# Patient Record
Sex: Male | Born: 1950 | Race: White | Hispanic: No | Marital: Single | Smoking: Former smoker
Health system: Southern US, Community
[De-identification: ages and names within clinical notes are randomized; demographics above are authoritative.]

## PROBLEM LIST (undated history)

## (undated) DIAGNOSIS — E119 Type 2 diabetes mellitus without complications: Secondary | ICD-10-CM

## (undated) DIAGNOSIS — I5020 Unspecified systolic (congestive) heart failure: Secondary | ICD-10-CM

## (undated) DIAGNOSIS — I493 Ventricular premature depolarization: Secondary | ICD-10-CM

## (undated) DIAGNOSIS — I82409 Acute embolism and thrombosis of unspecified deep veins of unspecified lower extremity: Secondary | ICD-10-CM

## (undated) DIAGNOSIS — C109 Malignant neoplasm of oropharynx, unspecified: Secondary | ICD-10-CM

## (undated) DIAGNOSIS — I4729 Other ventricular tachycardia: Secondary | ICD-10-CM

## (undated) DIAGNOSIS — I10 Essential (primary) hypertension: Secondary | ICD-10-CM

## (undated) DIAGNOSIS — E785 Hyperlipidemia, unspecified: Secondary | ICD-10-CM

## (undated) DIAGNOSIS — I513 Intracardiac thrombosis, not elsewhere classified: Secondary | ICD-10-CM

## (undated) DIAGNOSIS — I2510 Atherosclerotic heart disease of native coronary artery without angina pectoris: Secondary | ICD-10-CM

## (undated) HISTORY — DX: Ventricular premature depolarization: I49.3

## (undated) HISTORY — DX: Other ventricular tachycardia: I47.29

## (undated) HISTORY — DX: Atherosclerotic heart disease of native coronary artery without angina pectoris: I25.10

## (undated) HISTORY — DX: Intracardiac thrombosis, not elsewhere classified: I51.3

## (undated) HISTORY — PX: CORONARY ANGIOPLASTY WITH STENT PLACEMENT: SHX49

## (undated) HISTORY — PX: SHOULDER SURGERY: SHX246

## (undated) HISTORY — DX: Acute embolism and thrombosis of unspecified deep veins of unspecified lower extremity: I82.409

## (undated) HISTORY — DX: Unspecified systolic (congestive) heart failure: I50.20

## (undated) HISTORY — PX: OTHER SURGICAL HISTORY: SHX169

---

## 2006-05-18 ENCOUNTER — Ambulatory Visit: Payer: Self-pay | Admitting: Specialist

## 2006-05-24 ENCOUNTER — Other Ambulatory Visit: Payer: Self-pay

## 2006-05-30 ENCOUNTER — Ambulatory Visit: Payer: Self-pay | Admitting: Specialist

## 2014-04-01 DIAGNOSIS — I10 Essential (primary) hypertension: Secondary | ICD-10-CM | POA: Insufficient documentation

## 2014-04-01 DIAGNOSIS — E785 Hyperlipidemia, unspecified: Secondary | ICD-10-CM | POA: Insufficient documentation

## 2016-08-15 ENCOUNTER — Encounter: Payer: Self-pay | Admitting: Intensive Care

## 2016-08-15 ENCOUNTER — Emergency Department
Admission: EM | Admit: 2016-08-15 | Discharge: 2016-08-15 | Disposition: A | Discharge status: Home / Self Care | Payer: Worker's Compensation | Attending: Emergency Medicine | Admitting: Emergency Medicine

## 2016-08-15 DIAGNOSIS — S61012A Laceration without foreign body of left thumb without damage to nail, initial encounter: Secondary | ICD-10-CM | POA: Diagnosis not present

## 2016-08-15 DIAGNOSIS — E119 Type 2 diabetes mellitus without complications: Secondary | ICD-10-CM | POA: Diagnosis not present

## 2016-08-15 DIAGNOSIS — W260XXA Contact with knife, initial encounter: Secondary | ICD-10-CM | POA: Insufficient documentation

## 2016-08-15 DIAGNOSIS — Y9389 Activity, other specified: Secondary | ICD-10-CM | POA: Diagnosis not present

## 2016-08-15 DIAGNOSIS — F17290 Nicotine dependence, other tobacco product, uncomplicated: Secondary | ICD-10-CM | POA: Diagnosis not present

## 2016-08-15 DIAGNOSIS — Y929 Unspecified place or not applicable: Secondary | ICD-10-CM | POA: Diagnosis not present

## 2016-08-15 DIAGNOSIS — Z23 Encounter for immunization: Secondary | ICD-10-CM | POA: Insufficient documentation

## 2016-08-15 DIAGNOSIS — I10 Essential (primary) hypertension: Secondary | ICD-10-CM | POA: Diagnosis not present

## 2016-08-15 DIAGNOSIS — Y990 Civilian activity done for income or pay: Secondary | ICD-10-CM | POA: Diagnosis not present

## 2016-08-15 HISTORY — DX: Essential (primary) hypertension: I10

## 2016-08-15 HISTORY — DX: Type 2 diabetes mellitus without complications: E11.9

## 2016-08-15 HISTORY — DX: Hyperlipidemia, unspecified: E78.5

## 2016-08-15 LAB — GLUCOSE, CAPILLARY: Glucose-Capillary: 162 mg/dL — ABNORMAL HIGH (ref 65–99)

## 2016-08-15 MED ORDER — NAPROXEN 500 MG PO TABS: 500.0000 mg | ORAL_TABLET | Freq: Once | ORAL | Status: DC

## 2016-08-15 MED ORDER — TETANUS-DIPHTH-ACELL PERTUSSIS 5-2.5-18.5 LF-MCG/0.5 IM SUSP
0.5000 mL | Freq: Once | INTRAMUSCULAR | Status: AC
  Administered 2016-08-15: 0.5 mL via INTRAMUSCULAR
  Filled 2016-08-15: qty 0.5

## 2016-08-15 NOTE — ED Triage Notes (Addendum)
Patient reports he was using a knife to cut a band off a pressure washer wand and slipped and cut his L hand. Patient works at [employer] and this will be workers comp. Laceration noted to L hand. Radial pulse bounding and cap refil <3 sec. Patient denies taking any OTC meds and denies pain. A&O x4. NAD noted. Patient reports being on blood thinners

## 2016-08-15 NOTE — ED Notes (Signed)
Spoke Molli Barrows, Production designer, theatre/television/film. Stated that pt would need urine drug screen

## 2016-08-15 NOTE — ED Provider Notes (Signed)
[facility] Emergency Department Provider Note  ____________________________________________  Time seen: Approximately 1:51 PM  I have reviewed the triage vital signs and the nursing notes.   HISTORY  Chief Complaint Extremity Laceration (L hand)    HPI Kenneth Gilmore is a 65 y.o. male resents to the emergency department with a laceration over left hand.The knife slipped at work and cut hand. Patient is on blood thinners and they could not get the bleeding to stop so patient came to the emergency room. Patient is able to move fingers and is not having any sensation changes. Patient has not taken anything for pain. Patient denies any other injuries. Patient does not remember last tetanus shot.   Past Medical History:  Diagnosis Date  . Diabetes mellitus without complication ([place])   . Hyperlipidemia   . Hypertension     There are no active problems to display for this patient.   Past Surgical History:  Procedure Laterality Date  . arm surgery Left     Prior to Admission medications   Not on File    Allergies Patient has no known allergies.  History reviewed. No pertinent family history.  Social History Social History  Substance Use Topics  . Smoking status: Never Smoker  . Smokeless tobacco: Current User    Types: Chew  . Alcohol use No     Review of Systems  Constitutional: No fever/chills Cardiovascular: No chest pain. Respiratory: No SOB. Gastrointestinal: No abdominal pain.  No nausea, no vomiting.  Skin: Negative for rash, ecchymosis. Neurological: Negative for numbness or tingling   ____________________________________________   PHYSICAL EXAM:  VITAL SIGNS: ED Triage Vitals  Enc Vitals Group     BP 08/15/16 1242 130/70     Pulse Rate 08/15/16 1242 74     Resp 08/15/16 1242 18     Temp 08/15/16 1242 98.5 F (36.9 C)     Temp Source 08/15/16 1242 Oral     SpO2 08/15/16 1242 96 %     Weight 08/15/16 1243 235 lb  (106.6 kg)     Height 08/15/16 1243 5\' 10"  (1.778 m)     Head Circumference --      Peak Flow --      Pain Score --      Pain Loc --      Pain Edu? --      Excl. in [place]? --      Constitutional: Alert and oriented. Well appearing and in no acute distress. Eyes: Conjunctivae are normal. PERRL. EOMI. Head: Atraumatic. ENT:      Ears:      Nose: No congestion/rhinnorhea.      Mouth/Throat: Mucous membranes are moist.  Neck: No stridor.   Cardiovascular: Normal rate, regular rhythm. Normal S1 and S2.  Good peripheral circulation. Respiratory: Normal respiratory effort without tachypnea or retractions. Lungs CTAB. Good air entry to the bases with no decreased or absent breath sounds. Musculoskeletal: Full range of motion to all extremities. No gross deformities appreciated. Neurologic:  Normal speech and language. No gross focal neurologic deficits are appreciated.  Skin:  Skin is warm, dry. No rash noted. 1/2 inch shallow laceration over the base of left thumb. No bleeding. Psychiatric: Mood and affect are normal. Speech and behavior are normal. Patient exhibits appropriate insight and judgement.   ____________________________________________   LABS (all labs ordered are listed, but only abnormal results are displayed)  Labs Reviewed  GLUCOSE, CAPILLARY - Abnormal; Notable for the following:  Result Value   Glucose-Capillary 162 (*)    All other components within normal limits   ____________________________________________  EKG   ____________________________________________  RADIOLOGY  No results found.  ____________________________________________    PROCEDURES  Procedure(s) performed:    Procedures  Dermabond and Steri-Strips were applied.  Medications  naproxen (NAPROSYN) tablet 500 mg (500 mg Oral Refused 08/15/16 1435)  Tdap (BOOSTRIX) injection 0.5 mL (0.5 mLs Intramuscular Given 08/15/16 1414)      ____________________________________________   INITIAL IMPRESSION / ASSESSMENT AND PLAN / ED COURSE  Pertinent labs & imaging results that were available during my care of the patient were reviewed by me and considered in my medical decision making (see chart for details).  Review of the [place] CSRS was performed in accordance of the [place] prior to dispensing any controlled drugs.  Clinical Course     Patient's diagnosis is consistent with laceration. Laceration was very shallow so no indication for stitches. Wound was irrigated with normal saline and iodine. Dermabond and Steri-Strips were applied. Wound was wrapped with ace bandage for swelling. Splint was applied to keep laceration from opening. Tetanus shot was updated. Patient is to follow up with PCP as directed. Patient is given ED precautions to return to the ED for any worsening or new symptoms.     ____________________________________________  FINAL CLINICAL IMPRESSION(S) / ED DIAGNOSES  Final diagnoses:  Laceration of left thumb without damage to nail, foreign body presence unspecified, initial encounter      NEW MEDICATIONS STARTED DURING THIS VISIT:  There are no discharge medications for this patient.       This chart was dictated using voice recognition software/Dragon. Despite best efforts to proofread, errors can occur which can change the meaning. Any change was purely unintentional.    Laban Emperor, PA-C 08/15/16 [place], MD 08/15/16 2019

## 2016-08-15 NOTE — ED Notes (Signed)
Pt has small  approx 1 inch laceration to his left hand below his thumb. Pt is on blood thinners. Bleeding is under control at this time.

## 2016-08-15 NOTE — ED Notes (Signed)
UDS for workers comp completed and taken to lab pre policy.

## 2018-02-06 ENCOUNTER — Other Ambulatory Visit: Payer: Self-pay | Admitting: Physician Assistant

## 2018-02-06 ENCOUNTER — Emergency Department
Admission: EM | Admit: 2018-02-06 | Discharge: 2018-02-06 | Disposition: A | Discharge status: Home / Self Care | Payer: Medicare Other | Attending: Emergency Medicine | Admitting: Emergency Medicine

## 2018-02-06 ENCOUNTER — Encounter: Payer: Self-pay | Admitting: Intensive Care

## 2018-02-06 ENCOUNTER — Ambulatory Visit
Admission: RE | Admit: 2018-02-06 | Discharge: 2018-02-06 | Disposition: A | Discharge status: Home / Self Care | Payer: Medicare Other | Source: Ambulatory Visit | Attending: Physician Assistant | Admitting: Physician Assistant

## 2018-02-06 DIAGNOSIS — I82412 Acute embolism and thrombosis of left femoral vein: Secondary | ICD-10-CM | POA: Diagnosis not present

## 2018-02-06 DIAGNOSIS — E119 Type 2 diabetes mellitus without complications: Secondary | ICD-10-CM | POA: Insufficient documentation

## 2018-02-06 DIAGNOSIS — I10 Essential (primary) hypertension: Secondary | ICD-10-CM | POA: Diagnosis not present

## 2018-02-06 DIAGNOSIS — M7989 Other specified soft tissue disorders: Principal | ICD-10-CM

## 2018-02-06 DIAGNOSIS — R2242 Localized swelling, mass and lump, left lower limb: Secondary | ICD-10-CM | POA: Diagnosis present

## 2018-02-06 DIAGNOSIS — M79662 Pain in left lower leg: Secondary | ICD-10-CM

## 2018-02-06 DIAGNOSIS — Z7982 Long term (current) use of aspirin: Secondary | ICD-10-CM | POA: Diagnosis not present

## 2018-02-06 DIAGNOSIS — I824Z2 Acute embolism and thrombosis of unspecified deep veins of left distal lower extremity: Secondary | ICD-10-CM | POA: Insufficient documentation

## 2018-02-06 LAB — CBC WITH DIFFERENTIAL/PLATELET
BASOS ABS: 0.2 10*3/uL — AB (ref 0–0.1)
BASOS PCT: 1 %
EOS ABS: 0.3 10*3/uL (ref 0–0.7)
Eosinophils Relative: 3 %
HCT: 44.8 % (ref 40.0–52.0)
Hemoglobin: 15.4 g/dL (ref 13.0–18.0)
LYMPHS PCT: 30 %
Lymphs Abs: 3.6 10*3/uL (ref 1.0–3.6)
MCH: 32.7 pg (ref 26.0–34.0)
MCHC: 34.4 g/dL (ref 32.0–36.0)
MCV: 95.2 fL (ref 80.0–100.0)
Monocytes Absolute: 1.2 10*3/uL — ABNORMAL HIGH (ref 0.2–1.0)
Monocytes Relative: 10 %
Neutro Abs: 6.8 10*3/uL — ABNORMAL HIGH (ref 1.4–6.5)
Neutrophils Relative %: 56 %
PLATELETS: 229 10*3/uL (ref 150–440)
RBC: 4.7 MIL/uL (ref 4.40–5.90)
RDW: 13.2 % (ref 11.5–14.5)
WBC: 12.1 10*3/uL — AB (ref 3.8–10.6)

## 2018-02-06 LAB — COMPREHENSIVE METABOLIC PANEL
ALBUMIN: 4.1 g/dL (ref 3.5–5.0)
ALT: 19 U/L (ref 17–63)
AST: 20 U/L (ref 15–41)
Alkaline Phosphatase: 60 U/L (ref 38–126)
Anion gap: 12 (ref 5–15)
BUN: 20 mg/dL (ref 6–20)
CHLORIDE: 103 mmol/L (ref 101–111)
CO2: 24 mmol/L (ref 22–32)
CREATININE: 1.04 mg/dL (ref 0.61–1.24)
Calcium: 9.1 mg/dL (ref 8.9–10.3)
GFR calc Af Amer: >60 mL/min (ref >60)
GLUCOSE: 132 mg/dL — AB (ref 65–99)
Potassium: 4.3 mmol/L (ref 3.5–5.1)
SODIUM: 139 mmol/L (ref 135–145)
Total Bilirubin: 0.7 mg/dL (ref 0.3–1.2)
Total Protein: 7.3 g/dL (ref 6.5–8.1)

## 2018-02-06 LAB — APTT: aPTT: 27 seconds (ref 24–36)

## 2018-02-06 LAB — PROTIME-INR
INR: 0.99
PROTHROMBIN TIME: 13 s (ref 11.4–15.2)

## 2018-02-06 MED ORDER — RIVAROXABAN (XARELTO) VTE STARTER PACK (15 & 20 MG): ORAL_TABLET | ORAL | 0 refills | Status: DC

## 2018-02-06 MED ORDER — RIVAROXABAN 15 MG PO TABS
15.0000 mg | ORAL_TABLET | Freq: Once | ORAL | Status: AC
  Administered 2018-02-06: 15 mg via ORAL
  Filled 2018-02-06 (×2): qty 1

## 2018-02-06 NOTE — ED Notes (Signed)
Pt discharged home after verbalizing understanding of discharge instructions; nad noted. 

## 2018-02-06 NOTE — Discharge Instructions (Signed)
Please begin taking your anticoagulation as prescribed and make an appointment to follow-up with the hematologist within 1 week for reevaluation.  Return to the emergency department immediately for any new or worsening symptoms such as chest pain, shortness of breath, or for any other issues whatsoever.  It was a pleasure to take care of you today, and thank you for coming to our emergency department.  If you have any questions or concerns before leaving please ask the nurse to grab me and I'm more than happy to go through your aftercare instructions again.  If you were prescribed any opioid pain medication today such as Norco, Vicodin, Percocet, morphine, hydrocodone, or oxycodone please make sure you do not drive when you are taking this medication as it can alter your ability to drive safely.  If you have any concerns once you are home that you are not improving or are in fact getting worse before you can make it to your follow-up appointment, please do not hesitate to call 911 and come back for further evaluation.  Darel Hong, MD  Results for orders placed or performed during the hospital encounter of 02/06/18  CBC with Differential  Result Value Ref Range   WBC 12.1 (H) 3.8 - 10.6 K/uL   RBC 4.70 4.40 - 5.90 MIL/uL   Hemoglobin 15.4 13.0 - 18.0 g/dL   HCT 44.8 40.0 - 52.0 %   MCV 95.2 80.0 - 100.0 fL   MCH 32.7 26.0 - 34.0 pg   MCHC 34.4 32.0 - 36.0 g/dL   RDW 13.2 11.5 - 14.5 %   Platelets 229 150 - 440 K/uL   Neutrophils Relative % 56 %   Neutro Abs 6.8 (H) 1.4 - 6.5 K/uL   Lymphocytes Relative 30 %   Lymphs Abs 3.6 1.0 - 3.6 K/uL   Monocytes Relative 10 %   Monocytes Absolute 1.2 (H) 0.2 - 1.0 K/uL   Eosinophils Relative 3 %   Eosinophils Absolute 0.3 0 - 0.7 K/uL   Basophils Relative 1 %   Basophils Absolute 0.2 (H) 0 - 0.1 K/uL  Comprehensive metabolic panel  Result Value Ref Range   Sodium 139 135 - 145 mmol/L   Potassium 4.3 3.5 - 5.1 mmol/L   Chloride 103 101 -  111 mmol/L   CO2 24 22 - 32 mmol/L   Glucose, Bld 132 (H) 65 - 99 mg/dL   BUN 20 6 - 20 mg/dL   Creatinine, Ser 1.04 0.61 - 1.24 mg/dL   Calcium 9.1 8.9 - 10.3 mg/dL   Total Protein 7.3 6.5 - 8.1 g/dL   Albumin 4.1 3.5 - 5.0 g/dL   AST 20 15 - 41 U/L   ALT 19 17 - 63 U/L   Alkaline Phosphatase 60 38 - 126 U/L   Total Bilirubin 0.7 0.3 - 1.2 mg/dL   GFR calc non Af Amer >60 >60 mL/min   GFR calc Af Amer >60 >60 mL/min   Anion gap 12 5 - 15  Protime-INR  Result Value Ref Range   Prothrombin Time 13.0 11.4 - 15.2 seconds   INR 0.99   APTT  Result Value Ref Range   aPTT 27 24 - 36 seconds   US Venous Img Lower Unilateral Left  Result Date: 02/06/2018 CLINICAL DATA:  Left leg pain and swelling. EXAM: LEFT LOWER EXTREMITY VENOUS DOPPLER ULTRASOUND TECHNIQUE: Gray-scale sonography with graded compression, as well as color Doppler and duplex ultrasound were performed to evaluate the lower extremity deep venous systems  from the level of the common femoral vein and including the common femoral, femoral, profunda femoral, popliteal and calf veins including the posterior tibial, peroneal and gastrocnemius veins when visible. The superficial great saphenous vein was also interrogated. Spectral Doppler was utilized to evaluate flow at rest and with distal augmentation maneuvers in the common femoral, femoral and popliteal veins. COMPARISON:  None. FINDINGS: Contralateral Common Femoral Vein: No evidence of thrombus. Normal compressibility and color Doppler flow. Common Femoral Vein: No evidence of thrombus. Normal compressibility, respiratory phasicity and response to augmentation. Saphenofemoral Junction: No evidence of thrombus. Normal compressibility and flow on color Doppler imaging. Profunda Femoral Vein: No evidence of thrombus. Normal compressibility and flow on color Doppler imaging. Femoral Vein: Positive for thrombus. The left femoral vein is non compressible and contains echogenic thrombus.  Thrombus appears to be occlusive. Popliteal Vein: Positive for thrombus. Left popliteal vein is non compressible with echogenic thrombus. No significant vascular flow within the popliteal vein. Calf Veins: Positive for thrombus. The peroneal veins and posterior tibial veins are non compressible and contain echogenic thrombus. Evidence for thrombus within the gastrocnemius veins. Other Findings:  None. IMPRESSION: Positive for deep venous thrombosis in the left lower extremity. Occlusive thrombus involving the left femoral vein, left popliteal vein and visualized left deep calf veins. Electronically Signed   By: Markus Daft M.D.   On: 02/06/2018 15:42

## 2018-02-06 NOTE — ED Triage Notes (Signed)
Patient was brought over from [place] for positive blood clot present in L leg. Patient was being seen out patient at the time.

## 2018-02-06 NOTE — ED Notes (Signed)
Pt from outpt ultrasound with blood clot in left leg. Pain 2/10.

## 2018-02-06 NOTE — ED Provider Notes (Signed)
[facility] Emergency Department Provider Note  ____________________________________________   First MD Initiated Contact with Patient 02/06/18 1712     (approximate)  I have reviewed the triage vital signs and the nursing notes.   HISTORY  Chief Complaint No chief complaint on file.   HPI Kenneth Gilmore is a 67 y.o. male who sent to the emergency department after having an outpatient ultrasound of his left lower extremity which was positive for deep vein thrombosis.  He has no previous history of DVT or pulmonary embolism.  No recent surgery travel or immobilization.  No recent trauma.  He takes a baby aspirin a day but no blood thinning medication and never has.  He has had swelling to his left leg for the past week or so.  Also associated with moderate severity throbbing aching discomfort.  Nothing seems to make the pain better or worse.  Past Medical History:  Diagnosis Date  . Diabetes mellitus without complication ([place])   . Hyperlipidemia   . Hypertension     There are no active problems to display for this patient.   Past Surgical History:  Procedure Laterality Date  . arm surgery Left     Prior to Admission medications   Medication Sig Start Date End Date Taking? Authorizing Provider  Rivaroxaban 15 & 20 MG TBPK Take as directed on package: Start with one 15mg  tablet by mouth twice a day with food. On Day 22, switch to one 20mg  tablet once a day with food. 02/06/18   Darel Hong, MD    Allergies Patient has no known allergies.  History reviewed. No pertinent family history.  Social History Social History   Tobacco Use  . Smoking status: Never Smoker  . Smokeless tobacco: Current User    Types: Chew  Substance Use Topics  . Alcohol use: No  . Drug use: Never    Review of Systems Constitutional: No fever/chills ENT: No sore throat. Cardiovascular: Denies chest pain. Respiratory: Denies shortness of  breath. Gastrointestinal: No abdominal pain.  No nausea, no vomiting.  No diarrhea.  No constipation. Musculoskeletal: Positive for leg pain Neurological: Negative for headaches   ____________________________________________   PHYSICAL EXAM:  VITAL SIGNS: ED Triage Vitals  Enc Vitals Group     BP 02/06/18 1606 123/68     Pulse Rate 02/06/18 1606 78     Resp 02/06/18 1606 16     Temp 02/06/18 1606 98.3 F (36.8 C)     Temp Source 02/06/18 1606 Oral     SpO2 02/06/18 1606 96 %     Weight 02/06/18 1607 225 lb (102.1 kg)     Height 02/06/18 1607 5\' 10"  (1.778 m)     Head Circumference --      Peak Flow --      Pain Score 02/06/18 1607 2     Pain Loc --      Pain Edu? --      Excl. in [place]? --     Constitutional: Alert and oriented x4 well-appearing nontoxic no diaphoresis speaks full clear sentences Head: Atraumatic. Nose: No congestion/rhinnorhea. Mouth/Throat: No trismus Neck: No stridor.   Cardiovascular: Regular rate and rhythm Respiratory: Normal respiratory effort.  No retractions. MSK: Left leg slightly swollen.  2+ dorsalis pedis pulse.  Compartments soft. Neurologic:  Normal speech and language. No gross focal neurologic deficits are appreciated.  Skin:  Skin is warm, dry and intact. No rash noted.    ____________________________________________  LABS (all labs  ordered are listed, but only abnormal results are displayed)  Labs Reviewed  CBC WITH DIFFERENTIAL/PLATELET - Abnormal; Notable for the following components:      Result Value   WBC 12.1 (*)    Neutro Abs 6.8 (*)    Monocytes Absolute 1.2 (*)    Basophils Absolute 0.2 (*)    All other components within normal limits  COMPREHENSIVE METABOLIC PANEL - Abnormal; Notable for the following components:   Glucose, Bld 132 (*)    All other components within normal limits  PROTIME-INR  APTT    Lab work reviewed by me with no renal  dysfunction __________________________________________  EKG   ____________________________________________  RADIOLOGY  Ultrasound of the left lower extremity reviewed by me with deep vein thrombosis ____________________________________________   DIFFERENTIAL includes but not limited to  DVT, phlegmasia cerulea dolens, pulmonary embolism   PROCEDURES  Procedure(s) performed: no  Procedures  Critical Care performed: no  Observation: no ____________________________________________   INITIAL IMPRESSION / ASSESSMENT AND PLAN / ED COURSE  Pertinent labs & imaging results that were available during my care of the patient were reviewed by me and considered in my medical decision making (see chart for details).  Patient arrives hemodynamically stable and well-appearing.  He does have a large DVT on the left.  He has no signs of phlegmasia.  No chest pain or shortness of breath and I doubt pulmonary embolism.  This appears to be potentially an unprovoked DVT so I did warn the patient that he could require prolonged anticoagulation or potentially for life.  I checked his insurance and it appears to cover rivaroxaban.  Given a first dose now and I will discharge him with a starter pack and heme-onc follow-up.  Strict return precautions have been given and the patient verbalizes understanding agreement with plan.      ____________________________________________   FINAL CLINICAL IMPRESSION(S) / ED DIAGNOSES  Final diagnoses:  Acute deep vein thrombosis (DVT) of femoral vein of left lower extremity ([place])      NEW MEDICATIONS STARTED DURING THIS VISIT:  Discharge Medication List as of 02/06/2018  5:26 PM    START taking these medications   Details  Rivaroxaban 15 & 20 MG TBPK Take as directed on package: Start with one 15mg  tablet by mouth twice a day with food. On Day 22, switch to one 20mg  tablet once a day with food., Print         Note:  This document was prepared  using Dragon voice recognition software and may include unintentional dictation errors.      Darel Hong, MD 02/07/18 1026

## 2018-02-10 ENCOUNTER — Other Ambulatory Visit: Payer: Self-pay

## 2018-02-10 ENCOUNTER — Encounter: Payer: Self-pay | Admitting: Oncology

## 2018-02-10 ENCOUNTER — Inpatient Hospital Stay: Payer: Medicare Other | Attending: Oncology | Admitting: Oncology

## 2018-02-10 VITALS — BP 108/70 | HR 65 | Temp 97.1°F | Resp 18 | Ht 71.0 in | Wt 224.3 lb

## 2018-02-10 DIAGNOSIS — Z955 Presence of coronary angioplasty implant and graft: Secondary | ICD-10-CM | POA: Insufficient documentation

## 2018-02-10 DIAGNOSIS — I82412 Acute embolism and thrombosis of left femoral vein: Secondary | ICD-10-CM

## 2018-02-10 DIAGNOSIS — Z7901 Long term (current) use of anticoagulants: Secondary | ICD-10-CM | POA: Diagnosis not present

## 2018-02-10 DIAGNOSIS — D72829 Elevated white blood cell count, unspecified: Secondary | ICD-10-CM | POA: Insufficient documentation

## 2018-02-10 NOTE — Progress Notes (Signed)
Patient here today for initial evaluation.  

## 2018-02-10 NOTE — Progress Notes (Signed)
Hematology/Oncology Consult note [facility] [phone] [phone]   Patient Care Team: Georgiana Shore., PA-C as PCP - General  REFERRING PROVIDER: Emergency Room Physician  CHIEF COMPLAINTS/REASON FOR VISIT:  Evaluation of DVT  HISTORY OF PRESENTING ILLNESS:  Kenneth Gilmore is a  67 y.o.  male with PMH listed below who was referred to me for evaluation of DVT. Patient presented outpatient ultrasound of his left lower extremity showing positive for DVT. Denies any recent,, surgery, travel or issue.  Denies any family history of DVT.  Reports that he developed left lower extremity swelling for a week, associated with moderate severity throbbing aching discomfort feels like pulling a muscle.  Denies any shortness of breath. Patient was started on Xarelto starting kit and advised to follow-up with heme-onc.  Today patient reports being compliant with taking Xarelto.  Currently taking 15 mg twice daily. Tolerates well.Denies hematochezia, hematuria, hematemesis, epistaxis, black tarry stool or easy bruising.    Review of Systems  Constitutional: Negative for chills, fever, malaise/fatigue and weight loss.  HENT: Negative for congestion, ear discharge, ear pain, nosebleeds, sinus pain and sore throat.   Eyes: Negative for double vision, photophobia, pain, discharge and redness.  Respiratory: Negative for cough, hemoptysis, sputum production, shortness of breath and wheezing.   Cardiovascular: Negative for chest pain, palpitations, orthopnea, claudication and leg swelling.  Gastrointestinal: Negative for abdominal pain, blood in stool, constipation, diarrhea, heartburn, melena, nausea and vomiting.  Genitourinary: Negative for dysuria, flank pain, frequency and hematuria.  Musculoskeletal: Negative for back pain, myalgias and neck pain.  Skin: Negative for itching and rash.  Neurological: Negative for dizziness, tingling, tremors, focal  weakness, weakness and headaches.  Endo/Heme/Allergies: Negative for environmental allergies. Does not bruise/bleed easily.  Psychiatric/Behavioral: Negative for depression and hallucinations. The patient is not nervous/anxious.     MEDICAL HISTORY:  Past Medical History:  Diagnosis Date  . Diabetes mellitus without complication ([place])   . Hyperlipidemia   . Hypertension     SURGICAL HISTORY: Past Surgical History:  Procedure Laterality Date  . arm surgery Left   . CORONARY ANGIOPLASTY WITH STENT PLACEMENT    . left side rotator cuff surgery    . SHOULDER SURGERY      SOCIAL HISTORY: Social History   Socioeconomic History  . Marital status: Single    Spouse name: Not on file  . Number of children: Not on file  . Years of education: Not on file  . Highest education level: Not on file  Occupational History  . Occupation: retired  Scientific laboratory technician  . Financial resource strain: Not on file  . Food insecurity:    Worry: Not on file    Inability: Not on file  . Transportation needs:    Medical: Not on file    Non-medical: Not on file  Tobacco Use  . Smoking status: Never Smoker  . Smokeless tobacco: Current User    Types: Chew  Substance and Sexual Activity  . Alcohol use: No  . Drug use: Never  . Sexual activity: Not on file  Lifestyle  . Physical activity:    Days per week: Not on file    Minutes per session: Not on file  . Stress: Not on file  Relationships  . Social connections:    Talks on phone: Not on file    Gets together: Not on file    Attends religious service: Not on file    Active member of club or organization:  Not on file    Attends meetings of clubs or organizations: Not on file    Relationship status: Not on file  . Intimate partner violence:    Fear of current or ex partner: Not on file    Emotionally abused: Not on file    Physically abused: Not on file    Forced sexual activity: Not on file  Other Topics Concern  . Not on file  Social  History Narrative  . Not on file    FAMILY HISTORY: Family History  Problem Relation Age of Onset  . Cancer Father        lung    ALLERGIES:  has No Known Allergies.  MEDICATIONS:  Current Outpatient Medications  Medication Sig Dispense Refill  . aspirin EC 325 MG tablet Take by mouth. Take 381m by mouth once daily    . lisinopril (PRINIVIL,ZESTRIL) 5 MG tablet Take by mouth. Take 1 tab by mouth once daily    . metFORMIN (GLUCOPHAGE) 500 MG tablet Take 500 mg by mouth 2 (two) times daily.    . metoprolol tartrate (LOPRESSOR) 50 MG tablet Take 50 mg by mouth 2 (two) times daily.    . Rivaroxaban 15 & 20 MG TBPK Take as directed on package: Start with one 126mtablet by mouth twice a day with food. On Day 22, switch to one 2017mablet once a day with food. 51 each 0  . simvastatin (ZOCOR) 40 MG tablet Take 40 mg by mouth daily.     No current facility-administered medications for this visit.      PHYSICAL EXAMINATION: ECOG PERFORMANCE STATUS: 0 - Asymptomatic Vitals:   02/10/18 1041  BP: 108/70  Pulse: 65  Resp: 18  Temp: (!) 97.1 F (36.2 C)   Filed Weights   02/10/18 1041  Weight: 224 lb 4.8 oz (101.7 kg)    Physical Exam  Constitutional: He is oriented to person, place, and time. He appears well-developed and well-nourished. No distress.  HENT:  Head: Normocephalic and atraumatic.  Right Ear: External ear normal.  Left Ear: External ear normal.  Mouth/Throat: Oropharynx is clear and moist.  Eyes: Pupils are equal, round, and reactive to light. Conjunctivae and EOM are normal. No scleral icterus.  Neck: Normal range of motion. Neck supple.  Cardiovascular: Normal rate, regular rhythm and normal heart sounds.  Pulmonary/Chest: Effort normal and breath sounds normal. No respiratory distress. He has no wheezes. He has no rales. He exhibits no tenderness.  Abdominal: Soft. Bowel sounds are normal. He exhibits no distension and no mass. There is no tenderness.    Musculoskeletal: Normal range of motion. He exhibits no deformity.  Left lower extremity +1 edema  Lymphadenopathy:    He has no cervical adenopathy.  Neurological: He is alert and oriented to person, place, and time. No cranial nerve deficit. Coordination normal.  Skin: Skin is warm and dry. No rash noted.  Psychiatric: He has a normal mood and affect. His behavior is normal. Thought content normal.     LABORATORY DATA:  I have reviewed the data as listed Lab Results  Component Value Date   WBC 12.1 (H) 02/06/2018   HGB 15.4 02/06/2018   HCT 44.8 02/06/2018   MCV 95.2 02/06/2018   PLT 229 02/06/2018   Recent Labs    02/06/18 1620  NA 139  K 4.3  CL 103  CO2 24  GLUCOSE 132*  BUN 20  CREATININE 1.04  CALCIUM 9.1  GFRNONAA >60  GFRAA >60  PROT 7.3  ALBUMIN 4.1  AST 20  ALT 19  ALKPHOS 60  BILITOT 0.7   Iron/TIBC/Ferritin/ %Sat No results found for: IRON, TIBC, FERRITIN, IRONPCTSAT      ASSESSMENT & PLAN:  1. Acute deep vein thrombosis (DVT) of femoral vein of left lower extremity (HCC)    Ultrasound of left lower extremity was and discussed with patient.  Ultrasound showed deep vein thrombosis in left lower extremity, occlusive thrombus involving the left femoral, left popliteal and visualized calf veins. He has a proximal lower extremity.  I agree with patient with Xarelto.  Discussed with patient that given that it is an unprovoked DVT, Recommend duration of active elevation at long time.  Patient voices understanding and agreed with the plan.  He reports also taking aspirin.  He has remote history of coronary angioplasty with stent placement in 2000. I suggest patient be can stop taking aspirin since he is now on therapeutic dose of Xarelto. Also advised patient to ask cardiologist and clarify.  Will not do any hypercoagulable work-up in acute setting and results of hypercoagulable work-up does not change his management plan. I will order factor V Leiden  mutation and prothrombin gene mutation at the next visit.  Reviewed patient's CBC on 02/06/2018.  He has mild leukocytosis with a WBC of 12.1, predominantly neutrophilia 0.8 ANC, basophilia 0.2 We will continue to monitor.  Repeat CBC in 3 months.   Orders Placed This Encounter  Procedures  . CBC with Differential/Platelet    Standing Status:   Future    Standing Expiration Date:   02/11/2019  . Comprehensive metabolic panel    Standing Status:   Future    Standing Expiration Date:   02/11/2019  . Factor 5 leiden    Standing Status:   Future    Standing Expiration Date:   02/11/2019  . Prothrombin gene mutation    Standing Status:   Future    Standing Expiration Date:   02/11/2019    All questions were answered. The patient knows to call the clinic with any problems questions or concerns.  Return of visit:  Thank you for this kind referral and the opportunity to participate in the care of this patient. A copy of today's note is routed to referring provider  Total face to face encounter time for this patient visit was 45 min. >50% of the time was  spent in counseling and coordination of care.    Earlie Server, MD, PhD Hematology Oncology [facility] at [facility] Pager- [phone] 02/10/2018

## 2018-05-11 ENCOUNTER — Other Ambulatory Visit: Payer: Self-pay

## 2018-05-11 ENCOUNTER — Inpatient Hospital Stay (HOSPITAL_BASED_OUTPATIENT_CLINIC_OR_DEPARTMENT_OTHER): Payer: Medicare Other | Admitting: Oncology

## 2018-05-11 ENCOUNTER — Encounter: Payer: Self-pay | Admitting: Oncology

## 2018-05-11 ENCOUNTER — Inpatient Hospital Stay: Payer: Medicare Other | Attending: Oncology

## 2018-05-11 VITALS — BP 114/76 | HR 69 | Temp 97.8°F | Resp 18 | Wt 223.0 lb

## 2018-05-11 DIAGNOSIS — Z7982 Long term (current) use of aspirin: Secondary | ICD-10-CM

## 2018-05-11 DIAGNOSIS — Z86718 Personal history of other venous thrombosis and embolism: Secondary | ICD-10-CM

## 2018-05-11 DIAGNOSIS — D72829 Elevated white blood cell count, unspecified: Secondary | ICD-10-CM

## 2018-05-11 DIAGNOSIS — I82412 Acute embolism and thrombosis of left femoral vein: Secondary | ICD-10-CM

## 2018-05-11 DIAGNOSIS — Z7901 Long term (current) use of anticoagulants: Secondary | ICD-10-CM

## 2018-05-11 DIAGNOSIS — I2510 Atherosclerotic heart disease of native coronary artery without angina pectoris: Secondary | ICD-10-CM | POA: Insufficient documentation

## 2018-05-11 LAB — CBC WITH DIFFERENTIAL/PLATELET
BASOS PCT: 1 %
Basophils Absolute: 0.1 10*3/uL (ref 0–0.1)
EOS ABS: 0.2 10*3/uL (ref 0–0.7)
EOS PCT: 2 %
HCT: 45.9 % (ref 40.0–52.0)
HEMOGLOBIN: 15.8 g/dL (ref 13.0–18.0)
Lymphocytes Relative: 39 %
Lymphs Abs: 3.9 10*3/uL — ABNORMAL HIGH (ref 1.0–3.6)
MCH: 32.6 pg (ref 26.0–34.0)
MCHC: 34.4 g/dL (ref 32.0–36.0)
MCV: 94.8 fL (ref 80.0–100.0)
Monocytes Absolute: 0.8 10*3/uL (ref 0.2–1.0)
Monocytes Relative: 8 %
NEUTROS PCT: 50 %
Neutro Abs: 5.1 10*3/uL (ref 1.4–6.5)
PLATELETS: 252 10*3/uL (ref 150–440)
RBC: 4.84 MIL/uL (ref 4.40–5.90)
RDW: 13.4 % (ref 11.5–14.5)
WBC: 10.1 10*3/uL (ref 3.8–10.6)

## 2018-05-11 LAB — COMPREHENSIVE METABOLIC PANEL
ALK PHOS: 45 U/L (ref 38–126)
ALT: 19 U/L (ref 0–44)
ANION GAP: 11 (ref 5–15)
AST: 23 U/L (ref 15–41)
Albumin: 4.3 g/dL (ref 3.5–5.0)
BILIRUBIN TOTAL: 0.5 mg/dL (ref 0.3–1.2)
BUN: 15 mg/dL (ref 8–23)
CO2: 26 mmol/L (ref 22–32)
CREATININE: 0.96 mg/dL (ref 0.61–1.24)
Calcium: 9 mg/dL (ref 8.9–10.3)
Chloride: 103 mmol/L (ref 98–111)
GFR calc Af Amer: >60 mL/min (ref >60)
Glucose, Bld: 113 mg/dL — ABNORMAL HIGH (ref 70–99)
POTASSIUM: 4 mmol/L (ref 3.5–5.1)
Sodium: 140 mmol/L (ref 135–145)
Total Protein: 7 g/dL (ref 6.5–8.1)

## 2018-05-11 NOTE — Progress Notes (Signed)
Hematology/Oncology follow up  note [facility] Telephone:([phone] [phone]) [phone]   Patient Care Team: Georgiana Shore., PA-C as PCP - General  REFERRING PROVIDER: Emergency Room Physician  REASON FOR VISIT Follow up for treatment of  DVT and chronic anticoagulation  HISTORY OF PRESENTING ILLNESS:  Kenneth Gilmore is a  67 y.o.  male with PMH listed below who was referred to me for evaluation of DVT. Patient presented outpatient ultrasound of his left lower extremity showing positive for DVT. Denies any recent,, surgery, travel or issue.  Denies any family history of DVT.  Reports that he developed left lower extremity swelling for a week, associated with moderate severity throbbing aching discomfort feels like pulling a muscle.  Denies any shortness of breath. Patient was started on Xarelto starting kit and advised to follow-up with heme-onc.  Today patient reports being compliant with taking Xarelto.  Currently taking 15 mg twice daily. Tolerates well.Denies hematochezia, hematuria, hematemesis, epistaxis, black tarry stool or easy bruising.   INTERVAL HISTORY Kenneth Gilmore is a 67 y.o. male who has above history reviewed by me today presents for follow up visit for management of DVT and chronic anticoagulation.  Given that he has an unprovoked proximal lower extremity DVT, it was discussed that he needs long-term anticoagulation. Reports tolerating anticoagulation well.Denies hematochezia, hematuria, hematemesis, epistaxis, black tarry stool or easy bruising.  Left lower extremity swelling has improved, still has mild edema, worst at the end of the day.  No aggravating factors.  Review of Systems  Constitutional: Negative for chills, fever, malaise/fatigue and weight loss.  HENT: Negative for congestion, ear discharge, ear pain, nosebleeds, sinus pain and sore throat.   Eyes: Negative for double vision, photophobia, pain, discharge and redness.    Respiratory: Negative for cough, hemoptysis, sputum production, shortness of breath and wheezing.   Cardiovascular: Negative for chest pain, palpitations, orthopnea, claudication and leg swelling.  Gastrointestinal: Negative for abdominal pain, blood in stool, constipation, diarrhea, heartburn, melena, nausea and vomiting.  Genitourinary: Negative for dysuria, flank pain, frequency and hematuria.  Musculoskeletal: Negative for back pain, myalgias and neck pain.  Skin: Negative for itching and rash.  Neurological: Negative for dizziness, tingling, tremors, focal weakness, weakness and headaches.  Endo/Heme/Allergies: Negative for environmental allergies. Does not bruise/bleed easily.  Psychiatric/Behavioral: Negative for depression and hallucinations. The patient is not nervous/anxious.     MEDICAL HISTORY:  Past Medical History:  Diagnosis Date  . Diabetes mellitus without complication ([place])   . Hyperlipidemia   . Hypertension     SURGICAL HISTORY: Past Surgical History:  Procedure Laterality Date  . arm surgery Left   . CORONARY ANGIOPLASTY WITH STENT PLACEMENT    . left side rotator cuff surgery    . SHOULDER SURGERY      SOCIAL HISTORY: Social History   Socioeconomic History  . Marital status: Single    Spouse name: Not on file  . Number of children: Not on file  . Years of education: Not on file  . Highest education level: Not on file  Occupational History  . Occupation: retired  Scientific laboratory technician  . Financial resource strain: Not on file  . Food insecurity:    Worry: Not on file    Inability: Not on file  . Transportation needs:    Medical: Not on file    Non-medical: Not on file  Tobacco Use  . Smoking status: Never Smoker  . Smokeless tobacco: Current User    Types: Chew  Substance and Sexual Activity  . Alcohol use: No  . Drug use: Never  . Sexual activity: Not on file  Lifestyle  . Physical activity:    Days per week: Not on file    Minutes per session:  Not on file  . Stress: Not on file  Relationships  . Social connections:    Talks on phone: Not on file    Gets together: Not on file    Attends religious service: Not on file    Active member of club or organization: Not on file    Attends meetings of clubs or organizations: Not on file    Relationship status: Not on file  . Intimate partner violence:    Fear of current or ex partner: Not on file    Emotionally abused: Not on file    Physically abused: Not on file    Forced sexual activity: Not on file  Other Topics Concern  . Not on file  Social History Narrative  . Not on file    FAMILY HISTORY: Family History  Problem Relation Age of Onset  . Cancer Father        lung    ALLERGIES:  has No Known Allergies.  MEDICATIONS:  Current Outpatient Medications  Medication Sig Dispense Refill  . aspirin EC 325 MG tablet Take by mouth. Take 372m by mouth once daily    . lisinopril (PRINIVIL,ZESTRIL) 5 MG tablet Take by mouth. Take 1 tab by mouth once daily    . metFORMIN (GLUCOPHAGE) 500 MG tablet Take 500 mg by mouth 2 (two) times daily.    . metoprolol tartrate (LOPRESSOR) 50 MG tablet Take 50 mg by mouth 2 (two) times daily.    . Rivaroxaban 15 & 20 MG TBPK Take as directed on package: Start with one 191mtablet by mouth twice a day with food. On Day 22, switch to one 2050mablet once a day with food. 51 each 0  . simvastatin (ZOCOR) 40 MG tablet Take 40 mg by mouth daily.     No current facility-administered medications for this visit.      PHYSICAL EXAMINATION: ECOG PERFORMANCE STATUS: 0 - Asymptomatic Vitals:   05/11/18 1339  BP: 114/76  Pulse: 69  Resp: 18  Temp: 97.8 F (36.6 C)   Filed Weights   05/11/18 1339  Weight: 223 lb (101.2 kg)    Physical Exam  Constitutional: He is oriented to person, place, and time. He appears well-nourished. No distress.  HENT:  Head: Normocephalic and atraumatic.  Mouth/Throat: Oropharynx is clear and moist.  Eyes:  Pupils are equal, round, and reactive to light. Conjunctivae and EOM are normal. No scleral icterus.  Neck: Normal range of motion. Neck supple.  Cardiovascular: Normal rate, regular rhythm and normal heart sounds.  Pulmonary/Chest: Effort normal and breath sounds normal. No respiratory distress. He has no wheezes. He has no rales. He exhibits no tenderness.  Abdominal: Soft. Bowel sounds are normal. He exhibits no distension and no mass. There is no tenderness.  Musculoskeletal: Normal range of motion. He exhibits no edema or deformity.  Left lower extremity 1+ edema  Lymphadenopathy:    He has no cervical adenopathy.  Neurological: He is alert and oriented to person, place, and time. No cranial nerve deficit. Coordination normal.  Skin: Skin is warm and dry. No rash noted. No erythema.  Psychiatric: He has a normal mood and affect. His behavior is normal. Thought content normal.     LABORATORY DATA:  I have reviewed the data as listed Lab Results  Component Value Date   WBC 12.1 (H) 02/06/2018   HGB 15.4 02/06/2018   HCT 44.8 02/06/2018   MCV 95.2 02/06/2018   PLT 229 02/06/2018   Recent Labs    02/06/18 1620  NA 139  K 4.3  CL 103  CO2 24  GLUCOSE 132*  BUN 20  CREATININE 1.04  CALCIUM 9.1  GFRNONAA >60  GFRAA >60  PROT 7.3  ALBUMIN 4.1  AST 20  ALT 19  ALKPHOS 60  BILITOT 0.7   Iron/TIBC/Ferritin/ %Sat No results found for: IRON, TIBC, FERRITIN, IRONPCTSAT      ASSESSMENT & PLAN:  1. History of femoral vein thrombosis   2. Leukocytosis, unspecified type   3. Chronic anticoagulation    #Recent history of femoral vein thrombosis unprovoked events. Tolerates anticoagulation well.  Continue Xarelto 20 mg daily. Offered patient to refill his prescription.  Patient tells me that he has been getting his Xarelto through [place] system.  #history of CAD status post percutaneous coronary angioplasty.  Patient had been on aspirin 325 mg.  Patient was seen by Dr. Ubaldo Glassing  in July 2019.  Dr. Ubaldo Glassing prefers patient to continue with aspirin despite being on Xarelto.  Patient appears little confused about the instructions of aspirin  I have talked with Dr.Fath over the phone and discussed his case. Dr.Fath prefers patient to stay on Aspirin, dose can be lowered to 28m daily. RN/CMA will call patient to clarify.    #He has mild leukocytosis in June 2019.  Repeat CBC was obtained today. Labs reviewed and discussed with patient.  Leukocytosis has resolved.  Total white count 10.1.  Absolute lymphocytes 3.9 mildly elevated.  Continue monitor.  Repeat CBC in 3 months.   Orders Placed This Encounter  Procedures  . CBC with Differential/Platelet    Standing Status:   Future    Standing Expiration Date:   05/12/2019  . Creatinine, serum    Standing Status:   Future    Standing Expiration Date:   05/12/2019    All questions were answered. The patient knows to call the clinic with any problems questions or concerns.  Return of visit: 3 months. Total face to face encounter time for this patient visit was 81 min. >50% of the time was  spent in counseling and coordination of care.   ZEarlie Server MD, PhD Hematology On[facility]ager- [phone]/19/2019

## 2018-05-11 NOTE — Progress Notes (Signed)
Patient here for follow up, no concerns voiced

## 2018-05-17 LAB — FACTOR 5 LEIDEN

## 2018-05-18 LAB — PROTHROMBIN GENE MUTATION

## 2018-08-10 ENCOUNTER — Other Ambulatory Visit: Payer: Self-pay

## 2018-08-10 ENCOUNTER — Encounter: Payer: Self-pay | Admitting: Oncology

## 2018-08-10 ENCOUNTER — Inpatient Hospital Stay (HOSPITAL_BASED_OUTPATIENT_CLINIC_OR_DEPARTMENT_OTHER): Payer: Medicare Other | Admitting: Oncology

## 2018-08-10 ENCOUNTER — Inpatient Hospital Stay: Payer: Medicare Other | Attending: Oncology

## 2018-08-10 VITALS — BP 112/73 | HR 70 | Temp 97.2°F | Resp 18 | Wt 218.5 lb

## 2018-08-10 DIAGNOSIS — Z79899 Other long term (current) drug therapy: Secondary | ICD-10-CM

## 2018-08-10 DIAGNOSIS — E119 Type 2 diabetes mellitus without complications: Secondary | ICD-10-CM

## 2018-08-10 DIAGNOSIS — Z7982 Long term (current) use of aspirin: Secondary | ICD-10-CM

## 2018-08-10 DIAGNOSIS — I10 Essential (primary) hypertension: Secondary | ICD-10-CM | POA: Diagnosis not present

## 2018-08-10 DIAGNOSIS — Z86718 Personal history of other venous thrombosis and embolism: Secondary | ICD-10-CM

## 2018-08-10 DIAGNOSIS — Z7984 Long term (current) use of oral hypoglycemic drugs: Secondary | ICD-10-CM | POA: Insufficient documentation

## 2018-08-10 DIAGNOSIS — Z7901 Long term (current) use of anticoagulants: Secondary | ICD-10-CM

## 2018-08-10 DIAGNOSIS — D72829 Elevated white blood cell count, unspecified: Secondary | ICD-10-CM

## 2018-08-10 LAB — CBC WITH DIFFERENTIAL/PLATELET
Abs Immature Granulocytes: 0.04 10*3/uL (ref 0.00–0.07)
BASOS PCT: 1 %
Basophils Absolute: 0.1 10*3/uL (ref 0.0–0.1)
EOS PCT: 2 %
Eosinophils Absolute: 0.2 10*3/uL (ref 0.0–0.5)
HCT: 47.4 % (ref 39.0–52.0)
HEMOGLOBIN: 16 g/dL (ref 13.0–17.0)
Immature Granulocytes: 0 %
LYMPHS PCT: 38 %
Lymphs Abs: 3.9 10*3/uL (ref 0.7–4.0)
MCH: 31 pg (ref 26.0–34.0)
MCHC: 33.8 g/dL (ref 30.0–36.0)
MCV: 91.9 fL (ref 80.0–100.0)
Monocytes Absolute: 0.9 10*3/uL (ref 0.1–1.0)
Monocytes Relative: 9 %
Neutro Abs: 5.2 10*3/uL (ref 1.7–7.7)
Neutrophils Relative %: 50 %
Platelets: 249 10*3/uL (ref 150–400)
RBC: 5.16 MIL/uL (ref 4.22–5.81)
RDW: 12.2 % (ref 11.5–15.5)
WBC: 10.3 10*3/uL (ref 4.0–10.5)
nRBC: 0 % (ref 0.0–0.2)

## 2018-08-10 LAB — CREATININE, SERUM
CREATININE: 1.01 mg/dL (ref 0.61–1.24)
GFR calc Af Amer: >60 mL/min (ref >60)
GFR calc non Af Amer: >60 mL/min (ref >60)

## 2018-08-10 NOTE — Progress Notes (Signed)
Patient here for follow up. No concerns voiced.  °

## 2018-08-12 NOTE — Progress Notes (Signed)
Hematology/Oncology follow up  note [facility] [phone] [phone]   Patient Care Team: Georgiana Shore., PA-C as PCP - General  REFERRING PROVIDER: Emergency Room Physician  REASON FOR VISIT Follow up for treatment of  DVT and chronic anticoagulation  HISTORY OF PRESENTING ILLNESS:  Kenneth Gilmore is a  67 y.o.  male with PMH listed below who was referred to me for evaluation of DVT. Patient presented outpatient ultrasound of his left lower extremity showing positive for DVT. Denies any recent,, surgery, travel or issue.  Denies any family history of DVT.  Reports that he developed left lower extremity swelling for a week, associated with moderate severity throbbing aching discomfort feels like pulling a muscle.  Denies any shortness of breath. Patient was started on Xarelto starting kit and advised to follow-up with heme-onc.  Today patient reports being compliant with taking Xarelto.  Currently taking 15 mg twice daily. Tolerates well.Denies hematochezia, hematuria, hematemesis, epistaxis, black tarry stool or easy bruising.   INTERVAL HISTORY Kenneth Gilmore is a 67 y.o. 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   He has finished 6 months of anticoagulation with Xarelto 20 mg daily.  Tolerates well. Denies hematochezia, hematuria, hematemesis, epistaxis, black tarry stool or easy bruising.  Lower extremity swelling has resolved.  Review of Systems  Constitutional: Negative for chills, fever, malaise/fatigue and weight loss.  HENT: Negative for sore throat.   Eyes: Negative for redness.  Respiratory: Negative for cough, shortness of breath and wheezing.   Cardiovascular: Negative for chest pain, palpitations and leg swelling.  Gastrointestinal: Negative for abdominal pain, blood in stool, nausea and  vomiting.  Genitourinary: Negative for dysuria.  Musculoskeletal: Negative for myalgias.  Skin: Negative for rash.  Neurological: Negative for dizziness, tingling and tremors.  Endo/Heme/Allergies: Does not bruise/bleed easily.  Psychiatric/Behavioral: Negative for hallucinations.    MEDICAL HISTORY:  Past Medical History:  Diagnosis Date  . Diabetes mellitus without complication ([place])   . Hyperlipidemia   . Hypertension     SURGICAL HISTORY: Past Surgical History:  Procedure Laterality Date  . arm surgery Left   . CORONARY ANGIOPLASTY WITH STENT PLACEMENT    . left side rotator cuff surgery    . SHOULDER SURGERY      SOCIAL HISTORY: Social History   Socioeconomic History  . Marital status: Single    Spouse name: Not on file  . Number of children: Not on file  . Years of education: Not on file  . Highest education level: Not on file  Occupational History  . Occupation: retired  Scientific laboratory technician  . Financial resource strain: Not on file  . Food insecurity:    Worry: Not on file    Inability: Not on file  . Transportation needs:    Medical: Not on file    Non-medical: Not on file  Tobacco Use  . Smoking status: Never Smoker  . Smokeless tobacco: Current User    Types: Chew  Substance and Sexual Activity  . Alcohol use: No  . Drug use: Never  . Sexual activity: Not on file  Lifestyle  . Physical activity:    Days per week: Not on file    Minutes per session: Not on file  . Stress: Not on file  Relationships  . Social connections:    Talks on phone: Not on file  Gets together: Not on file    Attends religious service: Not on file    Active member of club or organization: Not on file    Attends meetings of clubs or organizations: Not on file    Relationship status: Not on file  . Intimate partner violence:    Fear of current or ex partner: Not on file    Emotionally abused: Not on file    Physically abused: Not on file    Forced sexual activity: Not on  file  Other Topics Concern  . Not on file  Social History Narrative  . Not on file    FAMILY HISTORY: Family History  Problem Relation Age of Onset  . Cancer Father        lung    ALLERGIES:  has No Known Allergies.  MEDICATIONS:  Current Outpatient Medications  Medication Sig Dispense Refill  . aspirin 81 MG tablet Take 81 mg by mouth daily.    Marland Kitchen lisinopril (PRINIVIL,ZESTRIL) 5 MG tablet Take by mouth. Take 1 tab by mouth once daily    . metFORMIN (GLUCOPHAGE) 500 MG tablet Take 500 mg by mouth 2 (two) times daily.    . metoprolol tartrate (LOPRESSOR) 50 MG tablet Take 50 mg by mouth 2 (two) times daily.    . Rivaroxaban 15 & 20 MG TBPK Take as directed on package: Start with one 82m tablet by mouth twice a day with food. On Day 22, switch to one 281mtablet once a day with food. 51 each 0  . simvastatin (ZOCOR) 40 MG tablet Take 40 mg by mouth daily.     No current facility-administered medications for this visit.      PHYSICAL EXAMINATION: ECOG PERFORMANCE STATUS: 0 - Asymptomatic Vitals:   08/10/18 1339  BP: 112/73  Pulse: 70  Resp: 18  Temp: (!) 97.2 F (36.2 C)   Filed Weights   08/10/18 1339  Weight: 218 lb 8 oz (99.1 kg)    Physical Exam Constitutional:      General: He is not in acute distress. HENT:     Head: Normocephalic and atraumatic.  Eyes:     General: No scleral icterus.    Conjunctiva/sclera: Conjunctivae normal.     Pupils: Pupils are equal, round, and reactive to light.  Neck:     Musculoskeletal: Normal range of motion and neck supple.  Cardiovascular:     Rate and Rhythm: Normal rate and regular rhythm.     Heart sounds: Normal heart sounds.  Pulmonary:     Effort: Pulmonary effort is normal. No respiratory distress.     Breath sounds: Normal breath sounds. No wheezing or rales.  Chest:     Chest wall: No tenderness.  Abdominal:     General: Bowel sounds are normal. There is no distension.     Palpations: Abdomen is soft. There  is no mass.     Tenderness: There is no abdominal tenderness.  Musculoskeletal: Normal range of motion.        General: No swelling or deformity.  Lymphadenopathy:     Cervical: No cervical adenopathy.  Skin:    General: Skin is warm and dry.     Findings: No erythema or rash.  Neurological:     Mental Status: He is alert and oriented to person, place, and time.     Cranial Nerves: No cranial nerve deficit.     Coordination: Coordination normal.  Psychiatric:        Behavior: Behavior normal.  Thought Content: Thought content normal.      LABORATORY DATA:  I have reviewed the data as listed Lab Results  Component Value Date   WBC 10.3 08/10/2018   HGB 16.0 08/10/2018   HCT 47.4 08/10/2018   MCV 91.9 08/10/2018   PLT 249 08/10/2018   Recent Labs    02/06/18 1620 05/11/18 1319 08/10/18 1321  NA 139 140  --   K 4.3 4.0  --   CL 103 103  --   CO2 24 26  --   GLUCOSE 132* 113*  --   BUN 20 15  --   CREATININE 1.04 0.96 1.01  CALCIUM 9.1 9.0  --   GFRNONAA >60 >60 >60  GFRAA >60 >60 >60  PROT 7.3 7.0  --   ALBUMIN 4.1 4.3  --   AST 20 23  --   ALT 19 19  --   ALKPHOS 60 45  --   BILITOT 0.7 0.5  --    Iron/TIBC/Ferritin/ %Sat No results found for: IRON, TIBC, FERRITIN, IRONPCTSAT      ASSESSMENT & PLAN:  1. History of femoral vein thrombosis   2. Chronic anticoagulation    #History of femoral vein thrombosis unprovoked  Labs are reviewed.  Tolerating anticoagulation well.  Stable kidney function. Patient has finished 6 months of full strength anticoagulation with Xarelto 20 mg daily. Discussed with patient about switching to Xarelto 10 mg daily as maintenance.  Duration long-term. Of the fill his prescription.  Patient tells me that he is PCP at [facility] as already discussed with him about switching to low-dose Xarelto and he will get a new prescription from [place] system..   #history of CAD status post percutaneous coronary angioplasty.  Continue  aspirin 81 mg.  Previously discussed with patient's cardiologist.     Orders Placed This Encounter  Procedures  . CBC with Differential/Platelet    Standing Status:   Future    Standing Expiration Date:   08/10/2019  . Comprehensive metabolic panel    Standing Status:   Future    Standing Expiration Date:   08/10/2019    All questions were answered. The patient knows to call the clinic with any problems questions or concerns.  Return of visit: 4 months Total face to face encounter time for this patient visit was 15 min. >50% of the time was  spent in counseling and coordination of care.  Earlie Server, MD, PhD Hematology Oncology [facility] at [facility] Pager- [phone] 08/12/2018

## 2018-12-13 ENCOUNTER — Ambulatory Visit: Payer: Self-pay | Admitting: Oncology

## 2018-12-13 ENCOUNTER — Other Ambulatory Visit: Payer: Self-pay

## 2019-01-10 ENCOUNTER — Other Ambulatory Visit: Payer: Self-pay

## 2019-01-11 ENCOUNTER — Other Ambulatory Visit: Payer: Self-pay

## 2019-01-11 ENCOUNTER — Inpatient Hospital Stay: Payer: Medicare Other | Attending: Oncology | Admitting: *Deleted

## 2019-01-11 DIAGNOSIS — Z955 Presence of coronary angioplasty implant and graft: Secondary | ICD-10-CM | POA: Diagnosis not present

## 2019-01-11 DIAGNOSIS — Z801 Family history of malignant neoplasm of trachea, bronchus and lung: Secondary | ICD-10-CM | POA: Diagnosis not present

## 2019-01-11 DIAGNOSIS — Z7984 Long term (current) use of oral hypoglycemic drugs: Secondary | ICD-10-CM | POA: Insufficient documentation

## 2019-01-11 DIAGNOSIS — E119 Type 2 diabetes mellitus without complications: Secondary | ICD-10-CM | POA: Insufficient documentation

## 2019-01-11 DIAGNOSIS — E785 Hyperlipidemia, unspecified: Secondary | ICD-10-CM | POA: Diagnosis not present

## 2019-01-11 DIAGNOSIS — Z7982 Long term (current) use of aspirin: Secondary | ICD-10-CM | POA: Diagnosis not present

## 2019-01-11 DIAGNOSIS — Z79899 Other long term (current) drug therapy: Secondary | ICD-10-CM | POA: Diagnosis not present

## 2019-01-11 DIAGNOSIS — I2510 Atherosclerotic heart disease of native coronary artery without angina pectoris: Secondary | ICD-10-CM | POA: Diagnosis not present

## 2019-01-11 DIAGNOSIS — Z7901 Long term (current) use of anticoagulants: Secondary | ICD-10-CM | POA: Diagnosis not present

## 2019-01-11 DIAGNOSIS — Z86718 Personal history of other venous thrombosis and embolism: Secondary | ICD-10-CM | POA: Insufficient documentation

## 2019-01-11 DIAGNOSIS — I10 Essential (primary) hypertension: Secondary | ICD-10-CM | POA: Insufficient documentation

## 2019-01-11 LAB — CBC WITH DIFFERENTIAL/PLATELET
Abs Immature Granulocytes: 0.04 10*3/uL (ref 0.00–0.07)
Basophils Absolute: 0.1 10*3/uL (ref 0.0–0.1)
Basophils Relative: 1 %
Eosinophils Absolute: 0.2 10*3/uL (ref 0.0–0.5)
Eosinophils Relative: 2 %
HCT: 46.2 % (ref 39.0–52.0)
Hemoglobin: 16 g/dL (ref 13.0–17.0)
Immature Granulocytes: 0 %
Lymphocytes Relative: 35 %
Lymphs Abs: 3.4 10*3/uL (ref 0.7–4.0)
MCH: 31.8 pg (ref 26.0–34.0)
MCHC: 34.6 g/dL (ref 30.0–36.0)
MCV: 91.8 fL (ref 80.0–100.0)
Monocytes Absolute: 0.8 10*3/uL (ref 0.1–1.0)
Monocytes Relative: 8 %
Neutro Abs: 5.2 10*3/uL (ref 1.7–7.7)
Neutrophils Relative %: 54 %
Platelets: 239 10*3/uL (ref 150–400)
RBC: 5.03 MIL/uL (ref 4.22–5.81)
RDW: 12.1 % (ref 11.5–15.5)
WBC: 9.7 10*3/uL (ref 4.0–10.5)
nRBC: 0 % (ref 0.0–0.2)

## 2019-01-11 LAB — COMPREHENSIVE METABOLIC PANEL
ALT: 20 U/L (ref 0–44)
AST: 29 U/L (ref 15–41)
Albumin: 4.2 g/dL (ref 3.5–5.0)
Alkaline Phosphatase: 53 U/L (ref 38–126)
Anion gap: 11 (ref 5–15)
BUN: 16 mg/dL (ref 8–23)
CO2: 24 mmol/L (ref 22–32)
Calcium: 9 mg/dL (ref 8.9–10.3)
Chloride: 105 mmol/L (ref 98–111)
Creatinine, Ser: 1.03 mg/dL (ref 0.61–1.24)
GFR calc Af Amer: >60 mL/min (ref >60)
GFR calc non Af Amer: >60 mL/min (ref >60)
Glucose, Bld: 140 mg/dL — ABNORMAL HIGH (ref 70–99)
Potassium: 4 mmol/L (ref 3.5–5.1)
Sodium: 140 mmol/L (ref 135–145)
Total Bilirubin: 0.8 mg/dL (ref 0.3–1.2)
Total Protein: 7.1 g/dL (ref 6.5–8.1)

## 2019-01-12 ENCOUNTER — Other Ambulatory Visit: Payer: Self-pay

## 2019-01-12 ENCOUNTER — Encounter: Payer: Self-pay | Admitting: Oncology

## 2019-01-12 ENCOUNTER — Inpatient Hospital Stay (HOSPITAL_BASED_OUTPATIENT_CLINIC_OR_DEPARTMENT_OTHER): Payer: Medicare Other | Admitting: Oncology

## 2019-01-12 DIAGNOSIS — E119 Type 2 diabetes mellitus without complications: Secondary | ICD-10-CM

## 2019-01-12 DIAGNOSIS — Z86718 Personal history of other venous thrombosis and embolism: Secondary | ICD-10-CM

## 2019-01-12 DIAGNOSIS — Z7901 Long term (current) use of anticoagulants: Secondary | ICD-10-CM | POA: Diagnosis not present

## 2019-01-12 DIAGNOSIS — Z7982 Long term (current) use of aspirin: Secondary | ICD-10-CM

## 2019-01-12 DIAGNOSIS — Z79899 Other long term (current) drug therapy: Secondary | ICD-10-CM

## 2019-01-12 DIAGNOSIS — I10 Essential (primary) hypertension: Secondary | ICD-10-CM | POA: Diagnosis not present

## 2019-01-12 NOTE — Progress Notes (Signed)
HEMATOLOGY-ONCOLOGY TeleHEALTH VISIT PROGRESS NOTE  I connected with Kenneth Gilmore on 01/12/19 at 10:15 AM EDT by video enabled telemedicine visit and verified that I am speaking with the correct person using two identifiers. I discussed the limitations, risks, security and privacy concerns of performing an evaluation and management service by telemedicine and the availability of in-person appointments. I also discussed with the patient that there may be a patient responsible charge related to this service. The patient expressed understanding and agreed to proceed.   Other persons participating in the visit and their role in the encounter:  Geraldine Solar, CMA, check in patient     Patient's location: Home  Provider's location: work Chief Complaint: Follow-up for management of DVT and anticoagulation.   INTERVAL HISTORY Kenneth Gilmore is a 68 y.o. male who has above history reviewed by me today presents for follow up visit for management of DVT and anticoagulation. Problems and complaints are listed below:  Patient has history of femoral vein thrombosis, which is unprovoked.  This that she has made to continue chronic anticoagulation. He has finished 6 months of full strength anticoagulation with Xarelto 20 mg daily and during last visit, dose was reduced to Xarelto 10 mg daily as maintenance. Today patient reports feeling well.  Lower extremity swelling has completely resolved.  Occasionally he has some cramps. He also has a history of CAD status post percutaneous coronary angioplasty.  Per my discussion with his cardiologist, aspirin 81 mg is continued.   Review of Systems  Constitutional: Positive for fatigue. Negative for appetite change, chills, fever and unexpected weight change.  HENT:   Negative for hearing loss and voice change.   Eyes: Negative for eye problems and icterus.  Respiratory: Negative for chest tightness, cough and shortness of breath.   Cardiovascular: Negative  for chest pain and leg swelling.  Gastrointestinal: Negative for abdominal distention and abdominal pain.  Endocrine: Negative for hot flashes.  Genitourinary: Negative for difficulty urinating, dysuria and frequency.   Musculoskeletal: Negative for arthralgias.  Skin: Negative for itching and rash.  Neurological: Negative for light-headedness and numbness.  Hematological: Negative for adenopathy. Does not bruise/bleed easily.  Psychiatric/Behavioral: Negative for confusion.    Past Medical History:  Diagnosis Date  . Diabetes mellitus without complication ([place])   . Hyperlipidemia   . Hypertension    Past Surgical History:  Procedure Laterality Date  . arm surgery Left   . CORONARY ANGIOPLASTY WITH STENT PLACEMENT    . left side rotator cuff surgery    . SHOULDER SURGERY      Family History  Problem Relation Age of Onset  . Cancer Father        lung    Social History   Socioeconomic History  . Marital status: Single    Spouse name: Not on file  . Number of children: Not on file  . Years of education: Not on file  . Highest education level: Not on file  Occupational History  . Occupation: retired  Scientific laboratory technician  . Financial resource strain: Not on file  . Food insecurity:    Worry: Not on file    Inability: Not on file  . Transportation needs:    Medical: Not on file    Non-medical: Not on file  Tobacco Use  . Smoking status: Never Smoker  . Smokeless tobacco: Current User    Types: Chew  Substance and Sexual Activity  . Alcohol use: No  . Drug use: Never  . Sexual  activity: Not on file  Lifestyle  . Physical activity:    Days per week: Not on file    Minutes per session: Not on file  . Stress: Not on file  Relationships  . Social connections:    Talks on phone: Not on file    Gets together: Not on file    Attends religious service: Not on file    Active member of club or organization: Not on file    Attends meetings of clubs or organizations: Not on  file    Relationship status: Not on file  . Intimate partner violence:    Fear of current or ex partner: Not on file    Emotionally abused: Not on file    Physically abused: Not on file    Forced sexual activity: Not on file  Other Topics Concern  . Not on file  Social History Narrative  . Not on file    Current Outpatient Medications on File Prior to Visit  Medication Sig Dispense Refill  . aspirin 81 MG tablet Take 81 mg by mouth daily.    Marland Kitchen lisinopril (PRINIVIL,ZESTRIL) 5 MG tablet Take by mouth. Take 1 tab by mouth once daily    . metFORMIN (GLUCOPHAGE) 500 MG tablet Take 500 mg by mouth 2 (two) times daily.    . metoprolol tartrate (LOPRESSOR) 50 MG tablet Take 50 mg by mouth 2 (two) times daily.    . Rivaroxaban 15 & 20 MG TBPK Take as directed on package: Start with one 15mg  tablet by mouth twice a day with food. On Day 22, switch to one 20mg  tablet once a day with food. 51 each 0  . simvastatin (ZOCOR) 40 MG tablet Take 40 mg by mouth daily.     No current facility-administered medications on file prior to visit.     No Known Allergies     Observations/Objective: There were no vitals filed for this visit. There is no height or weight on file to calculate BMI.  Physical Exam  Constitutional: He is oriented to person, place, and time. No distress.  HENT:  Head: Normocephalic and atraumatic.  Pulmonary/Chest: Effort normal.  Neurological: He is alert and oriented to person, place, and time.  Psychiatric: Affect normal.    CBC    Component Value Date/Time   WBC 9.7 01/11/2019 1056   RBC 5.03 01/11/2019 1056   HGB 16.0 01/11/2019 1056   HCT 46.2 01/11/2019 1056   PLT 239 01/11/2019 1056   MCV 91.8 01/11/2019 1056   MCH 31.8 01/11/2019 1056   MCHC 34.6 01/11/2019 1056   RDW 12.1 01/11/2019 1056   LYMPHSABS 3.4 01/11/2019 1056   MONOABS 0.8 01/11/2019 1056   EOSABS 0.2 01/11/2019 1056   BASOSABS 0.1 01/11/2019 1056    CMP     Component Value Date/Time   NA  140 01/11/2019 1056   K 4.0 01/11/2019 1056   CL 105 01/11/2019 1056   CO2 24 01/11/2019 1056   GLUCOSE 140 (H) 01/11/2019 1056   BUN 16 01/11/2019 1056   CREATININE 1.03 01/11/2019 1056   CALCIUM 9.0 01/11/2019 1056   PROT 7.1 01/11/2019 1056   ALBUMIN 4.2 01/11/2019 1056   AST 29 01/11/2019 1056   ALT 20 01/11/2019 1056   ALKPHOS 53 01/11/2019 1056   BILITOT 0.8 01/11/2019 1056   GFRNONAA >60 01/11/2019 1056   GFRAA >60 01/11/2019 1056     Assessment and Plan: 1. History of femoral vein thrombosis   2. Chronic anticoagulation  Labs are reviewed and discussed with patient.  Stable kidney function. Tolerating Xarelto 10 mg daily.  No bleeding problems. Recommend continue chronic anticoagulation maintenance with Xarelto 10 mg daily.  Patient need to follow-up with cardiologist for history of CAD status post percutaneous coronary angioplasty to determine if he needs to continue 81 mg aspirin.  Follow Up Instructions: Lab MD assessment in 6 months. Orders Placed This Encounter  Procedures  . CBC with Differential/Platelet    Standing Status:   Future    Standing Expiration Date:   01/12/2020  . Comprehensive metabolic panel    Standing Status:   Future    Standing Expiration Date:   01/12/2020     I discussed the assessment and treatment plan with the patient. The patient was provided an opportunity to ask questions and all were answered. The patient agreed with the plan and demonstrated an understanding of the instructions.  The patient was advised to call back or seek an in-person evaluation if the symptoms worsen or if the condition fails to improve as anticipated.    Earlie Server, MD 01/12/2019 6:37 PM

## 2019-01-12 NOTE — Progress Notes (Signed)
Called patient today for Telehealth visit via [place].  Patient states no new concerns today.  Patient states "i'm feeling pretty good"

## 2019-04-17 ENCOUNTER — Other Ambulatory Visit: Payer: Medicare Other

## 2019-04-17 ENCOUNTER — Ambulatory Visit: Payer: Medicare Other | Admitting: Oncology

## 2019-07-16 ENCOUNTER — Ambulatory Visit: Payer: Medicare Other | Admitting: Oncology

## 2019-07-16 ENCOUNTER — Other Ambulatory Visit: Payer: Medicare Other

## 2019-07-24 ENCOUNTER — Encounter: Payer: Self-pay | Admitting: Oncology

## 2019-07-24 ENCOUNTER — Other Ambulatory Visit: Payer: Self-pay

## 2019-07-24 ENCOUNTER — Inpatient Hospital Stay: Payer: Medicare Other | Attending: Oncology

## 2019-07-24 DIAGNOSIS — I10 Essential (primary) hypertension: Secondary | ICD-10-CM | POA: Insufficient documentation

## 2019-07-24 DIAGNOSIS — I2510 Atherosclerotic heart disease of native coronary artery without angina pectoris: Secondary | ICD-10-CM | POA: Diagnosis not present

## 2019-07-24 DIAGNOSIS — Z955 Presence of coronary angioplasty implant and graft: Secondary | ICD-10-CM | POA: Diagnosis not present

## 2019-07-24 DIAGNOSIS — Z7984 Long term (current) use of oral hypoglycemic drugs: Secondary | ICD-10-CM | POA: Diagnosis not present

## 2019-07-24 DIAGNOSIS — E785 Hyperlipidemia, unspecified: Secondary | ICD-10-CM | POA: Diagnosis not present

## 2019-07-24 DIAGNOSIS — Z86718 Personal history of other venous thrombosis and embolism: Secondary | ICD-10-CM | POA: Diagnosis present

## 2019-07-24 DIAGNOSIS — E119 Type 2 diabetes mellitus without complications: Secondary | ICD-10-CM | POA: Insufficient documentation

## 2019-07-24 DIAGNOSIS — Z7901 Long term (current) use of anticoagulants: Secondary | ICD-10-CM | POA: Diagnosis not present

## 2019-07-24 DIAGNOSIS — Z7982 Long term (current) use of aspirin: Secondary | ICD-10-CM | POA: Insufficient documentation

## 2019-07-24 DIAGNOSIS — Z79899 Other long term (current) drug therapy: Secondary | ICD-10-CM | POA: Insufficient documentation

## 2019-07-24 LAB — CBC WITH DIFFERENTIAL/PLATELET
Abs Immature Granulocytes: 0.03 10*3/uL (ref 0.00–0.07)
Basophils Absolute: 0.1 10*3/uL (ref 0.0–0.1)
Basophils Relative: 1 %
Eosinophils Absolute: 0.3 10*3/uL (ref 0.0–0.5)
Eosinophils Relative: 3 %
HCT: 47.8 % (ref 39.0–52.0)
Hemoglobin: 15.7 g/dL (ref 13.0–17.0)
Immature Granulocytes: 0 %
Lymphocytes Relative: 41 %
Lymphs Abs: 3.6 10*3/uL (ref 0.7–4.0)
MCH: 31.7 pg (ref 26.0–34.0)
MCHC: 32.8 g/dL (ref 30.0–36.0)
MCV: 96.6 fL (ref 80.0–100.0)
Monocytes Absolute: 0.7 10*3/uL (ref 0.1–1.0)
Monocytes Relative: 8 %
Neutro Abs: 4.1 10*3/uL (ref 1.7–7.7)
Neutrophils Relative %: 47 %
Platelets: 231 10*3/uL (ref 150–400)
RBC: 4.95 MIL/uL (ref 4.22–5.81)
RDW: 12.7 % (ref 11.5–15.5)
WBC: 8.8 10*3/uL (ref 4.0–10.5)
nRBC: 0 % (ref 0.0–0.2)

## 2019-07-24 LAB — COMPREHENSIVE METABOLIC PANEL
ALT: 18 U/L (ref 0–44)
AST: 18 U/L (ref 15–41)
Albumin: 4.3 g/dL (ref 3.5–5.0)
Alkaline Phosphatase: 43 U/L (ref 38–126)
Anion gap: 9 (ref 5–15)
BUN: 16 mg/dL (ref 8–23)
CO2: 28 mmol/L (ref 22–32)
Calcium: 9.2 mg/dL (ref 8.9–10.3)
Chloride: 102 mmol/L (ref 98–111)
Creatinine, Ser: 0.85 mg/dL (ref 0.61–1.24)
GFR calc Af Amer: >60 mL/min (ref >60)
GFR calc non Af Amer: >60 mL/min (ref >60)
Glucose, Bld: 143 mg/dL — ABNORMAL HIGH (ref 70–99)
Potassium: 4 mmol/L (ref 3.5–5.1)
Sodium: 139 mmol/L (ref 135–145)
Total Bilirubin: 0.5 mg/dL (ref 0.3–1.2)
Total Protein: 7.3 g/dL (ref 6.5–8.1)

## 2019-07-24 NOTE — Progress Notes (Signed)
Patient pre-screened for telehealth visit. He states he is currently on 10 of xarelto. No concerns voiced.

## 2019-07-25 ENCOUNTER — Inpatient Hospital Stay (HOSPITAL_BASED_OUTPATIENT_CLINIC_OR_DEPARTMENT_OTHER): Payer: Medicare Other | Admitting: Oncology

## 2019-07-25 ENCOUNTER — Inpatient Hospital Stay: Payer: Medicare Other

## 2019-07-25 DIAGNOSIS — Z7901 Long term (current) use of anticoagulants: Secondary | ICD-10-CM | POA: Diagnosis not present

## 2019-07-25 DIAGNOSIS — Z86718 Personal history of other venous thrombosis and embolism: Secondary | ICD-10-CM

## 2019-07-26 NOTE — Progress Notes (Signed)
HEMATOLOGY-ONCOLOGY TeleHEALTH VISIT PROGRESS NOTE  I connected with Kenneth Gilmore on 07/26/19 at  1:00 PM EST by video enabled telemedicine visit and verified that I am speaking with the correct person using two identifiers. I discussed the limitations, risks, security and privacy concerns of performing an evaluation and management service by telemedicine and the availability of in-person appointments. I also discussed with the patient that there may be a patient responsible charge related to this service. The patient expressed understanding and agreed to proceed.   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   INTERVAL HISTORY Kenneth Gilmore is a 68 y.o. male who has above history reviewed by me today presents for follow up visit for management of DVT and anticoagulation. Problems and complaints are listed below:  Patient reports feeling well today. No new complaints. Patient is taking Xarelto 10 mg daily as maintenance. No new swelling or calf tenderness denies any chest pain, shortness of breath. Patient also takes aspirin 81 mg for CAD status post percutaneous coronary angioplasty Patient denies any bleeding events.   Review of Systems  Constitutional: Negative for appetite change, chills, fatigue, fever and unexpected weight change.  HENT:   Negative for hearing loss and voice change.   Eyes: Negative for eye problems and icterus.  Respiratory: Negative for chest tightness, cough and shortness of breath.   Cardiovascular: Negative for chest pain and leg swelling.  Gastrointestinal: Negative for abdominal distention and abdominal pain.  Endocrine: Negative for hot flashes.  Genitourinary: Negative for difficulty urinating, dysuria and frequency.   Musculoskeletal: Negative for arthralgias.  Skin: Negative for itching  and rash.  Neurological: Negative for light-headedness and numbness.  Hematological: Negative for adenopathy. Does not bruise/bleed easily.  Psychiatric/Behavioral: Negative for confusion.    Past Medical History:  Diagnosis Date  . Diabetes mellitus without complication ([place])   . Hyperlipidemia   . Hypertension    Past Surgical History:  Procedure Laterality Date  . arm surgery Left   . CORONARY ANGIOPLASTY WITH STENT PLACEMENT    . left side rotator cuff surgery    . SHOULDER SURGERY      Family History  Problem Relation Age of Onset  . Cancer Father        lung    Social History   Socioeconomic History  . Marital status: Single    Spouse name: Not on file  . Number of children: Not on file  . Years of education: Not on file  . Highest education level: Not on file  Occupational History  . Occupation: retired  Scientific laboratory technician  . Financial resource strain: Not on file  . Food insecurity    Worry: Not on file    Inability: Not on file  . Transportation needs    Medical: Not on file    Non-medical: Not on file  Tobacco Use  . Smoking status: Never Smoker  . Smokeless tobacco: Current User    Types: Chew  Substance and Sexual Activity  . Alcohol use: No  . Drug use: Never  . Sexual activity: Not on file  Lifestyle  . Physical activity    Days per week: Not on file    Minutes per session: Not on file  . Stress: Not on file  Relationships  . Social connections    Talks on phone: Not on file  Gets together: Not on file    Attends religious service: Not on file    Active member of club or organization: Not on file    Attends meetings of clubs or organizations: Not on file    Relationship status: Not on file  . Intimate partner violence    Fear of current or ex partner: Not on file    Emotionally abused: Not on file    Physically abused: Not on file    Forced sexual activity: Not on file  Other Topics Concern  . Not on file  Social History Narrative  . Not  on file    Current Outpatient Medications on File Prior to Visit  Medication Sig Dispense Refill  . aspirin 81 MG tablet Take 81 mg by mouth daily.    . Aspirin-Calcium Carbonate 81-777 MG TABS Take by mouth.    . empagliflozin (JARDIANCE) 10 MG TABS tablet Take by mouth.    Marland Kitchen lisinopril (PRINIVIL,ZESTRIL) 5 MG tablet Take by mouth. Take 1 tab by mouth once daily    . metFORMIN (GLUCOPHAGE) 500 MG tablet Take 500 mg by mouth 2 (two) times daily.    . metoprolol tartrate (LOPRESSOR) 50 MG tablet Take 50 mg by mouth 2 (two) times daily.    . Rivaroxaban 15 & 20 MG TBPK Take as directed on package: Start with one 15mg  tablet by mouth twice a day with food. On Day 22, switch to one 20mg  tablet once a day with food. (Patient taking differently: Take 10 mg by mouth daily. Take as directed on package: Start with one 15mg  tablet by mouth twice a day with food. On Day 22, switch to one 20mg  tablet once a day with food.) 51 each 0  . simvastatin (ZOCOR) 40 MG tablet Take 40 mg by mouth daily.     No current facility-administered medications on file prior to visit.     No Known Allergies     Observations/Objective: There were no vitals filed for this visit. There is no height or weight on file to calculate BMI.  Physical Exam  Constitutional: He is oriented to person, place, and time. No distress.  HENT:  Head: Normocephalic and atraumatic.  Pulmonary/Chest: Effort normal.  Neurological: He is alert and oriented to person, place, and time.  Psychiatric: Mood normal.    CBC    Component Value Date/Time   WBC 8.8 07/24/2019 1342   RBC 4.95 07/24/2019 1342   HGB 15.7 07/24/2019 1342   HCT 47.8 07/24/2019 1342   PLT 231 07/24/2019 1342   MCV 96.6 07/24/2019 1342   MCH 31.7 07/24/2019 1342   MCHC 32.8 07/24/2019 1342   RDW 12.7 07/24/2019 1342   LYMPHSABS 3.6 07/24/2019 1342   MONOABS 0.7 07/24/2019 1342   EOSABS 0.3 07/24/2019 1342   BASOSABS 0.1 07/24/2019 1342    CMP     Component  Value Date/Time   NA 139 07/24/2019 1342   K 4.0 07/24/2019 1342   CL 102 07/24/2019 1342   CO2 28 07/24/2019 1342   GLUCOSE 143 (H) 07/24/2019 1342   BUN 16 07/24/2019 1342   CREATININE 0.85 07/24/2019 1342   CALCIUM 9.2 07/24/2019 1342   PROT 7.3 07/24/2019 1342   ALBUMIN 4.3 07/24/2019 1342   AST 18 07/24/2019 1342   ALT 18 07/24/2019 1342   ALKPHOS 43 07/24/2019 1342   BILITOT 0.5 07/24/2019 1342   GFRNONAA >60 07/24/2019 1342   GFRAA >60 07/24/2019 1342     Assessment and  Plan: 1. History of femoral vein thrombosis   2. Chronic anticoagulation    Labs are reviewed and discussed with patient. Stable kidney function.  Tolerating Xarelto 10 mg daily.  Continue. Recommend to continue chronic anticoagulation maintenance with Xarelto 10 mg daily.  Patient is also on aspirin 81 mg daily for CAD.  Tolerating well.  Continue follow-up with cardiology.   Follow Up Instructions: Lab MD assessment in 6 months. Orders Placed This Encounter  Procedures  . CBC with Differential/Platelet    Standing Status:   Future    Standing Expiration Date:   07/24/2020  . Comprehensive metabolic panel    Standing Status:   Future    Standing Expiration Date:   07/24/2020     I discussed the assessment and treatment plan with the patient. The patient was provided an opportunity to ask questions and all were answered. The patient agreed with the plan and demonstrated an understanding of the instructions.  The patient was advised to call back or seek an in-person evaluation if the symptoms worsen or if the condition fails to improve as anticipated.    Earlie Server, MD 07/26/2019 11:27 PM

## 2020-01-22 ENCOUNTER — Ambulatory Visit: Payer: Medicare Other | Admitting: Oncology

## 2020-01-22 ENCOUNTER — Other Ambulatory Visit: Payer: Medicare Other

## 2020-01-24 ENCOUNTER — Other Ambulatory Visit: Payer: Medicare Other

## 2020-01-24 ENCOUNTER — Ambulatory Visit: Payer: Medicare Other | Admitting: Oncology

## 2020-01-28 ENCOUNTER — Inpatient Hospital Stay: Payer: Medicare Other | Admitting: Oncology

## 2020-01-28 ENCOUNTER — Inpatient Hospital Stay: Payer: Medicare Other

## 2020-02-01 ENCOUNTER — Inpatient Hospital Stay: Payer: Medicare Other

## 2020-02-01 ENCOUNTER — Inpatient Hospital Stay: Payer: Medicare Other | Attending: Oncology | Admitting: Oncology

## 2020-02-01 ENCOUNTER — Encounter: Payer: Self-pay | Admitting: Oncology

## 2020-02-01 ENCOUNTER — Other Ambulatory Visit: Payer: Self-pay

## 2020-02-01 VITALS — BP 111/65 | HR 62 | Temp 96.7°F | Wt 216.2 lb

## 2020-02-01 DIAGNOSIS — I10 Essential (primary) hypertension: Secondary | ICD-10-CM | POA: Diagnosis not present

## 2020-02-01 DIAGNOSIS — Z7984 Long term (current) use of oral hypoglycemic drugs: Secondary | ICD-10-CM | POA: Diagnosis not present

## 2020-02-01 DIAGNOSIS — Z79899 Other long term (current) drug therapy: Secondary | ICD-10-CM | POA: Insufficient documentation

## 2020-02-01 DIAGNOSIS — E785 Hyperlipidemia, unspecified: Secondary | ICD-10-CM | POA: Diagnosis not present

## 2020-02-01 DIAGNOSIS — E119 Type 2 diabetes mellitus without complications: Secondary | ICD-10-CM | POA: Diagnosis not present

## 2020-02-01 DIAGNOSIS — Z7982 Long term (current) use of aspirin: Secondary | ICD-10-CM | POA: Diagnosis not present

## 2020-02-01 DIAGNOSIS — Z86718 Personal history of other venous thrombosis and embolism: Secondary | ICD-10-CM

## 2020-02-01 DIAGNOSIS — Z801 Family history of malignant neoplasm of trachea, bronchus and lung: Secondary | ICD-10-CM | POA: Insufficient documentation

## 2020-02-01 DIAGNOSIS — D72829 Elevated white blood cell count, unspecified: Secondary | ICD-10-CM | POA: Insufficient documentation

## 2020-02-01 DIAGNOSIS — Z7901 Long term (current) use of anticoagulants: Secondary | ICD-10-CM | POA: Diagnosis not present

## 2020-02-01 LAB — COMPREHENSIVE METABOLIC PANEL
ALT: 19 U/L (ref 0–44)
AST: 21 U/L (ref 15–41)
Albumin: 4.2 g/dL (ref 3.5–5.0)
Alkaline Phosphatase: 49 U/L (ref 38–126)
Anion gap: 11 (ref 5–15)
BUN: 19 mg/dL (ref 8–23)
CO2: 27 mmol/L (ref 22–32)
Calcium: 9 mg/dL (ref 8.9–10.3)
Chloride: 102 mmol/L (ref 98–111)
Creatinine, Ser: 1.01 mg/dL (ref 0.61–1.24)
GFR calc Af Amer: >60 mL/min (ref >60)
GFR calc non Af Amer: >60 mL/min (ref >60)
Glucose, Bld: 153 mg/dL — ABNORMAL HIGH (ref 70–99)
Potassium: 4.2 mmol/L (ref 3.5–5.1)
Sodium: 140 mmol/L (ref 135–145)
Total Bilirubin: 0.7 mg/dL (ref 0.3–1.2)
Total Protein: 7.2 g/dL (ref 6.5–8.1)

## 2020-02-01 LAB — CBC WITH DIFFERENTIAL/PLATELET
Abs Immature Granulocytes: 0.04 10*3/uL (ref 0.00–0.07)
Basophils Absolute: 0.1 10*3/uL (ref 0.0–0.1)
Basophils Relative: 1 %
Eosinophils Absolute: 0.4 10*3/uL (ref 0.0–0.5)
Eosinophils Relative: 3 %
HCT: 46.8 % (ref 39.0–52.0)
Hemoglobin: 16.1 g/dL (ref 13.0–17.0)
Immature Granulocytes: 0 %
Lymphocytes Relative: 41 %
Lymphs Abs: 4.5 10*3/uL — ABNORMAL HIGH (ref 0.7–4.0)
MCH: 32.5 pg (ref 26.0–34.0)
MCHC: 34.4 g/dL (ref 30.0–36.0)
MCV: 94.4 fL (ref 80.0–100.0)
Monocytes Absolute: 0.9 10*3/uL (ref 0.1–1.0)
Monocytes Relative: 8 %
Neutro Abs: 5.2 10*3/uL (ref 1.7–7.7)
Neutrophils Relative %: 47 %
Platelets: 232 10*3/uL (ref 150–400)
RBC: 4.96 MIL/uL (ref 4.22–5.81)
RDW: 12.4 % (ref 11.5–15.5)
WBC: 11 10*3/uL — ABNORMAL HIGH (ref 4.0–10.5)
nRBC: 0 % (ref 0.0–0.2)

## 2020-02-01 NOTE — Progress Notes (Signed)
Patient denies new problems/concerns today.   °

## 2020-02-02 NOTE — Progress Notes (Signed)
Hematology/Oncology follow up  note [facility] Telephone:([phone] Fax:(336) [phone]   Patient Care Team: Georgiana Shore., PA-C as PCP - General  REASON FOR VISIT Follow up for treatment of  DVT and chronic anticoagulation  HISTORY OF PRESENTING ILLNESS:  Kenneth Gilmore is a  69 y.o.  male with PMH listed below who was referred to me for evaluation of DVT. Patient presented outpatient ultrasound of his left lower extremity showing positive for DVT. Denies any recent,, surgery, travel or issue.  Denies any family history of DVT.  Reports that he developed left lower extremity swelling for a week, associated with moderate severity throbbing aching discomfort feels like pulling a muscle.  Denies any shortness of breath. Patient was started on Xarelto starting kit and advised to follow-up with heme-onc.  Today patient reports being compliant with taking Xarelto.  Currently taking 15 mg twice daily. Tolerates well.Denies hematochezia, hematuria, hematemesis, epistaxis, black tarry stool or easy bruising.   INTERVAL HISTORY Kenneth Gilmore is a 69 y.o. 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   He is on Xarelto 66m daily for anticoagulation prophylaxis.  No lower extremity swelling.     Review of Systems  Constitutional: Negative for chills, fever, malaise/fatigue and weight loss.  HENT: Negative for sore throat.   Eyes: Negative for redness.  Respiratory: Negative for cough, shortness of breath and wheezing.   Cardiovascular: Negative for chest pain, palpitations and leg swelling.  Gastrointestinal: Negative for abdominal pain, blood in stool, nausea and vomiting.  Genitourinary: Negative for dysuria.  Musculoskeletal: Negative for myalgias.  Skin: Negative for rash.  Neurological: Negative for dizziness, tingling and  tremors.  Endo/Heme/Allergies: Does not bruise/bleed easily.  Psychiatric/Behavioral: Negative for hallucinations.    MEDICAL HISTORY:  Past Medical History:  Diagnosis Date  . Diabetes mellitus without complication ([place]   . Hyperlipidemia   . Hypertension     SURGICAL HISTORY: Past Surgical History:  Procedure Laterality Date  . arm surgery Left   . CORONARY ANGIOPLASTY WITH STENT PLACEMENT    . left side rotator cuff surgery    . SHOULDER SURGERY      SOCIAL HISTORY: Social History   Socioeconomic History  . Marital status: Single    Spouse name: Not on file  . Number of children: Not on file  . Years of education: Not on file  . Highest education level: Not on file  Occupational History  . Occupation: retired  Tobacco Use  . Smoking status: Never Smoker  . Smokeless tobacco: Current User    Types: Chew  Substance and Sexual Activity  . Alcohol use: No  . Drug use: Never  . Sexual activity: Not on file  Other Topics Concern  . Not on file  Social History Narrative  . Not on file   Social Determinants of Health   Financial Resource Strain:   . Difficulty of Paying Living Expenses:   Food Insecurity:   . Worried About RCharity fundraiserin the Last Year:   . RArboriculturistin the Last Year:   Transportation Needs:   . LFilm/video editor(Medical):   .Marland KitchenLack of Transportation (Non-Medical):   Physical Activity:   . Days of Exercise per Week:   . Minutes of Exercise per Session:   Stress:   . Feeling of Stress :  Social Connections:   . Frequency of Communication with Friends and Family:   . Frequency of Social Gatherings with Friends and Family:   . Attends Religious Services:   . Active Member of Clubs or Organizations:   . Attends Archivist Meetings:   Marland Kitchen Marital Status:   Intimate Partner Violence:   . Fear of Current or Ex-Partner:   . Emotionally Abused:   Marland Kitchen Physically Abused:   . Sexually Abused:     FAMILY  HISTORY: Family History  Problem Relation Age of Onset  . Cancer Father        lung    ALLERGIES:  has No Known Allergies.  MEDICATIONS:  Current Outpatient Medications  Medication Sig Dispense Refill  . aspirin 81 MG tablet Take 81 mg by mouth daily.    . Aspirin-Calcium Carbonate 81-777 MG TABS Take by mouth.    . empagliflozin (JARDIANCE) 10 MG TABS tablet Take by mouth.    Marland Kitchen lisinopril (PRINIVIL,ZESTRIL) 5 MG tablet Take by mouth. Take 1 tab by mouth once daily    . metFORMIN (GLUCOPHAGE) 500 MG tablet Take 500 mg by mouth 2 (two) times daily.    . metoprolol tartrate (LOPRESSOR) 50 MG tablet Take 50 mg by mouth 2 (two) times daily.    . Rivaroxaban 15 & 20 MG TBPK Take as directed on package: Start with one 60m tablet by mouth twice a day with food. On Day 22, switch to one 245mtablet once a day with food. (Patient taking differently: Take 10 mg by mouth daily. [facility]) 51 each 0  . simvastatin (ZOCOR) 40 MG tablet Take 40 mg by mouth daily.     No current facility-administered medications for this visit.     PHYSICAL EXAMINATION: ECOG PERFORMANCE STATUS: 0 - Asymptomatic Vitals:   02/01/20 1441  BP: 111/65  Pulse: 62  Temp: (!) 96.7 F (35.9 C)  SpO2: 96%   Filed Weights   02/01/20 1441  Weight: 216 lb 3.2 oz (98.1 kg)    Physical Exam Constitutional:      General: He is not in acute distress. HENT:     Head: Normocephalic and atraumatic.  Eyes:     General: No scleral icterus.    Conjunctiva/sclera: Conjunctivae normal.     Pupils: Pupils are equal, round, and reactive to light.  Cardiovascular:     Rate and Rhythm: Normal rate and regular rhythm.     Heart sounds: Normal heart sounds.  Pulmonary:     Effort: Pulmonary effort is normal. No respiratory distress.     Breath sounds: Normal breath sounds. No wheezing or rales.  Chest:     Chest wall: No tenderness.  Abdominal:     General: Bowel sounds are normal. There is  no distension.     Palpations: Abdomen is soft. There is no mass.     Tenderness: There is no abdominal tenderness.  Musculoskeletal:        General: No swelling or deformity. Normal range of motion.     Cervical back: Normal range of motion and neck supple.  Lymphadenopathy:     Cervical: No cervical adenopathy.  Skin:    General: Skin is warm and dry.     Findings: No erythema or rash.  Neurological:     Mental Status: He is alert and oriented to person, place, and time. Mental status is at baseline.     Cranial Nerves: No cranial nerve deficit.  Coordination: Coordination normal.  Psychiatric:        Mood and Affect: Mood normal.      LABORATORY DATA:  I have reviewed the data as listed Lab Results  Component Value Date   WBC 11.0 (H) 02/01/2020   HGB 16.1 02/01/2020   HCT 46.8 02/01/2020   MCV 94.4 02/01/2020   PLT 232 02/01/2020   Recent Labs    07/24/19 1342 02/01/20 1341  NA 139 140  K 4.0 4.2  CL 102 102  CO2 28 27  GLUCOSE 143* 153*  BUN 16 19  CREATININE 0.85 1.01  CALCIUM 9.2 9.0  GFRNONAA >60 >60  GFRAA >60 >60  PROT 7.3 7.2  ALBUMIN 4.3 4.2  AST 18 21  ALT 18 19  ALKPHOS 43 49  BILITOT 0.5 0.7   Iron/TIBC/Ferritin/ %Sat No results found for: IRON, TIBC, FERRITIN, IRONPCTSAT      ASSESSMENT & PLAN:  1. Chronic anticoagulation   2. Leukocytosis, unspecified type    #History of femoral vein thrombosis unprovoked  He tolerates low dose Xarelto 64m daily.  Continue current regimen.   #history of CAD status post percutaneous coronary angioplasty.  On Aspirin 81 mg.  Previously discussed with  # Leukocytosis, acute onset, unknown etiology. I ask patient to have repeat cbc done when he sees his pcp for regular follow up   Orders Placed This Encounter  Procedures  . CBC with Differential/Platelet    Standing Status:   Future    Standing Expiration Date:   01/31/2021  . Comprehensive metabolic panel    Standing Status:   Future     Standing Expiration Date:   01/31/2021    All questions were answered. The patient knows to call the clinic with any problems questions or concerns.  Return of visit: 4 months  ZEarlie Server MD, PhD Hematology Oncology CThe [facility] Centerat [facility] HospitalPager- [phone]/07/2020

## 2020-02-27 DIAGNOSIS — I10 Essential (primary) hypertension: Secondary | ICD-10-CM | POA: Diagnosis not present

## 2020-02-27 DIAGNOSIS — Z1389 Encounter for screening for other disorder: Secondary | ICD-10-CM | POA: Diagnosis not present

## 2020-02-27 DIAGNOSIS — E785 Hyperlipidemia, unspecified: Secondary | ICD-10-CM | POA: Diagnosis not present

## 2020-02-27 DIAGNOSIS — E1169 Type 2 diabetes mellitus with other specified complication: Secondary | ICD-10-CM | POA: Diagnosis not present

## 2020-02-29 DIAGNOSIS — Z1159 Encounter for screening for other viral diseases: Secondary | ICD-10-CM | POA: Diagnosis not present

## 2020-02-29 DIAGNOSIS — I10 Essential (primary) hypertension: Secondary | ICD-10-CM | POA: Diagnosis not present

## 2020-02-29 DIAGNOSIS — E1169 Type 2 diabetes mellitus with other specified complication: Secondary | ICD-10-CM | POA: Diagnosis not present

## 2020-03-04 DIAGNOSIS — G250 Essential tremor: Secondary | ICD-10-CM | POA: Diagnosis not present

## 2020-03-04 DIAGNOSIS — E538 Deficiency of other specified B group vitamins: Secondary | ICD-10-CM | POA: Diagnosis not present

## 2020-03-04 DIAGNOSIS — Z86718 Personal history of other venous thrombosis and embolism: Secondary | ICD-10-CM | POA: Diagnosis not present

## 2020-03-04 DIAGNOSIS — E559 Vitamin D deficiency, unspecified: Secondary | ICD-10-CM | POA: Diagnosis not present

## 2020-03-11 DIAGNOSIS — E119 Type 2 diabetes mellitus without complications: Secondary | ICD-10-CM | POA: Diagnosis not present

## 2020-03-11 DIAGNOSIS — I2510 Atherosclerotic heart disease of native coronary artery without angina pectoris: Secondary | ICD-10-CM | POA: Diagnosis not present

## 2020-03-11 DIAGNOSIS — E782 Mixed hyperlipidemia: Secondary | ICD-10-CM | POA: Diagnosis not present

## 2020-03-11 DIAGNOSIS — I10 Essential (primary) hypertension: Secondary | ICD-10-CM | POA: Diagnosis not present

## 2020-03-12 ENCOUNTER — Other Ambulatory Visit: Payer: Self-pay | Admitting: Neurology

## 2020-03-12 ENCOUNTER — Other Ambulatory Visit (HOSPITAL_COMMUNITY): Payer: Self-pay | Admitting: Neurology

## 2020-03-12 DIAGNOSIS — G250 Essential tremor: Secondary | ICD-10-CM

## 2020-03-13 DIAGNOSIS — E538 Deficiency of other specified B group vitamins: Secondary | ICD-10-CM | POA: Diagnosis not present

## 2020-03-21 DIAGNOSIS — E538 Deficiency of other specified B group vitamins: Secondary | ICD-10-CM | POA: Diagnosis not present

## 2020-03-28 DIAGNOSIS — E538 Deficiency of other specified B group vitamins: Secondary | ICD-10-CM | POA: Diagnosis not present

## 2020-03-29 ENCOUNTER — Other Ambulatory Visit: Payer: Self-pay

## 2020-03-29 ENCOUNTER — Ambulatory Visit
Admission: RE | Admit: 2020-03-29 | Discharge: 2020-03-29 | Disposition: A | Discharge status: Home / Self Care | Payer: Medicare Other | Source: Ambulatory Visit | Attending: Neurology | Admitting: Neurology

## 2020-03-29 DIAGNOSIS — M2548 Effusion, other site: Secondary | ICD-10-CM | POA: Diagnosis not present

## 2020-03-29 DIAGNOSIS — I739 Peripheral vascular disease, unspecified: Secondary | ICD-10-CM | POA: Diagnosis not present

## 2020-03-29 DIAGNOSIS — R9082 White matter disease, unspecified: Secondary | ICD-10-CM | POA: Diagnosis not present

## 2020-03-29 DIAGNOSIS — G250 Essential tremor: Secondary | ICD-10-CM | POA: Diagnosis not present

## 2020-03-29 DIAGNOSIS — I6782 Cerebral ischemia: Secondary | ICD-10-CM | POA: Diagnosis not present

## 2020-04-04 DIAGNOSIS — E538 Deficiency of other specified B group vitamins: Secondary | ICD-10-CM | POA: Diagnosis not present

## 2020-04-22 DIAGNOSIS — H7011 Chronic mastoiditis, right ear: Secondary | ICD-10-CM | POA: Diagnosis not present

## 2020-04-22 DIAGNOSIS — H903 Sensorineural hearing loss, bilateral: Secondary | ICD-10-CM | POA: Diagnosis not present

## 2020-04-22 DIAGNOSIS — H6121 Impacted cerumen, right ear: Secondary | ICD-10-CM | POA: Diagnosis not present

## 2020-05-07 DIAGNOSIS — E538 Deficiency of other specified B group vitamins: Secondary | ICD-10-CM | POA: Diagnosis not present

## 2020-06-04 DIAGNOSIS — E538 Deficiency of other specified B group vitamins: Secondary | ICD-10-CM | POA: Diagnosis not present

## 2020-07-08 DIAGNOSIS — G250 Essential tremor: Secondary | ICD-10-CM | POA: Diagnosis not present

## 2020-07-08 DIAGNOSIS — Z86718 Personal history of other venous thrombosis and embolism: Secondary | ICD-10-CM | POA: Diagnosis not present

## 2020-07-08 DIAGNOSIS — H9191 Unspecified hearing loss, right ear: Secondary | ICD-10-CM | POA: Diagnosis not present

## 2020-07-09 DIAGNOSIS — E538 Deficiency of other specified B group vitamins: Secondary | ICD-10-CM | POA: Diagnosis not present

## 2020-07-21 DIAGNOSIS — H7010 Chronic mastoiditis, unspecified ear: Secondary | ICD-10-CM | POA: Diagnosis not present

## 2020-07-21 DIAGNOSIS — H6121 Impacted cerumen, right ear: Secondary | ICD-10-CM | POA: Diagnosis not present

## 2020-07-21 DIAGNOSIS — H6981 Other specified disorders of Eustachian tube, right ear: Secondary | ICD-10-CM | POA: Diagnosis not present

## 2020-07-21 DIAGNOSIS — H903 Sensorineural hearing loss, bilateral: Secondary | ICD-10-CM | POA: Diagnosis not present

## 2020-08-01 ENCOUNTER — Inpatient Hospital Stay (HOSPITAL_BASED_OUTPATIENT_CLINIC_OR_DEPARTMENT_OTHER): Payer: Medicare Other | Admitting: Oncology

## 2020-08-01 ENCOUNTER — Inpatient Hospital Stay: Payer: Medicare Other | Attending: Oncology

## 2020-08-01 ENCOUNTER — Encounter: Payer: Self-pay | Admitting: Oncology

## 2020-08-01 ENCOUNTER — Telehealth: Payer: Self-pay | Admitting: *Deleted

## 2020-08-01 VITALS — BP 98/63 | HR 70 | Temp 98.3°F | Resp 18 | Wt 217.6 lb

## 2020-08-01 DIAGNOSIS — Z7901 Long term (current) use of anticoagulants: Secondary | ICD-10-CM | POA: Diagnosis not present

## 2020-08-01 DIAGNOSIS — E119 Type 2 diabetes mellitus without complications: Secondary | ICD-10-CM | POA: Diagnosis not present

## 2020-08-01 DIAGNOSIS — E785 Hyperlipidemia, unspecified: Secondary | ICD-10-CM | POA: Diagnosis not present

## 2020-08-01 DIAGNOSIS — I10 Essential (primary) hypertension: Secondary | ICD-10-CM | POA: Insufficient documentation

## 2020-08-01 DIAGNOSIS — I2510 Atherosclerotic heart disease of native coronary artery without angina pectoris: Secondary | ICD-10-CM | POA: Diagnosis not present

## 2020-08-01 DIAGNOSIS — Z79899 Other long term (current) drug therapy: Secondary | ICD-10-CM | POA: Insufficient documentation

## 2020-08-01 DIAGNOSIS — D72829 Elevated white blood cell count, unspecified: Secondary | ICD-10-CM

## 2020-08-01 DIAGNOSIS — Z86718 Personal history of other venous thrombosis and embolism: Secondary | ICD-10-CM | POA: Diagnosis not present

## 2020-08-01 LAB — COMPREHENSIVE METABOLIC PANEL
ALT: 15 U/L (ref 0–44)
AST: 23 U/L (ref 15–41)
Albumin: 4.2 g/dL (ref 3.5–5.0)
Alkaline Phosphatase: 42 U/L (ref 38–126)
Anion gap: 15 (ref 5–15)
BUN: 18 mg/dL (ref 8–23)
CO2: 24 mmol/L (ref 22–32)
Calcium: 9.1 mg/dL (ref 8.9–10.3)
Chloride: 98 mmol/L (ref 98–111)
Creatinine, Ser: 0.91 mg/dL (ref 0.61–1.24)
GFR, Estimated: >60 mL/min (ref >60)
Glucose, Bld: 198 mg/dL — ABNORMAL HIGH (ref 70–99)
Potassium: 4 mmol/L (ref 3.5–5.1)
Sodium: 137 mmol/L (ref 135–145)
Total Bilirubin: 0.8 mg/dL (ref 0.3–1.2)
Total Protein: 7 g/dL (ref 6.5–8.1)

## 2020-08-01 LAB — CBC WITH DIFFERENTIAL/PLATELET
Abs Immature Granulocytes: 0.03 10*3/uL (ref 0.00–0.07)
Basophils Absolute: 0.1 10*3/uL (ref 0.0–0.1)
Basophils Relative: 1 %
Eosinophils Absolute: 0.2 10*3/uL (ref 0.0–0.5)
Eosinophils Relative: 3 %
HCT: 45.9 % (ref 39.0–52.0)
Hemoglobin: 15.6 g/dL (ref 13.0–17.0)
Immature Granulocytes: 0 %
Lymphocytes Relative: 32 %
Lymphs Abs: 2.8 10*3/uL (ref 0.7–4.0)
MCH: 32.3 pg (ref 26.0–34.0)
MCHC: 34 g/dL (ref 30.0–36.0)
MCV: 95 fL (ref 80.0–100.0)
Monocytes Absolute: 0.8 10*3/uL (ref 0.1–1.0)
Monocytes Relative: 9 %
Neutro Abs: 5 10*3/uL (ref 1.7–7.7)
Neutrophils Relative %: 55 %
Platelets: 248 10*3/uL (ref 150–400)
RBC: 4.83 MIL/uL (ref 4.22–5.81)
RDW: 12.6 % (ref 11.5–15.5)
WBC: 8.9 10*3/uL (ref 4.0–10.5)
nRBC: 0 % (ref 0.0–0.2)

## 2020-08-01 MED ORDER — RIVAROXABAN 10 MG PO TABS: 10.0000 mg | ORAL_TABLET | Freq: Every day | ORAL | 1 refills | Status: DC

## 2020-08-01 NOTE — Telephone Encounter (Signed)
Patient called stating that the medicine ordered by Dr Tasia Catchings is not needed as he is already on it and he wants it cancelled

## 2020-08-01 NOTE — Progress Notes (Signed)
Hematology/Oncology follow up  note [name] Telephone:(336) [phone] Fax:(336) [phone]   Patient Care Team: Georgiana Shore., PA-C as PCP - General  REASON FOR VISIT Follow up for treatment of  DVT and chronic anticoagulation  HISTORY OF PRESENTING ILLNESS:  Kenneth Gilmore is a  69 y.o.  male with PMH listed below who was referred to me for evaluation of DVT. Patient presented outpatient ultrasound of his left lower extremity showing positive for DVT. Denies any recent,, surgery, travel or issue.  Denies any family history of DVT.  Reports that he developed left lower extremity swelling for a week, associated with moderate severity throbbing aching discomfort feels like pulling a muscle.  Denies any shortness of breath. Patient was started on Xarelto starting kit and advised to follow-up with heme-onc.  Today patient reports being compliant with taking Xarelto.  Currently taking 15 mg twice daily. Tolerates well.Denies hematochezia, hematuria, hematemesis, epistaxis, black tarry stool or easy bruising.   INTERVAL HISTORY Kenneth Gilmore is a 69 y.o. 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   Patient reports no new complaints.  He reports taking aspirin 81 mg daily as well as Xarelto 10 mg daily.  No bleeding issue.      Review of Systems  Constitutional: Negative for chills, fever, malaise/fatigue and weight loss.  HENT: Negative for sore throat.   Eyes: Negative for redness.  Respiratory: Negative for cough, shortness of breath and wheezing.   Cardiovascular: Negative for chest pain, palpitations and leg swelling.  Gastrointestinal: Negative for abdominal pain, blood in stool, nausea and vomiting.  Genitourinary: Negative for dysuria.  Musculoskeletal: Negative for myalgias.  Skin: Negative for rash.  Neurological:  Negative for dizziness, tingling and tremors.  Endo/Heme/Allergies: Does not bruise/bleed easily.  Psychiatric/Behavioral: Negative for hallucinations.    MEDICAL HISTORY:  Past Medical History:  Diagnosis Date  . Diabetes mellitus without complication ([place])   . Hyperlipidemia   . Hypertension     SURGICAL HISTORY: Past Surgical History:  Procedure Laterality Date  . arm surgery Left   . CORONARY ANGIOPLASTY WITH STENT PLACEMENT    . left side rotator cuff surgery    . SHOULDER SURGERY      SOCIAL HISTORY: Social History   Socioeconomic History  . Marital status: Single    Spouse name: Not on file  . Number of children: Not on file  . Years of education: Not on file  . Highest education level: Not on file  Occupational History  . Occupation: retired  Tobacco Use  . Smoking status: Never Smoker  . Smokeless tobacco: Current User    Types: Chew  Substance and Sexual Activity  . Alcohol use: No  . Drug use: Never  . Sexual activity: Not on file  Other Topics Concern  . Not on file  Social History Narrative  . Not on file   Social Determinants of Health   Financial Resource Strain: Not on file  Food Insecurity: Not on file  Transportation Needs: Not on file  Physical Activity: Not on file  Stress: Not on file  Social Connections: Not on file  Intimate Partner Violence: Not on file    FAMILY HISTORY: Family History  Problem Relation Age of Onset  . Cancer Father        lung    ALLERGIES:  has No Known Allergies.  MEDICATIONS:  Current Outpatient Medications  Medication Sig Dispense Refill  . aspirin 81 MG tablet Take 81 mg by mouth daily.    . empagliflozin (JARDIANCE) 10 MG TABS tablet Take by mouth.    Marland Kitchen lisinopril (PRINIVIL,ZESTRIL) 5 MG tablet Take by mouth. Take 1 tab by mouth once daily    . metFORMIN (GLUCOPHAGE) 500 MG tablet Take 500 mg by mouth 2 (two) times daily.    . metoprolol tartrate (LOPRESSOR) 50 MG tablet Take 50 mg by mouth 2  (two) times daily.    . primidone (MYSOLINE) 50 MG tablet Take 2 tablets by mouth 2 (two) times daily. FOR HAND TREMORS    . simvastatin (ZOCOR) 40 MG tablet Take 40 mg by mouth daily.    . rivaroxaban (XARELTO) 10 MG TABS tablet Take 1 tablet (10 mg total) by mouth daily. 90 tablet 1   No current facility-administered medications for this visit.     PHYSICAL EXAMINATION: ECOG PERFORMANCE STATUS: 1 - Symptomatic but completely ambulatory Vitals:   08/01/20 0954  BP: 98/63  Pulse: 70  Resp: 18  Temp: 98.3 F (36.8 C)   Filed Weights   08/01/20 0954  Weight: 217 lb 9.6 oz (98.7 kg)    Physical Exam Constitutional:      General: He is not in acute distress.    Appearance: He is obese.  HENT:     Head: Normocephalic and atraumatic.  Eyes:     General: No scleral icterus.    Conjunctiva/sclera: Conjunctivae normal.     Pupils: Pupils are equal, round, and reactive to light.  Cardiovascular:     Rate and Rhythm: Normal rate and regular rhythm.     Heart sounds: Normal heart sounds.  Pulmonary:     Effort: Pulmonary effort is normal. No respiratory distress.     Breath sounds: Normal breath sounds. No wheezing or rales.  Chest:     Chest wall: No tenderness.  Abdominal:     General: Bowel sounds are normal. There is no distension.     Palpations: Abdomen is soft. There is no mass.     Tenderness: There is no abdominal tenderness.  Musculoskeletal:        General: No swelling or deformity. Normal range of motion.     Cervical back: Normal range of motion and neck supple.  Lymphadenopathy:     Cervical: No cervical adenopathy.  Skin:    General: Skin is warm and dry.     Findings: No erythema or rash.  Neurological:     Mental Status: He is alert and oriented to person, place, and time. Mental status is at baseline.     Cranial Nerves: No cranial nerve deficit.     Coordination: Coordination normal.  Psychiatric:        Mood and Affect: Mood normal.       LABORATORY DATA:  I have reviewed the data as listed Lab Results  Component Value Date   WBC 8.9 08/01/2020   HGB 15.6 08/01/2020   HCT 45.9 08/01/2020   MCV 95.0 08/01/2020   PLT 248 08/01/2020   Recent Labs    02/01/20 1341 08/01/20 0931  NA 140 137  K 4.2 4.0  CL 102 98  CO2 27 24  GLUCOSE 153* 198*  BUN 19 18  CREATININE 1.01 0.91  CALCIUM 9.0 9.1  GFRNONAA >60 >60  GFRAA >60  --   PROT 7.2 7.0  ALBUMIN 4.2 4.2  AST 21 23  ALT 19 15  ALKPHOS 49 42  BILITOT 0.7 0.8   Iron/TIBC/Ferritin/ %Sat No results found for: IRON, TIBC, FERRITIN, IRONPCTSAT      ASSESSMENT & PLAN:  1. History of femoral vein thrombosis   2. Chronic anticoagulation    #History of femoral vein thrombosis unprovoked  Recommend patient to continue Xarelto 10 mg daily for anticoagulation prophylaxis.  #history of CAD status post percutaneous coronary angioplasty.  Continue aspirin 81 m.g daily. # Leukocytosis, completely resolved.   Orders Placed This Encounter  Procedures  . CBC with Differential/Platelet    Standing Status:   Future    Standing Expiration Date:   08/01/2021  . Comprehensive metabolic panel    Standing Status:   Future    Standing Expiration Date:   08/01/2021    All questions were answered. The patient knows to call the clinic with any problems questions or concerns.  Return of visit: 6 months  Earlie Server, MD, PhD Hematology Oncology [facility] Pager- [phone] 08/01/2020

## 2020-08-01 NOTE — Progress Notes (Signed)
Pt here for follow up. No new concerns voiced.   

## 2020-08-01 NOTE — Telephone Encounter (Signed)
The rx sent today was refills for the Xaelto 10mg  not an additional medication.  Called patient to clarify with him that this was refills not new med and to NOT take them at the same time.  When he is out of the supply he has at home he may pick up the refill at pharmacy.  Patient voiced understanding.

## 2020-08-04 DIAGNOSIS — E538 Deficiency of other specified B group vitamins: Secondary | ICD-10-CM | POA: Diagnosis not present

## 2021-01-21 ENCOUNTER — Inpatient Hospital Stay (HOSPITAL_BASED_OUTPATIENT_CLINIC_OR_DEPARTMENT_OTHER): Payer: Medicare Other | Admitting: Oncology

## 2021-01-21 ENCOUNTER — Inpatient Hospital Stay: Payer: Medicare Other | Attending: Oncology

## 2021-01-21 ENCOUNTER — Encounter: Payer: Self-pay | Admitting: Oncology

## 2021-01-21 VITALS — BP 95/62 | HR 65 | Temp 98.3°F | Resp 18 | Wt 207.7 lb

## 2021-01-21 DIAGNOSIS — Z7901 Long term (current) use of anticoagulants: Secondary | ICD-10-CM | POA: Diagnosis not present

## 2021-01-21 DIAGNOSIS — Z86718 Personal history of other venous thrombosis and embolism: Secondary | ICD-10-CM

## 2021-01-21 DIAGNOSIS — Z9861 Coronary angioplasty status: Secondary | ICD-10-CM | POA: Diagnosis not present

## 2021-01-21 LAB — COMPREHENSIVE METABOLIC PANEL
ALT: 20 U/L (ref 0–44)
AST: 21 U/L (ref 15–41)
Albumin: 4.4 g/dL (ref 3.5–5.0)
Alkaline Phosphatase: 45 U/L (ref 38–126)
Anion gap: 12 (ref 5–15)
BUN: 20 mg/dL (ref 8–23)
CO2: 26 mmol/L (ref 22–32)
Calcium: 9.3 mg/dL (ref 8.9–10.3)
Chloride: 101 mmol/L (ref 98–111)
Creatinine, Ser: 0.89 mg/dL (ref 0.61–1.24)
GFR, Estimated: >60 mL/min (ref >60)
Glucose, Bld: 106 mg/dL — ABNORMAL HIGH (ref 70–99)
Potassium: 4.2 mmol/L (ref 3.5–5.1)
Sodium: 139 mmol/L (ref 135–145)
Total Bilirubin: 0.8 mg/dL (ref 0.3–1.2)
Total Protein: 7.3 g/dL (ref 6.5–8.1)

## 2021-01-21 LAB — CBC WITH DIFFERENTIAL/PLATELET
Abs Immature Granulocytes: 0.04 10*3/uL (ref 0.00–0.07)
Basophils Absolute: 0.1 10*3/uL (ref 0.0–0.1)
Basophils Relative: 1 %
Eosinophils Absolute: 0.2 10*3/uL (ref 0.0–0.5)
Eosinophils Relative: 2 %
HCT: 45.5 % (ref 39.0–52.0)
Hemoglobin: 15.9 g/dL (ref 13.0–17.0)
Immature Granulocytes: 0 %
Lymphocytes Relative: 38 %
Lymphs Abs: 3.8 10*3/uL (ref 0.7–4.0)
MCH: 33 pg (ref 26.0–34.0)
MCHC: 34.9 g/dL (ref 30.0–36.0)
MCV: 94.4 fL (ref 80.0–100.0)
Monocytes Absolute: 0.7 10*3/uL (ref 0.1–1.0)
Monocytes Relative: 7 %
Neutro Abs: 5.3 10*3/uL (ref 1.7–7.7)
Neutrophils Relative %: 52 %
Platelets: 233 10*3/uL (ref 150–400)
RBC: 4.82 MIL/uL (ref 4.22–5.81)
RDW: 12.2 % (ref 11.5–15.5)
WBC: 10.1 10*3/uL (ref 4.0–10.5)
nRBC: 0 % (ref 0.0–0.2)

## 2021-01-21 MED ORDER — RIVAROXABAN 10 MG PO TABS: 10.0000 mg | ORAL_TABLET | Freq: Every day | ORAL | 1 refills | Status: DC

## 2021-01-21 NOTE — Progress Notes (Signed)
Hematology/Oncology follow up  note [facility] Telephone:(336) [phone] Fax:(336) [phone]   Patient Care Team: Georgiana Shore., PA-C as PCP - General  REASON FOR VISIT Follow up for treatment of  DVT and chronic anticoagulation  HISTORY OF PRESENTING ILLNESS:  Kenneth Gilmore is a  70 y.o.  male with PMH listed below who was referred to me for evaluation of DVT. Patient presented outpatient ultrasound of his left lower extremity showing positive for DVT. Denies any recent,, surgery, travel or issue.  Denies any family history of DVT.  Reports that he developed left lower extremity swelling for a week, associated with moderate severity throbbing aching discomfort feels like pulling a muscle.  Denies any shortness of breath. Patient was started on Xarelto starting kit and advised to follow-up with heme-onc.  Today patient reports being compliant with taking Xarelto.  Currently taking 15 mg twice daily. Tolerates well.Denies hematochezia, hematuria, hematemesis, epistaxis, black tarry stool or easy bruising.   INTERVAL HISTORY Kenneth Gilmore is a 70 y.o. 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   Patient reports no new complaints.  Taking aspirin 81 mg daily as well as Xarelto 10 mg daily. Denies any easy bruising or active bleeding events. No lower extremity tenderness.  He has some chronic lower extremity swelling which gets better after leg elevation .      Review of Systems  Constitutional: Negative for chills, fever, malaise/fatigue and weight loss.  HENT: Negative for sore throat.   Eyes: Negative for redness.  Respiratory: Negative for cough, shortness of breath and wheezing.   Cardiovascular: Negative for chest pain and palpitations.       Trace edema bilaterally  Gastrointestinal: Negative for abdominal pain, blood in  stool, nausea and vomiting.  Genitourinary: Negative for dysuria.  Musculoskeletal: Negative for myalgias.  Skin: Negative for rash.  Neurological: Negative for dizziness, tingling and tremors.  Endo/Heme/Allergies: Does not bruise/bleed easily.  Psychiatric/Behavioral: Negative for hallucinations.    MEDICAL HISTORY:  Past Medical History:  Diagnosis Date  . Diabetes mellitus without complication ([place])   . Hyperlipidemia   . Hypertension     SURGICAL HISTORY: Past Surgical History:  Procedure Laterality Date  . arm surgery Left   . CORONARY ANGIOPLASTY WITH STENT PLACEMENT    . left side rotator cuff surgery    . SHOULDER SURGERY      SOCIAL HISTORY: Social History   Socioeconomic History  . Marital status: Single    Spouse name: Not on file  . Number of children: Not on file  . Years of education: Not on file  . Highest education level: Not on file  Occupational History  . Occupation: retired  Tobacco Use  . Smoking status: Never Smoker  . Smokeless tobacco: Current User    Types: Chew  Substance and Sexual Activity  . Alcohol use: No  . Drug use: Never  . Sexual activity: Not on file  Other Topics Concern  . Not on file  Social History Narrative  . Not on file   Social Determinants of Health   Financial Resource Strain: Not on file  Food Insecurity: Not on file  Transportation Needs: Not on file  Physical Activity: Not on file  Stress: Not on file  Social Connections: Not on file  Intimate Partner Violence: Not on file    FAMILY HISTORY: Family History  Problem Relation Age of Onset  . Cancer Father        lung    ALLERGIES:  has No Known Allergies.  MEDICATIONS:  Current Outpatient Medications  Medication Sig Dispense Refill  . aspirin 81 MG tablet Take 81 mg by mouth daily.    . Cholecalciferol 25 MCG (1000 UT) tablet Take 1 tablet by mouth daily.    . empagliflozin (JARDIANCE) 10 MG TABS tablet Take by mouth.    Marland Kitchen lisinopril  (PRINIVIL,ZESTRIL) 5 MG tablet Take by mouth. Take 1 tab by mouth once daily    . metFORMIN (GLUCOPHAGE) 500 MG tablet Take 500 mg by mouth 2 (two) times daily.    . metoprolol tartrate (LOPRESSOR) 50 MG tablet Take 50 mg by mouth 2 (two) times daily.    . primidone (MYSOLINE) 50 MG tablet Take 0.5 tablets by mouth 2 (two) times daily. FOR HAND TREMORS    . simvastatin (ZOCOR) 40 MG tablet Take 40 mg by mouth daily.    . vitamin B-12 (CYANOCOBALAMIN) 1000 MCG tablet Take 1,000 mcg by mouth daily.    . rivaroxaban (XARELTO) 10 MG TABS tablet Take 1 tablet (10 mg total) by mouth daily. 90 tablet 1   No current facility-administered medications for this visit.     PHYSICAL EXAMINATION: ECOG PERFORMANCE STATUS: 1 - Symptomatic but completely ambulatory Vitals:   01/21/21 1431  BP: 95/62  Pulse: 65  Resp: 18  Temp: 98.3 F (36.8 C)   Filed Weights   01/21/21 1431  Weight: 207 lb 11.2 oz (94.2 kg)    Physical Exam Constitutional:      General: He is not in acute distress.    Appearance: He is obese.  HENT:     Head: Normocephalic and atraumatic.  Eyes:     General: No scleral icterus.    Conjunctiva/sclera: Conjunctivae normal.     Pupils: Pupils are equal, round, and reactive to light.  Cardiovascular:     Rate and Rhythm: Normal rate and regular rhythm.     Heart sounds: Normal heart sounds.  Pulmonary:     Effort: Pulmonary effort is normal. No respiratory distress.     Breath sounds: Normal breath sounds. No wheezing or rales.  Chest:     Chest wall: No tenderness.  Abdominal:     General: Bowel sounds are normal. There is no distension.     Palpations: Abdomen is soft. There is no mass.     Tenderness: There is no abdominal tenderness.  Musculoskeletal:        General: No swelling or deformity. Normal range of motion.     Cervical back: Normal range of motion and neck supple.  Lymphadenopathy:     Cervical: No cervical adenopathy.  Skin:    General: Skin is warm  and dry.     Findings: No erythema or rash.  Neurological:     Mental Status: He is alert and oriented to person, place, and time. Mental status is at baseline.     Cranial Nerves: No cranial nerve deficit.     Coordination: Coordination normal.  Psychiatric:        Mood and Affect: Mood normal.      LABORATORY DATA:  I have reviewed the data as listed Lab Results  Component Value Date   WBC 10.1 01/21/2021   HGB 15.9 01/21/2021   HCT 45.5 01/21/2021   MCV 94.4 01/21/2021   PLT 233 01/21/2021   Recent Labs    02/01/20  1341 08/01/20 0931 01/21/21 1348  NA 140 137 139  K 4.2 4.0 4.2  CL 102 98 101  CO2 27 24 26   GLUCOSE 153* 198* 106*  BUN 19 18 20   CREATININE 1.01 0.91 0.89  CALCIUM 9.0 9.1 9.3  GFRNONAA >60 >60 >60  GFRAA >60  --   --   PROT 7.2 7.0 7.3  ALBUMIN 4.2 4.2 4.4  AST 21 23 21   ALT 19 15 20   ALKPHOS 49 42 45  BILITOT 0.7 0.8 0.8   Iron/TIBC/Ferritin/ %Sat No results found for: IRON, TIBC, FERRITIN, IRONPCTSAT      ASSESSMENT & PLAN:  1. History of femoral vein thrombosis   2. Chronic anticoagulation   3. H/O coronary angioplasty    #History of femoral vein thrombosis unprovoked  Labs reviewed and discussed with patient Hemoglobin stable. Recommend patient to continue long-term Xarelto 10 mg daily for prophylaxis.  Refills were sent. .    #history of CAD status post percutaneous coronary angioplasty.  Continue 81 mg of aspirin daily    All questions were answered. The patient knows to call the clinic with any problems questions or concerns.  Return of visit: 6 months lab MD -CBC CMP  Earlie Server, MD, PhD Hematology Oncology [facility] Pager- [phone] 01/21/2021

## 2021-01-21 NOTE — Progress Notes (Signed)
Pt here for follow up. No new concerns voiced.   

## 2021-01-22 ENCOUNTER — Ambulatory Visit: Payer: Medicare Other | Admitting: Oncology

## 2021-01-22 ENCOUNTER — Other Ambulatory Visit: Payer: Medicare Other

## 2021-01-27 ENCOUNTER — Other Ambulatory Visit: Payer: Medicare Other

## 2021-01-27 ENCOUNTER — Ambulatory Visit: Payer: Medicare Other | Admitting: Oncology

## 2021-02-18 DIAGNOSIS — E559 Vitamin D deficiency, unspecified: Secondary | ICD-10-CM | POA: Diagnosis not present

## 2021-02-18 DIAGNOSIS — E538 Deficiency of other specified B group vitamins: Secondary | ICD-10-CM | POA: Diagnosis not present

## 2021-03-20 DIAGNOSIS — E559 Vitamin D deficiency, unspecified: Secondary | ICD-10-CM | POA: Diagnosis not present

## 2021-03-20 DIAGNOSIS — E538 Deficiency of other specified B group vitamins: Secondary | ICD-10-CM | POA: Diagnosis not present

## 2021-03-23 DIAGNOSIS — I2510 Atherosclerotic heart disease of native coronary artery without angina pectoris: Secondary | ICD-10-CM | POA: Diagnosis not present

## 2021-03-23 DIAGNOSIS — E782 Mixed hyperlipidemia: Secondary | ICD-10-CM | POA: Diagnosis not present

## 2021-03-23 DIAGNOSIS — I10 Essential (primary) hypertension: Secondary | ICD-10-CM | POA: Diagnosis not present

## 2021-03-23 DIAGNOSIS — E119 Type 2 diabetes mellitus without complications: Secondary | ICD-10-CM | POA: Diagnosis not present

## 2021-07-22 ENCOUNTER — Other Ambulatory Visit: Payer: Self-pay | Admitting: *Deleted

## 2021-07-22 DIAGNOSIS — Z86718 Personal history of other venous thrombosis and embolism: Secondary | ICD-10-CM

## 2021-07-24 ENCOUNTER — Ambulatory Visit: Payer: Medicare Other | Admitting: Oncology

## 2021-07-24 ENCOUNTER — Other Ambulatory Visit: Payer: Medicare Other

## 2021-07-27 ENCOUNTER — Inpatient Hospital Stay: Payer: Medicare Other | Admitting: Nurse Practitioner

## 2021-07-27 ENCOUNTER — Inpatient Hospital Stay: Payer: Medicare Other

## 2021-10-15 DIAGNOSIS — G250 Essential tremor: Secondary | ICD-10-CM | POA: Diagnosis not present

## 2021-10-15 DIAGNOSIS — E538 Deficiency of other specified B group vitamins: Secondary | ICD-10-CM | POA: Diagnosis not present

## 2021-10-15 DIAGNOSIS — E559 Vitamin D deficiency, unspecified: Secondary | ICD-10-CM | POA: Diagnosis not present

## 2021-10-22 DIAGNOSIS — Z86718 Personal history of other venous thrombosis and embolism: Secondary | ICD-10-CM | POA: Diagnosis not present

## 2021-10-22 DIAGNOSIS — G250 Essential tremor: Secondary | ICD-10-CM | POA: Diagnosis not present

## 2021-12-01 IMAGING — MR MR HEAD W/O CM
12 series · 43 of 48 positions shown · non-contrast
Comparison: None.

CLINICAL DATA: Hand tremors, left worse than right.

EXAM:
MRI HEAD WITHOUT CONTRAST
TECHNIQUE: Multiplanar, multiecho pulse sequences of the brain and surrounding
structures were obtained without intravenous contrast.

[Series 5: ax dwi_tracew · axial · 3.0mm · 0.60mm/px · z∈[-73,+82]mm · 5 of 96 slices shown]
[im 1/96]
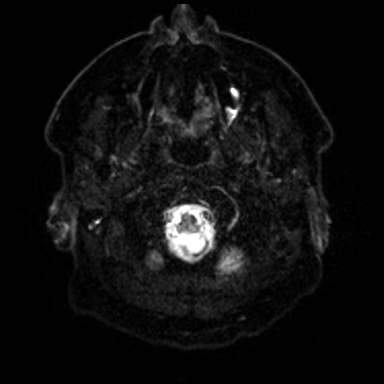
[im 24/96]
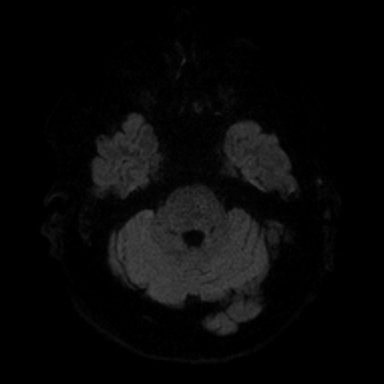
[im 48/96]
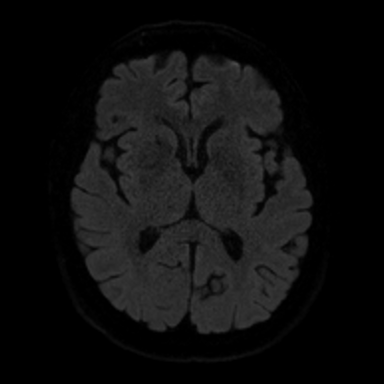
[im 72/96]
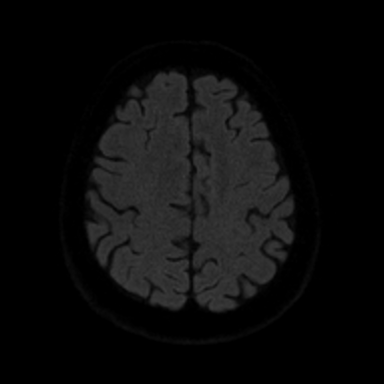
[im 96/96]
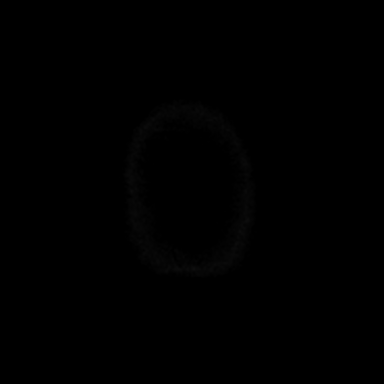

[Series 6: ax dwi_adc · axial · 3.0mm · 0.60mm/px · z∈[-73,+82]mm · 2 of 48 slices shown]
[im 1/48]
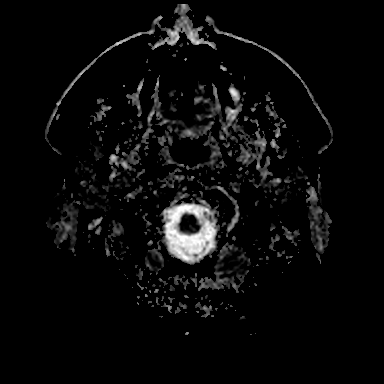
[im 48/48]
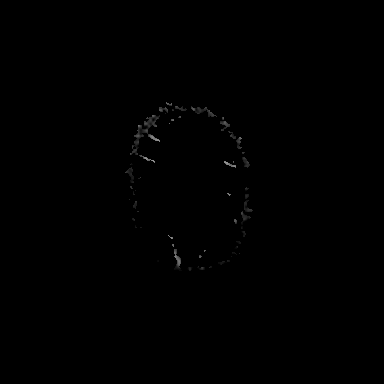

[Series 7: cor dwi_tracew · coronal · 5.0mm · 0.60mm/px · 5 of 80 slices shown]
[im 1/80]
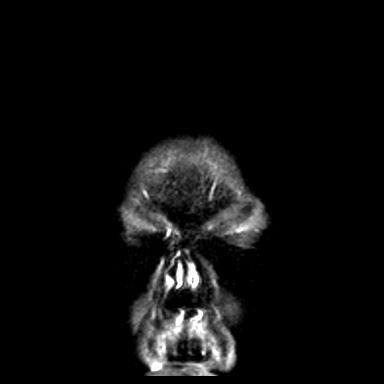
[im 20/80]
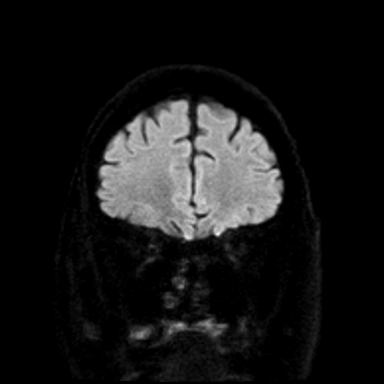
[im 40/80]
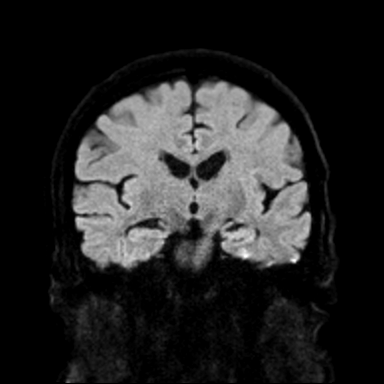
[im 60/80]
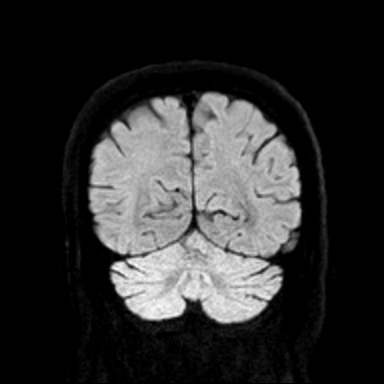
[im 80/80]
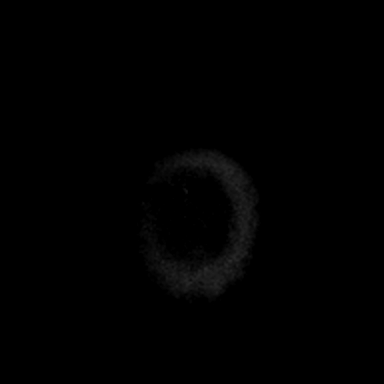

[Series 8: cor dwi_adc · coronal · 5.0mm · 0.60mm/px · 3 of 40 slices shown]
[im 1/40]
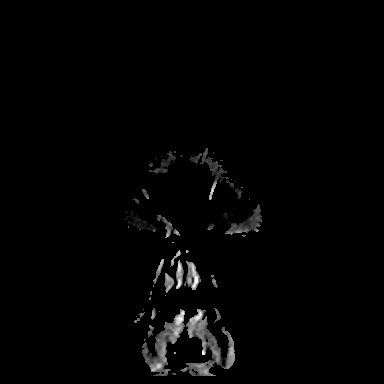
[im 20/40]
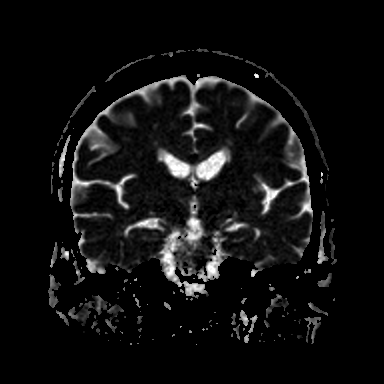
[im 40/40]
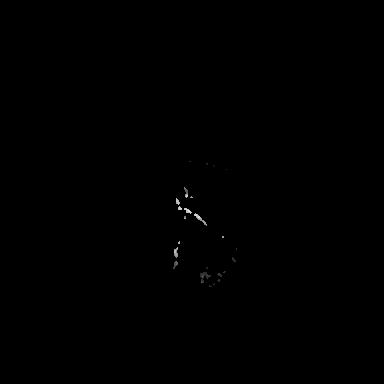

[Series 9: T1 · sagittal · 5.0mm · 0.62mm/px · 2 of 25 slices shown (1 of 2)]
[im 1/25]
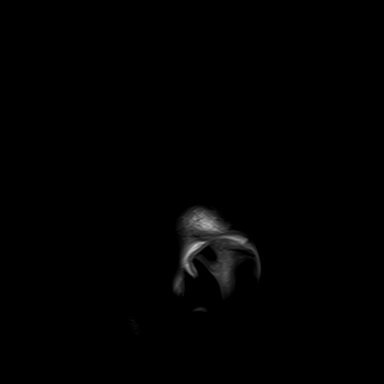
[im 25/25]
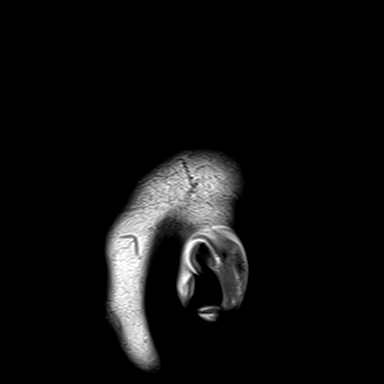

[Series 10: T2 · axial · 5.0mm · 0.53mm/px · z∈[-67,+76]mm · 2 of 25 slices shown (1 of 2)]
[im 1/25]
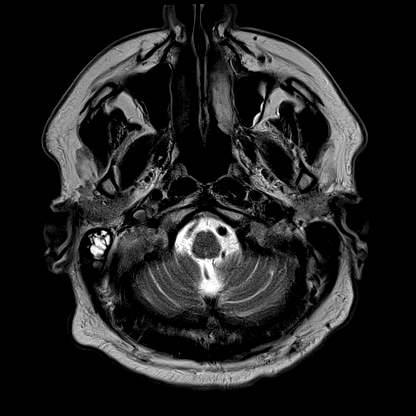
[im 25/25]
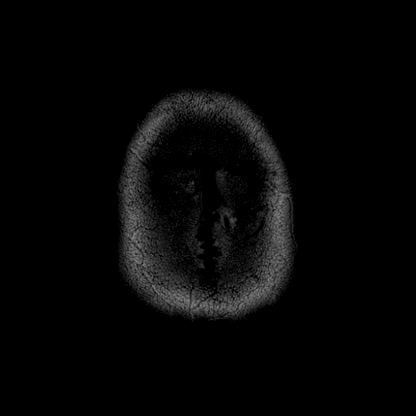

[Series 11: mag_images · axial · 3.0mm · 0.90mm/px · z∈[-84,+93]mm · 4 of 60 slices shown]
[im 1/60]
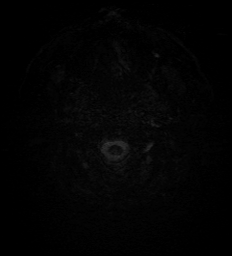
[im 20/60]
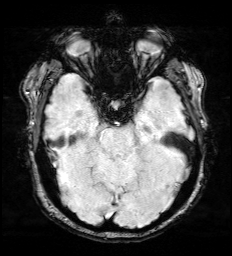
[im 40/60]
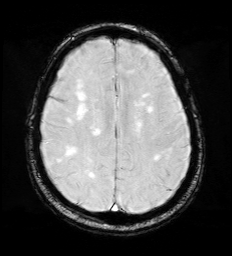
[im 60/60]
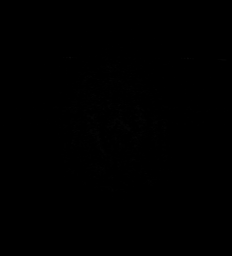

[Series 12: pha_images · axial · 3.0mm · 0.90mm/px · z∈[-84,+90]mm · 4 of 59 slices shown]
[im 1/59]
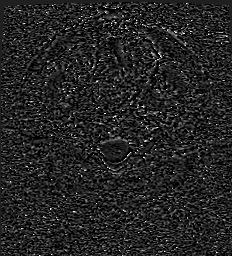
[im 20/59]
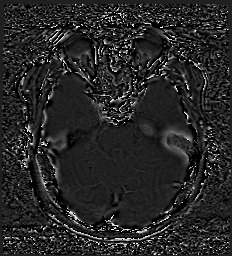
[im 39/59]
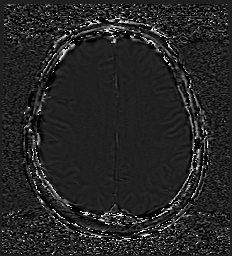
[im 59/59]
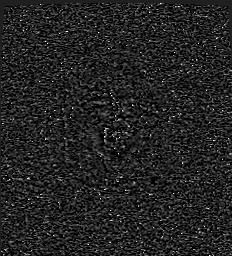

[Series 13: swi_images · axial · 3.0mm · 0.90mm/px · z∈[-84,-27]mm · 2 of 60 slices shown]
[im 1/60]
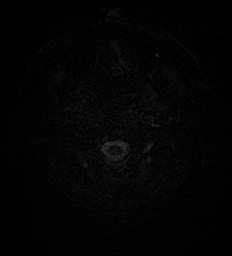
[im 20/60]
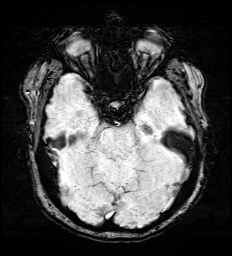

[Series 15: FLAIR · axial · 3.0mm · 0.53mm/px · z∈[-76,+85]mm · 4 of 55 slices shown]
[im 1/55]
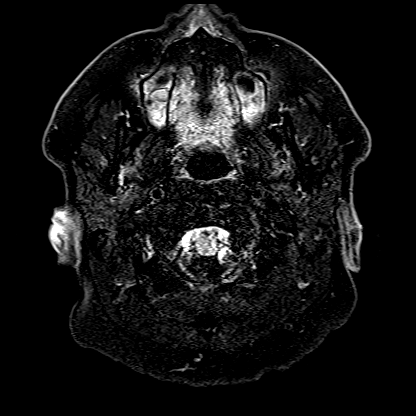
[im 19/55]
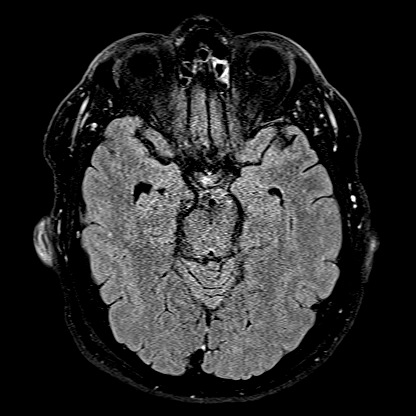
[im 37/55]
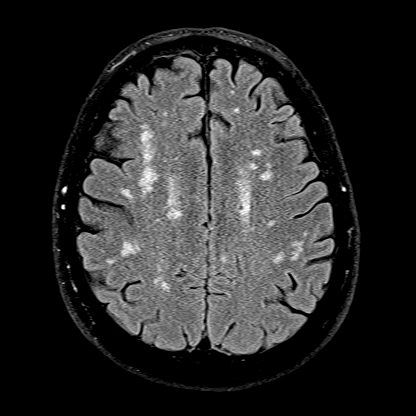
[im 55/55]
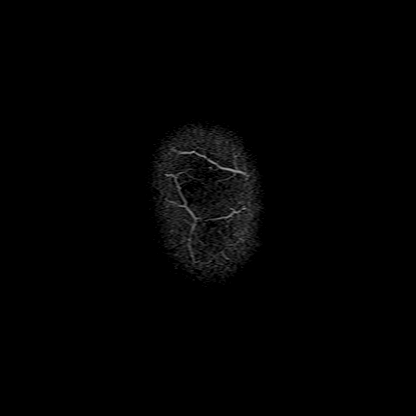

[Series 16: T1 · axial · 1.0mm · 0.98mm/px · z∈[-83,+92]mm · 8 of 176 slices shown (2 of 2)]
[im 1/176]
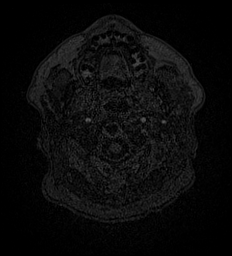
[im 36/176]
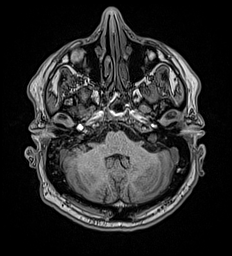
[im 53/176]
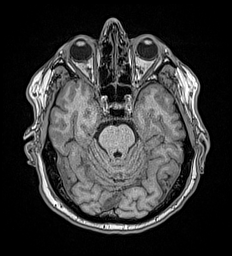
[im 71/176]
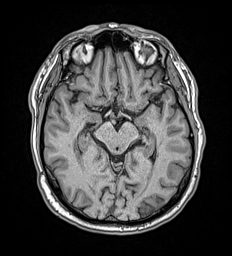
[im 106/176]
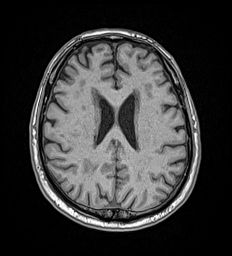
[im 123/176]
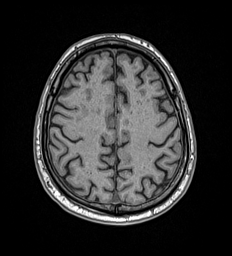
[im 141/176]
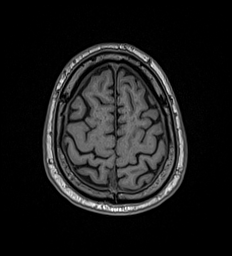
[im 176/176]
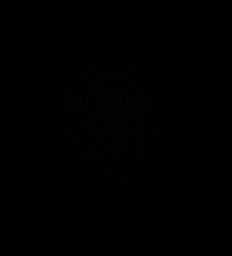

[Series 17: T2 · coronal · 5.0mm · 0.57mm/px · 2 of 29 slices shown (2 of 2)]
[im 1/29]
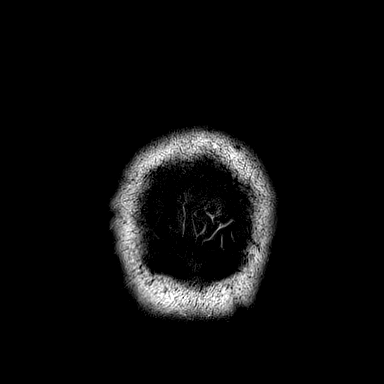
[im 29/29]
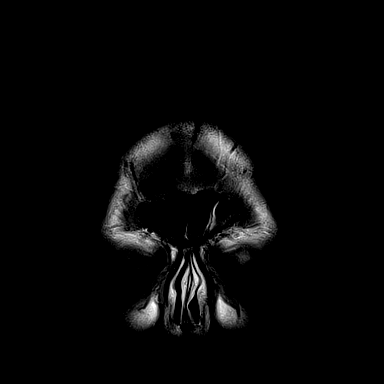

[43 of 48 positions shown; findings below may reference images not displayed]

FINDINGS: BRAIN: No acute infarct, acute hemorrhage or extra-axial collection.
Multifocal white matter hyperintensity, most commonly due to chronic
ischemic microangiopathy. Normal volume of CSF spaces. No chronic
microhemorrhage. Normal midline structures.

VASCULAR: Major flow voids are preserved.

SKULL AND UPPER CERVICAL SPINE: Normal calvarium and skull base.
Visualized upper cervical spine and soft tissues are normal.

SINUSES/ORBITS: Right mastoid effusion.  Normal orbits.
IMPRESSION: 1. No acute intracranial abnormality.
2. Findings of chronic small vessel disease.
3. Right mastoid effusion.

## 2022-09-03 DIAGNOSIS — H6123 Impacted cerumen, bilateral: Secondary | ICD-10-CM | POA: Diagnosis not present

## 2022-09-03 DIAGNOSIS — J039 Acute tonsillitis, unspecified: Secondary | ICD-10-CM | POA: Diagnosis not present

## 2022-09-03 DIAGNOSIS — H6501 Acute serous otitis media, right ear: Secondary | ICD-10-CM | POA: Diagnosis not present

## 2022-09-03 DIAGNOSIS — R070 Pain in throat: Secondary | ICD-10-CM | POA: Diagnosis not present

## 2022-09-20 DIAGNOSIS — R599 Enlarged lymph nodes, unspecified: Secondary | ICD-10-CM | POA: Diagnosis not present

## 2022-09-20 DIAGNOSIS — D3705 Neoplasm of uncertain behavior of pharynx: Secondary | ICD-10-CM | POA: Diagnosis not present

## 2022-09-21 ENCOUNTER — Other Ambulatory Visit: Payer: Self-pay | Admitting: Otolaryngology

## 2022-09-21 DIAGNOSIS — R221 Localized swelling, mass and lump, neck: Secondary | ICD-10-CM

## 2022-09-21 DIAGNOSIS — D3705 Neoplasm of uncertain behavior of pharynx: Secondary | ICD-10-CM

## 2022-09-28 ENCOUNTER — Ambulatory Visit
Admission: RE | Admit: 2022-09-28 | Discharge: 2022-09-28 | Disposition: A | Discharge status: Home / Self Care | Payer: Medicare Other | Source: Ambulatory Visit | Attending: Otolaryngology | Admitting: Otolaryngology

## 2022-09-28 DIAGNOSIS — R221 Localized swelling, mass and lump, neck: Secondary | ICD-10-CM

## 2022-09-28 DIAGNOSIS — D3705 Neoplasm of uncertain behavior of pharynx: Secondary | ICD-10-CM

## 2022-09-28 DIAGNOSIS — J392 Other diseases of pharynx: Secondary | ICD-10-CM | POA: Diagnosis not present

## 2022-09-28 DIAGNOSIS — C029 Malignant neoplasm of tongue, unspecified: Secondary | ICD-10-CM | POA: Diagnosis not present

## 2022-09-28 DIAGNOSIS — R590 Localized enlarged lymph nodes: Secondary | ICD-10-CM | POA: Diagnosis not present

## 2022-09-28 MED ORDER — IOPAMIDOL (ISOVUE-300) INJECTION 61%
75.0000 mL | Freq: Once | INTRAVENOUS | Status: AC | PRN
  Administered 2022-09-28: 75 mL via INTRAVENOUS

## 2022-10-08 ENCOUNTER — Other Ambulatory Visit: Payer: Self-pay | Admitting: Otolaryngology

## 2022-10-08 DIAGNOSIS — R599 Enlarged lymph nodes, unspecified: Secondary | ICD-10-CM

## 2022-10-14 ENCOUNTER — Other Ambulatory Visit: Payer: Self-pay | Admitting: Radiology

## 2022-10-15 ENCOUNTER — Ambulatory Visit
Admission: RE | Admit: 2022-10-15 | Discharge: 2022-10-15 | Disposition: A | Discharge status: Home / Self Care | Payer: Medicare Other | Source: Ambulatory Visit | Attending: Otolaryngology | Admitting: Otolaryngology

## 2022-10-15 DIAGNOSIS — R599 Enlarged lymph nodes, unspecified: Secondary | ICD-10-CM

## 2022-10-15 DIAGNOSIS — R590 Localized enlarged lymph nodes: Secondary | ICD-10-CM | POA: Diagnosis not present

## 2022-10-15 DIAGNOSIS — D3702 Neoplasm of uncertain behavior of tongue: Secondary | ICD-10-CM | POA: Insufficient documentation

## 2022-10-15 DIAGNOSIS — Z85810 Personal history of malignant neoplasm of tongue: Secondary | ICD-10-CM | POA: Diagnosis not present

## 2022-10-15 DIAGNOSIS — C44329 Squamous cell carcinoma of skin of other parts of face: Secondary | ICD-10-CM | POA: Diagnosis not present

## 2022-10-15 MED ORDER — LIDOCAINE HCL (PF) 1 % IJ SOLN
10.0000 mL | Freq: Once | INTRAMUSCULAR | Status: AC
  Administered 2022-10-15: 10 mL via INTRADERMAL

## 2022-10-15 NOTE — Procedures (Signed)
  Procedure:  [place] core biopsy R cervical LAN 18g Preprocedure diagnosis: The encounter diagnosis was Enlarged lymph nodes. Postprocedure diagnosis: same EBL:    minimal Complications:   none immediate  See full dictation in [employer].  Dillard Cannon MD Main # [phone] Pager  [phone] Mobile [phone]

## 2022-10-19 LAB — SURGICAL PATHOLOGY

## 2022-10-21 ENCOUNTER — Other Ambulatory Visit: Payer: Self-pay | Admitting: Otolaryngology

## 2022-10-21 DIAGNOSIS — D490 Neoplasm of unspecified behavior of digestive system: Secondary | ICD-10-CM

## 2022-10-21 NOTE — H&P (Incomplete)
Chief Complaint: Right cervical adenopathy. Request of for right cervical lymph node biopsy   Referring Physician(s): Bennett,Paul  Supervising Physician: {Supervising Physician:[id number]}  Patient Status: [facility] - Out-pt  History of Present Illness: Kenneth Gilmore is a 72 y.o. male ([place] Patient)  History of HTN, HLD, DM, metastatic tongue squamous cell carcinoma. Found to have right sided cervical adenopathy.  IR performed a right cervical lymph node biopsy on 2.23.24. Cytology that resulted as non diagnostic. Patient presents for right cervical neck lymph node biopsy.   Past Medical History:  Diagnosis Date   Diabetes mellitus without complication ([place])    Hyperlipidemia    Hypertension     Past Surgical History:  Procedure Laterality Date   arm surgery Left    CORONARY ANGIOPLASTY WITH STENT PLACEMENT     left side rotator cuff surgery     SHOULDER SURGERY      Allergies: Patient has no known allergies.  Medications: Prior to Admission medications   Medication Sig Start Date End Date Taking? Authorizing Provider  aspirin 81 MG tablet Take 81 mg by mouth daily.    [provider]  Cholecalciferol 25 MCG (1000 UT) tablet Take 1 tablet by mouth daily. 09/06/20   [provider]  empagliflozin (JARDIANCE) 10 MG TABS tablet Take by mouth.    [provider]  lisinopril (PRINIVIL,ZESTRIL) 5 MG tablet Take by mouth. Take 1 tab by mouth once daily 03/15/17   [provider]  metFORMIN (GLUCOPHAGE) 500 MG tablet Take 500 mg by mouth 2 (two) times daily.    [provider]  metoprolol tartrate (LOPRESSOR) 50 MG tablet Take 50 mg by mouth 2 (two) times daily. 03/15/17   [provider]  primidone (MYSOLINE) 50 MG tablet Take 0.5 tablets by mouth 2 (two) times daily. FOR HAND TREMORS 03/04/20 01/21/21  [provider]  rivaroxaban (XARELTO) 10 MG TABS tablet Take 1 tablet (10 mg total) by mouth daily. 01/21/21   Earlie Server, MD   simvastatin (ZOCOR) 40 MG tablet Take 40 mg by mouth daily. 03/15/17   [provider]  vitamin B-12 (CYANOCOBALAMIN) 1000 MCG tablet Take 1,000 mcg by mouth daily.    [provider]     Family History  Problem Relation Age of Onset   Cancer Father        lung    Social History   Socioeconomic History   Marital status: Single    Spouse name: Not on file   Number of children: Not on file   Years of education: Not on file   Highest education level: Not on file  Occupational History   Occupation: retired  Tobacco Use   Smoking status: Never   Smokeless tobacco: Current    Types: Chew  Substance and Sexual Activity   Alcohol use: No   Drug use: Never   Sexual activity: Not on file  Other Topics Concern   Not on file  Social History Narrative   Not on file   Social Determinants of Health   Financial Resource Strain: Not on file  Food Insecurity: Not on file  Transportation Needs: Not on file  Physical Activity: Not on file  Stress: Not on file  Social Connections: Not on file    ECOG Status: {CHL ONC ECOG [id number]  Review of Systems: A 12 point ROS discussed and pertinent positives are indicated in the HPI above.  All other systems are negative.  Review of Systems  Vital Signs: There were  no vitals taken for this visit.  Advance Care Plan: {Advance Care [id number]    Physical Exam  Imaging: [place] CORE BIOPSY (LYMPH NODES)  Result Date: 10/15/2022 INDICATION: Tongue mass.  Right cervical adenopathy. EXAM: ULTRASOUND GUIDED CORE BIOPSY OF RIGHT CERVICAL ADENOPATHY MEDICATIONS: No periprocedural antibiotics were indicated ANESTHESIA/SEDATION: Lidocaine 1% subcutaneous PROCEDURE: The procedure, risks, benefits, and alternatives were explained to the patient. Questions regarding the procedure were encouraged and answered. The patient understands and consents to the procedure. the dominant enlarged right jugular digastric node seen previously  on CT with calcifications was localized and an appropriate skin entry site was determined and marked. The operative field was prepped with chlorhexidine in a sterile fashion, and a sterile drape was applied covering the operative field. A sterile gown and sterile gloves were used for the procedure. Local anesthesia was provided with 1% Lidocaine. Under real-time ultrasound guidance, multiple coaxial 18-gauge core semi automated biopsy samples were obtained, submitted in formalin to surgical pathology. The guide needle was removed. Postprocedure scanning shows no hemorrhage or other apparent complication. The patient tolerated the procedure well. COMPLICATIONS: None immediate. FINDINGS: Hypoechoic right cervical adenopathy localized. Representative core biopsy samples obtained as above. IMPRESSION: Technically successful ultrasound-guided core biopsy, right cervical adenopathy. Electronically Signed   By: Lucrezia Europe M.D.   On: 10/15/2022 17:50   CT SOFT TISSUE NECK W CONTRAST  Result Date: 09/30/2022 CLINICAL DATA:  Right cheek pain with tongue pain and ear pain for 2 months EXAM: CT NECK WITH CONTRAST TECHNIQUE: Multidetector CT imaging of the neck was performed using the standard protocol following the bolus administration of intravenous contrast. RADIATION DOSE REDUCTION: This exam was performed according to the departmental dose-optimization program which includes automated exposure control, adjustment of the mA and/or kV according to patient size and/or use of iterative reconstruction technique. CONTRAST:  65m ISOVUE-300 IOPAMIDOL (ISOVUE-300) INJECTION 61% COMPARISON:  None Available. FINDINGS: Pharynx and larynx: Infiltrating mass in the right tongue measuring up to 4 cm. Prominent submucosal involvement including in the right parapharyngeal fat. Assessment of relationship to the pterygoids and extrinsic tongue musculature is somewhat hindered by streak artifact from dental amalgam. Salivary glands: No  mass or inflammation is seen. The tumor is in close proximity to the upper and medial aspect of the right submandibular gland Thyroid: Normal Lymph nodes: Enlarged heterogeneous lymph nodes in the upper right jugular chain with partially calcified jugulodigastric node measuring 2 cm. No adenopathy seen below the level of the hyoid. There is lateral retropharyngeal adenopathy on the right. No contralateral nodal disease is seen. Vascular: Atheromatous calcifications. Limited intracranial: Negative Visualized orbits: Minimal coverage is negative Mastoids and visualized paranasal sinuses: Essentially clear Skeleton: No erosion seen along the medial right mandible. No hematogenous bony metastasis seen. Upper chest: Clear apical lungs Other: These results will be called to the ordering clinician or representative by the Radiologist Assistant, and communication documented in the PACS or [employer] IMPRESSION: Findings compatible with squamous cell carcinoma in the right posterior tongue with multiple ipsilateral nodal metastases. The primary mass measures at least 4 cm and infiltrates parapharyngeal fat, visualization of tumor extent is limited by artifact from dental amalgam. Electronically Signed   By: JJorje GuildM.D.   On: 09/30/2022 07:57    Labs:  CBC: No results for input(s): "WBC", "HGB", "HCT", "PLT" in the last 8760 hours.  COAGS: No results for input(s): "INR", "APTT" in the last 8760 hours.  BMP: No results for input(s): "NA", "K", "CL", "CO2", "GLUCOSE", "BUN", "  CALCIUM", "CREATININE", "GFRNONAA", "GFRAA" in the last 8760 hours.  Invalid input(s): "CMP"  LIVER FUNCTION TESTS: No results for input(s): "BILITOT", "AST", "ALT", "ALKPHOS", "PROT", "ALBUMIN" in the last 8760 hours.   Assessment and Plan:  72 y.o. male ([place] Patient)  History of HTN, HLD, DM, metastatic tongue squamous cell carcinoma. Found to have right sided cervical adenopathy.  IR performed a right cervical lymph  node biopsy on 2.23.24. Cytology that resulted as non diagnostic. Patient presents for right cervical neck lymph node biopsy.   No recent labs. Patient is on 81 mg of ASA. Patient has been NPO since midnight.   Risks and benefits of right cervical lymph node biopsy  was discussed with the patient and/or patient's family including, but not limited to bleeding, infection, damage to adjacent structures or low yield requiring additional tests.  All of the questions were answered and there is agreement to proceed.  Consent signed and in chart.   Thank you for this interesting consult.  I greatly enjoyed meeting ROOSVELT AFRIDI and look forward to participating in their care.  A copy of this report was sent to the requesting provider on this date.  Electronically Signed: Jacqualine Mau, NP 10/21/2022, 6:32 PM   I spent a total of {New ZM:[id number] {New Out-Pt:[id number]}  {Established Out-Pt:[id number]} in face to face in clinical consultation, greater than 50% of which was counseling/coordinating care for ***

## 2022-10-22 ENCOUNTER — Ambulatory Visit
Admission: RE | Admit: 2022-10-22 | Discharge: 2022-10-22 | Disposition: A | Discharge status: Home / Self Care | Payer: Medicare Other | Source: Ambulatory Visit | Attending: Otolaryngology | Admitting: Otolaryngology

## 2022-10-22 DIAGNOSIS — D490 Neoplasm of unspecified behavior of digestive system: Secondary | ICD-10-CM | POA: Diagnosis present

## 2022-10-22 DIAGNOSIS — C770 Secondary and unspecified malignant neoplasm of lymph nodes of head, face and neck: Secondary | ICD-10-CM | POA: Diagnosis not present

## 2022-10-22 DIAGNOSIS — R590 Localized enlarged lymph nodes: Secondary | ICD-10-CM | POA: Diagnosis not present

## 2022-10-22 DIAGNOSIS — C01 Malignant neoplasm of base of tongue: Secondary | ICD-10-CM | POA: Diagnosis not present

## 2022-10-22 MED ORDER — LIDOCAINE HCL (PF) 1 % IJ SOLN
10.0000 mL | Freq: Once | INTRAMUSCULAR | Status: AC
  Administered 2022-10-22: 10 mL via INTRADERMAL

## 2022-10-26 ENCOUNTER — Other Ambulatory Visit: Payer: Self-pay | Admitting: Anatomic Pathology & Clinical Pathology

## 2022-10-26 LAB — SURGICAL PATHOLOGY

## 2022-10-28 ENCOUNTER — Other Ambulatory Visit: Payer: Medicare Other

## 2022-10-28 ENCOUNTER — Encounter: Payer: Self-pay | Admitting: Oncology

## 2022-10-28 DIAGNOSIS — C029 Malignant neoplasm of tongue, unspecified: Secondary | ICD-10-CM | POA: Insufficient documentation

## 2022-10-28 DIAGNOSIS — C109 Malignant neoplasm of oropharynx, unspecified: Secondary | ICD-10-CM | POA: Insufficient documentation

## 2022-11-02 ENCOUNTER — Inpatient Hospital Stay: Payer: Medicare Other | Attending: Oncology | Admitting: Oncology

## 2022-11-02 ENCOUNTER — Inpatient Hospital Stay: Payer: Medicare Other

## 2022-11-02 ENCOUNTER — Encounter: Payer: Self-pay | Admitting: Oncology

## 2022-11-02 VITALS — BP 117/75 | HR 75 | Temp 96.0°F | Resp 18 | Wt 203.7 lb

## 2022-11-02 DIAGNOSIS — C029 Malignant neoplasm of tongue, unspecified: Secondary | ICD-10-CM

## 2022-11-02 DIAGNOSIS — Z7901 Long term (current) use of anticoagulants: Secondary | ICD-10-CM | POA: Diagnosis not present

## 2022-11-02 DIAGNOSIS — Z86718 Personal history of other venous thrombosis and embolism: Secondary | ICD-10-CM | POA: Insufficient documentation

## 2022-11-02 DIAGNOSIS — Z7189 Other specified counseling: Secondary | ICD-10-CM

## 2022-11-02 DIAGNOSIS — C109 Malignant neoplasm of oropharynx, unspecified: Secondary | ICD-10-CM | POA: Insufficient documentation

## 2022-11-02 DIAGNOSIS — Z7982 Long term (current) use of aspirin: Secondary | ICD-10-CM | POA: Diagnosis not present

## 2022-11-02 DIAGNOSIS — Z79899 Other long term (current) drug therapy: Secondary | ICD-10-CM | POA: Insufficient documentation

## 2022-11-02 LAB — CMP (CANCER CENTER ONLY)
ALT: 16 U/L (ref 0–44)
AST: 23 U/L (ref 15–41)
Albumin: 4.2 g/dL (ref 3.5–5.0)
Alkaline Phosphatase: 53 U/L (ref 38–126)
Anion gap: 12 (ref 5–15)
BUN: 16 mg/dL (ref 8–23)
CO2: 27 mmol/L (ref 22–32)
Calcium: 9.1 mg/dL (ref 8.9–10.3)
Chloride: 100 mmol/L (ref 98–111)
Creatinine: 0.95 mg/dL (ref 0.61–1.24)
GFR, Estimated: >60 mL/min (ref >60)
Glucose, Bld: 156 mg/dL — ABNORMAL HIGH (ref 70–99)
Potassium: 4.2 mmol/L (ref 3.5–5.1)
Sodium: 139 mmol/L (ref 135–145)
Total Bilirubin: 0.5 mg/dL (ref 0.3–1.2)
Total Protein: 7.3 g/dL (ref 6.5–8.1)

## 2022-11-02 LAB — CBC WITH DIFFERENTIAL (CANCER CENTER ONLY)
Abs Immature Granulocytes: 0.05 10*3/uL (ref 0.00–0.07)
Basophils Absolute: 0.1 10*3/uL (ref 0.0–0.1)
Basophils Relative: 1 %
Eosinophils Absolute: 0.6 10*3/uL — ABNORMAL HIGH (ref 0.0–0.5)
Eosinophils Relative: 5 %
HCT: 46.1 % (ref 39.0–52.0)
Hemoglobin: 15.5 g/dL (ref 13.0–17.0)
Immature Granulocytes: 1 %
Lymphocytes Relative: 33 %
Lymphs Abs: 3.7 10*3/uL (ref 0.7–4.0)
MCH: 32.1 pg (ref 26.0–34.0)
MCHC: 33.6 g/dL (ref 30.0–36.0)
MCV: 95.4 fL (ref 80.0–100.0)
Monocytes Absolute: 0.8 10*3/uL (ref 0.1–1.0)
Monocytes Relative: 7 %
Neutro Abs: 5.9 10*3/uL (ref 1.7–7.7)
Neutrophils Relative %: 53 %
Platelet Count: 283 10*3/uL (ref 150–400)
RBC: 4.83 MIL/uL (ref 4.22–5.81)
RDW: 12.7 % (ref 11.5–15.5)
WBC Count: 11.1 10*3/uL — ABNORMAL HIGH (ref 4.0–10.5)
nRBC: 0 % (ref 0.0–0.2)

## 2022-11-02 LAB — LACTATE DEHYDROGENASE: LDH: 116 U/L (ref 98–192)

## 2022-11-02 MED ORDER — TRAMADOL HCL 50 MG PO TABS: 50.0000 mg | ORAL_TABLET | Freq: Three times a day (TID) | ORAL | 0 refills | Status: DC | PRN

## 2022-11-02 NOTE — Progress Notes (Signed)
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 If he does not have distant metastasis, recommend concurrent chemotherapy with radiation If PET scan shows distant metastasis, plan additional biopsy to confirm and patient will need systemic chemotherapy. Check CBC, CMP.  I explained to the patient the risks and benefits of chemotherapy including all but not limited to infusion reaction, hair loss, hearing loss, mouth sore, nausea, vomiting, low blood counts, bleeding, heart failure, kidney failure and risk of life threatening infection and even death, secondary malignancy etc.   Patient voices understanding and willing to proceed chemotherapy.   # Will arrange patient to have chemotherapy education; Antiemetics-Zofran and Compazine; will be sent  to pharmacy   Goals of care, counseling/discussion Discussed with patient.  History of DVT (deep vein thrombosis) Continue Xarelto 10 mg daily for prophylaxis. Patient is also on aspirin 81 mg daily for history of CAD status post percutaneous coronary angioplasty.  Orders Placed This Encounter  Procedures   NM PET Image Initial (PI) Skull Base To Thigh    Standing Status:   Future    Standing Expiration Date:   11/02/2023    Order Specific Question:   If indicated for the ordered procedure, I authorize the administration of a radiopharmaceutical per Radiology protocol    Answer:   Yes    Order Specific Question:   Preferred imaging location?    Answer:   [facility]   CBC with Differential ([place] Only)    Standing Status:   Future    Number of Occurrences:   1    Standing Expiration Date:   11/02/2023   CMP ([place] only)    Standing Status:   Future    Number of Occurrences:   1    Standing Expiration Date:   11/02/2023   Lactate dehydrogenase    Standing Status:   Future    Number of Occurrences:   1    Standing Expiration Date:   11/02/2023   Follow-up to be determined. All questions were answered. The patient knows to call the clinic with any problems, questions or concerns.  Earlie Server, MD, PhD [facility] Hematology Oncology 11/02/2022   HISTORY OF PRESENTING ILLNESS:  Patient presented outpatient ultrasound of his left lower extremity showing positive for DVT. Denies any recent,, surgery, travel or issue.  Denies any family history of DVT.  Reports that he developed left lower extremity swelling for a week, associated with moderate severity throbbing aching discomfort feels like pulling a muscle.  Denies any shortness of breath. Patient was started on Xarelto starting kit and advised to follow-up with heme-onc.  Today patient reports being compliant with taking Xarelto.  Currently he take Xarelto '10mg'$  daily. Also on Aspirin '81mg'$  daily. Tolerates well.Denies hematochezia, hematuria, hematemesis, epistaxis, black tarry stool or easy bruising.   INTERVAL HISTORY Kenneth Gilmore is a 72 y.o. male presents to re-establish care for oropharyngeal squamous cell carcinoma  Oncology History  Oropharyngeal cancer ([place])  09/28/2022 Imaging   CT soft tissue neck with contrast showed infiltrating mass in the right tongue with multiple ipsilateral  nodal metastases. The primary mass measures at least 4 cm and infiltrates parapharyngeal fat, visualization of tumor extent is limited by artifact from dental amalgam.   10/28/2022 Initial Diagnosis   Squamous cell carcinoma of tongue ([facility]) Patient has throat pain and was evaluated by ENT Dr.Bennett.  Physical examination showed  cervical lymphadenopathy.  10/15/2022 ultrasound-guided biopsy of right cervical lymph node showed fibrous tissue, detach minute salivary gland tissue.  Granular debris's and a few dysplastic squamous epithelial cells, nondiagnostic.  10/22/2022 repeat ultrasound-guided biopsy of right cervical lymph node was positive for malignancy, metastatic squamous cell carcinoma, keratinizing, in a background of dense fibrosis and tumor necrosis.   10/28/2022 Cancer Staging   Staging form: Oral Cavity, AJCC 8th Edition - Clinical: cT3, cN2a - Signed by Earlie Server, MD on 10/28/2022    Patient presented to reestablish care.  He uses dipping tobacco and tries to quit. Denies any swallowing difficulties, shortness of breath, cough, hemoptysis, unintentional weight loss bone pain..   Patient has sore throat as well as right ear pain, right ear sharp pain is intermittent, sometimes wakes him up during sleeping.  He took Tylenol without any improvement.   Review of Systems  Constitutional:  Negative for chills, fever, malaise/fatigue and weight loss.  HENT:  Negative for sore throat.        Sore throat  Eyes:  Negative for redness.  Respiratory:  Negative for cough, shortness of breath and wheezing.   Cardiovascular:  Negative for chest pain and palpitations.       Trace edema bilaterally  Gastrointestinal:  Negative for abdominal pain, blood in stool, nausea and vomiting.  Genitourinary:  Negative for dysuria.  Musculoskeletal:  Negative for myalgias.  Skin:  Negative for rash.  Neurological:  Negative for dizziness, tingling and tremors.  Endo/Heme/Allergies:  Does not bruise/bleed easily.  Psychiatric/Behavioral:  Negative for hallucinations.     MEDICAL HISTORY:  Past Medical History:  Diagnosis Date   Diabetes mellitus without complication ([place])    Hyperlipidemia    Hypertension     SURGICAL HISTORY: Past Surgical History:  Procedure Laterality Date   arm surgery Left    CORONARY ANGIOPLASTY  WITH STENT PLACEMENT     left side rotator cuff surgery     SHOULDER SURGERY      SOCIAL HISTORY: Social History   Socioeconomic History   Marital status: Single    Spouse name: Not on file   Number of children: Not on file   Years of education: Not on file   Highest education level: Not on file  Occupational History   Occupation: retired  Tobacco Use   Smoking status: Never   Smokeless tobacco: Current    Types: Chew  Substance and Sexual Activity   Alcohol use: No   Drug use: Never   Sexual activity: Not on file  Other Topics Concern   Not on file  Social History Narrative   Not on file   Social Determinants of Health   Financial Resource Strain: Not on file  Food Insecurity: Not on file  Transportation Needs: Not on file  Physical Activity: Not on file  Stress: Not on file  Social Connections: Not on file  Intimate Partner Violence: Not on file    FAMILY HISTORY: Family History  Problem Relation Age of Onset   Cancer Father        lung    ALLERGIES:  has No Known Allergies.  MEDICATIONS:  Current Outpatient Medications  Medication Sig Dispense Refill  aspirin 81 MG tablet Take 81 mg by mouth daily.     Cholecalciferol 25 MCG (1000 UT) tablet Take 1 tablet by mouth daily.     empagliflozin (JARDIANCE) 10 MG TABS tablet Take by mouth.     gabapentin (NEURONTIN) 300 MG capsule TAKE 1 CAPSULE BY MOUTH TWO TIMES A DAY     lisinopril (PRINIVIL,ZESTRIL) 5 MG tablet Take by mouth. Take 1 tab by mouth once daily     metFORMIN (GLUCOPHAGE) 500 MG tablet Take 500 mg by mouth 2 (two) times daily.     metoprolol tartrate (LOPRESSOR) 50 MG tablet Take 50 mg by mouth 2 (two) times daily.     rivaroxaban (XARELTO) 10 MG TABS tablet Take 1 tablet (10 mg total) by mouth daily. 90 tablet 1   simvastatin (ZOCOR) 40 MG tablet Take 40 mg by mouth daily.     traMADol (ULTRAM) 50 MG tablet Take 1 tablet (50 mg total) by mouth every 8 (eight) hours as needed. 10 tablet 0    vitamin B-12 (CYANOCOBALAMIN) 1000 MCG tablet Take 1,000 mcg by mouth daily.     primidone (MYSOLINE) 50 MG tablet Take 0.5 tablets by mouth 2 (two) times daily. FOR HAND TREMORS (Patient not taking: Reported on 11/02/2022)     No current facility-administered medications for this visit.     PHYSICAL EXAMINATION: ECOG PERFORMANCE STATUS: 1 - Symptomatic but completely ambulatory Vitals:   11/02/22 1322  BP: 117/75  Pulse: 75  Resp: 18  Temp: (!) 96 F (35.6 C)  SpO2: 97%   Filed Weights   11/02/22 1322  Weight: 203 lb 11.2 oz (92.4 kg)    Physical Exam Constitutional:      General: He is not in acute distress.    Appearance: He is obese.  HENT:     Head: Normocephalic and atraumatic.     Mouth/Throat:     Comments: Restricted mouth opening/trismus Eyes:     General: No scleral icterus. Cardiovascular:     Rate and Rhythm: Normal rate.  Pulmonary:     Effort: Pulmonary effort is normal. No respiratory distress.     Breath sounds: Normal breath sounds. No wheezing.  Abdominal:     General: Bowel sounds are normal. There is no distension.     Palpations: Abdomen is soft.  Musculoskeletal:        General: No swelling. Normal range of motion.     Cervical back: Normal range of motion and neck supple.  Lymphadenopathy:     Cervical: Cervical adenopathy present.  Skin:    General: Skin is warm and dry.     Findings: No erythema or rash.  Neurological:     Mental Status: He is alert and oriented to person, place, and time. Mental status is at baseline.     Cranial Nerves: No cranial nerve deficit.     Coordination: Coordination normal.  Psychiatric:        Mood and Affect: Mood normal.      LABORATORY DATA:  I have reviewed the data as listed     Latest Ref Rng & Units 11/02/2022    2:50 PM 01/21/2021    1:48 PM 08/01/2020    9:31 AM  CBC  WBC 4.0 - 10.5 K/uL 11.1  10.1  8.9   Hemoglobin 13.0 - 17.0 g/dL 15.5  15.9  15.6   Hematocrit 39.0 - 52.0 % 46.1  45.5   45.9   Platelets 150 - 400 K/uL 283  233  248       Latest Ref Rng & Units 11/02/2022    2:50 PM 01/21/2021    1:48 PM 08/01/2020    9:31 AM  CMP  Glucose 70 - 99 mg/dL 156  106  198   BUN 8 - 23 mg/dL '16  20  18   '$ Creatinine 0.61 - 1.24 mg/dL 0.95  0.89  0.91   Sodium 135 - 145 mmol/L 139  139  137   Potassium 3.5 - 5.1 mmol/L 4.2  4.2  4.0   Chloride 98 - 111 mmol/L 100  101  98   CO2 22 - 32 mmol/L '27  26  24   '$ Calcium 8.9 - 10.3 mg/dL 9.1  9.3  9.1   Total Protein 6.5 - 8.1 g/dL 7.3  7.3  7.0   Total Bilirubin 0.3 - 1.2 mg/dL 0.5  0.8  0.8   Alkaline Phos 38 - 126 U/L 53  45  42   AST 15 - 41 U/L '23  21  23   '$ ALT 0 - 44 U/L 16  20  15

## 2022-11-02 NOTE — Assessment & Plan Note (Signed)
Continue Xarelto 10 mg daily for prophylaxis. Patient is also on aspirin 81 mg daily for history of CAD status post percutaneous coronary angioplasty.

## 2022-11-02 NOTE — Assessment & Plan Note (Signed)
Discussed with patient

## 2022-11-02 NOTE — Assessment & Plan Note (Addendum)
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 If he does not have distant metastasis, recommend concurrent chemotherapy with radiation If PET scan shows distant metastasis, plan additional biopsy to confirm and patient will need systemic chemotherapy. Check CBC, CMP.  I explained to the patient the risks and benefits of chemotherapy including all but not limited to infusion reaction, hair loss, hearing loss, mouth sore, nausea, vomiting, low blood counts, bleeding, heart failure, kidney failure and risk of life threatening infection and even death, secondary malignancy etc.   Patient voices understanding and willing to proceed chemotherapy.   # Will arrange patient to have chemotherapy education; Antiemetics-Zofran and Compazine; will be sent  to pharmacy

## 2022-11-15 ENCOUNTER — Ambulatory Visit
Admission: RE | Admit: 2022-11-15 | Discharge: 2022-11-15 | Disposition: A | Discharge status: Home / Self Care | Payer: Medicare Other | Source: Ambulatory Visit | Attending: Oncology | Admitting: Oncology

## 2022-11-15 DIAGNOSIS — H9201 Otalgia, right ear: Secondary | ICD-10-CM | POA: Diagnosis not present

## 2022-11-15 DIAGNOSIS — C109 Malignant neoplasm of oropharynx, unspecified: Secondary | ICD-10-CM | POA: Insufficient documentation

## 2022-11-15 DIAGNOSIS — C029 Malignant neoplasm of tongue, unspecified: Secondary | ICD-10-CM | POA: Diagnosis not present

## 2022-11-15 DIAGNOSIS — R590 Localized enlarged lymph nodes: Secondary | ICD-10-CM | POA: Insufficient documentation

## 2022-11-15 DIAGNOSIS — E119 Type 2 diabetes mellitus without complications: Secondary | ICD-10-CM | POA: Diagnosis not present

## 2022-11-15 LAB — GLUCOSE, CAPILLARY: Glucose-Capillary: 120 mg/dL — ABNORMAL HIGH (ref 70–99)

## 2022-11-15 MED ORDER — FLUDEOXYGLUCOSE F - 18 (FDG) INJECTION
10.5000 | Freq: Once | INTRAVENOUS | Status: AC
  Administered 2022-11-15: 11.13 via INTRAVENOUS

## 2022-11-16 ENCOUNTER — Encounter: Payer: Self-pay | Admitting: Oncology

## 2022-11-16 ENCOUNTER — Other Ambulatory Visit: Payer: Self-pay | Admitting: Oncology

## 2022-11-16 DIAGNOSIS — C109 Malignant neoplasm of oropharynx, unspecified: Secondary | ICD-10-CM

## 2022-11-16 MED ORDER — PROCHLORPERAZINE MALEATE 10 MG PO TABS: 10.0000 mg | ORAL_TABLET | Freq: Four times a day (QID) | ORAL | 1 refills | Status: DC | PRN

## 2022-11-16 MED ORDER — DEXAMETHASONE 4 MG PO TABS: ORAL_TABLET | ORAL | 1 refills | Status: DC

## 2022-11-16 MED ORDER — ONDANSETRON HCL 8 MG PO TABS: 8.0000 mg | ORAL_TABLET | Freq: Three times a day (TID) | ORAL | 1 refills | Status: DC | PRN

## 2022-11-16 NOTE — Progress Notes (Signed)
START ON PATHWAY REGIMEN - Head and Neck     A cycle is every 7 days:     Cisplatin   **Always confirm dose/schedule in your pharmacy ordering system**  Patient Characteristics: Oropharynx, HPV Positive, Preoperative or Nonsurgical Candidate (Clinical Staging), cT0-4, cN1-3 or cT3-4, cN0 Disease Classification: Oropharynx HPV Status: Positive (+) Therapeutic Status: Preoperative or Nonsurgical Candidate (Clinical Staging) AJCC T Category: cT2 AJCC 8 Stage Grouping: II AJCC N Category: cN2 AJCC M Category: cM0 Intent of Therapy: Curative Intent, Discussed with Patient 

## 2022-11-17 ENCOUNTER — Telehealth: Payer: Self-pay

## 2022-11-17 DIAGNOSIS — C109 Malignant neoplasm of oropharynx, unspecified: Secondary | ICD-10-CM

## 2022-11-17 NOTE — Telephone Encounter (Signed)
Called and spoke to pt. Informed him of MD recommendation and follow up plan.   Referral has been faxed to ENT for hearing test.   Please schedule patient and call him with appts:   Chemo class -Cisplatin  Dr. Baruch Gouty - ASAP - (oropharyngeal cancer ) Tx date pending (per IS)

## 2022-11-17 NOTE — Telephone Encounter (Signed)
-----   Message from Earlie Server, MD sent at 11/16/2022 10:28 PM EDT ----- Please let patient know that PET scan showed locally advance disease, no distant spread. I recommend concurrent chemotherapy with cisplatin with RT Refer to Dr. Baruch Gouty - appt ASAP Please arrange chemotherapy class - cisplatin, hearing test. Let patient know that I have sent premed, advise him to bring med to chemo class if he has questions.  Plan is to start lab MD cisplatin when he start radiation. Chemo IS is updated, pending start date.  Thank you

## 2022-11-18 NOTE — Telephone Encounter (Signed)
Received call from ENT stating that they do not have any openings for hearing test next week, but they will look at schedule and see if they can fit him in somewhere.

## 2022-11-22 DIAGNOSIS — H903 Sensorineural hearing loss, bilateral: Secondary | ICD-10-CM | POA: Diagnosis not present

## 2022-11-23 ENCOUNTER — Encounter: Payer: Self-pay | Admitting: Oncology

## 2022-11-23 ENCOUNTER — Inpatient Hospital Stay: Payer: Medicare Other

## 2022-11-23 DIAGNOSIS — R1310 Dysphagia, unspecified: Secondary | ICD-10-CM | POA: Insufficient documentation

## 2022-11-23 DIAGNOSIS — G893 Neoplasm related pain (acute) (chronic): Secondary | ICD-10-CM | POA: Insufficient documentation

## 2022-11-23 DIAGNOSIS — Z86718 Personal history of other venous thrombosis and embolism: Secondary | ICD-10-CM | POA: Insufficient documentation

## 2022-11-23 DIAGNOSIS — C109 Malignant neoplasm of oropharynx, unspecified: Secondary | ICD-10-CM | POA: Insufficient documentation

## 2022-11-23 DIAGNOSIS — Z79899 Other long term (current) drug therapy: Secondary | ICD-10-CM | POA: Insufficient documentation

## 2022-11-23 DIAGNOSIS — Z5111 Encounter for antineoplastic chemotherapy: Secondary | ICD-10-CM | POA: Insufficient documentation

## 2022-11-23 DIAGNOSIS — Z7982 Long term (current) use of aspirin: Secondary | ICD-10-CM | POA: Insufficient documentation

## 2022-11-23 DIAGNOSIS — Z510 Encounter for antineoplastic radiation therapy: Secondary | ICD-10-CM | POA: Insufficient documentation

## 2022-11-23 DIAGNOSIS — C029 Malignant neoplasm of tongue, unspecified: Secondary | ICD-10-CM | POA: Insufficient documentation

## 2022-11-23 DIAGNOSIS — C770 Secondary and unspecified malignant neoplasm of lymph nodes of head, face and neck: Secondary | ICD-10-CM | POA: Insufficient documentation

## 2022-11-23 NOTE — Telephone Encounter (Signed)
Hearing test report scanned in media

## 2022-11-24 ENCOUNTER — Ambulatory Visit
Admission: RE | Admit: 2022-11-24 | Discharge: 2022-11-24 | Disposition: A | Discharge status: Home / Self Care | Payer: Medicare Other | Source: Ambulatory Visit | Attending: Radiation Oncology | Admitting: Radiation Oncology

## 2022-11-24 ENCOUNTER — Encounter: Payer: Self-pay | Admitting: Radiation Oncology

## 2022-11-24 VITALS — BP 117/68 | HR 64 | Temp 97.2°F | Resp 18 | Ht 70.0 in | Wt 199.7 lb

## 2022-11-24 DIAGNOSIS — G893 Neoplasm related pain (acute) (chronic): Secondary | ICD-10-CM | POA: Diagnosis not present

## 2022-11-24 DIAGNOSIS — C109 Malignant neoplasm of oropharynx, unspecified: Secondary | ICD-10-CM | POA: Diagnosis not present

## 2022-11-24 DIAGNOSIS — Z5111 Encounter for antineoplastic chemotherapy: Secondary | ICD-10-CM | POA: Diagnosis not present

## 2022-11-24 DIAGNOSIS — F17220 Nicotine dependence, chewing tobacco, uncomplicated: Secondary | ICD-10-CM | POA: Diagnosis not present

## 2022-11-24 DIAGNOSIS — Z79899 Other long term (current) drug therapy: Secondary | ICD-10-CM | POA: Diagnosis not present

## 2022-11-24 DIAGNOSIS — R1310 Dysphagia, unspecified: Secondary | ICD-10-CM | POA: Diagnosis not present

## 2022-11-24 DIAGNOSIS — C770 Secondary and unspecified malignant neoplasm of lymph nodes of head, face and neck: Secondary | ICD-10-CM | POA: Diagnosis not present

## 2022-11-24 DIAGNOSIS — Z510 Encounter for antineoplastic radiation therapy: Secondary | ICD-10-CM | POA: Diagnosis not present

## 2022-11-24 DIAGNOSIS — Z86718 Personal history of other venous thrombosis and embolism: Secondary | ICD-10-CM | POA: Diagnosis not present

## 2022-11-24 DIAGNOSIS — C029 Malignant neoplasm of tongue, unspecified: Secondary | ICD-10-CM | POA: Diagnosis not present

## 2022-11-24 DIAGNOSIS — Z7982 Long term (current) use of aspirin: Secondary | ICD-10-CM | POA: Diagnosis not present

## 2022-11-24 NOTE — Consult Note (Signed)
NEW PATIENT EVALUATION  Name: Kenneth Gilmore  MRN: [id number]  Date:   11/24/2022     DOB: Apr 13, 1951   This 72 y.o. 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  REFERRING PHYSICIAN: Earlie Server, MD  CHIEF COMPLAINT:  Chief Complaint  Patient presents with   Consult    DIAGNOSIS: The encounter diagnosis was Oropharyngeal cancer.   PREVIOUS INVESTIGATIONS:  PET CT and CT scans reviewed Pathology report reviewed Clinical notes reviewed  HPI: Patient is a 72 year old male with a history of increasing trismus and some oropharyngeal pain.  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  The primary lesion measured at least 4 cm and infiltrates the parapharyngeal fat.  PET scan confirmed hypermetabolic large infiltrating right oropharyngeal mass consistent with known malignancy.  He also had bilateral hypermetabolic metastatic cervical lymphadenopathy.  No evidence of distant metastatic disease in chest abdomen or pelvis.  Lymph node biopsy of her right cervical node was positive for squamous cell carcinoma keratinizing.He has been seen by medical oncology with recommendation for concurrent chemoradiation therapy he is seen today for ration collagen opinion.  He is having some slight dysphagia and no significant head and neck pain.  He does have some ear pain in his right ear.  PLANNED TREATMENT REGIMEN: Concurrent chemoradiation  PAST MEDICAL HISTORY:  has a past medical history of Diabetes mellitus without complication, Hyperlipidemia, and Hypertension.    PAST SURGICAL HISTORY:  Past Surgical History:  Procedure Laterality Date   arm surgery Left    CORONARY ANGIOPLASTY WITH STENT PLACEMENT     left side rotator cuff surgery     SHOULDER SURGERY      FAMILY HISTORY: family history  includes Cancer in his father.  SOCIAL HISTORY:  reports that he has never smoked. His smokeless tobacco use includes chew. He reports that he does not drink alcohol and does not use drugs.  ALLERGIES: Patient has no known allergies.  MEDICATIONS:  Current Outpatient Medications  Medication Sig Dispense Refill   atorvastatin (LIPITOR) 80 MG tablet TAKE ONE-HALF TABLET BY MOUTH AT BEDTIME FOR HIGH CHOLESTEROL REPLACES SIMVASTATIN     aspirin 81 MG tablet Take 81 mg by mouth daily.     Cholecalciferol 25 MCG (1000 UT) tablet Take 1 tablet by mouth daily.     dexamethasone (DECADRON) 4 MG tablet Take 2 tablets (8 mg) by mouth daily x 2 days starting the day after cisplatin chemotherapy. Take with food. 30 tablet 1   empagliflozin (JARDIANCE) 10 MG TABS tablet Take by mouth.     gabapentin (NEURONTIN) 300 MG capsule TAKE 1 CAPSULE BY MOUTH TWO TIMES A DAY     lisinopril (PRINIVIL,ZESTRIL) 5 MG tablet Take by mouth. Take 1 tab by mouth once daily     metFORMIN (GLUCOPHAGE) 500 MG tablet Take 500 mg by mouth 2 (two) times daily.     metoprolol tartrate (LOPRESSOR) 50 MG tablet Take 50 mg by mouth 2 (two) times daily.     ondansetron (ZOFRAN) 8 MG tablet Take 1 tablet (8 mg total) by mouth every 8 (eight) hours as needed for nausea or vomiting. Start on the third day after cisplatin. 30 tablet 1   primidone (MYSOLINE) 50 MG tablet Take 0.5 tablets by mouth 2 (two) times daily. FOR HAND TREMORS (Patient not taking: Reported  on 11/02/2022)     prochlorperazine (COMPAZINE) 10 MG tablet Take 1 tablet (10 mg total) by mouth every 6 (six) hours as needed (Nausea or vomiting). 30 tablet 1   rivaroxaban (XARELTO) 10 MG TABS tablet Take 1 tablet (10 mg total) by mouth daily. 90 tablet 1   simvastatin (ZOCOR) 40 MG tablet Take 40 mg by mouth daily.     traMADol (ULTRAM) 50 MG tablet Take 1 tablet (50 mg total) by mouth every 8 (eight) hours as needed. 10 tablet 0   vitamin B-12 (CYANOCOBALAMIN) 1000 MCG tablet  Take 1,000 mcg by mouth daily.     No current facility-administered medications for this encounter.    ECOG PERFORMANCE STATUS:  1 - Symptomatic but completely ambulatory  REVIEW OF SYSTEMS: Patient does have a history of DVT. Patient denies any weight loss, fatigue, weakness, fever, chills or night sweats. Patient denies any loss of vision, blurred vision. Patient denies any ringing  of the ears or hearing loss. No irregular heartbeat. Patient denies heart murmur or history of fainting. Patient denies any chest pain or pain radiating to her upper extremities. Patient denies any shortness of breath, difficulty breathing at night, cough or hemoptysis. Patient denies any swelling in the lower legs. Patient denies any nausea vomiting, vomiting of blood, or coffee ground material in the vomitus. Patient denies any stomach pain. Patient states has had normal bowel movements no significant constipation or diarrhea. Patient denies any dysuria, hematuria or significant nocturia. Patient denies any problems walking, swelling in the joints or loss of balance. Patient denies any skin changes, loss of hair or loss of weight. Patient denies any excessive worrying or anxiety or significant depression. Patient denies any problems with insomnia. Patient denies excessive thirst, polyuria, polydipsia. Patient denies any swollen glands, patient denies easy bruising or easy bleeding. Patient denies any recent infections, allergies or URI. Patient "s visual fields have not changed significantly in recent time.   PHYSICAL EXAM: BP 117/68   Pulse 64   Temp (!) 97.2 F (36.2 C)   Resp 18   Ht 5\' 10"  (1.778 m)   Wt 199 lb 11.2 oz (90.6 kg)   BMI 28.65 kg/m  Patient has trismus making oral examination difficult.  He does have significant adenopathy in the right neck in the high cervical chain.  No palpable adenopathy in the left neck.  No supraclavicular adenopathy.  Well-developed well-nourished patient in NAD. HEENT  reveals PERLA, EOMI, discs not visualized.  Oral cavity is clear. No oral mucosal lesions are identified. Neck is clear without evidence of cervical or supraclavicular adenopathy. Lungs are clear to A&P. Cardiac examination is essentially unremarkable with regular rate and rhythm without murmur rub or thrill. Abdomen is benign with no organomegaly or masses noted. Motor sensory and DTR levels are equal and symmetric in the upper and lower extremities. Cranial nerves II through XII are grossly intact. Proprioception is intact. No peripheral adenopathy or edema is identified. No motor or sensory levels are noted. Crude visual fields are within normal range.  LABORATORY DATA: Pathology report reviewed    RADIOLOGY RESULTS: CT scans and PET CT scans reviewed compatible with above-stated findings   IMPRESSION: Stage IVa squamous cell carcinoma the oropharynx in 72 year old male  PLAN: At this time I recommend concurrent chemoradiation therapy.  Would plan on delivering 70 Gray over 7 weeks to the primary lesion plus hypermetabolic bilateral neck nodes.  Will treat remainder of his bilateral nodes to 54 Gray using IMRT dose  painting technique.  Risks and benefits of treatment including possible xerostomia oral mucositis skin reaction loss of taste fatigue alteration of blood counts all were described in detail to the patient.  I have personally set up and ordered CT simulation for early next week and we will coordinate his chemotherapy.  There will be extra effort by both professional staff as well as technical staff to coordinate and manage concurrent chemoradiation and ensuing side effects during his treatments. Patient comprehends my recommendations well.  I would like to take this opportunity to thank you for allowing me to participate in the care of your patient.Noreene Filbert, MD

## 2022-11-25 ENCOUNTER — Other Ambulatory Visit: Payer: Self-pay | Admitting: Oncology

## 2022-11-25 ENCOUNTER — Other Ambulatory Visit: Payer: Self-pay

## 2022-11-25 DIAGNOSIS — C109 Malignant neoplasm of oropharynx, unspecified: Secondary | ICD-10-CM

## 2022-11-26 ENCOUNTER — Other Ambulatory Visit: Payer: Self-pay

## 2022-11-30 ENCOUNTER — Ambulatory Visit
Admission: RE | Admit: 2022-11-30 | Discharge: 2022-11-30 | Disposition: A | Discharge status: Home / Self Care | Payer: Medicare Other | Source: Ambulatory Visit | Attending: Radiation Oncology | Admitting: Radiation Oncology

## 2022-11-30 DIAGNOSIS — R1310 Dysphagia, unspecified: Secondary | ICD-10-CM | POA: Diagnosis not present

## 2022-11-30 DIAGNOSIS — Z5111 Encounter for antineoplastic chemotherapy: Secondary | ICD-10-CM | POA: Diagnosis not present

## 2022-11-30 DIAGNOSIS — F17220 Nicotine dependence, chewing tobacco, uncomplicated: Secondary | ICD-10-CM | POA: Diagnosis not present

## 2022-11-30 DIAGNOSIS — Z79899 Other long term (current) drug therapy: Secondary | ICD-10-CM | POA: Diagnosis not present

## 2022-11-30 DIAGNOSIS — C109 Malignant neoplasm of oropharynx, unspecified: Secondary | ICD-10-CM | POA: Diagnosis not present

## 2022-11-30 DIAGNOSIS — C029 Malignant neoplasm of tongue, unspecified: Secondary | ICD-10-CM | POA: Diagnosis not present

## 2022-11-30 DIAGNOSIS — Z7982 Long term (current) use of aspirin: Secondary | ICD-10-CM | POA: Diagnosis not present

## 2022-11-30 DIAGNOSIS — Z86718 Personal history of other venous thrombosis and embolism: Secondary | ICD-10-CM | POA: Diagnosis not present

## 2022-11-30 DIAGNOSIS — Z510 Encounter for antineoplastic radiation therapy: Secondary | ICD-10-CM | POA: Diagnosis not present

## 2022-11-30 DIAGNOSIS — C770 Secondary and unspecified malignant neoplasm of lymph nodes of head, face and neck: Secondary | ICD-10-CM | POA: Diagnosis not present

## 2022-11-30 DIAGNOSIS — G893 Neoplasm related pain (acute) (chronic): Secondary | ICD-10-CM | POA: Diagnosis not present

## 2022-12-01 ENCOUNTER — Other Ambulatory Visit: Payer: Self-pay

## 2022-12-07 DIAGNOSIS — F17220 Nicotine dependence, chewing tobacco, uncomplicated: Secondary | ICD-10-CM | POA: Diagnosis not present

## 2022-12-07 DIAGNOSIS — C770 Secondary and unspecified malignant neoplasm of lymph nodes of head, face and neck: Secondary | ICD-10-CM | POA: Diagnosis not present

## 2022-12-07 DIAGNOSIS — Z7982 Long term (current) use of aspirin: Secondary | ICD-10-CM | POA: Diagnosis not present

## 2022-12-07 DIAGNOSIS — R1310 Dysphagia, unspecified: Secondary | ICD-10-CM | POA: Diagnosis not present

## 2022-12-07 DIAGNOSIS — Z79899 Other long term (current) drug therapy: Secondary | ICD-10-CM | POA: Diagnosis not present

## 2022-12-07 DIAGNOSIS — C029 Malignant neoplasm of tongue, unspecified: Secondary | ICD-10-CM | POA: Diagnosis not present

## 2022-12-07 DIAGNOSIS — G893 Neoplasm related pain (acute) (chronic): Secondary | ICD-10-CM | POA: Diagnosis not present

## 2022-12-07 DIAGNOSIS — C109 Malignant neoplasm of oropharynx, unspecified: Secondary | ICD-10-CM | POA: Diagnosis not present

## 2022-12-07 DIAGNOSIS — Z510 Encounter for antineoplastic radiation therapy: Secondary | ICD-10-CM | POA: Diagnosis not present

## 2022-12-07 DIAGNOSIS — Z86718 Personal history of other venous thrombosis and embolism: Secondary | ICD-10-CM | POA: Diagnosis not present

## 2022-12-07 DIAGNOSIS — Z5111 Encounter for antineoplastic chemotherapy: Secondary | ICD-10-CM | POA: Diagnosis not present

## 2022-12-08 ENCOUNTER — Ambulatory Visit: Admission: RE | Admit: 2022-12-08 | Payer: Medicare Other | Source: Ambulatory Visit

## 2022-12-08 MED FILL — Fosaprepitant Dimeglumine For IV Infusion 150 MG (Base Eq): INTRAVENOUS | Qty: 5 | Status: AC

## 2022-12-08 MED FILL — Dexamethasone Sodium Phosphate Inj 100 MG/10ML: INTRAMUSCULAR | Qty: 1 | Status: AC

## 2022-12-09 ENCOUNTER — Inpatient Hospital Stay (HOSPITAL_BASED_OUTPATIENT_CLINIC_OR_DEPARTMENT_OTHER): Payer: Medicare Other | Admitting: Oncology

## 2022-12-09 ENCOUNTER — Other Ambulatory Visit: Payer: Self-pay

## 2022-12-09 ENCOUNTER — Inpatient Hospital Stay: Payer: Medicare Other

## 2022-12-09 ENCOUNTER — Encounter: Payer: Self-pay | Admitting: Oncology

## 2022-12-09 ENCOUNTER — Ambulatory Visit
Admission: RE | Admit: 2022-12-09 | Discharge: 2022-12-09 | Disposition: A | Discharge status: Home / Self Care | Payer: Medicare Other | Source: Ambulatory Visit | Attending: Radiation Oncology | Admitting: Radiation Oncology

## 2022-12-09 VITALS — BP 136/77 | HR 66 | Temp 96.0°F | Wt 194.5 lb

## 2022-12-09 DIAGNOSIS — Z79899 Other long term (current) drug therapy: Secondary | ICD-10-CM | POA: Diagnosis not present

## 2022-12-09 DIAGNOSIS — G893 Neoplasm related pain (acute) (chronic): Secondary | ICD-10-CM | POA: Diagnosis not present

## 2022-12-09 DIAGNOSIS — R1310 Dysphagia, unspecified: Secondary | ICD-10-CM | POA: Diagnosis not present

## 2022-12-09 DIAGNOSIS — C109 Malignant neoplasm of oropharynx, unspecified: Secondary | ICD-10-CM

## 2022-12-09 DIAGNOSIS — C770 Secondary and unspecified malignant neoplasm of lymph nodes of head, face and neck: Secondary | ICD-10-CM | POA: Diagnosis not present

## 2022-12-09 DIAGNOSIS — Z510 Encounter for antineoplastic radiation therapy: Secondary | ICD-10-CM | POA: Diagnosis not present

## 2022-12-09 DIAGNOSIS — Z86718 Personal history of other venous thrombosis and embolism: Secondary | ICD-10-CM

## 2022-12-09 DIAGNOSIS — Z5111 Encounter for antineoplastic chemotherapy: Secondary | ICD-10-CM | POA: Diagnosis not present

## 2022-12-09 DIAGNOSIS — C029 Malignant neoplasm of tongue, unspecified: Secondary | ICD-10-CM | POA: Diagnosis not present

## 2022-12-09 DIAGNOSIS — Z7982 Long term (current) use of aspirin: Secondary | ICD-10-CM | POA: Diagnosis not present

## 2022-12-09 DIAGNOSIS — F17220 Nicotine dependence, chewing tobacco, uncomplicated: Secondary | ICD-10-CM | POA: Diagnosis not present

## 2022-12-09 LAB — CBC WITH DIFFERENTIAL (CANCER CENTER ONLY)
Abs Immature Granulocytes: 0.04 10*3/uL (ref 0.00–0.07)
Basophils Absolute: 0.1 10*3/uL (ref 0.0–0.1)
Basophils Relative: 1 %
Eosinophils Absolute: 0.4 10*3/uL (ref 0.0–0.5)
Eosinophils Relative: 4 %
HCT: 45.2 % (ref 39.0–52.0)
Hemoglobin: 15.5 g/dL (ref 13.0–17.0)
Immature Granulocytes: 0 %
Lymphocytes Relative: 35 %
Lymphs Abs: 3.4 10*3/uL (ref 0.7–4.0)
MCH: 32 pg (ref 26.0–34.0)
MCHC: 34.3 g/dL (ref 30.0–36.0)
MCV: 93.2 fL (ref 80.0–100.0)
Monocytes Absolute: 0.9 10*3/uL (ref 0.1–1.0)
Monocytes Relative: 9 %
Neutro Abs: 4.9 10*3/uL (ref 1.7–7.7)
Neutrophils Relative %: 51 %
Platelet Count: 253 10*3/uL (ref 150–400)
RBC: 4.85 MIL/uL (ref 4.22–5.81)
RDW: 12.1 % (ref 11.5–15.5)
WBC Count: 9.7 10*3/uL (ref 4.0–10.5)
nRBC: 0 % (ref 0.0–0.2)

## 2022-12-09 LAB — CMP (CANCER CENTER ONLY)
ALT: 17 U/L (ref 0–44)
AST: 20 U/L (ref 15–41)
Albumin: 4.3 g/dL (ref 3.5–5.0)
Alkaline Phosphatase: 59 U/L (ref 38–126)
Anion gap: 9 (ref 5–15)
BUN: 15 mg/dL (ref 8–23)
CO2: 29 mmol/L (ref 22–32)
Calcium: 9.4 mg/dL (ref 8.9–10.3)
Chloride: 101 mmol/L (ref 98–111)
Creatinine: 0.88 mg/dL (ref 0.61–1.24)
GFR, Estimated: >60 mL/min (ref >60)
Glucose, Bld: 138 mg/dL — ABNORMAL HIGH (ref 70–99)
Potassium: 4 mmol/L (ref 3.5–5.1)
Sodium: 139 mmol/L (ref 135–145)
Total Bilirubin: 0.7 mg/dL (ref 0.3–1.2)
Total Protein: 7.2 g/dL (ref 6.5–8.1)

## 2022-12-09 LAB — RAD ONC ARIA SESSION SUMMARY
Course Elapsed Days: 0
Plan Fractions Treated to Date: 1
Plan Prescribed Dose Per Fraction: 2 Gy
Plan Total Fractions Prescribed: 35
Plan Total Prescribed Dose: 70 Gy
Reference Point Dosage Given to Date: 2 Gy
Reference Point Session Dosage Given: 2 Gy
Session Number: 1

## 2022-12-09 LAB — MAGNESIUM: Magnesium: 2 mg/dL (ref 1.7–2.4)

## 2022-12-09 MED ORDER — PALONOSETRON HCL INJECTION 0.25 MG/5ML
0.2500 mg | Freq: Once | INTRAVENOUS | Status: AC
  Administered 2022-12-09: 0.25 mg via INTRAVENOUS
  Filled 2022-12-09: qty 5

## 2022-12-09 MED ORDER — SODIUM CHLORIDE 0.9 % IV SOLN
Freq: Once | INTRAVENOUS | Status: AC
  Filled 2022-12-09: qty 250

## 2022-12-09 MED ORDER — SODIUM CHLORIDE 0.9 % IV SOLN
150.0000 mg | Freq: Once | INTRAVENOUS | Status: AC
  Administered 2022-12-09: 150 mg via INTRAVENOUS
  Filled 2022-12-09: qty 150

## 2022-12-09 MED ORDER — POTASSIUM CHLORIDE IN NACL 20-0.9 MEQ/L-% IV SOLN
Freq: Once | INTRAVENOUS | Status: AC
  Filled 2022-12-09: qty 1000

## 2022-12-09 MED ORDER — SODIUM CHLORIDE 0.9 % IV SOLN
10.0000 mg | Freq: Once | INTRAVENOUS | Status: AC
  Administered 2022-12-09: 10 mg via INTRAVENOUS
  Filled 2022-12-09: qty 10

## 2022-12-09 MED ORDER — SODIUM CHLORIDE 0.9 % IV SOLN
40.0000 mg/m2 | Freq: Once | INTRAVENOUS | Status: AC
  Administered 2022-12-09: 84 mg via INTRAVENOUS
  Filled 2022-12-09: qty 84

## 2022-12-09 MED ORDER — OXYCODONE HCL 5 MG PO TABS: 5.0000 mg | ORAL_TABLET | Freq: Four times a day (QID) | ORAL | 0 refills | Status: DC | PRN

## 2022-12-09 MED ORDER — MAGNESIUM SULFATE 2 GM/50ML IV SOLN
2.0000 g | Freq: Once | INTRAVENOUS | Status: AC
  Administered 2022-12-09: 2 g via INTRAVENOUS
  Filled 2022-12-09: qty 50

## 2022-12-09 NOTE — Patient Instructions (Addendum)
[facility]  Discharge Instructions: Thank you for choosing [facility] to provide your oncology and hematology care.  If you have a lab appointment with the Cancer Center, please go directly to the Cancer Center and check in at the registration area.  Wear comfortable clothing and clothing appropriate for easy access to any Portacath or PICC line.   We strive to give you quality time with your provider. You may need to reschedule your appointment if you arrive late (15 or more minutes).  Arriving late affects you and other patients whose appointments are after yours.  Also, if you miss three or more appointments without notifying the office, you may be dismissed from the clinic at the provider's discretion.      For prescription refill requests, have your pharmacy contact our office and allow 72 hours for refills to be completed.    Today you received the following chemotherapy and/or immunotherapy agents- Cisplatin      To help prevent nausea and vomiting after your treatment, we encourage you to take your nausea medication as directed.  BELOW ARE SYMPTOMS THAT SHOULD BE REPORTED IMMEDIATELY: *FEVER GREATER THAN 100.4 F (38 C) OR HIGHER *CHILLS OR SWEATING *NAUSEA AND VOMITING THAT IS NOT CONTROLLED WITH YOUR NAUSEA MEDICATION *UNUSUAL SHORTNESS OF BREATH *UNUSUAL BRUISING OR BLEEDING *URINARY PROBLEMS (pain or burning when urinating, or frequent urination) *BOWEL PROBLEMS (unusual diarrhea, constipation, pain near the anus) TENDERNESS IN MOUTH AND THROAT WITH OR WITHOUT PRESENCE OF ULCERS (sore throat, sores in mouth, or a toothache) UNUSUAL RASH, SWELLING OR PAIN  UNUSUAL VAGINAL DISCHARGE OR ITCHING   Items with * indicate a potential emergency and should be followed up as soon as possible or go to the Emergency Department if any problems should occur.  Please show the CHEMOTHERAPY ALERT CARD or IMMUNOTHERAPY ALERT CARD at check-in to  the Emergency Department and triage nurse.  Should you have questions after your visit or need to cancel or reschedule your appointment, please contact [facility]  [phone] and follow the prompts.  Office hours are 8:00 a.m. to 4:30 p.m. Monday - Friday. Please note that voicemails left after 4:00 p.m. may not be returned until the following business day.  We are closed weekends and major holidays. You have access to a nurse at all times for urgent questions. Please call the main number to the clinic [phone] and follow the prompts.  For any non-urgent questions, you may also contact your provider using MyChart. We now offer e-Visits for anyone 18 and older to request care online for non-urgent symptoms. For details visit [email or web address].   Also download the MyChart app! Go to the app store, search "MyChart", open the app, select [facility], and log in with your MyChart username and password.  Cisplatin Injection What is this medication? CISPLATIN (SIS pla tin) treats some types of cancer. It works by slowing down the growth of cancer cells. This medicine may be used for other purposes; ask your health care provider or pharmacist if you have questions. COMMON BRAND NAME(S): Platinol, Platinol -AQ What should I tell my care team before I take this medication? They need to know if you have any of these conditions: Eye disease, vision problems Hearing problems Kidney disease Low blood counts, such as low white cells, platelets, or red blood cells Tingling of the fingers or toes, or other nerve disorder An unusual or allergic reaction to cisplatin, carboplatin, oxaliplatin, other   medications, foods, dyes, or preservatives If you or your partner are pregnant or trying to get pregnant Breast-feeding How should I use this medication? This medication is injected into a vein. It is given by your care team in a hospital or clinic setting. Talk to  your care team about the use of this medication in children. Special care may be needed. Overdosage: If you think you have taken too much of this medicine contact a poison control center or emergency room at once. NOTE: This medicine is only for you. Do not share this medicine with others. What if I miss a dose? Keep appointments for follow-up doses. It is important not to miss your dose. Call your care team if you are unable to keep an appointment. What may interact with this medication? Do not take this medication with any of the following: Live virus vaccines This medication may also interact with the following: Certain antibiotics, such as amikacin, gentamicin, neomycin, polymyxin B, streptomycin, tobramycin, vancomycin Foscarnet This list may not describe all possible interactions. Give your health care provider a list of all the medicines, herbs, non-prescription drugs, or dietary supplements you use. Also tell them if you smoke, drink alcohol, or use illegal drugs. Some items may interact with your medicine. What should I watch for while using this medication? Your condition will be monitored carefully while you are receiving this medication. You may need blood work done while taking this medication. This medication may make you feel generally unwell. This is not uncommon, as chemotherapy can affect healthy cells as well as cancer cells. Report any side effects. Continue your course of treatment even though you feel ill unless your care team tells you to stop. This medication may increase your risk of getting an infection. Call your care team for advice if you get a fever, chills, sore throat, or other symptoms of a cold or flu. Do not treat yourself. Try to avoid being around people who are sick. Avoid taking medications that contain aspirin, acetaminophen, ibuprofen, naproxen, or ketoprofen unless instructed by your care team. These medications may hide a fever. This medication may increase  your risk to bruise or bleed. Call your care team if you notice any unusual bleeding. Be careful brushing or flossing your teeth or using a toothpick because you may get an infection or bleed more easily. If you have any dental work done, tell your dentist you are receiving this medication. Drink fluids as directed while you are taking this medication. This will help protect your kidneys. Call your care team if you get diarrhea. Do not treat yourself. Talk to your care team if you or your partner wish to become pregnant or think you might be pregnant. This medication can cause serious birth defects if taken during pregnancy and for 14 months after the last dose. A negative pregnancy test is required before starting this medication. A reliable form of contraception is recommended while taking this medication and for 14 months after the last dose. Talk to your care team about effective forms of contraception. Do not father a child while taking this medication and for 11 months after the last dose. Use a condom during sex during this time period. Do not breast-feed while taking this medication. This medication may cause infertility. Talk to your care team if you are concerned about your fertility. What side effects may I notice from receiving this medication? Side effects that you should report to your care team as soon as possible: Allergic reactions--skin rash,   itching, hives, swelling of the face, lips, tongue, or throat Eye pain, change in vision, vision loss Hearing loss, ringing in ears Infection--fever, chills, cough, sore throat, wounds that don't heal, pain or trouble when passing urine, general feeling of discomfort or being unwell Kidney injury--decrease in the amount of urine, swelling of the ankles, hands, or feet Low red blood cell level--unusual weakness or fatigue, dizziness, headache, trouble breathing Painful swelling, warmth, or redness of the skin, blisters or sores at the infusion  site Pain, tingling, or numbness in the hands or feet Unusual bruising or bleeding Side effects that usually do not require medical attention (report to your care team if they continue or are bothersome): Hair loss Nausea Vomiting This list may not describe all possible side effects. Call your doctor for medical advice about side effects. You may report side effects to FDA at 1-800-FDA-1088. Where should I keep my medication? This medication is given in a hospital or clinic. It will not be stored at home. NOTE: This sheet is a summary. It may not cover all possible information. If you have questions about this medicine, talk to your doctor, pharmacist, or health care provider.  2023 Elsevier/Gold Standard (2021-12-04 00:00:00)    

## 2022-12-09 NOTE — Assessment & Plan Note (Signed)
Continue Xarelto 10 mg daily for prophylaxis. Patient is also on aspirin 81 mg daily for history of CAD status post percutaneous coronary angioplasty. 

## 2022-12-09 NOTE — Progress Notes (Signed)
Hematology/Oncology Progress note [phone] [phone]      REASON FOR VISIT Follow up for oropharyngeal squamous cell carcinoma  ASSESSMENT & PLAN:   Cancer Staging  Oropharyngeal cancer Staging form: Oral Cavity, AJCC 8th Edition - Clinical: Stage IVA (cT3, cN2a, cM0) - Signed by Kenneth Patience, MD on 11/02/2022   Oropharyngeal cancer (HCC) stage IVA oropharyngeal squamous cell carcinoma, Recommend concurrent chemotherapy with Radiation.  Rationale and side effects were reviewed with patient and he agrees with the plan. Baseline audio testing done.  Labs are reviewed and discussed with patient. Proceed with cisplatin /m2 today  Encounter for antineoplastic chemotherapy Chemotherapy plan as listed above  History of DVT (deep vein thrombosis) Continue Xarelto 10 mg daily for prophylaxis. Patient is also on aspirin 81 mg daily for history of CAD status post percutaneous coronary angioplasty.  Neoplasm related pain Not well controlled with Tramadol.  I recommend patient to switch to oxycodone  Q6h PRN pain.  Rationale and side effects were reviewed with patient.   No orders of the defined types were placed in this encounter.  Follow-up 1 week lab MD cisplatin.  All questions were answered. The patient knows to call the clinic with any problems, questions or concerns.  Kenneth Patience, MD, PhD [facility] Hematology Oncology 12/09/2022   HISTORY OF PRESENTING ILLNESS:  Patient presented outpatient ultrasound of his left lower extremity showing positive for DVT. Denies any recent,, surgery, travel or issue.  Denies any family history of DVT.  Reports that he developed left lower extremity swelling for a week, associated with moderate severity throbbing aching discomfort feels like pulling a muscle.  Denies any shortness of breath. Patient was started on Xarelto starting kit and advised to follow-up with heme-onc.  Today patient reports being compliant  with taking Xarelto.  Currently he take Xarelto  daily. Also on Aspirin  daily. Tolerates well.Denies hematochezia, hematuria, hematemesis, epistaxis, black tarry stool or easy bruising.   INTERVAL HISTORY Kenneth Gilmore is a 72 y.o. male presents to re-establish care for oropharyngeal squamous cell carcinoma  Oncology History  Oropharyngeal cancer  09/28/2022 Imaging   CT soft tissue neck with contrast showed infiltrating mass in the right tongue with multiple ipsilateral nodal metastases. The primary mass measures at least 4 cm and infiltrates parapharyngeal fat, visualization of tumor extent is limited by artifact from dental amalgam.   10/28/2022 Initial Diagnosis   Squamous cell carcinoma of tongue (HCC) Patient has throat pain and was evaluated by ENT Dr.Bennett.  Physical examination showed cervical lymphadenopathy.  10/15/2022 ultrasound-guided biopsy of right cervical lymph node showed fibrous tissue, detach minute salivary gland tissue.  Granular debris's and a few dysplastic squamous epithelial cells, nondiagnostic.  10/22/2022 repeat ultrasound-guided biopsy of right cervical lymph node was positive for malignancy, metastatic squamous cell carcinoma, keratinizing, in a background of dense fibrosis and tumor necrosis.   10/28/2022 Cancer Staging   Staging form: Oral Cavity, AJCC 8th Edition - Clinical: Stage IVA (cT3, cN2a, cM0) - Signed by Kenneth Patience, MD on 11/02/2022   11/16/2022 Imaging   PET scan showed 1. Large infiltrating right oropharyngeal mass is markedly hypermetabolic and consistent with known squamous cell carcinoma. 2. Bilateral hypermetabolic metastatic cervical lymphadenopathy. 3. No evidence of metastatic disease involving the chest, abdomen/pelvis or bony structures.     12/09/2022 -  Chemotherapy   Patient is on Treatment Plan : HEAD/NECK Cisplatin (40) q7d     He uses dipping tobacco and tries to quit. Denies any swallowing difficulties,  shortness of  breath, cough, hemoptysis, unintentional weight loss bone pain..   Patient has sore throat as well as right ear pain, right ear sharp pain is intermittent, sometimes wakes him up during sleeping.  He took Tramadol which only helped a little bit.    Review of Systems  Constitutional:  Negative for chills, fever, malaise/fatigue and weight loss.  HENT:  Negative for sore throat.        Sore throat, right ear pain  Eyes:  Negative for redness.  Respiratory:  Negative for cough, shortness of breath and wheezing.   Cardiovascular:  Negative for chest pain and palpitations.       Trace edema bilaterally  Gastrointestinal:  Negative for abdominal pain, blood in stool, nausea and vomiting.  Genitourinary:  Negative for dysuria.  Musculoskeletal:  Negative for myalgias.  Skin:  Negative for rash.  Neurological:  Negative for dizziness, tingling and tremors.  Endo/Heme/Allergies:  Does not bruise/bleed easily.  Psychiatric/Behavioral:  Negative for hallucinations.     MEDICAL HISTORY:  Past Medical History:  Diagnosis Date   Diabetes mellitus without complication    Hyperlipidemia    Hypertension     SURGICAL HISTORY: Past Surgical History:  Procedure Laterality Date   arm surgery Left    CORONARY ANGIOPLASTY WITH STENT PLACEMENT     left side rotator cuff surgery     SHOULDER SURGERY      SOCIAL HISTORY: Social History   Socioeconomic History   Marital status: Single    Spouse name: Not on file   Number of children: Not on file   Years of education: Not on file   Highest education level: Not on file  Occupational History   Occupation: retired  Tobacco Use   Smoking status: Never   Smokeless tobacco: Current    Types: Chew  Substance and Sexual Activity   Alcohol use: No   Drug use: Never   Sexual activity: Not on file  Other Topics Concern   Not on file  Social History Narrative   Not on file   Social Determinants of Health   Financial Resource Strain: Not on  file  Food Insecurity: Not on file  Transportation Needs: Not on file  Physical Activity: Not on file  Stress: Not on file  Social Connections: Not on file  Intimate Partner Violence: Not on file    FAMILY HISTORY: Family History  Problem Relation Age of Onset   Cancer Father        lung    ALLERGIES:  has No Known Allergies.  MEDICATIONS:  Current Outpatient Medications  Medication Sig Dispense Refill   aspirin 81 MG tablet Take 81 mg by mouth daily.     atorvastatin (LIPITOR) 80 MG tablet TAKE ONE-HALF TABLET BY MOUTH AT BEDTIME FOR HIGH CHOLESTEROL REPLACES SIMVASTATIN     Cholecalciferol 25 MCG (1000 UT) tablet Take 1 tablet by mouth daily.     dexamethasone (DECADRON) 4 MG tablet Take 2 tablets (8 mg) by mouth daily x 2 days starting the day after cisplatin chemotherapy. Take with food. 30 tablet 1   empagliflozin (JARDIANCE) 10 MG TABS tablet Take by mouth.     gabapentin (NEURONTIN) 300 MG capsule TAKE 1 CAPSULE BY MOUTH TWO TIMES A DAY     lisinopril (PRINIVIL,ZESTRIL) 5 MG tablet Take by mouth. Take 1 tab by mouth once daily     metFORMIN (GLUCOPHAGE) 500 MG tablet Take 500 mg by mouth 2 (two) times daily.  metoprolol tartrate (LOPRESSOR) 50 MG tablet Take 50 mg by mouth 2 (two) times daily.     ondansetron (ZOFRAN) 8 MG tablet Take 1 tablet (8 mg total) by mouth every 8 (eight) hours as needed for nausea or vomiting. Start on the third day after cisplatin. 30 tablet 1   oxyCODONE (OXY IR/ROXICODONE) 5 MG immediate release tablet Take 1 tablet (5 mg total) by mouth every 6 (six) hours as needed for severe pain. 30 tablet 0   prochlorperazine (COMPAZINE) 10 MG tablet Take 1 tablet (10 mg total) by mouth every 6 (six) hours as needed (Nausea or vomiting). 30 tablet 1   rivaroxaban (XARELTO) 10 MG TABS tablet Take 1 tablet (10 mg total) by mouth daily. 90 tablet 1   simvastatin (ZOCOR) 40 MG tablet Take 40 mg by mouth daily.     vitamin B-12 (CYANOCOBALAMIN) 1000 MCG  tablet Take 1,000 mcg by mouth daily.     primidone (MYSOLINE) 50 MG tablet Take 0.5 tablets by mouth 2 (two) times daily. FOR HAND TREMORS (Patient not taking: Reported on 11/02/2022)     No current facility-administered medications for this visit.   Facility-Administered Medications Ordered in Other Visits  Medication Dose Route Frequency Provider Last Rate Last Admin   0.9 %  sodium chloride infusion   Intravenous Once Kenneth Patience, MD       0.9 % NaCl with KCl 20 mEq/ L  infusion   Intravenous Once Kenneth Patience, MD       CISplatin (PLATINOL) 84 mg in sodium chloride 0.9 % 250 mL chemo infusion  40 mg/m2 (Order-Specific) Intravenous Once Kenneth Patience, MD       dexamethasone (DECADRON) 10 mg in sodium chloride 0.9 % 50 mL IVPB  10 mg Intravenous Once Kenneth Patience, MD       fosaprepitant (EMEND) 150 mg in sodium chloride 0.9 % 145 mL IVPB  150 mg Intravenous Once Kenneth Patience, MD       magnesium sulfate IVPB 2 g 50 mL  2 g Intravenous Once Kenneth Patience, MD       palonosetron (ALOXI) injection 0.25 mg  0.25 mg Intravenous Once Kenneth Patience, MD         PHYSICAL EXAMINATION: ECOG PERFORMANCE STATUS: 1 - Symptomatic but completely ambulatory Vitals:   12/09/22 0832  BP: 136/77  Pulse: 66  Temp: (!) 96 F (35.6 C)  SpO2: 98%   Filed Weights   12/09/22 0832  Weight: 194 lb 8 oz (88.2 kg)    Physical Exam Constitutional:      General: He is not in acute distress.    Appearance: He is obese.  HENT:     Head: Normocephalic and atraumatic.     Mouth/Throat:     Comments: Restricted mouth opening/trismus Eyes:     General: No scleral icterus. Cardiovascular:     Rate and Rhythm: Normal rate.  Pulmonary:     Effort: Pulmonary effort is normal. No respiratory distress.     Breath sounds: Normal breath sounds. No wheezing.  Abdominal:     General: Bowel sounds are normal. There is no distension.     Palpations: Abdomen is soft.  Musculoskeletal:        General: No swelling. Normal range of motion.      Cervical back: Normal range of motion and neck supple.  Lymphadenopathy:     Cervical: Cervical adenopathy present.  Skin:    General: Skin is warm and dry.     Findings:  No erythema or rash.  Neurological:     Mental Status: He is alert and oriented to person, place, and time. Mental status is at baseline.     Cranial Nerves: No cranial nerve deficit.     Coordination: Coordination normal.  Psychiatric:        Mood and Affect: Mood normal.      LABORATORY DATA:  I have reviewed the data as listed     Latest Ref Rng & Units 12/09/2022    8:10 AM 11/02/2022    2:50 PM 01/21/2021    1:48 PM  CBC  WBC 4.0 - 10.5 K/uL 9.7  11.1  10.1   Hemoglobin 13.0 - 17.0 g/dL 16.1  09.6  04.5   Hematocrit 39.0 - 52.0 % 45.2  46.1  45.5   Platelets 150 - 400 K/uL 253  283  233       Latest Ref Rng & Units 12/09/2022    8:10 AM 11/02/2022    2:50 PM 01/21/2021    1:48 PM  CMP  Glucose 70 - 99 mg/dL 409  811  914   BUN 8 - 23 mg/dL 15  16  20    Creatinine 0.61 - 1.24 mg/dL 7.82  9.56  2.13   Sodium 135 - 145 mmol/L 139  139  139   Potassium 3.5 - 5.1 mmol/L 4.0  4.2  4.2   Chloride 98 - 111 mmol/L 101  100  101   CO2 22 - 32 mmol/L 29  27  26    Calcium 8.9 - 10.3 mg/dL 9.4  9.1  9.3   Total Protein 6.5 - 8.1 g/dL 7.2  7.3  7.3   Total Bilirubin 0.3 - 1.2 mg/dL 0.7  0.5  0.8   Alkaline Phos 38 - 126 U/L 59  53  45   AST 15 - 41 U/L 20  23  21    ALT 0 - 44 U/L 17  16  20

## 2022-12-09 NOTE — Assessment & Plan Note (Signed)
Chemotherapy plan as listed above 

## 2022-12-09 NOTE — Assessment & Plan Note (Addendum)
stage IVA oropharyngeal squamous cell carcinoma, Recommend concurrent chemotherapy with Radiation.  Rationale and side effects were reviewed with patient and he agrees with the plan. Baseline audio testing done.  Labs are reviewed and discussed with patient. Proceed with cisplatin /m2 today

## 2022-12-09 NOTE — Progress Notes (Signed)
Pt offered snacks and lunch during infusion appointment, but declined stating that it was hard for him to swallow. Asked patient if he had spoken to dietician. He stated that he had not. Weight loss noted, and chart reviewed for dietary referral, but unable to locate an order. Reached out to dietician and care team. Dietician stated that she did not have a referral for this patient, but she would add him to her list and schedule an appointment. Care team to place order for referral. Pt informed that dietician would follow-up with him.

## 2022-12-09 NOTE — Assessment & Plan Note (Signed)
Not well controlled with Tramadol.  I recommend patient to switch to oxycodone  Q6h PRN pain.  Rationale and side effects were reviewed with patient.

## 2022-12-10 ENCOUNTER — Other Ambulatory Visit: Payer: Self-pay

## 2022-12-10 ENCOUNTER — Telehealth: Payer: Self-pay

## 2022-12-10 ENCOUNTER — Ambulatory Visit
Admission: RE | Admit: 2022-12-10 | Discharge: 2022-12-10 | Disposition: A | Discharge status: Home / Self Care | Payer: Medicare Other | Source: Ambulatory Visit | Attending: Radiation Oncology | Admitting: Radiation Oncology

## 2022-12-10 DIAGNOSIS — F17220 Nicotine dependence, chewing tobacco, uncomplicated: Secondary | ICD-10-CM | POA: Diagnosis not present

## 2022-12-10 DIAGNOSIS — Z79899 Other long term (current) drug therapy: Secondary | ICD-10-CM | POA: Diagnosis not present

## 2022-12-10 DIAGNOSIS — Z510 Encounter for antineoplastic radiation therapy: Secondary | ICD-10-CM | POA: Diagnosis not present

## 2022-12-10 DIAGNOSIS — Z5111 Encounter for antineoplastic chemotherapy: Secondary | ICD-10-CM | POA: Diagnosis not present

## 2022-12-10 DIAGNOSIS — R1310 Dysphagia, unspecified: Secondary | ICD-10-CM | POA: Diagnosis not present

## 2022-12-10 DIAGNOSIS — Z86718 Personal history of other venous thrombosis and embolism: Secondary | ICD-10-CM | POA: Diagnosis not present

## 2022-12-10 DIAGNOSIS — G893 Neoplasm related pain (acute) (chronic): Secondary | ICD-10-CM | POA: Diagnosis not present

## 2022-12-10 DIAGNOSIS — C770 Secondary and unspecified malignant neoplasm of lymph nodes of head, face and neck: Secondary | ICD-10-CM | POA: Diagnosis not present

## 2022-12-10 DIAGNOSIS — C029 Malignant neoplasm of tongue, unspecified: Secondary | ICD-10-CM | POA: Diagnosis not present

## 2022-12-10 DIAGNOSIS — C109 Malignant neoplasm of oropharynx, unspecified: Secondary | ICD-10-CM | POA: Diagnosis not present

## 2022-12-10 DIAGNOSIS — Z7982 Long term (current) use of aspirin: Secondary | ICD-10-CM | POA: Diagnosis not present

## 2022-12-10 LAB — RAD ONC ARIA SESSION SUMMARY
Course Elapsed Days: 1
Plan Fractions Treated to Date: 2
Plan Prescribed Dose Per Fraction: 2 Gy
Plan Total Fractions Prescribed: 35
Plan Total Prescribed Dose: 70 Gy
Reference Point Dosage Given to Date: 4 Gy
Reference Point Session Dosage Given: 2 Gy
Session Number: 2

## 2022-12-10 NOTE — Telephone Encounter (Signed)
Telephone call to patient for follow up after receiving first infusion.   Patient states infusion went great.  States not eating good due to not able to open mouth wide but is drinking plenty of fluids.   Denies any nausea or vomiting.  Encouraged patient to call for any concerns or questions.

## 2022-12-13 ENCOUNTER — Other Ambulatory Visit: Payer: Self-pay

## 2022-12-13 ENCOUNTER — Ambulatory Visit
Admission: RE | Admit: 2022-12-13 | Discharge: 2022-12-13 | Disposition: A | Discharge status: Home / Self Care | Payer: Medicare Other | Source: Ambulatory Visit | Attending: Radiation Oncology | Admitting: Radiation Oncology

## 2022-12-13 DIAGNOSIS — Z5111 Encounter for antineoplastic chemotherapy: Secondary | ICD-10-CM | POA: Diagnosis not present

## 2022-12-13 DIAGNOSIS — Z7982 Long term (current) use of aspirin: Secondary | ICD-10-CM | POA: Diagnosis not present

## 2022-12-13 DIAGNOSIS — Z510 Encounter for antineoplastic radiation therapy: Secondary | ICD-10-CM | POA: Diagnosis not present

## 2022-12-13 DIAGNOSIS — C109 Malignant neoplasm of oropharynx, unspecified: Secondary | ICD-10-CM | POA: Diagnosis not present

## 2022-12-13 DIAGNOSIS — R1310 Dysphagia, unspecified: Secondary | ICD-10-CM | POA: Diagnosis not present

## 2022-12-13 DIAGNOSIS — C770 Secondary and unspecified malignant neoplasm of lymph nodes of head, face and neck: Secondary | ICD-10-CM | POA: Diagnosis not present

## 2022-12-13 DIAGNOSIS — G893 Neoplasm related pain (acute) (chronic): Secondary | ICD-10-CM | POA: Diagnosis not present

## 2022-12-13 DIAGNOSIS — Z79899 Other long term (current) drug therapy: Secondary | ICD-10-CM | POA: Diagnosis not present

## 2022-12-13 DIAGNOSIS — Z86718 Personal history of other venous thrombosis and embolism: Secondary | ICD-10-CM | POA: Diagnosis not present

## 2022-12-13 DIAGNOSIS — F17220 Nicotine dependence, chewing tobacco, uncomplicated: Secondary | ICD-10-CM | POA: Diagnosis not present

## 2022-12-13 DIAGNOSIS — C029 Malignant neoplasm of tongue, unspecified: Secondary | ICD-10-CM | POA: Diagnosis not present

## 2022-12-13 LAB — RAD ONC ARIA SESSION SUMMARY
Course Elapsed Days: 4
Plan Fractions Treated to Date: 3
Plan Prescribed Dose Per Fraction: 2 Gy
Plan Total Fractions Prescribed: 35
Plan Total Prescribed Dose: 70 Gy
Reference Point Dosage Given to Date: 6 Gy
Reference Point Session Dosage Given: 2 Gy
Session Number: 3

## 2022-12-14 ENCOUNTER — Other Ambulatory Visit: Payer: Self-pay

## 2022-12-14 ENCOUNTER — Other Ambulatory Visit: Payer: Self-pay | Admitting: *Deleted

## 2022-12-14 ENCOUNTER — Ambulatory Visit
Admission: RE | Admit: 2022-12-14 | Discharge: 2022-12-14 | Disposition: A | Discharge status: Home / Self Care | Payer: Medicare Other | Source: Ambulatory Visit | Attending: Radiation Oncology | Admitting: Radiation Oncology

## 2022-12-14 DIAGNOSIS — Z5111 Encounter for antineoplastic chemotherapy: Secondary | ICD-10-CM | POA: Diagnosis not present

## 2022-12-14 DIAGNOSIS — G893 Neoplasm related pain (acute) (chronic): Secondary | ICD-10-CM | POA: Diagnosis not present

## 2022-12-14 DIAGNOSIS — C029 Malignant neoplasm of tongue, unspecified: Secondary | ICD-10-CM | POA: Diagnosis not present

## 2022-12-14 DIAGNOSIS — C770 Secondary and unspecified malignant neoplasm of lymph nodes of head, face and neck: Secondary | ICD-10-CM | POA: Diagnosis not present

## 2022-12-14 DIAGNOSIS — R1310 Dysphagia, unspecified: Secondary | ICD-10-CM | POA: Diagnosis not present

## 2022-12-14 DIAGNOSIS — F17220 Nicotine dependence, chewing tobacco, uncomplicated: Secondary | ICD-10-CM | POA: Diagnosis not present

## 2022-12-14 DIAGNOSIS — C109 Malignant neoplasm of oropharynx, unspecified: Secondary | ICD-10-CM | POA: Diagnosis not present

## 2022-12-14 DIAGNOSIS — Z86718 Personal history of other venous thrombosis and embolism: Secondary | ICD-10-CM | POA: Diagnosis not present

## 2022-12-14 DIAGNOSIS — Z7982 Long term (current) use of aspirin: Secondary | ICD-10-CM | POA: Diagnosis not present

## 2022-12-14 DIAGNOSIS — Z510 Encounter for antineoplastic radiation therapy: Secondary | ICD-10-CM | POA: Diagnosis not present

## 2022-12-14 DIAGNOSIS — Z79899 Other long term (current) drug therapy: Secondary | ICD-10-CM | POA: Diagnosis not present

## 2022-12-14 LAB — RAD ONC ARIA SESSION SUMMARY
Course Elapsed Days: 5
Plan Fractions Treated to Date: 4
Plan Prescribed Dose Per Fraction: 2 Gy
Plan Total Fractions Prescribed: 35
Plan Total Prescribed Dose: 70 Gy
Reference Point Dosage Given to Date: 8 Gy
Reference Point Session Dosage Given: 2 Gy
Session Number: 4

## 2022-12-15 ENCOUNTER — Other Ambulatory Visit: Payer: Self-pay

## 2022-12-15 ENCOUNTER — Ambulatory Visit: Payer: Medicare Other | Attending: Radiation Oncology | Admitting: Speech Pathology

## 2022-12-15 ENCOUNTER — Other Ambulatory Visit: Payer: Self-pay | Admitting: *Deleted

## 2022-12-15 ENCOUNTER — Ambulatory Visit
Admission: RE | Admit: 2022-12-15 | Discharge: 2022-12-15 | Disposition: A | Discharge status: Home / Self Care | Payer: Medicare Other | Source: Ambulatory Visit | Attending: Radiation Oncology | Admitting: Radiation Oncology

## 2022-12-15 DIAGNOSIS — Z86718 Personal history of other venous thrombosis and embolism: Secondary | ICD-10-CM | POA: Diagnosis not present

## 2022-12-15 DIAGNOSIS — C109 Malignant neoplasm of oropharynx, unspecified: Secondary | ICD-10-CM

## 2022-12-15 DIAGNOSIS — G893 Neoplasm related pain (acute) (chronic): Secondary | ICD-10-CM | POA: Diagnosis not present

## 2022-12-15 DIAGNOSIS — R1310 Dysphagia, unspecified: Secondary | ICD-10-CM | POA: Diagnosis not present

## 2022-12-15 DIAGNOSIS — Z510 Encounter for antineoplastic radiation therapy: Secondary | ICD-10-CM | POA: Diagnosis not present

## 2022-12-15 DIAGNOSIS — Z7982 Long term (current) use of aspirin: Secondary | ICD-10-CM | POA: Diagnosis not present

## 2022-12-15 DIAGNOSIS — F17220 Nicotine dependence, chewing tobacco, uncomplicated: Secondary | ICD-10-CM | POA: Diagnosis not present

## 2022-12-15 DIAGNOSIS — R1312 Dysphagia, oropharyngeal phase: Secondary | ICD-10-CM | POA: Diagnosis not present

## 2022-12-15 DIAGNOSIS — C770 Secondary and unspecified malignant neoplasm of lymph nodes of head, face and neck: Secondary | ICD-10-CM | POA: Diagnosis not present

## 2022-12-15 DIAGNOSIS — Z79899 Other long term (current) drug therapy: Secondary | ICD-10-CM | POA: Diagnosis not present

## 2022-12-15 DIAGNOSIS — R471 Dysarthria and anarthria: Secondary | ICD-10-CM

## 2022-12-15 DIAGNOSIS — C029 Malignant neoplasm of tongue, unspecified: Secondary | ICD-10-CM | POA: Diagnosis not present

## 2022-12-15 DIAGNOSIS — R252 Cramp and spasm: Secondary | ICD-10-CM

## 2022-12-15 DIAGNOSIS — Z5111 Encounter for antineoplastic chemotherapy: Secondary | ICD-10-CM | POA: Diagnosis not present

## 2022-12-15 LAB — RAD ONC ARIA SESSION SUMMARY
Course Elapsed Days: 6
Plan Fractions Treated to Date: 5
Plan Prescribed Dose Per Fraction: 2 Gy
Plan Total Fractions Prescribed: 35
Plan Total Prescribed Dose: 70 Gy
Reference Point Dosage Given to Date: 10 Gy
Reference Point Session Dosage Given: 2 Gy
Session Number: 5

## 2022-12-15 MED FILL — Dexamethasone Sodium Phosphate Inj 100 MG/10ML: INTRAMUSCULAR | Qty: 1 | Status: AC

## 2022-12-15 MED FILL — Fosaprepitant Dimeglumine For IV Infusion 150 MG (Base Eq): INTRAVENOUS | Qty: 5 | Status: AC

## 2022-12-15 NOTE — Therapy (Signed)
OUTPATIENT SPEECH LANGUAGE PATHOLOGY ONCOLOGY EVALUATION   Patient Name: Kenneth Gilmore MRN: [id number] DOB:November 20, 1950, 72 y.o., male Today's Date: 12/15/2022  PCP: No PCP REFERRING PROVIDER: Carmina Miller, MD  END OF SESSION:  End of Session - 12/15/22 1556     Visit Number 1    Number of Visits 17    Date for SLP Re-Evaluation 02/09/23    Authorization Type [employer]    Progress Note Due on Visit 10    SLP Start Time [phone]    SLP Stop Time  0945    SLP Time Calculation (min) 55 min    Activity Tolerance Patient tolerated treatment well             Past Medical History:  Diagnosis Date   Diabetes mellitus without complication    Hyperlipidemia    Hypertension    Past Surgical History:  Procedure Laterality Date   arm surgery Left    CORONARY ANGIOPLASTY WITH STENT PLACEMENT     left side rotator cuff surgery     SHOULDER SURGERY     Patient Active Problem List   Diagnosis Date Noted   Encounter for antineoplastic chemotherapy 12/09/2022   Neoplasm related pain 12/09/2022   Goals of care, counseling/discussion 11/02/2022   History of DVT (deep vein thrombosis) 11/02/2022   Oropharyngeal cancer 10/28/2022    ONSET DATE: 10/28/2022 date of diagnosis;  12/14/2022 date of referral  REFERRING DIAG: C10.9 (ICD-10-CM) - Oropharyngeal cancer (HCC)   THERAPY DIAG:  Trismus  Oropharyngeal cancer  Dysarthria and anarthria  Dysphagia, oropharyngeal phase  Rationale for Evaluation and Treatment: Rehabilitation  SUBJECTIVE:   SUBJECTIVE STATEMENT: Pt with flat affect, decreased speech intelligibility, moderate historian Pt accompanied by: self  PERTINENT HISTORY:  Pt is a 72 year old male with past medical history of DVT who is currently undergoing concurrent chemoradiation treatment for at 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   DIAGNOSTIC FINDINGS:   CT Soft tissue neck - 09/30/2022 Infiltrating mass in the right  tongue measuring  up to 4 cm. Prominent submucosal involvement including in the right  parapharyngeal fat. Assessment of relationship to the pterygoids and  extrinsic tongue musculature is somewhat hindered by streak artifact  from dental amalgam.   US biopsy - 10/22/2022 Lymph node, right cervical: Positive for malignancy; Metastatic Squamous Cell Carcinoma, Keratinizing, in a background of dense fibrosis and tumor necrosis An immunohistochemical study directed against p16 is strongly and  diffusely positive, indicating an HPV driven malignancy.   PAIN:  Are you having pain? Yes: NPRS scale: 8/10 Pain location: right tongue and jaw Pain description: pt with difficulty describing Aggravating factors: none Relieving factors: heat  FALLS: Has patient fallen in last 6 months?  No  LIVING ENVIRONMENT: Lives with: lives alone Lives in: House/apartment  PLOF:  Level of assistance: Independent with ADLs, Independent with IADLs Employment: Retired  PATIENT GOALS: to improve mandibular opening  OBJECTIVE:    RECOMMENDATIONS FROM OBJECTIVE SWALLOW STUDY (MBSS/FEES):    To be scheduled  COGNITION: Overall cognitive status: Within functional limits for tasks assessed  LANGUAGE: Receptive and Expressive language appeared WNL.  ORAL MOTOR EXAMINATION: Overall status: Impaired:   Lingual: Right (ROM, Symmetry, Strength, and Sensation) Mandible: ROM  Comments: Pt presents with severe trismus as evidenced by max MIO of 15.9 mm (average male opening is considered   50-41mm is the average male adult MIO  MOTOR SPEECH: Overall motor speech: impaired Level of impairment: Phrase Respiration: diaphragmatic/abdominal breathing Phonation:  wet and low vocal intensity Resonance: hyponasality Articulation: Impaired: phrase Intelligibility: Intelligibility reduced Motor planning: Impaired: N/A Interfering components:  severe trismus, tongue pain Effective technique: increased vocal  intensity  CLINICAL SWALLOW ASSESSMENT:   Current diet: Dysphagia 3 (mechanical soft) Objective swallow impairments: reduced hyolaryngeal excursion with palpation to neck Dentition: {dentition:27197} Patient directly observed with POs: {POobserved:27199} Feeding: {slp feeding:27200} Liquids provided by: cup Oral phase signs and symptoms: prolonged bolus formation Pharyngeal phase signs and symptoms: suspected delayed swallow initiation, multiple swallows, wet vocal quality, and immediate throat clear   PATIENT REPORTED OUTCOME MEASURES (PROM): To be completed within the next 3 sessions  TODAY'S TREATMENT:                                                                                                                                         Skilled verbal and written education provided on trismus. SLP further facilitated session by providing pt with individualized   Research states the risk for dysphagia increases due to radiation and/or chemotherapy treatment due to a variety of factors, so SLP educated the pt about the possibility of reduced/limited ability for PO intake during rad tx. SLP also educated pt regarding possible changes to swallowing musculature after rad tx, and why adherence to dysphagia HEP provided today and PO consumption was necessary to inhibit muscle fibrosis following rad tx and to mitigate muscle disuse atrophy. SLP informed pt why this would be detrimental to their swallowing status and to their pulmonary health. Pt demonstrated understanding of these things to SLP. SLP encouraged pt to safely eat and drink as deep into their radiation/chemotherapy as possible to provide the best possible long-term swallowing outcome for pt.    SLP then developed an individualized HEP for pt involving *** strengthening and ROM and pt was instructed how to perform these exercises, including SLP demonstration. After SLP demonstration, pt return demonstrated each exercise. SLP ensured pt  performance was correct prior to educating pt on next exercise. Pt required *** cues faded to modified independent to perform HEP. Pt was instructed to complete this program 6-7 days/week, at least 2 times a day until 6 months after his or her last day of rad tx, and then x2 a week after that, indefinitely. Among other modifications for days when pt cannot functionally swallow, SLP also suggested pt to perform only non-swallowing tasks on the handout/HEP, and if necessary to cycle through the swallowing portion so the full program of exercises can be completed instead of fatiguing on one of the swallowing exercises and being unable to perform the other swallowing exercises. SLP instructed that swallowing exercises should then be added back into the regimen as pt is able to do so. Secondly, pt was told that former patients have told SLP that during their course of radiation therapy, taking prescribed pain medication just prior to performing HEP (and eating/drinking) has proven helpful in completing HEP (and  eating and drinking) more regularly when going through their course of radiation treatment. ***   PATIENT EDUCATION: Education details: basic mandibular range of motion exercises, late effects head/neck radiation on swallow function, HEP procedure, modification to HEP when difficulty experienced with swallowing during and after radiation course, and need for instrumental swallow study Person educated: Patient Education method: Explanation Education comprehension: verbalized understanding and needs further education   GOALS: Goals reviewed with patient? Yes  SHORT TERM GOALS: Target date: 10 sessions  *** Baseline: Goal status: {GOALSTATUS:25110}  2.  *** Baseline:  Goal status: {GOALSTATUS:25110}  3.  *** Baseline:  Goal status: {GOALSTATUS:25110}  4.  *** Baseline:  Goal status: {GOALSTATUS:25110}  5.  *** Baseline:  Goal status: {GOALSTATUS:25110}  6.  *** Baseline:  Goal  status: {GOALSTATUS:25110}  LONG TERM GOALS: Target date: 02/09/2023  *** Baseline:  Goal status: {GOALSTATUS:25110}  2.  *** Baseline:  Goal status: {GOALSTATUS:25110}  3.  *** Baseline:  Goal status: {GOALSTATUS:25110}  4.  *** Baseline:  Goal status: {GOALSTATUS:25110}  5.  *** Baseline:  Goal status: {GOALSTATUS:25110}  6.  *** Baseline:  Goal status: {GOALSTATUS:25110} ASSESSMENT:  CLINICAL IMPRESSION: Patient is a 72 y.o. male who was seen today for assessment of swallowing and trismus as they undergo radiation/chemoradiation therapy. Today pt ate items from {oncodiet:27393} and drank {oncoliquids:27394} liquids. POs: Pt ate {oncofood:27395} and drank {oncoliquids:27394} liquids without overt s/s oral or pharyngeal difficulty. At this time pt swallowing is deemed WNL/WFL with these POs. No oral or overt s/sx pharyngeal deficits, including aspiration were observed. There are no overt s/s aspiration PNA observed by SLP nor any reported by pt at this time. Data indicate that pt's swallow ability will likely decrease over the course of radiation/chemoradiation therapy and could very well decline over time following the conclusion of that therapy due to muscle disuse atrophy and/or muscle fibrosis. Pt will cont to need to be seen by SLP in order to assess safety of PO intake, assess the need for recommending any objective swallow assessment, and ensuring pt is correctly completing the individualized HEP.  OBJECTIVE IMPAIRMENTS: include dysarthria, dysphagia, and trismus. These impairments are limiting patient from effectively communicating at home and in community and safety when swallowing. Factors affecting potential to achieve goals and functional outcome are medical prognosis and poor health literacy. Patient will benefit from skilled SLP services to address above impairments and improve overall function.  REHAB POTENTIAL: Good  PLAN:  SLP FREQUENCY: 1-2x/week  SLP  DURATION: 8 weeks  PLANNED INTERVENTIONS: Aspiration precaution training, Pharyngeal strengthening exercises, Diet toleration management , Trials of upgraded texture/liquids, SLP instruction and feedback, Compensatory strategies, and Patient/family education   Jennipher Weatherholtz B. Dreama Saa, M.S., CCC-SLP, Tree surgeon Certified Brain Injury Specialist [facility]  [facility] Office [phone] Ascom [phone] Fax [phone]

## 2022-12-16 ENCOUNTER — Inpatient Hospital Stay: Payer: Medicare Other

## 2022-12-16 ENCOUNTER — Encounter: Payer: Self-pay | Admitting: Oncology

## 2022-12-16 ENCOUNTER — Inpatient Hospital Stay (HOSPITAL_BASED_OUTPATIENT_CLINIC_OR_DEPARTMENT_OTHER): Payer: Medicare Other | Admitting: Oncology

## 2022-12-16 ENCOUNTER — Ambulatory Visit
Admission: RE | Admit: 2022-12-16 | Discharge: 2022-12-16 | Disposition: A | Discharge status: Home / Self Care | Payer: Medicare Other | Source: Ambulatory Visit | Attending: Radiation Oncology | Admitting: Radiation Oncology

## 2022-12-16 ENCOUNTER — Other Ambulatory Visit: Payer: Self-pay

## 2022-12-16 VITALS — BP 110/67 | HR 63 | Temp 96.1°F | Resp 18 | Wt 190.3 lb

## 2022-12-16 DIAGNOSIS — G893 Neoplasm related pain (acute) (chronic): Secondary | ICD-10-CM

## 2022-12-16 DIAGNOSIS — R1310 Dysphagia, unspecified: Secondary | ICD-10-CM | POA: Diagnosis not present

## 2022-12-16 DIAGNOSIS — C109 Malignant neoplasm of oropharynx, unspecified: Secondary | ICD-10-CM

## 2022-12-16 DIAGNOSIS — B370 Candidal stomatitis: Secondary | ICD-10-CM

## 2022-12-16 DIAGNOSIS — C770 Secondary and unspecified malignant neoplasm of lymph nodes of head, face and neck: Secondary | ICD-10-CM | POA: Diagnosis not present

## 2022-12-16 DIAGNOSIS — Z5111 Encounter for antineoplastic chemotherapy: Secondary | ICD-10-CM | POA: Diagnosis not present

## 2022-12-16 DIAGNOSIS — Z86718 Personal history of other venous thrombosis and embolism: Secondary | ICD-10-CM

## 2022-12-16 DIAGNOSIS — F17220 Nicotine dependence, chewing tobacco, uncomplicated: Secondary | ICD-10-CM | POA: Diagnosis not present

## 2022-12-16 DIAGNOSIS — Z79899 Other long term (current) drug therapy: Secondary | ICD-10-CM | POA: Diagnosis not present

## 2022-12-16 DIAGNOSIS — Z510 Encounter for antineoplastic radiation therapy: Secondary | ICD-10-CM | POA: Diagnosis not present

## 2022-12-16 DIAGNOSIS — R1311 Dysphagia, oral phase: Secondary | ICD-10-CM

## 2022-12-16 DIAGNOSIS — C029 Malignant neoplasm of tongue, unspecified: Secondary | ICD-10-CM | POA: Diagnosis not present

## 2022-12-16 DIAGNOSIS — Z7982 Long term (current) use of aspirin: Secondary | ICD-10-CM | POA: Diagnosis not present

## 2022-12-16 LAB — CBC WITH DIFFERENTIAL (CANCER CENTER ONLY)
Abs Immature Granulocytes: 0.05 10*3/uL (ref 0.00–0.07)
Basophils Absolute: 0.1 10*3/uL (ref 0.0–0.1)
Basophils Relative: 1 %
Eosinophils Absolute: 0.2 10*3/uL (ref 0.0–0.5)
Eosinophils Relative: 2 %
HCT: 45.6 % (ref 39.0–52.0)
Hemoglobin: 15.6 g/dL (ref 13.0–17.0)
Immature Granulocytes: 0 %
Lymphocytes Relative: 29 %
Lymphs Abs: 3.3 10*3/uL (ref 0.7–4.0)
MCH: 31.8 pg (ref 26.0–34.0)
MCHC: 34.2 g/dL (ref 30.0–36.0)
MCV: 92.9 fL (ref 80.0–100.0)
Monocytes Absolute: 1 10*3/uL (ref 0.1–1.0)
Monocytes Relative: 9 %
Neutro Abs: 6.8 10*3/uL (ref 1.7–7.7)
Neutrophils Relative %: 59 %
Platelet Count: 273 10*3/uL (ref 150–400)
RBC: 4.91 MIL/uL (ref 4.22–5.81)
RDW: 12 % (ref 11.5–15.5)
WBC Count: 11.4 10*3/uL — ABNORMAL HIGH (ref 4.0–10.5)
nRBC: 0 % (ref 0.0–0.2)

## 2022-12-16 LAB — RAD ONC ARIA SESSION SUMMARY
Course Elapsed Days: 7
Plan Fractions Treated to Date: 6
Plan Prescribed Dose Per Fraction: 2 Gy
Plan Total Fractions Prescribed: 35
Plan Total Prescribed Dose: 70 Gy
Reference Point Dosage Given to Date: 12 Gy
Reference Point Session Dosage Given: 2 Gy
Session Number: 6

## 2022-12-16 LAB — CMP (CANCER CENTER ONLY)
ALT: 20 U/L (ref 0–44)
AST: 23 U/L (ref 15–41)
Albumin: 4.1 g/dL (ref 3.5–5.0)
Alkaline Phosphatase: 58 U/L (ref 38–126)
Anion gap: 11 (ref 5–15)
BUN: 16 mg/dL (ref 8–23)
CO2: 26 mmol/L (ref 22–32)
Calcium: 9.3 mg/dL (ref 8.9–10.3)
Chloride: 97 mmol/L — ABNORMAL LOW (ref 98–111)
Creatinine: 0.86 mg/dL (ref 0.61–1.24)
GFR, Estimated: >60 mL/min (ref >60)
Glucose, Bld: 132 mg/dL — ABNORMAL HIGH (ref 70–99)
Potassium: 4.1 mmol/L (ref 3.5–5.1)
Sodium: 134 mmol/L — ABNORMAL LOW (ref 135–145)
Total Bilirubin: 0.7 mg/dL (ref 0.3–1.2)
Total Protein: 7.5 g/dL (ref 6.5–8.1)

## 2022-12-16 LAB — MAGNESIUM: Magnesium: 1.7 mg/dL (ref 1.7–2.4)

## 2022-12-16 MED ORDER — SODIUM CHLORIDE 0.9 % IV SOLN
150.0000 mg | Freq: Once | INTRAVENOUS | Status: AC
  Administered 2022-12-16: 150 mg via INTRAVENOUS
  Filled 2022-12-16: qty 150

## 2022-12-16 MED ORDER — NYSTATIN 100000 UNIT/ML MT SUSP: 5.0000 mL | Freq: Four times a day (QID) | OROMUCOSAL | 1 refills | Status: DC

## 2022-12-16 MED ORDER — SODIUM CHLORIDE 0.9 % IV SOLN
Freq: Once | INTRAVENOUS | Status: AC
  Filled 2022-12-16: qty 250

## 2022-12-16 MED ORDER — SODIUM CHLORIDE 0.9 % IV SOLN
40.0000 mg/m2 | Freq: Once | INTRAVENOUS | Status: AC
  Administered 2022-12-16: 84 mg via INTRAVENOUS
  Filled 2022-12-16: qty 84

## 2022-12-16 MED ORDER — RIVAROXABAN 10 MG PO TABS: 10.0000 mg | ORAL_TABLET | Freq: Every day | ORAL | 1 refills | Status: DC

## 2022-12-16 MED ORDER — MAGNESIUM SULFATE 2 GM/50ML IV SOLN
2.0000 g | Freq: Once | INTRAVENOUS | Status: AC
  Administered 2022-12-16: 2 g via INTRAVENOUS
  Filled 2022-12-16: qty 50

## 2022-12-16 MED ORDER — PALONOSETRON HCL INJECTION 0.25 MG/5ML
0.2500 mg | Freq: Once | INTRAVENOUS | Status: AC
  Administered 2022-12-16: 0.25 mg via INTRAVENOUS
  Filled 2022-12-16: qty 5

## 2022-12-16 MED ORDER — POTASSIUM CHLORIDE IN NACL 20-0.9 MEQ/L-% IV SOLN
Freq: Once | INTRAVENOUS | Status: AC
  Filled 2022-12-16: qty 1000

## 2022-12-16 MED ORDER — SODIUM CHLORIDE 0.9 % IV SOLN
10.0000 mg | Freq: Once | INTRAVENOUS | Status: AC
  Administered 2022-12-16: 10 mg via INTRAVENOUS
  Filled 2022-12-16: qty 10

## 2022-12-16 NOTE — Assessment & Plan Note (Addendum)
stage IVA oropharyngeal squamous cell carcinoma, Recommend concurrent chemotherapy with Radiation.  Rationale and side effects were reviewed with patient and he agrees with the plan. Baseline audio testing done.  Labs are reviewed and discussed with patient. Proceed with cisplatin /m2 today  Pre-existing Trismus.

## 2022-12-16 NOTE — Assessment & Plan Note (Signed)
oxycodone  Q6h PRN pain.

## 2022-12-16 NOTE — Assessment & Plan Note (Signed)
Continue Xarelto 10 mg daily for prophylaxis. Patient is also on aspirin 81 mg daily for history of CAD status post percutaneous coronary angioplasty. 

## 2022-12-16 NOTE — Progress Notes (Signed)
MD okay with post IVF running with treatment.

## 2022-12-16 NOTE — Patient Instructions (Signed)
[facility]  Discharge Instructions: Thank you for choosing [facility] to provide your oncology and hematology care.  If you have a lab appointment with the Cancer Center, please go directly to the Cancer Center and check in at the registration area.  Wear comfortable clothing and clothing appropriate for easy access to any Portacath or PICC line.   We strive to give you quality time with your provider. You may need to reschedule your appointment if you arrive late (15 or more minutes).  Arriving late affects you and other patients whose appointments are after yours.  Also, if you miss three or more appointments without notifying the office, you may be dismissed from the clinic at the provider's discretion.      For prescription refill requests, have your pharmacy contact our office and allow 72 hours for refills to be completed.    Today you received the following chemotherapy and/or immunotherapy agents Cisplatin      To help prevent nausea and vomiting after your treatment, we encourage you to take your nausea medication as directed.  BELOW ARE SYMPTOMS THAT SHOULD BE REPORTED IMMEDIATELY: *FEVER GREATER THAN 100.4 F (38 C) OR HIGHER *CHILLS OR SWEATING *NAUSEA AND VOMITING THAT IS NOT CONTROLLED WITH YOUR NAUSEA MEDICATION *UNUSUAL SHORTNESS OF BREATH *UNUSUAL BRUISING OR BLEEDING *URINARY PROBLEMS (pain or burning when urinating, or frequent urination) *BOWEL PROBLEMS (unusual diarrhea, constipation, pain near the anus) TENDERNESS IN MOUTH AND THROAT WITH OR WITHOUT PRESENCE OF ULCERS (sore throat, sores in mouth, or a toothache) UNUSUAL RASH, SWELLING OR PAIN  UNUSUAL VAGINAL DISCHARGE OR ITCHING   Items with * indicate a potential emergency and should be followed up as soon as possible or go to the Emergency Department if any problems should occur.  Please show the CHEMOTHERAPY ALERT CARD or IMMUNOTHERAPY ALERT CARD at check-in to  the Emergency Department and triage nurse.  Should you have questions after your visit or need to cancel or reschedule your appointment, please contact [facility]  [phone] and follow the prompts.  Office hours are 8:00 a.m. to 4:30 p.m. Monday - Friday. Please note that voicemails left after 4:00 p.m. may not be returned until the following business day.  We are closed weekends and major holidays. You have access to a nurse at all times for urgent questions. Please call the main number to the clinic [phone] and follow the prompts.  For any non-urgent questions, you may also contact your provider using MyChart. We now offer e-Visits for anyone 18 and older to request care online for non-urgent symptoms. For details visit [email or web address].   Also download the MyChart app! Go to the app store, search "MyChart", open the app, select [facility], and log in with your MyChart username and password.    

## 2022-12-16 NOTE — Progress Notes (Signed)
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  Past medical history of DM, DVT, HLD, HTN.  Patient receiving concurrent chemotherapy and radiation.   Met with patient during infusion.  He is having trouble opening his mouth wide enough to eat (trismus)  Was evaluated by SLP yesterday.  Usually in the mornings he gets a sausage biscuit but it takes him 30 minutes to eat it.  He has to eat small amounts.  He has been able to eat thin pizza from buffet, also mashed potatoes and gravy, soups.  Has trouble with tremors so makes it difficult to eat with a spoon.  Often drinks soup from bowl.  Has not tried oral nutrition supplements.  Has been drinking milkshakes but getting tired of them.  "I think I might get sick if a drink another one."  If I could open my mouth I could eat."  I am doing the best I can."      Medications: compazine, zofran, vit D, Vit B 12, metformin   Labs: Na 134, glucose 132   Anthropometrics:   Height: 70 inches Weight: 190 lb 4.8 oz today UBW: 205-210 lb about 3-4 months ago BMI: 27  7% weight loss in the last 3-4 months, significant   Estimated Energy Needs  Kcals: 2150-2580 Protein: 108-129 g Fluid: 2150-2580 ml   NUTRITION DIAGNOSIS: Inadequate oral intake related to cancer/trismus as evidenced by 7% weight loss in the last 3-4 months and decreased oral intake    INTERVENTION:  Discussed soft, moist foods high in calories and protein. Encouraged chopping,grinding, pureeing foods if needed Samples of ensure complete given for patient to try.  Recommend 350 calorie shake 3-4 times a day Encouraged patient to continue to work with SLP Discussed briefly option of feeding tube with limited ability to open mouth and anticipated side effects from treatment.  Patient states that he does not want to do that but use it as a "last resort."   Contact information  provided   MONITORING, EVALUATION, GOAL: weight trends, intake   Next Visit: Thursday, May 2 during infusion  Scottie Stanish B. Freida Busman, RD, LDN Registered Dietitian [phone]

## 2022-12-16 NOTE — Assessment & Plan Note (Signed)
Chemotherapy plan as listed above 

## 2022-12-16 NOTE — Assessment & Plan Note (Signed)
Follow up with Nutritionist

## 2022-12-16 NOTE — Assessment & Plan Note (Addendum)
Recommend nystatin mouth rinse swish and spit. Rx sent.

## 2022-12-16 NOTE — Progress Notes (Signed)
Hematology/Oncology Progress note [phone] [phone]      REASON FOR VISIT Follow up for oropharyngeal squamous cell carcinoma  ASSESSMENT & PLAN:   Cancer Staging  Oropharyngeal cancer Staging form: Oral Cavity, AJCC 8th Edition - Clinical: Stage IVA (cT3, cN2a, cM0) - Signed by Kenneth Patience, MD on 11/02/2022   Oropharyngeal cancer (HCC) stage IVA oropharyngeal squamous cell carcinoma, Recommend concurrent chemotherapy with Radiation.  Rationale and side effects were reviewed with patient and he agrees with the plan. Baseline audio testing done.  Labs are reviewed and discussed with patient. Proceed with cisplatin /m2 today  Pre-existing Trismus.   History of DVT (deep vein thrombosis) Continue Xarelto 10 mg daily for prophylaxis. Patient is also on aspirin 81 mg daily for history of CAD status post percutaneous coronary angioplasty.  Encounter for antineoplastic chemotherapy Chemotherapy plan as listed above  Neoplasm related pain  oxycodone  Q6h PRN pain.    Dysphagia Follow up with Nutritionist   Thrush Recommend nystatin mouth rinse swish and spit. Rx sent.    Orders Placed This Encounter  Procedures   CMP (Cancer Center only)    Standing Status:   Future    Standing Expiration Date:   12/30/2023   CBC with Differential (Cancer Center Only)    Standing Status:   Future    Standing Expiration Date:   12/30/2023   Magnesium    Standing Status:   Future    Standing Expiration Date:   12/30/2023   Follow-up 1 week lab MD cisplatin.  All questions were answered. The patient knows to call the clinic with any problems, questions or concerns.  Kenneth Patience, MD, PhD [facility] Hematology Oncology 12/16/2022   HISTORY OF PRESENTING ILLNESS:  Patient presented outpatient ultrasound of his left lower extremity showing positive for DVT. Denies any recent,, surgery, travel or issue.  Denies any family history of DVT.  Reports that he developed  left lower extremity swelling for a week, associated with moderate severity throbbing aching discomfort feels like pulling a muscle.  Denies any shortness of breath. Patient was started on Xarelto starting kit and advised to follow-up with heme-onc.  he take Xarelto  daily. Also on Aspirin  daily. Tolerates well.Denies hematochezia, hematuria, hematemesis, epistaxis, black tarry stool or easy bruising.   INTERVAL HISTORY Kenneth Gilmore is a 72 y.o. male presents to re-establish care for oropharyngeal squamous cell carcinoma  Oncology History  Oropharyngeal cancer  09/28/2022 Imaging   CT soft tissue neck with contrast showed infiltrating mass in the right tongue with multiple ipsilateral nodal metastases. The primary mass measures at least 4 cm and infiltrates parapharyngeal fat, visualization of tumor extent is limited by artifact from dental amalgam.   10/28/2022 Initial Diagnosis   Squamous cell carcinoma of tongue (HCC) Patient has throat pain and was evaluated by ENT Dr.Bennett.  Physical examination showed cervical lymphadenopathy.  10/15/2022 ultrasound-guided biopsy of right cervical lymph node showed fibrous tissue, detach minute salivary gland tissue.  Granular debris's and a few dysplastic squamous epithelial cells, nondiagnostic.  10/22/2022 repeat ultrasound-guided biopsy of right cervical lymph node was positive for malignancy, metastatic squamous cell carcinoma, keratinizing, in a background of dense fibrosis and tumor necrosis.   10/28/2022 Cancer Staging   Staging form: Oral Cavity, AJCC 8th Edition - Clinical: Stage IVA (cT3, cN2a, cM0) - Signed by Kenneth Patience, MD on 11/02/2022   11/16/2022 Imaging   PET scan showed 1. Large infiltrating right oropharyngeal mass is markedly hypermetabolic and consistent with  known squamous cell carcinoma. 2. Bilateral hypermetabolic metastatic cervical lymphadenopathy. 3. No evidence of metastatic disease involving the  chest, abdomen/pelvis or bony structures.     12/09/2022 -  Chemotherapy   Patient is on Treatment Plan : HEAD/NECK Cisplatin (40) q7d     He uses dipping tobacco and tries to quit. He reports mild swallowing difficulty.  Right ear pain, right ear sharp pain is intermittent, pian is better after taking oxycodone.  Tongue pain as well.  He reports taking all his medication, not able to recall what medications he is taking. I ask him to bring his medication to clinic next visit  Not able to open his mouth wide for 2-3 months, pre-existing condition prior to chemo RT   Review of Systems  Constitutional:  Negative for chills, fever, malaise/fatigue and weight loss.  HENT:  Negative for sore throat.        Sore throat, right ear pain  Eyes:  Negative for redness.  Respiratory:  Negative for cough, shortness of breath and wheezing.   Cardiovascular:  Negative for chest pain and palpitations.       Trace edema bilaterally  Gastrointestinal:  Negative for abdominal pain, blood in stool, nausea and vomiting.  Genitourinary:  Negative for dysuria.  Musculoskeletal:  Negative for myalgias.  Skin:  Negative for rash.  Neurological:  Negative for dizziness, tingling and tremors.  Endo/Heme/Allergies:  Does not bruise/bleed easily.  Psychiatric/Behavioral:  Negative for hallucinations.     MEDICAL HISTORY:  Past Medical History:  Diagnosis Date   Diabetes mellitus without complication    Hyperlipidemia    Hypertension     SURGICAL HISTORY: Past Surgical History:  Procedure Laterality Date   arm surgery Left    CORONARY ANGIOPLASTY WITH STENT PLACEMENT     left side rotator cuff surgery     SHOULDER SURGERY      SOCIAL HISTORY: Social History   Socioeconomic History   Marital status: Single    Spouse name: Not on file   Number of children: Not on file   Years of education: Not on file   Highest education level: Not on file  Occupational History   Occupation: retired   Tobacco Use   Smoking status: Never   Smokeless tobacco: Current    Types: Chew  Substance and Sexual Activity   Alcohol use: No   Drug use: Never   Sexual activity: Not on file  Other Topics Concern   Not on file  Social History Narrative   Not on file   Social Determinants of Health   Financial Resource Strain: Not on file  Food Insecurity: Not on file  Transportation Needs: Not on file  Physical Activity: Not on file  Stress: Not on file  Social Connections: Not on file  Intimate Partner Violence: Not on file    FAMILY HISTORY: Family History  Problem Relation Age of Onset   Cancer Father        lung    ALLERGIES:  has No Known Allergies.  MEDICATIONS:  Current Outpatient Medications  Medication Sig Dispense Refill   aspirin 81 MG tablet Take 81 mg by mouth daily.     atorvastatin (LIPITOR) 80 MG tablet TAKE ONE-HALF TABLET BY MOUTH AT BEDTIME FOR HIGH CHOLESTEROL REPLACES SIMVASTATIN     Cholecalciferol 25 MCG (1000 UT) tablet Take 1 tablet by mouth daily.     dexamethasone (DECADRON) 4 MG tablet Take 2 tablets (8 mg) by mouth daily x 2 days starting the  day after cisplatin chemotherapy. Take with food. 30 tablet 1   empagliflozin (JARDIANCE) 10 MG TABS tablet Take by mouth.     gabapentin (NEURONTIN) 300 MG capsule TAKE 1 CAPSULE BY MOUTH TWO TIMES A DAY     lisinopril (PRINIVIL,ZESTRIL) 5 MG tablet Take by mouth. Take 1 tab by mouth once daily     metFORMIN (GLUCOPHAGE) 500 MG tablet Take 500 mg by mouth 2 (two) times daily.     metoprolol tartrate (LOPRESSOR) 50 MG tablet Take 50 mg by mouth 2 (two) times daily.     ondansetron (ZOFRAN) 8 MG tablet Take 1 tablet (8 mg total) by mouth every 8 (eight) hours as needed for nausea or vomiting. Start on the third day after cisplatin. 30 tablet 1   oxyCODONE (OXY IR/ROXICODONE) 5 MG immediate release tablet Take 1 tablet (5 mg total) by mouth every 6 (six) hours as needed for severe pain. 30 tablet 0   primidone  (MYSOLINE) 50 MG tablet Take 0.5 tablets by mouth 2 (two) times daily. FOR HAND TREMORS (Patient not taking: Reported on 11/02/2022)     prochlorperazine (COMPAZINE) 10 MG tablet Take 1 tablet (10 mg total) by mouth every 6 (six) hours as needed (Nausea or vomiting). 30 tablet 1   rivaroxaban (XARELTO) 10 MG TABS tablet Take 1 tablet (10 mg total) by mouth daily. 90 tablet 1   simvastatin (ZOCOR) 40 MG tablet Take 40 mg by mouth daily.     vitamin B-12 (CYANOCOBALAMIN) 1000 MCG tablet Take 1,000 mcg by mouth daily.     No current facility-administered medications for this visit.     PHYSICAL EXAMINATION: ECOG PERFORMANCE STATUS: 1 - Symptomatic but completely ambulatory Vitals:   12/16/22 0838  BP: 110/67  Pulse: 63  Resp: 18  Temp: (!) 96.1 F (35.6 C)  SpO2: 98%   Filed Weights   12/16/22 0838  Weight: 190 lb 4.8 oz (86.3 kg)    Physical Exam Constitutional:      General: He is not in acute distress. HENT:     Head: Normocephalic and atraumatic.     Mouth/Throat:     Comments: Restricted mouth opening/trismus Eyes:     General: No scleral icterus. Cardiovascular:     Rate and Rhythm: Normal rate.  Pulmonary:     Effort: Pulmonary effort is normal. No respiratory distress.     Breath sounds: Normal breath sounds. No wheezing.  Abdominal:     General: Bowel sounds are normal. There is no distension.     Palpations: Abdomen is soft.  Musculoskeletal:        General: No swelling. Normal range of motion.     Cervical back: Normal range of motion and neck supple.  Lymphadenopathy:     Cervical: Cervical adenopathy present.  Skin:    General: Skin is warm and dry.     Findings: No erythema or rash.  Neurological:     Mental Status: He is alert and oriented to person, place, and time. Mental status is at baseline.     Cranial Nerves: No cranial nerve deficit.     Coordination: Coordination normal.  Psychiatric:        Mood and Affect: Mood normal.      LABORATORY  DATA:  I have reviewed the data as listed     Latest Ref Rng & Units 12/09/2022    8:10 AM 11/02/2022    2:50 PM 01/21/2021    1:48 PM  CBC  WBC 4.0 -  10.5 K/uL 9.7  11.1  10.1   Hemoglobin 13.0 - 17.0 g/dL 09.8  11.9  14.7   Hematocrit 39.0 - 52.0 % 45.2  46.1  45.5   Platelets 150 - 400 K/uL 253  283  233       Latest Ref Rng & Units 12/09/2022    8:10 AM 11/02/2022    2:50 PM 01/21/2021    1:48 PM  CMP  Glucose 70 - 99 mg/dL 829  562  130   BUN 8 - 23 mg/dL 15  16  20    Creatinine 0.61 - 1.24 mg/dL 8.65  7.84  6.96   Sodium 135 - 145 mmol/L 139  139  139   Potassium 3.5 - 5.1 mmol/L 4.0  4.2  4.2   Chloride 98 - 111 mmol/L 101  100  101   CO2 22 - 32 mmol/L 29  27  26    Calcium 8.9 - 10.3 mg/dL 9.4  9.1  9.3   Total Protein 6.5 - 8.1 g/dL 7.2  7.3  7.3   Total Bilirubin 0.3 - 1.2 mg/dL 0.7  0.5  0.8   Alkaline Phos 38 - 126 U/L 59  53  45   AST 15 - 41 U/L 20  23  21    ALT 0 - 44 U/L 17  16  20

## 2022-12-17 ENCOUNTER — Other Ambulatory Visit: Payer: Self-pay

## 2022-12-17 ENCOUNTER — Ambulatory Visit
Admission: RE | Admit: 2022-12-17 | Discharge: 2022-12-17 | Disposition: A | Discharge status: Home / Self Care | Payer: Medicare Other | Source: Ambulatory Visit | Attending: Radiation Oncology | Admitting: Radiation Oncology

## 2022-12-17 DIAGNOSIS — C109 Malignant neoplasm of oropharynx, unspecified: Secondary | ICD-10-CM | POA: Diagnosis not present

## 2022-12-17 DIAGNOSIS — G893 Neoplasm related pain (acute) (chronic): Secondary | ICD-10-CM | POA: Diagnosis not present

## 2022-12-17 DIAGNOSIS — Z510 Encounter for antineoplastic radiation therapy: Secondary | ICD-10-CM | POA: Diagnosis not present

## 2022-12-17 DIAGNOSIS — F17220 Nicotine dependence, chewing tobacco, uncomplicated: Secondary | ICD-10-CM | POA: Diagnosis not present

## 2022-12-17 DIAGNOSIS — R1310 Dysphagia, unspecified: Secondary | ICD-10-CM | POA: Diagnosis not present

## 2022-12-17 DIAGNOSIS — Z79899 Other long term (current) drug therapy: Secondary | ICD-10-CM | POA: Diagnosis not present

## 2022-12-17 DIAGNOSIS — Z5111 Encounter for antineoplastic chemotherapy: Secondary | ICD-10-CM | POA: Diagnosis not present

## 2022-12-17 DIAGNOSIS — C029 Malignant neoplasm of tongue, unspecified: Secondary | ICD-10-CM | POA: Diagnosis not present

## 2022-12-17 DIAGNOSIS — Z86718 Personal history of other venous thrombosis and embolism: Secondary | ICD-10-CM | POA: Diagnosis not present

## 2022-12-17 DIAGNOSIS — C770 Secondary and unspecified malignant neoplasm of lymph nodes of head, face and neck: Secondary | ICD-10-CM | POA: Diagnosis not present

## 2022-12-17 DIAGNOSIS — Z7982 Long term (current) use of aspirin: Secondary | ICD-10-CM | POA: Diagnosis not present

## 2022-12-17 LAB — RAD ONC ARIA SESSION SUMMARY
Course Elapsed Days: 8
Plan Fractions Treated to Date: 7
Plan Prescribed Dose Per Fraction: 2 Gy
Plan Total Fractions Prescribed: 35
Plan Total Prescribed Dose: 70 Gy
Reference Point Dosage Given to Date: 14 Gy
Reference Point Session Dosage Given: 2 Gy
Session Number: 7

## 2022-12-20 ENCOUNTER — Ambulatory Visit
Admission: RE | Admit: 2022-12-20 | Discharge: 2022-12-20 | Disposition: A | Discharge status: Home / Self Care | Payer: Medicare Other | Source: Ambulatory Visit | Attending: Radiation Oncology | Admitting: Radiation Oncology

## 2022-12-20 ENCOUNTER — Telehealth: Payer: Self-pay | Admitting: *Deleted

## 2022-12-20 ENCOUNTER — Ambulatory Visit: Payer: Medicare Other | Admitting: Speech Pathology

## 2022-12-20 ENCOUNTER — Other Ambulatory Visit: Payer: Self-pay

## 2022-12-20 DIAGNOSIS — C770 Secondary and unspecified malignant neoplasm of lymph nodes of head, face and neck: Secondary | ICD-10-CM | POA: Diagnosis not present

## 2022-12-20 DIAGNOSIS — R1312 Dysphagia, oropharyngeal phase: Secondary | ICD-10-CM | POA: Diagnosis not present

## 2022-12-20 DIAGNOSIS — C029 Malignant neoplasm of tongue, unspecified: Secondary | ICD-10-CM | POA: Diagnosis not present

## 2022-12-20 DIAGNOSIS — C109 Malignant neoplasm of oropharynx, unspecified: Secondary | ICD-10-CM

## 2022-12-20 DIAGNOSIS — R252 Cramp and spasm: Secondary | ICD-10-CM | POA: Diagnosis not present

## 2022-12-20 DIAGNOSIS — Z5111 Encounter for antineoplastic chemotherapy: Secondary | ICD-10-CM | POA: Diagnosis not present

## 2022-12-20 DIAGNOSIS — Z86718 Personal history of other venous thrombosis and embolism: Secondary | ICD-10-CM | POA: Diagnosis not present

## 2022-12-20 DIAGNOSIS — Z7982 Long term (current) use of aspirin: Secondary | ICD-10-CM | POA: Diagnosis not present

## 2022-12-20 DIAGNOSIS — R471 Dysarthria and anarthria: Secondary | ICD-10-CM | POA: Diagnosis not present

## 2022-12-20 DIAGNOSIS — Z79899 Other long term (current) drug therapy: Secondary | ICD-10-CM | POA: Diagnosis not present

## 2022-12-20 DIAGNOSIS — R1310 Dysphagia, unspecified: Secondary | ICD-10-CM | POA: Diagnosis not present

## 2022-12-20 DIAGNOSIS — Z510 Encounter for antineoplastic radiation therapy: Secondary | ICD-10-CM | POA: Diagnosis not present

## 2022-12-20 DIAGNOSIS — G893 Neoplasm related pain (acute) (chronic): Secondary | ICD-10-CM | POA: Diagnosis not present

## 2022-12-20 DIAGNOSIS — F17220 Nicotine dependence, chewing tobacco, uncomplicated: Secondary | ICD-10-CM | POA: Diagnosis not present

## 2022-12-20 LAB — RAD ONC ARIA SESSION SUMMARY
Course Elapsed Days: 11
Plan Fractions Treated to Date: 8
Plan Prescribed Dose Per Fraction: 2 Gy
Plan Total Fractions Prescribed: 35
Plan Total Prescribed Dose: 70 Gy
Reference Point Dosage Given to Date: 16 Gy
Reference Point Session Dosage Given: 2 Gy
Session Number: 8

## 2022-12-20 NOTE — Telephone Encounter (Signed)
Incoming msg from [employer], NP. Pt saw Happ[facility] today for speech path. Pt is experiencing jaw pain/tongue pain and discussion for potential peg tube placement. Pt thought he was supposed to receive iv fluids tomorrow as well after his radiation. pt would like to be evaluated in [facility].  Per Sharia Reeve, NP pt will need to be scheduled for labs/ possible iv fluids as well.  I reached out to the patient. Pt stated that he is not able to eat/drink. He is interested in a peg tube. Pt would like IV fluids. Pt agreeable to 815 am lab add on apt with smc and possible iv fluids to follow.

## 2022-12-21 ENCOUNTER — Ambulatory Visit: Payer: Medicare Other

## 2022-12-21 ENCOUNTER — Inpatient Hospital Stay (HOSPITAL_BASED_OUTPATIENT_CLINIC_OR_DEPARTMENT_OTHER): Payer: Medicare Other | Admitting: Hospice and Palliative Medicine

## 2022-12-21 ENCOUNTER — Other Ambulatory Visit: Payer: Self-pay

## 2022-12-21 ENCOUNTER — Inpatient Hospital Stay: Payer: Medicare Other

## 2022-12-21 ENCOUNTER — Encounter: Payer: Self-pay | Admitting: Hospice and Palliative Medicine

## 2022-12-21 ENCOUNTER — Ambulatory Visit
Admission: RE | Admit: 2022-12-21 | Discharge: 2022-12-21 | Disposition: A | Discharge status: Home / Self Care | Payer: Medicare Other | Source: Ambulatory Visit | Attending: Radiation Oncology | Admitting: Radiation Oncology

## 2022-12-21 VITALS — BP 104/69 | HR 60 | Temp 97.4°F | Resp 18 | Ht 70.0 in | Wt 188.8 lb

## 2022-12-21 DIAGNOSIS — C029 Malignant neoplasm of tongue, unspecified: Secondary | ICD-10-CM | POA: Diagnosis not present

## 2022-12-21 DIAGNOSIS — C109 Malignant neoplasm of oropharynx, unspecified: Secondary | ICD-10-CM

## 2022-12-21 DIAGNOSIS — R1312 Dysphagia, oropharyngeal phase: Secondary | ICD-10-CM | POA: Insufficient documentation

## 2022-12-21 DIAGNOSIS — C770 Secondary and unspecified malignant neoplasm of lymph nodes of head, face and neck: Secondary | ICD-10-CM | POA: Diagnosis not present

## 2022-12-21 DIAGNOSIS — R6339 Other feeding difficulties: Secondary | ICD-10-CM | POA: Diagnosis not present

## 2022-12-21 DIAGNOSIS — Z510 Encounter for antineoplastic radiation therapy: Secondary | ICD-10-CM | POA: Diagnosis not present

## 2022-12-21 DIAGNOSIS — Z7982 Long term (current) use of aspirin: Secondary | ICD-10-CM | POA: Diagnosis not present

## 2022-12-21 DIAGNOSIS — Z86718 Personal history of other venous thrombosis and embolism: Secondary | ICD-10-CM | POA: Insufficient documentation

## 2022-12-21 DIAGNOSIS — E860 Dehydration: Secondary | ICD-10-CM

## 2022-12-21 DIAGNOSIS — Z79899 Other long term (current) drug therapy: Secondary | ICD-10-CM | POA: Diagnosis not present

## 2022-12-21 DIAGNOSIS — G893 Neoplasm related pain (acute) (chronic): Secondary | ICD-10-CM | POA: Diagnosis not present

## 2022-12-21 DIAGNOSIS — Z5111 Encounter for antineoplastic chemotherapy: Secondary | ICD-10-CM | POA: Diagnosis not present

## 2022-12-21 DIAGNOSIS — R1310 Dysphagia, unspecified: Secondary | ICD-10-CM | POA: Diagnosis not present

## 2022-12-21 DIAGNOSIS — F17220 Nicotine dependence, chewing tobacco, uncomplicated: Secondary | ICD-10-CM | POA: Diagnosis not present

## 2022-12-21 LAB — CBC WITH DIFFERENTIAL (CANCER CENTER ONLY)
Abs Immature Granulocytes: 0.05 10*3/uL (ref 0.00–0.07)
Basophils Absolute: 0.1 10*3/uL (ref 0.0–0.1)
Basophils Relative: 1 %
Eosinophils Absolute: 0.1 10*3/uL (ref 0.0–0.5)
Eosinophils Relative: 1 %
HCT: 43.8 % (ref 39.0–52.0)
Hemoglobin: 15.1 g/dL (ref 13.0–17.0)
Immature Granulocytes: 1 %
Lymphocytes Relative: 18 %
Lymphs Abs: 2 10*3/uL (ref 0.7–4.0)
MCH: 32.3 pg (ref 26.0–34.0)
MCHC: 34.5 g/dL (ref 30.0–36.0)
MCV: 93.8 fL (ref 80.0–100.0)
Monocytes Absolute: 1.2 10*3/uL — ABNORMAL HIGH (ref 0.1–1.0)
Monocytes Relative: 11 %
Neutro Abs: 7.7 10*3/uL (ref 1.7–7.7)
Neutrophils Relative %: 68 %
Platelet Count: 243 10*3/uL (ref 150–400)
RBC: 4.67 MIL/uL (ref 4.22–5.81)
RDW: 11.8 % (ref 11.5–15.5)
WBC Count: 11 10*3/uL — ABNORMAL HIGH (ref 4.0–10.5)
nRBC: 0 % (ref 0.0–0.2)

## 2022-12-21 LAB — CMP (CANCER CENTER ONLY)
ALT: 18 U/L (ref 0–44)
AST: 27 U/L (ref 15–41)
Albumin: 4 g/dL (ref 3.5–5.0)
Alkaline Phosphatase: 58 U/L (ref 38–126)
Anion gap: 9 (ref 5–15)
BUN: 20 mg/dL (ref 8–23)
CO2: 29 mmol/L (ref 22–32)
Calcium: 9.3 mg/dL (ref 8.9–10.3)
Chloride: 97 mmol/L — ABNORMAL LOW (ref 98–111)
Creatinine: 1.01 mg/dL (ref 0.61–1.24)
GFR, Estimated: >60 mL/min (ref >60)
Glucose, Bld: 127 mg/dL — ABNORMAL HIGH (ref 70–99)
Potassium: 5 mmol/L (ref 3.5–5.1)
Sodium: 135 mmol/L (ref 135–145)
Total Bilirubin: 0.7 mg/dL (ref 0.3–1.2)
Total Protein: 7.1 g/dL (ref 6.5–8.1)

## 2022-12-21 LAB — RAD ONC ARIA SESSION SUMMARY
Course Elapsed Days: 12
Plan Fractions Treated to Date: 9
Plan Prescribed Dose Per Fraction: 2 Gy
Plan Total Fractions Prescribed: 35
Plan Total Prescribed Dose: 70 Gy
Reference Point Dosage Given to Date: 18 Gy
Reference Point Session Dosage Given: 2 Gy
Session Number: 9

## 2022-12-21 LAB — MAGNESIUM: Magnesium: 2.2 mg/dL (ref 1.7–2.4)

## 2022-12-21 LAB — PHOSPHORUS: Phosphorus: 3 mg/dL (ref 2.5–4.6)

## 2022-12-21 MED ORDER — SODIUM CHLORIDE 0.9 % IV SOLN
INTRAVENOUS | Status: DC
  Filled 2022-12-21 (×2): qty 250

## 2022-12-21 MED ORDER — SUCRALFATE 1 GM/10ML PO SUSP: 1.0000 g | Freq: Three times a day (TID) | ORAL | 0 refills | Status: DC

## 2022-12-21 MED ORDER — LIDOCAINE VISCOUS HCL 2 % MT SOLN: 15.0000 mL | OROMUCOSAL | 1 refills | Status: DC | PRN

## 2022-12-21 NOTE — Progress Notes (Signed)
Symptom Management Clinic [facility] Telephone:(336) [phone] Fax:(336) [phone]  Patient Care Team: Pcp, No as PCP - General   NAME OF PATIENT: Kenneth Gilmore  [id number]  [birth year]   DATE OF VISIT: 12/21/22  REASON FOR CONSULT: BEAR OSTEN is a 72 y.o. male with multiple medical problems including stage IVa oropharyngeal squamous cell carcinoma, history of DVT on Xarelto.  Patient is on concurrent chemoradiation.  INTERVAL HISTORY: Patient presents [facility] today for evaluation of dysphagia and neoplasm related pain.  He has been evaluated by SLP and nutrition with very minimal oral intake due to pain.  Patient reports that he has pain as he attempts to open his mouth widely in order to eat food.  He has therefore been mostly consuming a liquid diet.  He has tried oral nutritional supplements but is found that those caused nausea and therefore he is mostly drinking clear fluids.  He has oxycodone ordered but says that he is not taking it often.  Denies any neurologic complaints. Denies recent fevers or illnesses. Denies any easy bleeding or bruising. Denies chest pain. Denies any nausea, vomiting, constipation, or diarrhea. Denies urinary complaints. Patient offers no further specific complaints today.   PAST MEDICAL HISTORY: Past Medical History:  Diagnosis Date   Diabetes mellitus without complication (HCC)    Hyperlipidemia    Hypertension     PAST SURGICAL HISTORY:  Past Surgical History:  Procedure Laterality Date   arm surgery Left    CORONARY ANGIOPLASTY WITH STENT PLACEMENT     left side rotator cuff surgery     SHOULDER SURGERY      HEMATOLOGY/ONCOLOGY HISTORY:  Oncology History  Oropharyngeal cancer (HCC)  09/28/2022 Imaging   CT soft tissue neck with contrast showed infiltrating mass in the right tongue with multiple ipsilateral nodal metastases. The primary mass measures at least 4 cm and infiltrates parapharyngeal fat,  visualization of tumor extent is limited by artifact from dental amalgam.   10/28/2022 Initial Diagnosis   Squamous cell carcinoma of tongue (HCC) Patient has throat pain and was evaluated by ENT Dr.Bennett.  Physical examination showed cervical lymphadenopathy.  10/15/2022 ultrasound-guided biopsy of right cervical lymph node showed fibrous tissue, detach minute salivary gland tissue.  Granular debris's and a few dysplastic squamous epithelial cells, nondiagnostic.  10/22/2022 repeat ultrasound-guided biopsy of right cervical lymph node was positive for malignancy, metastatic squamous cell carcinoma, keratinizing, in a background of dense fibrosis and tumor necrosis.   10/28/2022 Cancer Staging   Staging form: Oral Cavity, AJCC 8th Edition - Clinical: Stage IVA (cT3, cN2a, cM0) - Signed by [name], MD on 11/02/2022   11/16/2022 Imaging   PET scan showed 1. Large infiltrating right oropharyngeal mass is markedly hypermetabolic and consistent with known squamous cell carcinoma. 2. Bilateral hypermetabolic metastatic cervical lymphadenopathy. 3. No evidence of metastatic disease involving the chest, abdomen/pelvis or bony structures.     12/09/2022 -  Chemotherapy   Patient is on Treatment Plan : HEAD/NECK Cisplatin (40) q7d       ALLERGIES:  has No Known Allergies.  MEDICATIONS:  Current Outpatient Medications  Medication Sig Dispense Refill   aspirin 81 MG tablet Take 81 mg by mouth daily.     atorvastatin (LIPITOR) 80 MG tablet TAKE ONE-HALF TABLET BY MOUTH AT BEDTIME FOR HIGH CHOLESTEROL REPLACES SIMVASTATIN     Cholecalciferol 25 MCG (1000 UT) tablet Take 1 tablet by mouth daily.     empagliflozin (JARDIANCE) 10 MG TABS tablet  Take by mouth.     gabapentin (NEURONTIN) 300 MG capsule TAKE 1 CAPSULE BY MOUTH TWO TIMES A DAY     lisinopril (PRINIVIL,ZESTRIL) 5 MG tablet Take by mouth. Take 1 tab by mouth once daily     metFORMIN (GLUCOPHAGE) 500 MG tablet Take 500 mg by mouth 2 (two)  times daily.     metoprolol tartrate (LOPRESSOR) 50 MG tablet Take 50 mg by mouth 2 (two) times daily.     nystatin (MYCOSTATIN) 100000 UNIT/ML suspension Take 5 mLs (500,000 Units total) by mouth 4 (four) times daily. Swish and Spit 473 mL 1   ondansetron (ZOFRAN) 8 MG tablet Take 1 tablet (8 mg total) by mouth every 8 (eight) hours as needed for nausea or vomiting. Start on the third day after cisplatin. 30 tablet 1   prochlorperazine (COMPAZINE) 10 MG tablet Take 1 tablet (10 mg total) by mouth every 6 (six) hours as needed (Nausea or vomiting). 30 tablet 1   rivaroxaban (XARELTO) 10 MG TABS tablet Take 1 tablet (10 mg total) by mouth daily. 90 tablet 1   simvastatin (ZOCOR) 40 MG tablet Take 40 mg by mouth daily.     vitamin B-12 (CYANOCOBALAMIN) 1000 MCG tablet Take 1,000 mcg by mouth daily.     dexamethasone (DECADRON) 4 MG tablet Take 2 tablets (8 mg) by mouth daily x 2 days starting the day after cisplatin chemotherapy. Take with food. (Patient not taking: Reported on 12/21/2022) 30 tablet 1   oxyCODONE (OXY IR/ROXICODONE) 5 MG immediate release tablet Take 1 tablet (5 mg total) by mouth every 6 (six) hours as needed for severe pain. (Patient not taking: Reported on 12/21/2022) 30 tablet 0   No current facility-administered medications for this visit.    VITAL SIGNS: BP 104/69   Pulse 60   Temp (!) 97.4 F (36.3 C) (Tympanic)   Resp 18   Ht 5\' 10"  (1.778 m)   Wt 188 lb 12.8 oz (85.6 kg)   SpO2 99%   BMI 27.09 kg/m  Filed Weights   12/21/22 0830  Weight: 188 lb 12.8 oz (85.6 kg)    Estimated body mass index is 27.09 kg/m as calculated from the following:   Height as of this encounter: 5\' 10"  (1.778 m).   Weight as of this encounter: 188 lb 12.8 oz (85.6 kg).  LABS: CBC:    Component Value Date/Time   WBC 11.0 (H) 12/21/2022 0758   WBC 10.1 01/21/2021 1348   HGB 15.1 12/21/2022 0758   HCT 43.8 12/21/2022 0758   PLT 243 12/21/2022 0758   MCV 93.8 12/21/2022 0758    NEUTROABS 7.7 12/21/2022 0758   LYMPHSABS 2.0 12/21/2022 0758   MONOABS 1.2 (H) 12/21/2022 0758   EOSABS 0.1 12/21/2022 0758   BASOSABS 0.1 12/21/2022 0758   Comprehensive Metabolic Panel:    Component Value Date/Time   NA 135 12/21/2022 0758   K 5.0 12/21/2022 0758   CL 97 (L) 12/21/2022 0758   CO2 29 12/21/2022 0758   BUN 20 12/21/2022 0758   CREATININE 1.01 12/21/2022 0758   GLUCOSE 127 (H) 12/21/2022 0758   CALCIUM 9.3 12/21/2022 0758   AST 27 12/21/2022 0758   ALT 18 12/21/2022 0758   ALKPHOS 58 12/21/2022 0758   BILITOT 0.7 12/21/2022 0758   PROT 7.1 12/21/2022 0758   ALBUMIN 4.0 12/21/2022 0758    RADIOGRAPHIC STUDIES: No results found.  PERFORMANCE STATUS (ECOG) : 1 - Symptomatic but completely ambulatory  Review of Systems  Unless otherwise noted, a complete review of systems is negative.  Physical Exam General: NAD Cardiovascular: regular rate and rhythm Pulmonary: clear ant fields Abdomen: soft, nontender, + bowel sounds GU: no suprapubic tenderness Extremities: no edema, no joint deformities Skin: no rashes Neurological: Nonfocal  IMPRESSION/PLAN: Stage IVa oropharyngeal cancer -on concurrent chemoradiation  Neoplasm related pain -encourage patient to utilize his oxycodone as needed.  Could consider starting a long-acting opioid but at this point patient is fairly opioid nave.  Will start on viscous lidocaine and sucralfate.  Weight loss -patient endorses minimal caloric intake.  How long conversation with him today about possible PEG placement.  Patient undecided.  He will speak with nutritionist today.  Proceed with IV fluids today.  Patient will RTC on 5/2 to see Dr. Cathie Hoops.  He will likely benefit from weekly supportive care.   Patient expressed understanding and was in agreement with this plan. He also understands that He can call clinic at any time with any questions, concerns, or complaints.   Thank you for allowing me to participate in the care  of this very pleasant patient.   Time Total: 20 minutes  Visit consisted of counseling and education dealing with the complex and emotionally intense issues of symptom management in the setting of serious illness.Greater than 50%  of this time was spent counseling and coordinating care related to the above assessment and plan.  Signed by: Laurette Schimke, PhD, NP-C

## 2022-12-21 NOTE — Therapy (Signed)
OUTPATIENT SPEECH LANGUAGE PATHOLOGY  TREATMENT NOTE   Patient Name: Kenneth Gilmore MRN: [id number] DOB:12-24-1950, 72 y.o., male Today's Date: 12/21/2022  PCP: No PCP REFERRING PROVIDER: Carmina Miller, MD  END OF SESSION:  End of Session - 12/21/22 0926     Visit Number 2    Number of Visits 17    Date for SLP Re-Evaluation 02/09/23    Authorization Type [employer]    Progress Note Due on Visit 10    SLP Start Time 1200    SLP Stop Time  1230    SLP Time Calculation (min) 30 min    Activity Tolerance Patient tolerated treatment well             Past Medical History:  Diagnosis Date   Diabetes mellitus without complication (HCC)    Hyperlipidemia    Hypertension    Past Surgical History:  Procedure Laterality Date   arm surgery Left    CORONARY ANGIOPLASTY WITH STENT PLACEMENT     left side rotator cuff surgery     SHOULDER SURGERY     Patient Active Problem List   Diagnosis Date Noted   Dysphagia 12/16/2022   Thrush 12/16/2022   Encounter for antineoplastic chemotherapy 12/09/2022   Neoplasm related pain 12/09/2022   Goals of care, counseling/discussion 11/02/2022   History of DVT (deep vein thrombosis) 11/02/2022   Oropharyngeal cancer (HCC) 10/28/2022    ONSET DATE: 10/28/2022 date of diagnosis;  12/14/2022 date of referral  REFERRING DIAG: C10.9 (ICD-10-CM) - Oropharyngeal cancer (HCC)   THERAPY DIAG:  Dysphagia, oropharyngeal phase  Oropharyngeal cancer (HCC)  Rationale for Evaluation and Treatment: Rehabilitation  SUBJECTIVE:   SUBJECTIVE STATEMENT: Pt with flat affect, decreased speech intelligibility, moderate historian Pt accompanied by: self  PERTINENT HISTORY:  Pt is a 72 year old male with past medical history of DVT who is currently undergoing concurrent chemoradiation treatment for at 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   DIAGNOSTIC FINDINGS:  CT Soft tissue neck - 09/30/2022 Infiltrating  mass in the right tongue measuring  up to 4 cm. Prominent submucosal involvement including in the right  parapharyngeal fat. Assessment of relationship to the pterygoids and  extrinsic tongue musculature is somewhat hindered by streak artifact  from dental amalgam.   US biopsy - 10/22/2022 Lymph node, right cervical: Positive for malignancy; Metastatic Squamous Cell Carcinoma, Keratinizing, in a background of dense fibrosis and tumor necrosis An immunohistochemical study directed against p16 is strongly and  diffusely positive, indicating an HPV driven malignancy.   PAIN:  Are you having pain? Yes: NPRS scale: 8/10 Pain location: right tongue and jaw Pain description: pt with difficulty describing Aggravating factors: none Relieving factors: heat  FALLS: Has patient fallen in last 6 months?  No  LIVING ENVIRONMENT: Lives with: lives alone Lives in: House/apartment  PLOF:  Level of assistance: Independent with ADLs, Independent with IADLs Employment: Retired  PATIENT GOALS: to improve mandibular opening  OBJECTIVE:   TODAY'S TREATMENT Skilled treatment session focused on pt's dysphagia, speech intelligibility and trismus goals. SLP facilitated session by providing the following interventions:  Pt stated that he had to stop doing mandibular ROM exercises d/t pain and reduce opening. Pt also states that he is "not able to swallow." Skilled observation provided of pt attempting to swallow with decreased hyolaryngeal excursion palpated with increased pain per patient report.      PATIENT EDUCATION: Education details: effects of radiation on swallow function, malnutrition, alternative means of nutrition, instrumental  swallow study Person educated: Patient Education method: Explanation Education comprehension: verbalized understanding and needs further education  Research states the risk for dysphagia increases due to radiation and/or chemotherapy treatment due to a variety of  factors, so SLP educated the pt about the possibility of reduced/limited ability for PO intake during rad tx. SLP also educated pt regarding possible changes to swallowing musculature after rad tx, and why adherence to dysphagia HEP provided today and PO consumption was necessary to inhibit muscle fibrosis following rad tx and to mitigate muscle disuse atrophy. SLP informed pt why this would be detrimental to their swallowing status and to their pulmonary health. Pt demonstrated understanding of these things to SLP. SLP encouraged pt to safely eat and drink as deep into their radiation/chemotherapy as possible to provide the best possible long-term swallowing outcome for pt.    GOALS: Goals reviewed with patient? Yes  SHORT TERM GOALS: Target date: 10 sessions  Patient will participate in objective swallowing evaluation (MBSS) to identify safest diet recommendation as well as therapeutic targets. Baseline: Goal status: INITIAL  2.  The patient will achieve 90% intelligibility with use of compensatory speech intelligibility strategies while reading aloud "The [place]father Passage" and "The Rainbow Passage" to an unfamiliar listener.  Baseline:  Goal status: INITIAL   LONG TERM GOALS: Target date: 02/09/2023  Pt will be able to verbalize understanding of a home exercise program for pharyngeal, trismus and cervical range of motion.  Baseline:  Goal status: INITIAL  2.  Patient will improve perception of swallowing as indicated by an improvement in EAT-10 score to 5 (Baseline = 10) by 12 weeks from initial swallowing therapy session. Baseline:  Goal status: INITIAL  3.  The patient will increase his maximal jaw opening by 10 mm to achieve enhanced speech intelligibility as evidenced by an improved score on a standardized dysarthria assessment.  Baseline:  Goal status: INITIAL  ASSESSMENT:  CLINICAL IMPRESSION: Pt presents with declining PO intake d/t pain, lingual rawness and decreased  inability to open his mouth. Pt voiced interest in alternative means of nutrition. Secure chat sent to [facility] for symptom management of jaw/lingual pain and to [employer] (RD) regarding pt's interest in alternative means of nutrition. In addition, pt presents with decreased speech intelligibility of ~ 50% at the simple phrase level. Will schedule a Modified Barium Swallow Study.   Data indicate that pt's swallow ability will likely decrease over the course of radiation/chemoradiation therapy and could very well decline over time following the conclusion of that therapy due to muscle disuse atrophy and/or muscle fibrosis. Pt will continue to need to be seen by SLP in order to assess safety of PO intake, assess the need for recommending any objective swallow assessment, and ensuring pt is correctly completing the individualized HEP.   OBJECTIVE IMPAIRMENTS: include dysarthria, dysphagia, and trismus. These impairments are limiting patient from effectively communicating at home and in community and safety when swallowing. Factors affecting potential to achieve goals and functional outcome are medical prognosis and poor health literacy. Patient will benefit from skilled SLP services to address above impairments and improve overall function.  REHAB POTENTIAL: Good  PLAN:  SLP FREQUENCY: 1-2x/week  SLP DURATION: 8 weeks  PLANNED INTERVENTIONS: Aspiration precaution training, Pharyngeal strengthening exercises, Diet toleration management , Trials of upgraded texture/liquids, SLP instruction and feedback, Compensatory strategies, and Patient/family education   Daylah Sayavong B. Dreama Saa, M.S., CCC-SLP, CBIS Speech-Language Pathologist Certified Brain Injury Specialist [place]  [facility] Rehabilitation Services Office  [phone] Ascom [phone] Fax [phone]

## 2022-12-21 NOTE — Progress Notes (Signed)
Nutrition Follow-up:  Patient with oropharyngeal SCC stage IV.  Patient receiving concurrent chemotherapy and radiation.    Met with patient following [facility] visit with NP.  Secure chat received from SLP.  Patient continues to have trouble opening mouth wide enough to eat very much.  Said that he tried ensure but felt nauseated after drinking them.  Did not take nausea medication.  Drinking chocolate milk, juice, water and coffee.  Is able to eat a thin pizza from [employer] pizza a few bites at a time.  Can't really remember what else he is able to eat.    SLP note reviewed   Medications: reviewed  Labs: Phosphorus 3.0, Magnesium 2.2, glucose 127, K 5.0, Na 135  Anthropometrics:   Weight 188 lb 12.8 oz today  190 lb 4.8 oz on 4/25 UBW: 205-210 lb 3-4 months ago  8% weight loss in the last 3-4 months, significant  Estimated Energy Needs  Kcals: 2150-2580 Protein: 108-129 g Fluid: 2150-2580 ml  NUTRITION DIAGNOSIS: Inadequate oral intake ongoing   INTERVENTION:  Patient agreeable to PEG education.  Sample feeding tube shown and patient able to administer water through sample tube.  Written instructions given to patient to review at home.   Reviewed sample feeding regimen with patient.  Explained how patient would receive formula and supplies if decides to have tube placed.   If feeding tube is placed would recommend 7 cartons of isosource 1.5 (divided over 4 feedings).  Flush with 60ml of water before and after each feeding.  Will need additional of water between feedings TID if can't drink this amount orally.  Will provide 2625 calories, 119 g protein, free water Still undecided about feeding tube.  All questions answered.  Will call RD when makes decision.  Has RD contact information Encouraged patient to take nausea medication when feels nauseated.     MONITORING, EVALUATION, GOAL: weight trends, intake   NEXT VISIT: Thursday, May 2 during infusion  Kortland Nichols B. Freida Busman,  RD, LDN Registered Dietitian [phone]

## 2022-12-22 ENCOUNTER — Ambulatory Visit: Payer: Medicare Other | Attending: Radiation Oncology | Admitting: Speech Pathology

## 2022-12-22 ENCOUNTER — Other Ambulatory Visit: Payer: Self-pay

## 2022-12-22 ENCOUNTER — Ambulatory Visit
Admission: RE | Admit: 2022-12-22 | Discharge: 2022-12-22 | Disposition: A | Discharge status: Home / Self Care | Payer: Medicare Other | Source: Ambulatory Visit | Attending: Radiation Oncology | Admitting: Radiation Oncology

## 2022-12-22 DIAGNOSIS — R1310 Dysphagia, unspecified: Secondary | ICD-10-CM | POA: Insufficient documentation

## 2022-12-22 DIAGNOSIS — R252 Cramp and spasm: Secondary | ICD-10-CM | POA: Diagnosis not present

## 2022-12-22 DIAGNOSIS — E43 Unspecified severe protein-calorie malnutrition: Secondary | ICD-10-CM | POA: Insufficient documentation

## 2022-12-22 DIAGNOSIS — R471 Dysarthria and anarthria: Secondary | ICD-10-CM | POA: Insufficient documentation

## 2022-12-22 DIAGNOSIS — F17220 Nicotine dependence, chewing tobacco, uncomplicated: Secondary | ICD-10-CM | POA: Diagnosis not present

## 2022-12-22 DIAGNOSIS — Z7982 Long term (current) use of aspirin: Secondary | ICD-10-CM | POA: Insufficient documentation

## 2022-12-22 DIAGNOSIS — Z5111 Encounter for antineoplastic chemotherapy: Secondary | ICD-10-CM | POA: Insufficient documentation

## 2022-12-22 DIAGNOSIS — C029 Malignant neoplasm of tongue, unspecified: Secondary | ICD-10-CM | POA: Insufficient documentation

## 2022-12-22 DIAGNOSIS — Z79899 Other long term (current) drug therapy: Secondary | ICD-10-CM | POA: Insufficient documentation

## 2022-12-22 DIAGNOSIS — R1312 Dysphagia, oropharyngeal phase: Secondary | ICD-10-CM | POA: Diagnosis not present

## 2022-12-22 DIAGNOSIS — C109 Malignant neoplasm of oropharynx, unspecified: Secondary | ICD-10-CM | POA: Insufficient documentation

## 2022-12-22 DIAGNOSIS — C770 Secondary and unspecified malignant neoplasm of lymph nodes of head, face and neck: Secondary | ICD-10-CM | POA: Insufficient documentation

## 2022-12-22 DIAGNOSIS — G893 Neoplasm related pain (acute) (chronic): Secondary | ICD-10-CM | POA: Insufficient documentation

## 2022-12-22 DIAGNOSIS — Z510 Encounter for antineoplastic radiation therapy: Secondary | ICD-10-CM | POA: Insufficient documentation

## 2022-12-22 DIAGNOSIS — Z86718 Personal history of other venous thrombosis and embolism: Secondary | ICD-10-CM | POA: Insufficient documentation

## 2022-12-22 LAB — RAD ONC ARIA SESSION SUMMARY
Course Elapsed Days: 13
Plan Fractions Treated to Date: 10
Plan Prescribed Dose Per Fraction: 2 Gy
Plan Total Fractions Prescribed: 35
Plan Total Prescribed Dose: 70 Gy
Reference Point Dosage Given to Date: 20 Gy
Reference Point Session Dosage Given: 2 Gy
Session Number: 10

## 2022-12-22 MED FILL — Dexamethasone Sodium Phosphate Inj 100 MG/10ML: INTRAMUSCULAR | Qty: 1 | Status: AC

## 2022-12-22 MED FILL — Fosaprepitant Dimeglumine For IV Infusion 150 MG (Base Eq): INTRAVENOUS | Qty: 5 | Status: AC

## 2022-12-22 NOTE — Therapy (Addendum)
OUTPATIENT SPEECH LANGUAGE PATHOLOGY  TREATMENT NOTE   Patient Name: Kenneth Gilmore MRN: [id number] DOB:[birth year], 72 y.o., male Today's Date: 12/22/2022  PCP: No PCP REFERRING PROVIDER: Carmina Miller, MD  END OF SESSION:  End of Session - 12/22/22 1452     Visit Number 3    Number of Visits 17    Date for SLP Re-Evaluation 02/09/23    Authorization Type [employer]    Progress Note Due on Visit 10    SLP Start Time 1220    SLP Stop Time  1300    SLP Time Calculation (min) 40 min    Activity Tolerance Patient tolerated treatment well             Past Medical History:  Diagnosis Date   Diabetes mellitus without complication (HCC)    Hyperlipidemia    Hypertension    Past Surgical History:  Procedure Laterality Date   arm surgery Left    CORONARY ANGIOPLASTY WITH STENT PLACEMENT     left side rotator cuff surgery     SHOULDER SURGERY     Patient Active Problem List   Diagnosis Date Noted   Dysphagia 12/16/2022   Thrush 12/16/2022   Encounter for antineoplastic chemotherapy 12/09/2022   Neoplasm related pain 12/09/2022   Goals of care, counseling/discussion 11/02/2022   History of DVT (deep vein thrombosis) 11/02/2022   Oropharyngeal cancer (HCC) 10/28/2022    ONSET DATE: 10/28/2022 date of diagnosis;  12/14/2022 date of referral  REFERRING DIAG: C10.9 (ICD-10-CM) - Oropharyngeal cancer (HCC)   THERAPY DIAG:  Dysphagia, oropharyngeal phase  Oropharyngeal cancer (HCC)  Trismus  Dysarthria and anarthria  Rationale for Evaluation and Treatment: Rehabilitation  SUBJECTIVE:   SUBJECTIVE STATEMENT: Pt with flat affect, decreased speech intelligibility, moderate historian Pt accompanied by: self  PERTINENT HISTORY:  Pt is a 72 year old male with past medical history of DVT who is currently undergoing concurrent chemoradiation treatment for at 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   DIAGNOSTIC FINDINGS:  CT Soft  tissue neck - 09/30/2022 Infiltrating mass in the right tongue measuring  up to 4 cm. Prominent submucosal involvement including in the right  parapharyngeal fat. Assessment of relationship to the pterygoids and  extrinsic tongue musculature is somewhat hindered by streak artifact  from dental amalgam.   US biopsy - 10/22/2022 Lymph node, right cervical: Positive for malignancy; Metastatic Squamous Cell Carcinoma, Keratinizing, in a background of dense fibrosis and tumor necrosis An immunohistochemical study directed against p16 is strongly and  diffusely positive, indicating an HPV driven malignancy.   PAIN:  Are you having pain? Yes: NPRS scale: 8/10 Pain location: right tongue and jaw Pain description: pt with difficulty describing Aggravating factors: none Relieving factors: heat  FALLS: Has patient fallen in last 6 months?  No  LIVING ENVIRONMENT: Lives with: lives alone Lives in: House/apartment  PLOF:  Level of assistance: Independent with ADLs, Independent with IADLs Employment: Retired  PATIENT GOALS: to improve mandibular opening  OBJECTIVE:   TODAY'S TREATMENT Skilled treatment session focused on pt's dysphagia, speech intelligibility and trismus goals. SLP facilitated session by providing the following interventions:  Pt arrived to session stating, "I have decided I will force myself to drink a lot of Boost and not get a feeding tube."  SLP reviewing results of recent Modified Barium Swallow Study thru video re-play. Moments of aspiration were identified as well as extensive education provided on high risk of developing aspiration pneumonia. See report from MODIFIED BARIUM  SWALLOW STUDY for further details on risk factors.   At this time, recommend NPO with water protocol following oral care for perseveration of swallow function, intensive pharyngeal and trismus exercises and consideration of alternative means of nutrition.   PATIENT EDUCATION: Education  details: effects of radiation on swallow function, malnutrition, alternative means of nutrition, instrumental swallow study Person educated: Patient Education method: Explanation Education comprehension: verbalized understanding and needs further education  Research states the risk for dysphagia increases due to radiation and/or chemotherapy treatment due to a variety of factors, so SLP educated the pt about the possibility of reduced/limited ability for PO intake during rad tx. SLP also educated pt regarding possible changes to swallowing musculature after rad tx, and why adherence to dysphagia HEP provided today and PO consumption was necessary to inhibit muscle fibrosis following rad tx and to mitigate muscle disuse atrophy. SLP informed pt why this would be detrimental to their swallowing status and to their pulmonary health. Pt demonstrated understanding of these things to SLP. SLP encouraged pt to safely eat and drink as deep into their radiation/chemotherapy as possible to provide the best possible long-term swallowing outcome for pt.    GOALS: Goals reviewed with patient? Yes  SHORT TERM GOALS: Target date: 10 sessions  Patient will participate in objective swallowing evaluation (MBSS) to identify safest diet recommendation as well as therapeutic targets. Baseline: Goal status: INITIAL  2.  The patient will achieve 90% intelligibility with use of compensatory speech intelligibility strategies while reading aloud "The Grandfather Passage" and "The Rainbow Passage" to an unfamiliar listener.  Baseline:  Goal status: INITIAL   LONG TERM GOALS: Target date: 02/09/2023  Pt will be able to verbalize understanding of a home exercise program for pharyngeal, trismus and cervical range of motion.  Baseline:  Goal status: INITIAL  2.  Patient will improve perception of swallowing as indicated by an improvement in EAT-10 score to 5 (Baseline = 10) by 12 weeks from initial swallowing therapy  session. Baseline:  Goal status: INITIAL  3.  The patient will increase his maximal jaw opening by 10 mm to achieve enhanced speech intelligibility as evidenced by an improved score on a standardized dysarthria assessment.  Baseline:  Goal status: INITIAL  ASSESSMENT:  CLINICAL IMPRESSION: Pt with severe oropharyngeal dysphagia resulting in silent aspiration of thin liquids and nectar thick liquids in additional to decreased consumption related to effects of radiation and oropharyngeal cancer. Pt with increased trismus today - 15mm (decreased from 19mm)  Data indicate that pt's swallow ability will likely decrease over the course of radiation/chemoradiation therapy and could very well decline over time following the conclusion of that therapy due to muscle disuse atrophy and/or muscle fibrosis. Pt will continue to need to be seen by SLP in order to assess safety of PO intake, assess the need for recommending any objective swallow assessment, and ensuring pt is correctly completing the individualized HEP.   OBJECTIVE IMPAIRMENTS: include dysarthria, dysphagia, and trismus. These impairments are limiting patient from effectively communicating at home and in community and safety when swallowing. Factors affecting potential to achieve goals and functional outcome are medical prognosis and poor health literacy. Patient will benefit from skilled SLP services to address above impairments and improve overall function.  REHAB POTENTIAL: Good  PLAN:  SLP FREQUENCY: 1-2x/week  SLP DURATION: 8 weeks  PLANNED INTERVENTIONS: Aspiration precaution training, Pharyngeal strengthening exercises, Diet toleration management , Trials of upgraded texture/liquids, SLP instruction and feedback, Compensatory strategies, and Patient/family education   Arby Dahir  Oleh Genin, M.S., CCC-SLP, CBIS Speech-Language Pathologist Certified Brain Injury Specialist [facility] [phone] Ascom [phone] Fax [phone]

## 2022-12-22 NOTE — Procedures (Addendum)
Modified Barium Swallow Study  Patient Details  Name: MUSTAFA POTTS MRN: [id number] Date of Birth: 03-Sep-1950  Today's Date: 12/22/2022  Modified Barium Swallow completed.  Full report located under Chart Review in the Imaging Section.  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 S  oft tissue neck - 09/30/2022  Infiltrating mass in the right tongue measuring   up to 4 cm. Prominent submucosal involvement including in the right   parapharyngeal fat. Assessment of relationship to the pterygoids and  extrinsic tongue musculature is somewhat hindered by streak artifact   from dental amalgam.        US biopsy - 10/22/2022  Lymph node, right cervical:  Positive for malignancy; Metastatic Squamous Cell Carcinoma, Keratinizing, in a background of dense fibrosis and tumor necrosis  An immunohistochemical study directed against p16 is strongly and   diffusely positive, indicating an HPV driven malignancy.   Clinical Impression Pt presents severe oropharyngeal dysphagia when consuming thin liquids via cup, nectar thick liquids via cup and puree.   His dysphagia is multifactorial in nature: severe trismus - trials limited in study d/t inability to open mouth to receive spoon, graham cracker or barium tablet increased pharyngeal weakness (BOT/hyolaryngeal weakness resulting in decreased epiglottic ) from chemoradiation for oropharyngeal cancer tumor burden (see CT Soft Tissue Neck) baseline structural dysphagia d/t the appearance of large osteophytes at C4-C5  As a result, pt presents with silent aspiration of thin liquids and nectar thick liquids and decreased intake of puree with ineffiecent pharyngeal clearing of puree resulting in multiple swallows. At this time, recommend pt be NPO with water protocol following oral care, intensive  trismus and pharyngeal treatment with serious consideration for alternative means of nutrition to prevent further dehydration and malnutrition while undergoing cancer treatments.     Factors that may increase risk of adverse event in presence of aspiration [employer] 2021): Frail or deconditioned;Inadequate oral hygiene;Reduced saliva;Weak cough (orophrayngeal mass, radiation treatment for oropharyngeal mass, weakness/deconditioning d/t weight loss associated with severe trismus; severe trismus)  Swallow Evaluation Recommendations Recommendations: NPO;Free water protocol after oral care;Alternative means of nutrition - G Tube Medication Administration: Via alternative means Supervision: Patient able to self-feed Oral care recommendations: Oral care QID (4x/day) Recommended consults: Consider GI consultation (for G-Tube placement)     Indiah Heyden B. Dreama Saa, M.S., CCC-SLP, Tree surgeon Certified Brain Injury Specialist [facility]  [facility] Services Office [phone] Ascom [phone] Fax [phone]

## 2022-12-23 ENCOUNTER — Inpatient Hospital Stay: Payer: Medicare Other

## 2022-12-23 ENCOUNTER — Other Ambulatory Visit: Payer: Self-pay

## 2022-12-23 ENCOUNTER — Inpatient Hospital Stay: Payer: Medicare Other | Attending: Oncology

## 2022-12-23 ENCOUNTER — Encounter: Payer: Self-pay | Admitting: Oncology

## 2022-12-23 ENCOUNTER — Inpatient Hospital Stay (HOSPITAL_BASED_OUTPATIENT_CLINIC_OR_DEPARTMENT_OTHER): Payer: Medicare Other | Admitting: Oncology

## 2022-12-23 ENCOUNTER — Ambulatory Visit
Admission: RE | Admit: 2022-12-23 | Discharge: 2022-12-23 | Disposition: A | Discharge status: Home / Self Care | Payer: Medicare Other | Source: Ambulatory Visit | Attending: Radiation Oncology | Admitting: Radiation Oncology

## 2022-12-23 ENCOUNTER — Telehealth: Payer: Self-pay | Admitting: Hospice and Palliative Medicine

## 2022-12-23 VITALS — BP 110/69 | HR 70 | Temp 97.0°F | Resp 18 | Wt 186.3 lb

## 2022-12-23 DIAGNOSIS — C109 Malignant neoplasm of oropharynx, unspecified: Secondary | ICD-10-CM | POA: Diagnosis not present

## 2022-12-23 DIAGNOSIS — R1310 Dysphagia, unspecified: Secondary | ICD-10-CM | POA: Diagnosis not present

## 2022-12-23 DIAGNOSIS — G893 Neoplasm related pain (acute) (chronic): Secondary | ICD-10-CM

## 2022-12-23 DIAGNOSIS — Z5111 Encounter for antineoplastic chemotherapy: Secondary | ICD-10-CM | POA: Diagnosis not present

## 2022-12-23 DIAGNOSIS — Z79899 Other long term (current) drug therapy: Secondary | ICD-10-CM | POA: Insufficient documentation

## 2022-12-23 DIAGNOSIS — R252 Cramp and spasm: Secondary | ICD-10-CM | POA: Diagnosis not present

## 2022-12-23 DIAGNOSIS — Z510 Encounter for antineoplastic radiation therapy: Secondary | ICD-10-CM | POA: Diagnosis not present

## 2022-12-23 DIAGNOSIS — Z86718 Personal history of other venous thrombosis and embolism: Secondary | ICD-10-CM

## 2022-12-23 DIAGNOSIS — C770 Secondary and unspecified malignant neoplasm of lymph nodes of head, face and neck: Secondary | ICD-10-CM | POA: Diagnosis not present

## 2022-12-23 DIAGNOSIS — E43 Unspecified severe protein-calorie malnutrition: Secondary | ICD-10-CM | POA: Diagnosis not present

## 2022-12-23 DIAGNOSIS — Z7982 Long term (current) use of aspirin: Secondary | ICD-10-CM | POA: Diagnosis not present

## 2022-12-23 DIAGNOSIS — F17220 Nicotine dependence, chewing tobacco, uncomplicated: Secondary | ICD-10-CM | POA: Diagnosis not present

## 2022-12-23 DIAGNOSIS — B370 Candidal stomatitis: Secondary | ICD-10-CM | POA: Diagnosis not present

## 2022-12-23 LAB — CBC WITH DIFFERENTIAL (CANCER CENTER ONLY)
Abs Immature Granulocytes: 0.16 10*3/uL — ABNORMAL HIGH (ref 0.00–0.07)
Basophils Absolute: 0 10*3/uL (ref 0.0–0.1)
Basophils Relative: 0 %
Eosinophils Absolute: 0.1 10*3/uL (ref 0.0–0.5)
Eosinophils Relative: 1 %
HCT: 43 % (ref 39.0–52.0)
Hemoglobin: 14.9 g/dL (ref 13.0–17.0)
Immature Granulocytes: 1 %
Lymphocytes Relative: 15 %
Lymphs Abs: 1.7 10*3/uL (ref 0.7–4.0)
MCH: 32.3 pg (ref 26.0–34.0)
MCHC: 34.7 g/dL (ref 30.0–36.0)
MCV: 93.1 fL (ref 80.0–100.0)
Monocytes Absolute: 1.1 10*3/uL — ABNORMAL HIGH (ref 0.1–1.0)
Monocytes Relative: 10 %
Neutro Abs: 8.1 10*3/uL — ABNORMAL HIGH (ref 1.7–7.7)
Neutrophils Relative %: 73 %
Platelet Count: 212 10*3/uL (ref 150–400)
RBC: 4.62 MIL/uL (ref 4.22–5.81)
RDW: 11.7 % (ref 11.5–15.5)
WBC Count: 11.1 10*3/uL — ABNORMAL HIGH (ref 4.0–10.5)
nRBC: 0 % (ref 0.0–0.2)

## 2022-12-23 LAB — CMP (CANCER CENTER ONLY)
ALT: 20 U/L (ref 0–44)
AST: 23 U/L (ref 15–41)
Albumin: 4.2 g/dL (ref 3.5–5.0)
Alkaline Phosphatase: 68 U/L (ref 38–126)
Anion gap: 12 (ref 5–15)
BUN: 16 mg/dL (ref 8–23)
CO2: 27 mmol/L (ref 22–32)
Calcium: 9.4 mg/dL (ref 8.9–10.3)
Chloride: 95 mmol/L — ABNORMAL LOW (ref 98–111)
Creatinine: 0.88 mg/dL (ref 0.61–1.24)
GFR, Estimated: >60 mL/min (ref >60)
Glucose, Bld: 148 mg/dL — ABNORMAL HIGH (ref 70–99)
Potassium: 4.5 mmol/L (ref 3.5–5.1)
Sodium: 134 mmol/L — ABNORMAL LOW (ref 135–145)
Total Bilirubin: 0.7 mg/dL (ref 0.3–1.2)
Total Protein: 7.7 g/dL (ref 6.5–8.1)

## 2022-12-23 LAB — RAD ONC ARIA SESSION SUMMARY
Course Elapsed Days: 14
Plan Fractions Treated to Date: 11
Plan Prescribed Dose Per Fraction: 2 Gy
Plan Total Fractions Prescribed: 35
Plan Total Prescribed Dose: 70 Gy
Reference Point Dosage Given to Date: 22 Gy
Reference Point Session Dosage Given: 2 Gy
Session Number: 11

## 2022-12-23 LAB — MAGNESIUM: Magnesium: 2 mg/dL (ref 1.7–2.4)

## 2022-12-23 MED ORDER — POTASSIUM CHLORIDE IN NACL 20-0.9 MEQ/L-% IV SOLN
Freq: Once | INTRAVENOUS | Status: AC
  Filled 2022-12-23: qty 1000

## 2022-12-23 MED ORDER — MAGNESIUM SULFATE 2 GM/50ML IV SOLN
2.0000 g | Freq: Once | INTRAVENOUS | Status: AC
  Administered 2022-12-23: 2 g via INTRAVENOUS
  Filled 2022-12-23: qty 50

## 2022-12-23 MED ORDER — PALONOSETRON HCL INJECTION 0.25 MG/5ML
0.2500 mg | Freq: Once | INTRAVENOUS | Status: AC
  Administered 2022-12-23: 0.25 mg via INTRAVENOUS
  Filled 2022-12-23: qty 5

## 2022-12-23 MED ORDER — SODIUM CHLORIDE 0.9 % IV SOLN
Freq: Once | INTRAVENOUS | Status: AC
  Filled 2022-12-23: qty 250

## 2022-12-23 MED ORDER — SODIUM CHLORIDE 0.9 % IV SOLN
10.0000 mg | Freq: Once | INTRAVENOUS | Status: AC
  Administered 2022-12-23: 10 mg via INTRAVENOUS
  Filled 2022-12-23: qty 10

## 2022-12-23 MED ORDER — SODIUM CHLORIDE 0.9 % IV SOLN
40.0000 mg/m2 | Freq: Once | INTRAVENOUS | Status: AC
  Administered 2022-12-23: 84 mg via INTRAVENOUS
  Filled 2022-12-23: qty 84

## 2022-12-23 MED ORDER — SODIUM CHLORIDE 0.9 % IV SOLN
150.0000 mg | Freq: Once | INTRAVENOUS | Status: AC
  Administered 2022-12-23: 150 mg via INTRAVENOUS
  Filled 2022-12-23: qty 150

## 2022-12-23 NOTE — Progress Notes (Signed)
Nutrition Follow-up:  Patient with oropharyngeal SCC stage IV.  Patient receiving concurrent chemotherapy and radiation.    Met with patient during infusion.  Notes reviewed from SLP regarding results of MBSS with recommendation for NPO and nutrition via alternative means (feeding tube) due to aspiration risk.  Patient in agreement with PEG tube placement per NP and referral sent to GI for evaluation (appt for 5/10).  Patient reports that yesterday only able to take in sips of liquids (juice and water).  Says that when he tries to drink ensure/boost gets nauseated.  Is not taking nausea medication.      Medications: reviewed  Labs: Na 134, glucose 148  Anthropometrics:   Weight 186 lb 4.8 oz today  190 lb 4.8 oz on 4/25 UBW: 205-210 lb 3-4 months ago   NUTRITION DIAGNOSIS: Inadequate oral intake ongoing  INTERVENTION:  Offered to review instructions on G-tube feeding and allow patient to practice with demo tube today but patient declined.   Patient has been set up for fluid support due to poor/limited ability to take po Asked patient again today to take nausea medication.      MONITORING, EVALUATION, GOAL: weight trends, intake   NEXT VISIT: Thursday, May 9th during infusion  Semaya Vida B. Freida Busman, RD, LDN Registered Dietitian [phone]

## 2022-12-23 NOTE — Assessment & Plan Note (Signed)
Recommend nystatin mouth rinse swish and spit.

## 2022-12-23 NOTE — Assessment & Plan Note (Signed)
Chemotherapy plan as listed above 

## 2022-12-23 NOTE — Assessment & Plan Note (Addendum)
[facility] stopped his Xarelto 10 mg. Continue aspirin 81 mg daily for history of CAD status post percutaneous coronary angioplasty.

## 2022-12-23 NOTE — Assessment & Plan Note (Signed)
oxycodone 5mg Q6h PRN pain.   

## 2022-12-23 NOTE — Assessment & Plan Note (Signed)
stage IVA oropharyngeal squamous cell carcinoma, Recommend concurrent chemotherapy with Radiation.  Rationale and side effects were reviewed with patient and he agrees with the plan. Baseline audio testing done.  Labs are reviewed and discussed with patient. Proceed with cisplatin 40mg/m2 today 

## 2022-12-23 NOTE — Telephone Encounter (Signed)
I spoke with patient.  Unfortunately, he is unable to meet his nutritional needs at this point given severe trismus and n.p.o. recommendation after barium swallow study due to ongoing aspiration.  Patient verbalized agreement with PEG.  Will send referral to GI - Dr. Servando Snare.

## 2022-12-23 NOTE — Progress Notes (Signed)
Pt here for follow up. Reports that he has made decision to proceed with PEG tube placement.

## 2022-12-23 NOTE — Assessment & Plan Note (Signed)
Barium swallow indicates aspiration.  Speech swallow therapist recommend NPO and PET placement.  Refer to GI.  Also discussed case with surgery if GI is not able to place PEG

## 2022-12-23 NOTE — Progress Notes (Signed)
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  Rationale and side effects were reviewed with patient and he agrees with the plan. Baseline audio testing done.  Labs are reviewed and discussed with patient. Proceed with cisplatin 40mg /m2 today    Trismus Barium swallow indicates aspiration.  Speech swallow therapist recommend NPO and PET placement.  Refer to GI.  Also discussed case with surgery if GI is not able to place PEG  Neoplasm related pain  oxycodone 5mg  Q6h PRN pain.    Encounter for antineoplastic chemotherapy Chemotherapy plan as listed above  History of DVT (deep vein thrombosis) [facility] stopped his Xarelto 10 mg. Continue aspirin 81 mg daily for history of CAD status post percutaneous coronary angioplasty.  Thrush Recommend nystatin mouth rinse swish and spit.    Orders Placed This Encounter  Procedures   CMP (Cancer Center only)    Standing Status:   Future    Standing Expiration Date:   01/06/2024   CBC with Differential (Cancer Center Only)    Standing Status:   Future    Standing Expiration Date:   01/06/2024   Magnesium    Standing Status:   Future    Standing Expiration Date:   01/06/2024   CMP (Cancer Center only)    Standing Status:   Future    Standing Expiration Date:   01/13/2024   CBC with Differential (Cancer Center Only)    Standing Status:   Future    Standing Expiration Date:   01/13/2024   Magnesium    Standing Status:   Future    Standing Expiration Date:   01/13/2024   CMP (Cancer Center only)    Standing  Status:   Future    Standing Expiration Date:   01/20/2024   CBC with Differential (Cancer Center Only)    Standing Status:   Future    Standing Expiration Date:   01/20/2024   Magnesium    Standing Status:   Future    Standing Expiration Date:   01/20/2024   Follow-up 1 week lab MD cisplatin.  All questions were answered. The patient knows to call the clinic with any problems, questions or concerns.  [name], MD, PhD [facility] Hematology Oncology 12/23/2022   HISTORY OF PRESENTING ILLNESS:  Patient presented outpatient ultrasound of his left lower extremity showing positive for DVT. Denies any recent,, surgery, travel or issue.  Denies any family history of DVT.  Reports that he developed left lower extremity swelling for a week, associated with moderate severity throbbing aching discomfort feels like pulling a muscle.  Denies any shortness of breath. Patient was started on Xarelto starting kit and advised to follow-up with heme-onc.  he take Xarelto 10mg  daily. Also on Aspirin 81mg  daily. Tolerates well.Denies hematochezia, hematuria, hematemesis, epistaxis, black tarry stool or easy bruising.   INTERVAL HISTORY Kenneth Gilmore is a 72 y.o. male presents to re-establish care for oropharyngeal squamous cell carcinoma  Oncology History  Oropharyngeal cancer (HCC)  09/28/2022 Imaging   CT soft tissue neck with contrast showed infiltrating mass in the right tongue with multiple ipsilateral nodal metastases.  The primary mass measures at least 4 cm and infiltrates parapharyngeal fat, visualization of tumor extent is limited by artifact from dental amalgam.   10/28/2022 Initial Diagnosis   Squamous cell carcinoma of tongue (HCC) Patient has throat pain and was evaluated by ENT Dr.Bennett.  Physical examination showed cervical lymphadenopathy.  10/15/2022 ultrasound-guided biopsy of right cervical lymph node showed fibrous tissue, detach minute salivary gland tissue.  Granular debris's and a  few dysplastic squamous epithelial cells, nondiagnostic.  10/22/2022 repeat ultrasound-guided biopsy of right cervical lymph node was positive for malignancy, metastatic squamous cell carcinoma, keratinizing, in a background of dense fibrosis and tumor necrosis.   10/28/2022 Cancer Staging   Staging form: Oral Cavity, AJCC 8th Edition - Clinical: Stage IVA (cT3, cN2a, cM0) - Signed by [name], MD on 11/02/2022   11/16/2022 Imaging   PET scan showed 1. Large infiltrating right oropharyngeal mass is markedly hypermetabolic and consistent with known squamous cell carcinoma. 2. Bilateral hypermetabolic metastatic cervical lymphadenopathy. 3. No evidence of metastatic disease involving the chest, abdomen/pelvis or bony structures.     12/09/2022 -  Chemotherapy   Patient is on Treatment Plan : HEAD/NECK Cisplatin (40) q7d     He uses dipping tobacco and tries to quit. He reports mild swallowing difficulty.  Right ear pain, right ear sharp pain is intermittent, pian is better after taking oxycodone.  Tongue pain as well.  He is off Xarelto 10mg  per recommendation of his [place] provider.     Review of Systems  Constitutional:  Negative for chills, fever, malaise/fatigue and weight loss.  HENT:  Negative for sore throat.        Sore throat, right ear pain  Eyes:  Negative for redness.  Respiratory:  Negative for cough, shortness of breath and wheezing.   Cardiovascular:  Negative for chest pain and palpitations.       Trace edema bilaterally  Gastrointestinal:  Negative for abdominal pain, blood in stool, nausea and vomiting.  Genitourinary:  Negative for dysuria.  Musculoskeletal:  Negative for myalgias.  Skin:  Negative for rash.  Neurological:  Negative for dizziness, tingling and tremors.  Endo/Heme/Allergies:  Does not bruise/bleed easily.  Psychiatric/Behavioral:  Negative for hallucinations.     MEDICAL HISTORY:  Past Medical History:  Diagnosis Date   Diabetes mellitus without  complication (HCC)    Hyperlipidemia    Hypertension     SURGICAL HISTORY: Past Surgical History:  Procedure Laterality Date   arm surgery Left    CORONARY ANGIOPLASTY WITH STENT PLACEMENT     left side rotator cuff surgery     SHOULDER SURGERY      SOCIAL HISTORY: Social History   Socioeconomic History   Marital status: Single    Spouse name: Not on file   Number of children: Not on file   Years of education: Not on file   Highest education level: Not on file  Occupational History   Occupation: retired  Tobacco Use   Smoking status: Never   Smokeless tobacco: Current    Types: Chew  Substance and Sexual Activity   Alcohol use: No   Drug use: Never   Sexual activity: Not on file  Other Topics Concern   Not on file  Social History Narrative   Not on file   Social Determinants of Health   Financial Resource Strain: Not on file  Food Insecurity: Not on file  Transportation Needs: Not on file  Physical Activity: Not on file  Stress: Not on file  Social Connections: Not on file  Intimate Partner Violence: Not on file    FAMILY HISTORY: Family History  Problem Relation Age of Onset   Cancer Father        lung    ALLERGIES:  has No Known Allergies.  MEDICATIONS:  Current Outpatient Medications  Medication Sig Dispense Refill   aspirin 81 MG tablet Take 81 mg by mouth daily.     atorvastatin (LIPITOR) 80 MG tablet TAKE ONE-HALF TABLET BY MOUTH AT BEDTIME FOR HIGH CHOLESTEROL REPLACES SIMVASTATIN     Cholecalciferol 25 MCG (1000 UT) tablet Take 1 tablet by mouth daily.     dexamethasone (DECADRON) 4 MG tablet Take 2 tablets (8 mg) by mouth daily x 2 days starting the day after cisplatin chemotherapy. Take with food. 30 tablet 1   empagliflozin (JARDIANCE) 10 MG TABS tablet Take by mouth.     gabapentin (NEURONTIN) 300 MG capsule TAKE 1 CAPSULE BY MOUTH TWO TIMES A DAY     metFORMIN (GLUCOPHAGE) 500 MG tablet Take 500 mg by mouth 2 (two) times daily.      metoprolol tartrate (LOPRESSOR) 50 MG tablet Take 50 mg by mouth 2 (two) times daily.     ondansetron (ZOFRAN) 8 MG tablet Take 1 tablet (8 mg total) by mouth every 8 (eight) hours as needed for nausea or vomiting. Start on the third day after cisplatin. 30 tablet 1   oxyCODONE (OXY IR/ROXICODONE) 5 MG immediate release tablet Take 1 tablet (5 mg total) by mouth every 6 (six) hours as needed for severe pain. 30 tablet 0   prochlorperazine (COMPAZINE) 10 MG tablet Take 1 tablet (10 mg total) by mouth every 6 (six) hours as needed (Nausea or vomiting). 30 tablet 1   vitamin B-12 (CYANOCOBALAMIN) 1000 MCG tablet Take 1,000 mcg by mouth daily.     lidocaine (XYLOCAINE) 2 % solution Use as directed 15 mLs in the mouth or throat as needed for mouth pain. (Patient not taking: Reported on 12/23/2022) 250 mL 1   lisinopril (PRINIVIL,ZESTRIL) 5 MG tablet Take by mouth. Take 1 tab by mouth once daily (Patient not taking: Reported on 12/23/2022)     nystatin (MYCOSTATIN) 100000 UNIT/ML suspension Take 5 mLs (500,000 Units total) by mouth 4 (four) times daily. Swish and Spit (Patient not taking: Reported on 12/23/2022) 473 mL 1   rivaroxaban (XARELTO) 10 MG TABS tablet Take 1 tablet (10 mg total) by mouth daily. (Patient not taking: Reported on 12/23/2022) 90 tablet 1   simvastatin (ZOCOR) 40 MG tablet Take 40 mg by mouth daily. (Patient not taking: Reported on 12/23/2022)     sucralfate (CARAFATE) 1 GM/10ML suspension Take 10 mLs (1 g total) by mouth 4 (four) times daily -  with meals and at bedtime. (Patient not taking: Reported on 12/23/2022) 420 mL 0   No current facility-administered medications for this visit.     PHYSICAL EXAMINATION: ECOG PERFORMANCE STATUS: 1 - Symptomatic but completely ambulatory Vitals:   12/23/22 0854  BP: 110/69  Pulse: 70  Resp: 18  Temp: (!) 97 F (36.1 C)   Filed Weights   12/23/22 0854  Weight: 186 lb 4.8 oz (84.5 kg)    Physical Exam Constitutional:      General: He is not  in acute distress. HENT:     Head: Normocephalic and atraumatic.     Mouth/Throat:     Comments: Restricted mouth opening/trismus Thrush Eyes:     General: No scleral icterus. Cardiovascular:  Rate and Rhythm: Normal rate.  Pulmonary:     Effort: Pulmonary effort is normal. No respiratory distress.     Breath sounds: Normal breath sounds. No wheezing.  Abdominal:     General: Bowel sounds are normal. There is no distension.     Palpations: Abdomen is soft.  Musculoskeletal:        General: No swelling. Normal range of motion.     Cervical back: Normal range of motion and neck supple.  Lymphadenopathy:     Cervical: Cervical adenopathy present.  Skin:    General: Skin is warm and dry.     Findings: No erythema or rash.  Neurological:     Mental Status: He is alert and oriented to person, place, and time. Mental status is at baseline.     Cranial Nerves: No cranial nerve deficit.     Coordination: Coordination normal.  Psychiatric:        Mood and Affect: Mood normal.      LABORATORY DATA:  I have reviewed the data as listed     Latest Ref Rng & Units 12/23/2022    8:01 AM 12/21/2022    7:58 AM 12/16/2022    8:08 AM  CBC  WBC 4.0 - 10.5 K/uL 11.1  11.0  11.4   Hemoglobin 13.0 - 17.0 g/dL 56.2  13.0  86.5   Hematocrit 39.0 - 52.0 % 43.0  43.8  45.6   Platelets 150 - 400 K/uL 212  243  273       Latest Ref Rng & Units 12/23/2022    8:01 AM 12/21/2022    7:58 AM 12/16/2022    8:08 AM  CMP  Glucose 70 - 99 mg/dL 784  696  295   BUN 8 - 23 mg/dL 16  20  16    Creatinine 0.61 - 1.24 mg/dL 2.84  1.32  4.40   Sodium 135 - 145 mmol/L 134  135  134   Potassium 3.5 - 5.1 mmol/L 4.5  5.0  4.1   Chloride 98 - 111 mmol/L 95  97  97   CO2 22 - 32 mmol/L 27  29  26    Calcium 8.9 - 10.3 mg/dL 9.4  9.3  9.3   Total Protein 6.5 - 8.1 g/dL 7.7  7.1  7.5   Total Bilirubin 0.3 - 1.2 mg/dL 0.7  0.7  0.7   Alkaline Phos 38 - 126 U/L 68  58  58   AST 15 - 41 U/L 23  27  23    ALT 0 -  44 U/L 20  18  20

## 2022-12-24 ENCOUNTER — Inpatient Hospital Stay: Payer: Medicare Other

## 2022-12-24 ENCOUNTER — Ambulatory Visit
Admission: RE | Admit: 2022-12-24 | Discharge: 2022-12-24 | Disposition: A | Discharge status: Home / Self Care | Payer: Medicare Other | Source: Ambulatory Visit | Attending: Radiation Oncology | Admitting: Radiation Oncology

## 2022-12-24 ENCOUNTER — Other Ambulatory Visit: Payer: Self-pay

## 2022-12-24 VITALS — BP 123/73 | HR 63 | Temp 97.0°F

## 2022-12-24 DIAGNOSIS — Z5111 Encounter for antineoplastic chemotherapy: Secondary | ICD-10-CM | POA: Diagnosis not present

## 2022-12-24 DIAGNOSIS — Z7689 Persons encountering health services in other specified circumstances: Secondary | ICD-10-CM

## 2022-12-24 DIAGNOSIS — Z7982 Long term (current) use of aspirin: Secondary | ICD-10-CM | POA: Diagnosis not present

## 2022-12-24 DIAGNOSIS — F17220 Nicotine dependence, chewing tobacco, uncomplicated: Secondary | ICD-10-CM | POA: Diagnosis not present

## 2022-12-24 DIAGNOSIS — G893 Neoplasm related pain (acute) (chronic): Secondary | ICD-10-CM | POA: Diagnosis not present

## 2022-12-24 DIAGNOSIS — C770 Secondary and unspecified malignant neoplasm of lymph nodes of head, face and neck: Secondary | ICD-10-CM | POA: Diagnosis not present

## 2022-12-24 DIAGNOSIS — R1310 Dysphagia, unspecified: Secondary | ICD-10-CM | POA: Diagnosis not present

## 2022-12-24 DIAGNOSIS — Z79899 Other long term (current) drug therapy: Secondary | ICD-10-CM | POA: Diagnosis not present

## 2022-12-24 DIAGNOSIS — C109 Malignant neoplasm of oropharynx, unspecified: Secondary | ICD-10-CM | POA: Diagnosis not present

## 2022-12-24 DIAGNOSIS — E860 Dehydration: Secondary | ICD-10-CM

## 2022-12-24 DIAGNOSIS — Z510 Encounter for antineoplastic radiation therapy: Secondary | ICD-10-CM | POA: Diagnosis not present

## 2022-12-24 DIAGNOSIS — Z86718 Personal history of other venous thrombosis and embolism: Secondary | ICD-10-CM | POA: Diagnosis not present

## 2022-12-24 DIAGNOSIS — E43 Unspecified severe protein-calorie malnutrition: Secondary | ICD-10-CM | POA: Diagnosis not present

## 2022-12-24 DIAGNOSIS — R110 Nausea: Secondary | ICD-10-CM

## 2022-12-24 LAB — RAD ONC ARIA SESSION SUMMARY
Course Elapsed Days: 15
Plan Fractions Treated to Date: 12
Plan Prescribed Dose Per Fraction: 2 Gy
Plan Total Fractions Prescribed: 35
Plan Total Prescribed Dose: 70 Gy
Reference Point Dosage Given to Date: 24 Gy
Reference Point Session Dosage Given: 2 Gy
Session Number: 12

## 2022-12-24 MED ORDER — SODIUM CHLORIDE 0.9 % IV SOLN
INTRAVENOUS | Status: DC
  Filled 2022-12-24 (×2): qty 250

## 2022-12-24 MED ORDER — ONDANSETRON HCL 4 MG/2ML IJ SOLN
8.0000 mg | Freq: Once | INTRAMUSCULAR | Status: AC
  Administered 2022-12-24: 8 mg via INTRAVENOUS
  Filled 2022-12-24: qty 4

## 2022-12-24 NOTE — Progress Notes (Signed)
RN spoke with GI. Apt moved from 5/10 to Monday 5/6 at 1115 with Dr. Servando Snare [facility]). Pt will discuss tube feeding placement with GI.  Radiation apts moved to 230 pm on Monday. I spoke with patient and personally provided him with these apts. AVS printed. Pt aware of apt date/time changes and agreeable to plan of care. Pt seems very overwhelmed at any change of apts. He is "taking one day at a time." He stated that he is still "feeling reluctant to get a feeding tube and was hoping this would be the last resort." He is still willing to talk to GI and discuss the placement process and will make a decision about the feeding tube placement s/p GI visit.

## 2022-12-24 NOTE — Progress Notes (Signed)
Social worker referral entered - Poor coping. On active treatment. Highly anxious./overwhelmed with the ideal of having a feeding tube and multiple apts.  Lives by himself; He has sisters that will occasionally check in. He may benefit from support group.community resource.

## 2022-12-27 ENCOUNTER — Encounter: Payer: Self-pay | Admitting: Gastroenterology

## 2022-12-27 ENCOUNTER — Ambulatory Visit: Payer: Medicare Other | Admitting: Gastroenterology

## 2022-12-27 ENCOUNTER — Other Ambulatory Visit: Payer: Self-pay

## 2022-12-27 ENCOUNTER — Telehealth: Payer: Self-pay

## 2022-12-27 ENCOUNTER — Inpatient Hospital Stay: Payer: Medicare Other

## 2022-12-27 ENCOUNTER — Ambulatory Visit
Admission: RE | Admit: 2022-12-27 | Discharge: 2022-12-27 | Disposition: A | Discharge status: Home / Self Care | Payer: Medicare Other | Source: Ambulatory Visit | Attending: Radiation Oncology | Admitting: Radiation Oncology

## 2022-12-27 VITALS — BP 131/81 | HR 71 | Temp 97.6°F | Ht 69.0 in | Wt 183.0 lb

## 2022-12-27 DIAGNOSIS — R1312 Dysphagia, oropharyngeal phase: Secondary | ICD-10-CM

## 2022-12-27 DIAGNOSIS — Z510 Encounter for antineoplastic radiation therapy: Secondary | ICD-10-CM | POA: Diagnosis not present

## 2022-12-27 DIAGNOSIS — R1310 Dysphagia, unspecified: Secondary | ICD-10-CM

## 2022-12-27 DIAGNOSIS — Z86718 Personal history of other venous thrombosis and embolism: Secondary | ICD-10-CM | POA: Diagnosis not present

## 2022-12-27 DIAGNOSIS — C109 Malignant neoplasm of oropharynx, unspecified: Secondary | ICD-10-CM | POA: Diagnosis not present

## 2022-12-27 DIAGNOSIS — F17220 Nicotine dependence, chewing tobacco, uncomplicated: Secondary | ICD-10-CM | POA: Diagnosis not present

## 2022-12-27 DIAGNOSIS — Z79899 Other long term (current) drug therapy: Secondary | ICD-10-CM | POA: Diagnosis not present

## 2022-12-27 DIAGNOSIS — Z7982 Long term (current) use of aspirin: Secondary | ICD-10-CM | POA: Diagnosis not present

## 2022-12-27 DIAGNOSIS — Z5111 Encounter for antineoplastic chemotherapy: Secondary | ICD-10-CM | POA: Diagnosis not present

## 2022-12-27 DIAGNOSIS — C770 Secondary and unspecified malignant neoplasm of lymph nodes of head, face and neck: Secondary | ICD-10-CM | POA: Diagnosis not present

## 2022-12-27 DIAGNOSIS — E43 Unspecified severe protein-calorie malnutrition: Secondary | ICD-10-CM | POA: Diagnosis not present

## 2022-12-27 DIAGNOSIS — G893 Neoplasm related pain (acute) (chronic): Secondary | ICD-10-CM | POA: Diagnosis not present

## 2022-12-27 LAB — RAD ONC ARIA SESSION SUMMARY
Course Elapsed Days: 18
Plan Fractions Treated to Date: 13
Plan Prescribed Dose Per Fraction: 2 Gy
Plan Total Fractions Prescribed: 35
Plan Total Prescribed Dose: 70 Gy
Reference Point Dosage Given to Date: 26 Gy
Reference Point Session Dosage Given: 2 Gy
Session Number: 13

## 2022-12-27 NOTE — Addendum Note (Signed)
Addended by: Roena Malady on: 12/27/2022 03:07 PM   Modules accepted: Orders

## 2022-12-27 NOTE — Telephone Encounter (Signed)
CSW received referral from RN to assess psychosocial needs.  Left vm.

## 2022-12-27 NOTE — Progress Notes (Signed)
Gastroenterology Consultation  Referring Provider:     [facility] Primary Care Physician:  [facility] Primary Gastroenterologist:  Kenneth Gilmore     Reason for Consultation:     Dysphagia        HPI:   Kenneth Gilmore is a 72 y.o. y/o male referred for consultation & management of dysphagia by Dr. Eli Gilmore, [facility].  This patient was set up for an urgent appointment due to weight loss and dysphagia after having been diagnosed with stage IV oropharyngeal squamous cell carcinoma being treated with chemotherapy with radiation.  The patient has not been able to open his mouth wide enough to take in good nutrition.  The patient was recommended to undergo a PEG tube placement.  The patient now is being set up with me for evaluation for PEG tube placement since he is unable to open up his mouth widely. The patient reports that if there was another way he would rather proceed with out having a feeding tube but acknowledges that all of his providers have recommended this because of his inability to keep up his nutrition.  Past Medical History:  Diagnosis Date   Diabetes mellitus without complication (HCC)    Hyperlipidemia    Hypertension     Past Surgical History:  Procedure Laterality Date   arm surgery Left    CORONARY ANGIOPLASTY WITH STENT PLACEMENT     left side rotator cuff surgery     SHOULDER SURGERY      Prior to Admission medications   Medication Sig Start Date End Date Taking? Authorizing Provider  aspirin 81 MG tablet Take 81 mg by mouth daily.   Yes [provider]  atorvastatin (LIPITOR) 80 MG tablet TAKE ONE-HALF TABLET BY MOUTH AT BEDTIME FOR HIGH CHOLESTEROL REPLACES SIMVASTATIN 04/14/22  Yes [provider]  Cholecalciferol 25 MCG (1000 UT) tablet Take 1 tablet by mouth daily. 09/06/20  Yes [provider]  dexamethasone (DECADRON) 4 MG tablet Take 2 tablets (8 mg) by mouth daily x 2 days starting the day after  cisplatin chemotherapy. Take with food. 11/16/22  Yes [name], MD  empagliflozin (JARDIANCE) 10 MG TABS tablet Take by mouth.   Yes [provider]  gabapentin (NEURONTIN) 300 MG capsule TAKE 1 CAPSULE BY MOUTH TWO TIMES A DAY 11/22/21  Yes [provider]  lisinopril (PRINIVIL,ZESTRIL) 5 MG tablet Take by mouth. Take 1 tab by mouth once daily 03/15/17  Yes [provider]  metFORMIN (GLUCOPHAGE) 500 MG tablet Take 500 mg by mouth 2 (two) times daily.   Yes [provider]  metoprolol tartrate (LOPRESSOR) 50 MG tablet Take 50 mg by mouth 2 (two) times daily. 03/15/17  Yes [provider]  nystatin (MYCOSTATIN) 100000 UNIT/ML suspension Take 5 mLs (500,000 Units total) by mouth 4 (four) times daily. Swish and Spit 12/16/22  Yes [name], MD  ondansetron (ZOFRAN) 8 MG tablet Take 1 tablet (8 mg total) by mouth every 8 (eight) hours as needed for nausea or vomiting. Start on the third day after cisplatin. 11/16/22  Yes [name], MD  oxyCODONE (OXY IR/ROXICODONE) 5 MG immediate release tablet Take 1 tablet (5 mg total) by mouth every 6 (six) hours as needed for severe pain. 12/09/22  Yes [name], MD  prochlorperazine (COMPAZINE) 10 MG tablet Take 1 tablet (10 mg total) by mouth every 6 (six) hours as needed (Nausea or vomiting). 11/16/22  Yes [name], MD  simvastatin (ZOCOR) 40 MG tablet Take 40 mg by mouth daily. 03/15/17  Yes [provider]  sucralfate (CARAFATE) 1 GM/10ML suspension Take 10 mLs (1 g total) by mouth 4 (four) times daily -  with meals and at bedtime. 12/21/22  Yes Borders, Daryl Eastern, NP  vitamin B-12 (CYANOCOBALAMIN) 1000 MCG tablet Take 1,000 mcg by mouth daily.   Yes [provider]    Family History  Problem Relation Age of Onset   Cancer Father        lung     Social History   Tobacco Use   Smoking status: Never   Smokeless tobacco: Current    Types: Chew  Substance Use Topics   Alcohol use: No   Drug use: Never     Allergies as of 12/27/2022   (No Known Allergies)    Review of Systems:    All systems reviewed and negative except where noted in HPI.   Physical Exam:  BP 131/81 (BP Location: Right Arm, Patient Position: Sitting, Cuff Size: Normal)   Pulse 71   Temp 97.6 F (36.4 C) (Oral)   Ht 5\' 9"  (1.753 m)   Wt 183 lb (83 kg)   BMI 27.02 kg/m  No LMP for male patient. General:   Alert,  Well-developed, well-nourished, pleasant and cooperative in NAD Head:  Normocephalic and atraumatic. Eyes:  Sclera clear, no icterus.   Conjunctiva pink. Ears:  Normal auditory acuity. Neck:  Supple; no masses or thyromegaly. Lungs:  Respirations even and unlabored.  Clear throughout to auscultation.   No wheezes, crackles, or rhonchi. No acute distress. Heart:  Regular rate and rhythm; no murmurs, clicks, rubs, or gallops. Abdomen:  Normal bowel sounds.  No bruits.  Soft, non-tender and non-distended without masses, hepatosplenomegaly or hernias noted.  No guarding or rebound tenderness.  Negative Carnett sign.   Rectal:  Deferred.  Pulses:  Normal pulses noted. Extremities:  No clubbing or edema.  No cyanosis. Neurologic:  Alert and oriented x3;  grossly normal neurologically. Skin:  Intact without significant lesions or rashes.  No jaundice. Lymph Nodes:  No significant cervical adenopathy. Psych:  Alert and cooperative. Normal mood and affect.  Imaging Studies: DG [name] OP MEDICARE SPEECH PATH  Result Date: 12/22/2022 Table formatting from the original result was not included. Modified Barium Swallow Study Patient Details Name: Kenneth Gilmore MRN: [id number] Date of Birth: Sep 10, 1950 Today's Date: 12/22/2022 HPI/PMH: HPI: Pt is a 72 year old male with past medical history of DVT who is currently undergoing concurrent chemoradiation treatment for at 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 Soft tissue neck - 09/30/2022  Infiltrating mass in the right tongue measuring   up to 4 cm.  Prominent submucosal involvement including in the right   parapharyngeal fat. Assessment of relationship to the pterygoids and   extrinsic tongue musculature is somewhat hindered by streak artifact   from dental amalgam.      US biopsy - 10/22/2022  Lymph node, right cervical:  Positive for malignancy; Metastatic Squamous Cell Carcinoma, Keratinizing, in a background of dense fibrosis and tumor necrosis  An immunohistochemical study directed against p16 is strongly and   diffusely positive, indicating an HPV driven malignancy. Clinical Impression: Pt presents severe oropharyngeal dysphagia when consuming thin liquids via cup, nectar thick liquids via cup and puree.      His dysphagia is multifactorial in nature:  severe trismus - trials limited in study d/t inability to open mouth to receive spoon, graham  cracker or barium tablet  increased pharyngeal weakness (BOT/hyolaryngeal weakness resulting in decreased epiglottic ) from chemoradiation for oropharyngeal cancer  tumor burden (see CT Soft Tissue Neck)  baseline structural dysphagia d/t the appearance of large osteophytes at C4-C5     As a result, pt presents with silent aspiration of thin liquids and nectar thick liquids and decreased intake of puree with ineffiecent pharyngeal clearing of puree resulting in multiple swallows. At this time, recommend pt be NPO with water protocol following oral care, intensive trismus and pharyngeal treatment with serious consideration for alternative means of nutrition to prevent further dehydration and malnutrition while undergoing cancer treatments. Factors that may increase risk of adverse event in presence of aspiration Rubye Oaks & Clearance Coots 2021): Factors that may increase risk of adverse event in presence of aspiration Rubye Oaks & Clearance Coots 2021): Frail or deconditioned; Inadequate oral hygiene; Reduced saliva; Weak cough (orophrayngeal mass, radiation treatment for oropharyngeal mass, weakness/deconditioning d/t weight loss  associated with severe trismus; severe trismus) Recommendations/Plan: Swallowing Evaluation Recommendations Swallowing Evaluation Recommendations Recommendations: NPO; Free water protocol after oral care; Alternative means of nutrition - G Tube Medication Administration: Via alternative means Supervision: Patient able to self-feed Oral care recommendations: Oral care QID (4x/day) Recommended consults: Consider GI consultation (for G-Tube placement) Treatment Plan Treatment Plan Treatment recommendations: Other (comment) (continue Outpatient ST services) Follow-up recommendations: Outpatient SLP Functional status assessment: Patient has had a recent decline in their functional status and demonstrates the ability to make significant improvements in function in a reasonable and predictable amount of time. Recommendations Recommendations for follow up therapy are one component of a multi-disciplinary discharge planning process, led by the attending physician.  Recommendations may be updated based on patient status, additional functional criteria and insurance authorization. Assessment: Orofacial Exam: Orofacial Exam Oral Cavity: Oral Hygiene: WFL Oral Cavity - Dentition: Adequate natural dentition Orofacial Anatomy: WFL Anatomy: Anatomy: -- (Large appearing osteophytes at C4-C5) Boluses Administered: Boluses Administered Boluses Administered: Thin liquids (Level 0); Mildly thick liquids (Level 2, nectar thick); Puree  Oral Impairment Domain: Oral Impairment Domain Lip Closure: No labial escape Tongue control during bolus hold: Cohesive bolus between tongue to palatal seal Bolus preparation/mastication: Disorganized chewing/mashing with solid pieces of bolus unchewed (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) Bolus transport/lingual motion: Brisk tongue motion; Repetitive/disorganized tongue motion Oral residue: Trace residue lining oral structures Location of oral residue : Tongue Initiation of pharyngeal  swallow : Valleculae; Pyriform sinuses  Pharyngeal Impairment Domain: Pharyngeal Impairment Domain Soft palate elevation: No bolus between soft palate (SP)/pharyngeal wall (PW) Laryngeal elevation: Partial superior movement of thyroid cartilage/partial approximation of arytenoids to epiglottic petiole Anterior hyoid excursion: Partial anterior movement Epiglottic movement: Partial inversion Laryngeal vestibule closure: Incomplete, narrow column air/contrast in laryngeal vestibule Pharyngeal stripping wave : Present - diminished Pharyngoesophageal segment opening: Partial distention/partial duration, partial obstruction of flow (suspect that large osetophytes play role in clearance thru PE segment) Tongue base retraction: Trace column of contrast or air between tongue base and PPW Pharyngeal residue: Collection of residue within or on pharyngeal structures; Majority of contrast within or on pharyngeal structures Location of pharyngeal residue: Valleculae; Pharyngeal wall; Pyriform sinuses; Diffuse (>3 areas)  Esophageal Impairment Domain: No data recorded Pill: No data recorded Penetration/Aspiration Scale Score: Penetration/Aspiration Scale Score 3.  Material enters airway, remains ABOVE vocal cords and not ejected out: Puree 8.  Material enters airway, passes BELOW cords without attempt by patient to eject out (silent aspiration) : Thin liquids (Level 0); Mildly thick liquids (Level 2, nectar  thick) Compensatory Strategies: No data recorded  General Information: Caregiver present: No  Diet Prior to this Study: Dysphagia 1 (pureed); Thin liquids (Level 0)   Temperature : Normal   Respiratory Status: WFL   Supplemental O2: None (Room air)   History of Recent Intubation: No  Behavior/Cognition: Alert; Cooperative; Pleasant mood (emotional d/t recent staging of cancer) Self-Feeding Abilities: Able to self-feed Baseline vocal quality/speech: Abnormal resonance (Wet; low vocal intensity) Volitional Cough: Able to elicit  Volitional Swallow: Unable to elicit (attempted but unable to complete swallow) Exam Limitations: Other (comment) (bolus adminstration and bolus consistencies limited by severe trismus) Goal Planning: Prognosis for improved oropharyngeal function: Fair Barriers to Reach Goals: Overall medical prognosis; Severity of deficits; Medication (chemo, radiation) No data recorded No data recorded Consulted and agree with results and recommendations: Patient Pain: Pain Assessment Pain Assessment: 0-10 Pain Score: 8 Pain Location: jaw, tongue Pain Descriptors / Indicators: Aching; Constant; Contraction Pain Intervention(s): Limited activity within patient's tolerance; Monitored during session End of Session: Start Time:SLP Start Time (ACUTE ONLY): 1230 Stop Time: SLP Stop Time (ACUTE ONLY): 1300 Time Calculation:SLP Time Calculation (min) (ACUTE ONLY): 30 min Charges: SLP Evaluations $ SLP Speech Visit: 1 Visit SLP Evaluations $MBS Swallow: 1 Procedure SLP visit diagnosis: SLP Visit Diagnosis: Dysphagia, oropharyngeal phase (R13.12) Past Medical History: Past Medical History: Diagnosis Date  Diabetes mellitus without complication (HCC)   Hyperlipidemia   Hypertension  Past Surgical History: Past Surgical History: Procedure Laterality Date  arm surgery Left   CORONARY ANGIOPLASTY WITH STENT PLACEMENT    left side rotator cuff surgery    SHOULDER SURGERY   Happi Overton 12/22/2022, 3:55 PM CLINICAL DATA:  Patient with a history of oropharyngeal squamous cell carcinoma. Patient presents today with complaints of dysphagia, throat pain. EXAM: MODIFIED BARIUM SWALLOW TECHNIQUE: Different consistencies of barium were administered orally to the patient by the Speech Pathologist. Imaging of the pharynx was performed in the lateral projection. Alwyn Ren, NP was present in the fluoroscopy room during this study, which was supervised and interpreted by Dr. Grace Isaac. FLUOROSCOPY: Radiation Exposure Index (as provided by the fluoroscopic  device): 5.20 mGy Kerma COMPARISON:  Neck CT-09/28/2022 FINDINGS: Vestibular  Penetration:  Observed with all consistencies of barium. Aspiration:  Observed with thin and nectar thick barium. Other: Note is made of prominent anteriorly directed syndesmophytes within mid and inferior aspects of the cervical spine as demonstrated on recent neck CT IMPRESSION: Vestibular penetration with all consistencies of barium. Aspiration observed with thin and nectar thick barium. Read by: Alwyn Ren, NP Please refer to the Speech Pathologists report for complete details and recommendations. Electronically Signed   By: Simonne Come M.D.   On: 12/21/2022 13:38   Assessment and Plan:   Kenneth Gilmore is a 72 y.o. y/o male who comes in with a history of oropharyngeal cancer with treatment using chemotherapy and radiation and the inability to open up his mouth and keep up his nutrition.  The patient will be set up for PEG tube placement for nutritional support.  The patient has been explained the plan including the risks of bleeding, anesthesia, perforation, and infection and puncturing some other part of his intestines while placing the PEG.  The patient states that he understands the plan and agrees with it.    Midge Minium, MD. Clementeen Graham    Note: This dictation was prepared with Dragon dictation along with smaller phrase technology. Any transcriptional errors that result from this process are unintentional.

## 2022-12-27 NOTE — Progress Notes (Signed)
[facility] Clinical Social Work  Initial Assessment   Kenneth Gilmore is a 72 y.o. year old male contacted by phone. Clinical Social Work was referred by medical provider for assessment of psychosocial needs.   SDOH (Social Determinants of Health) assessments performed: Yes SDOH Interventions    Flowsheet Row Clinical Support from 12/27/2022 in [facility] at [facility]  SDOH Interventions   Food Insecurity Interventions Intervention Not Indicated  Housing Interventions Intervention Not Indicated  Transportation Interventions Intervention Not Indicated  Utilities Interventions Intervention Not Indicated  Financial Strain Interventions Intervention Not Indicated  Stress Interventions Provide Counseling  Social Connections Interventions Intervention Not Indicated       SDOH Screenings   Food Insecurity: No Food Insecurity (12/27/2022)  Housing: Low Risk  (12/27/2022)  Transportation Needs: No Transportation Needs (12/27/2022)  Utilities: Not At Risk (12/27/2022)  Financial Resource Strain: Low Risk  (12/27/2022)  Social Connections: Moderately Integrated (12/27/2022)  Stress: Stress Concern Present (12/27/2022)  Tobacco Use: High Risk (12/27/2022)     Distress Screen completed: No    02/10/2018   10:43 AM  ONCBCN DISTRESS SCREENING  Screening Type Initial Screening  Distress experienced in past week (1-10) 0      Family/Social Information:  Housing Arrangement: patient lives alone. Family members/support persons in your life? Family and Friends Transportation concerns: no  Employment: Retired  Income source: Actor concerns: No Type of concern: None Food access concerns: no Religious or spiritual practice: Yes Services Currently in place:  [facility] Medicare  Coping/ Adjustment to diagnosis: Patient understands treatment plan and what happens next? yes Concerns about diagnosis and/or treatment: Pain or discomfort during  procedures Patient reported stressors: Anxiety/ nervousness regarding receiving a feeding tube due to his inability for adequate nutrition. Hopes and/or priorities: That the feeding tube is only temporary. Patient enjoys being outside Current coping skills/ strengths: Capable of independent living , Communication skills , Contractor , General fund of knowledge , Motivation for treatment/growth , and Supportive family/friends     SUMMARY: Current SDOH Barriers:  Patient identified no SDOH Barriers regarding his stress around a feeding tube.  Clinical Social Work Clinical Goal(s):  Demonstrate a reduction in symptoms related to :Anxiety .  He said his anxiety is directly related to getting a feeding tube.  His fear is that it will be permanent.  Interventions: Discussed common feeling and emotions when being diagnosed with cancer, and the importance of support during treatment Informed patient of the support team roles and support services at [facility] Provided CSW contact information and encouraged patient to call with any questions or concerns Provided patient with information about program and CSW role.  Patient was agreeable to having CSW contact him in the future to address his anxiety.  He expressed a very good support system.   Follow Up Plan: CSW will follow-up with patient by phone  Patient verbalizes understanding of plan: Yes    Dorothey Baseman, LCSW Clinical Social Worker [facility]

## 2022-12-28 ENCOUNTER — Inpatient Hospital Stay: Payer: Medicare Other

## 2022-12-28 ENCOUNTER — Inpatient Hospital Stay (HOSPITAL_BASED_OUTPATIENT_CLINIC_OR_DEPARTMENT_OTHER): Payer: Medicare Other | Admitting: Hospice and Palliative Medicine

## 2022-12-28 ENCOUNTER — Encounter: Payer: Self-pay | Admitting: Hospice and Palliative Medicine

## 2022-12-28 ENCOUNTER — Other Ambulatory Visit: Payer: Self-pay

## 2022-12-28 ENCOUNTER — Ambulatory Visit: Payer: Medicare Other | Admitting: Speech Pathology

## 2022-12-28 ENCOUNTER — Ambulatory Visit
Admission: RE | Admit: 2022-12-28 | Discharge: 2022-12-28 | Disposition: A | Discharge status: Home / Self Care | Payer: Medicare Other | Source: Ambulatory Visit | Attending: Radiation Oncology | Admitting: Radiation Oncology

## 2022-12-28 VITALS — BP 114/79 | HR 67 | Temp 96.5°F | Resp 18 | Ht 69.0 in | Wt 181.9 lb

## 2022-12-28 DIAGNOSIS — E860 Dehydration: Secondary | ICD-10-CM

## 2022-12-28 DIAGNOSIS — R252 Cramp and spasm: Secondary | ICD-10-CM | POA: Diagnosis not present

## 2022-12-28 DIAGNOSIS — Z510 Encounter for antineoplastic radiation therapy: Secondary | ICD-10-CM | POA: Diagnosis not present

## 2022-12-28 DIAGNOSIS — R1310 Dysphagia, unspecified: Secondary | ICD-10-CM | POA: Diagnosis not present

## 2022-12-28 DIAGNOSIS — E43 Unspecified severe protein-calorie malnutrition: Secondary | ICD-10-CM | POA: Diagnosis not present

## 2022-12-28 DIAGNOSIS — C109 Malignant neoplasm of oropharynx, unspecified: Secondary | ICD-10-CM | POA: Diagnosis not present

## 2022-12-28 DIAGNOSIS — Z7982 Long term (current) use of aspirin: Secondary | ICD-10-CM | POA: Diagnosis not present

## 2022-12-28 DIAGNOSIS — C770 Secondary and unspecified malignant neoplasm of lymph nodes of head, face and neck: Secondary | ICD-10-CM | POA: Diagnosis not present

## 2022-12-28 DIAGNOSIS — G893 Neoplasm related pain (acute) (chronic): Secondary | ICD-10-CM | POA: Diagnosis not present

## 2022-12-28 DIAGNOSIS — R1312 Dysphagia, oropharyngeal phase: Secondary | ICD-10-CM | POA: Diagnosis not present

## 2022-12-28 DIAGNOSIS — F17220 Nicotine dependence, chewing tobacco, uncomplicated: Secondary | ICD-10-CM | POA: Diagnosis not present

## 2022-12-28 DIAGNOSIS — Z86718 Personal history of other venous thrombosis and embolism: Secondary | ICD-10-CM | POA: Diagnosis not present

## 2022-12-28 DIAGNOSIS — Z5111 Encounter for antineoplastic chemotherapy: Secondary | ICD-10-CM | POA: Diagnosis not present

## 2022-12-28 DIAGNOSIS — Z79899 Other long term (current) drug therapy: Secondary | ICD-10-CM | POA: Diagnosis not present

## 2022-12-28 DIAGNOSIS — R471 Dysarthria and anarthria: Secondary | ICD-10-CM | POA: Diagnosis not present

## 2022-12-28 LAB — RAD ONC ARIA SESSION SUMMARY
Course Elapsed Days: 19
Plan Fractions Treated to Date: 14
Plan Prescribed Dose Per Fraction: 2 Gy
Plan Total Fractions Prescribed: 35
Plan Total Prescribed Dose: 70 Gy
Reference Point Dosage Given to Date: 28 Gy
Reference Point Session Dosage Given: 2 Gy
Session Number: 14

## 2022-12-28 LAB — BASIC METABOLIC PANEL - CANCER CENTER ONLY
Anion gap: 15 (ref 5–15)
BUN: 22 mg/dL (ref 8–23)
CO2: 26 mmol/L (ref 22–32)
Calcium: 9.3 mg/dL (ref 8.9–10.3)
Chloride: 93 mmol/L — ABNORMAL LOW (ref 98–111)
Creatinine: 0.84 mg/dL (ref 0.61–1.24)
GFR, Estimated: >60 mL/min (ref >60)
Glucose, Bld: 162 mg/dL — ABNORMAL HIGH (ref 70–99)
Potassium: 5.1 mmol/L (ref 3.5–5.1)
Sodium: 134 mmol/L — ABNORMAL LOW (ref 135–145)

## 2022-12-28 LAB — CBC WITH DIFFERENTIAL (CANCER CENTER ONLY)
Abs Immature Granulocytes: 0.03 10*3/uL (ref 0.00–0.07)
Basophils Absolute: 0 10*3/uL (ref 0.0–0.1)
Basophils Relative: 0 %
Eosinophils Absolute: 0 10*3/uL (ref 0.0–0.5)
Eosinophils Relative: 0 %
HCT: 42 % (ref 39.0–52.0)
Hemoglobin: 14.6 g/dL (ref 13.0–17.0)
Immature Granulocytes: 0 %
Lymphocytes Relative: 11 %
Lymphs Abs: 0.9 10*3/uL (ref 0.7–4.0)
MCH: 32.2 pg (ref 26.0–34.0)
MCHC: 34.8 g/dL (ref 30.0–36.0)
MCV: 92.7 fL (ref 80.0–100.0)
Monocytes Absolute: 0.9 10*3/uL (ref 0.1–1.0)
Monocytes Relative: 10 %
Neutro Abs: 6.5 10*3/uL (ref 1.7–7.7)
Neutrophils Relative %: 79 %
Platelet Count: 199 10*3/uL (ref 150–400)
RBC: 4.53 MIL/uL (ref 4.22–5.81)
RDW: 11.7 % (ref 11.5–15.5)
WBC Count: 8.4 10*3/uL (ref 4.0–10.5)
nRBC: 0 % (ref 0.0–0.2)

## 2022-12-28 MED ORDER — SODIUM CHLORIDE 0.9 % IV SOLN
INTRAVENOUS | Status: DC
  Filled 2022-12-28 (×2): qty 250

## 2022-12-28 NOTE — Progress Notes (Signed)
[facility] at [facility] [phone] [phone]  Patient Care Team: Center, [facility] as PCP - General (General Practice)   NAME OF PATIENT: Kenneth Gilmore  [id number]  11-07-1950   DATE OF VISIT: 12/28/22  REASON FOR CONSULT: ZYERE ZUK is a 72 y.o. male with multiple medical problems including stage IVa oropharyngeal squamous cell carcinoma, history of DVT on Xarelto.  Patient is on concurrent chemoradiation.  INTERVAL HISTORY: Patient with recent evaluation by SLP with concerns of aspiration and dysphagia.  He has been seen by GI for consideration of PEG.  Patient presents to [facility] today for follow-up and to receive supportive care/IV fluids.  Today, patient says that he is feeling about the same.  Intake remains minimal.  He continues to endorse pain in the right face and jaw, which radiates up to his ear.  He says he is using the oxycodone minimally as he does not like pain medications.  Denies any neurologic complaints. Denies recent fevers or illnesses. Denies any easy bleeding or bruising. Denies chest pain. Denies any nausea, vomiting, constipation, or diarrhea. Denies urinary complaints. Patient offers no further specific complaints today.   PAST MEDICAL HISTORY: Past Medical History:  Diagnosis Date   Diabetes mellitus without complication (HCC)    Hyperlipidemia    Hypertension     PAST SURGICAL HISTORY:  Past Surgical History:  Procedure Laterality Date   arm surgery Left    CORONARY ANGIOPLASTY WITH STENT PLACEMENT     left side rotator cuff surgery     SHOULDER SURGERY      HEMATOLOGY/ONCOLOGY HISTORY:  Oncology History  Oropharyngeal cancer (HCC)  09/28/2022 Imaging   CT soft tissue neck with contrast showed infiltrating mass in the right tongue with multiple ipsilateral nodal metastases. The primary mass measures at least 4 cm and infiltrates parapharyngeal fat, visualization  of tumor extent is limited by artifact from dental amalgam.   10/28/2022 Initial Diagnosis   Squamous cell carcinoma of tongue (HCC) Patient has throat pain and was evaluated by ENT Dr.Bennett.  Physical examination showed cervical lymphadenopathy.  10/15/2022 ultrasound-guided biopsy of right cervical lymph node showed fibrous tissue, detach minute salivary gland tissue.  Granular debris's and a few dysplastic squamous epithelial cells, nondiagnostic.  10/22/2022 repeat ultrasound-guided biopsy of right cervical lymph node was positive for malignancy, metastatic squamous cell carcinoma, keratinizing, in a background of dense fibrosis and tumor necrosis.   10/28/2022 Cancer Staging   Staging form: Oral Cavity, AJCC 8th Edition - Clinical: Stage IVA (cT3, cN2a, cM0) - Signed by [name], MD on 11/02/2022   11/16/2022 Imaging   PET scan showed 1. Large infiltrating right oropharyngeal mass is markedly hypermetabolic and consistent with known squamous cell carcinoma. 2. Bilateral hypermetabolic metastatic cervical lymphadenopathy. 3. No evidence of metastatic disease involving the chest, abdomen/pelvis or bony structures.     12/09/2022 -  Chemotherapy   Patient is on Treatment Plan : HEAD/NECK Cisplatin (40) q7d       ALLERGIES:  has No Known Allergies.  MEDICATIONS:  Current Outpatient Medications  Medication Sig Dispense Refill   aspirin 81 MG tablet Take 81 mg by mouth daily.     atorvastatin (LIPITOR) 80 MG tablet TAKE ONE-HALF TABLET BY MOUTH AT BEDTIME FOR HIGH CHOLESTEROL REPLACES SIMVASTATIN     Cholecalciferol 25 MCG (1000 UT) tablet Take 1 tablet by mouth daily.     dexamethasone (DECADRON) 4 MG tablet Take 2 tablets (8 mg) by  mouth daily x 2 days starting the day after cisplatin chemotherapy. Take with food. 30 tablet 1   empagliflozin (JARDIANCE) 10 MG TABS tablet Take by mouth.     gabapentin (NEURONTIN) 300 MG capsule TAKE 1 CAPSULE BY MOUTH TWO TIMES A DAY     lisinopril  (PRINIVIL,ZESTRIL) 5 MG tablet Take by mouth. Take 1 tab by mouth once daily     metFORMIN (GLUCOPHAGE) 500 MG tablet Take 500 mg by mouth 2 (two) times daily.     metoprolol tartrate (LOPRESSOR) 50 MG tablet Take 50 mg by mouth 2 (two) times daily.     nystatin (MYCOSTATIN) 100000 UNIT/ML suspension Take 5 mLs (500,000 Units total) by mouth 4 (four) times daily. Swish and Spit 473 mL 1   ondansetron (ZOFRAN) 8 MG tablet Take 1 tablet (8 mg total) by mouth every 8 (eight) hours as needed for nausea or vomiting. Start on the third day after cisplatin. 30 tablet 1   oxyCODONE (OXY IR/ROXICODONE) 5 MG immediate release tablet Take 1 tablet (5 mg total) by mouth every 6 (six) hours as needed for severe pain. 30 tablet 0   prochlorperazine (COMPAZINE) 10 MG tablet Take 1 tablet (10 mg total) by mouth every 6 (six) hours as needed (Nausea or vomiting). 30 tablet 1   simvastatin (ZOCOR) 40 MG tablet Take 40 mg by mouth daily.     sucralfate (CARAFATE) 1 GM/10ML suspension Take 10 mLs (1 g total) by mouth 4 (four) times daily -  with meals and at bedtime. 420 mL 0   vitamin B-12 (CYANOCOBALAMIN) 1000 MCG tablet Take 1,000 mcg by mouth daily.     No current facility-administered medications for this visit.    VITAL SIGNS: BP 114/79   Pulse 67   Temp (!) 96.5 F (35.8 C) (Tympanic)   Resp 18   Ht 5\' 9"  (1.753 m)   Wt 181 lb 14.4 oz (82.5 kg)   BMI 26.86 kg/m  Filed Weights   12/28/22 1400  Weight: 181 lb 14.4 oz (82.5 kg)    Estimated body mass index is 26.86 kg/m as calculated from the following:   Height as of this encounter: 5\' 9"  (1.753 m).   Weight as of this encounter: 181 lb 14.4 oz (82.5 kg).  LABS: CBC:    Component Value Date/Time   WBC 8.4 12/28/2022 1338   WBC 10.1 01/21/2021 1348   HGB 14.6 12/28/2022 1338   HCT 42.0 12/28/2022 1338   PLT 199 12/28/2022 1338   MCV 92.7 12/28/2022 1338   NEUTROABS 6.5 12/28/2022 1338   LYMPHSABS 0.9 12/28/2022 1338   MONOABS 0.9  12/28/2022 1338   EOSABS 0.0 12/28/2022 1338   BASOSABS 0.0 12/28/2022 1338   Comprehensive Metabolic Panel:    Component Value Date/Time   NA 134 (L) 12/28/2022 1338   K 5.1 12/28/2022 1338   CL 93 (L) 12/28/2022 1338   CO2 26 12/28/2022 1338   BUN 22 12/28/2022 1338   CREATININE 0.84 12/28/2022 1338   GLUCOSE 162 (H) 12/28/2022 1338   CALCIUM 9.3 12/28/2022 1338   AST 23 12/23/2022 0801   ALT 20 12/23/2022 0801   ALKPHOS 68 12/23/2022 0801   BILITOT 0.7 12/23/2022 0801   PROT 7.7 12/23/2022 0801   ALBUMIN 4.2 12/23/2022 0801    RADIOGRAPHIC STUDIES: DG SWALLOW FUNC OP MEDICARE SPEECH PATH  Result Date: 12/22/2022 Table formatting from the original result was not included. Modified Barium Swallow Study Patient Details Name: LAMONT SHELLS MRN:  [id number] Date of Birth: 1950-08-27 Today's Date: 12/22/2022 HPI/PMH: HPI: Pt is a 72 year old male with past medical history of DVT who is currently undergoing concurrent chemoradiation treatment for at 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 Soft tissue neck - 09/30/2022  Infiltrating mass in the right tongue measuring   up to 4 cm. Prominent submucosal involvement including in the right   parapharyngeal fat. Assessment of relationship to the pterygoids and   extrinsic tongue musculature is somewhat hindered by streak artifact   from dental amalgam.      US biopsy - 10/22/2022  Lymph node, right cervical:  Positive for malignancy; Metastatic Squamous Cell Carcinoma, Keratinizing, in a background of dense fibrosis and tumor necrosis  An immunohistochemical study directed against p16 is strongly and   diffusely positive, indicating an HPV driven malignancy. Clinical Impression: Pt presents severe oropharyngeal dysphagia when consuming thin liquids via cup, nectar thick liquids via cup and puree.      His dysphagia is multifactorial in nature:  severe trismus - trials limited in study d/t inability to open mouth to receive spoon, graham  cracker or barium tablet  increased pharyngeal weakness (BOT/hyolaryngeal weakness resulting in decreased epiglottic ) from chemoradiation for oropharyngeal cancer  tumor burden (see CT Soft Tissue Neck)  baseline structural dysphagia d/t the appearance of large osteophytes at C4-C5     As a result, pt presents with silent aspiration of thin liquids and nectar thick liquids and decreased intake of puree with ineffiecent pharyngeal clearing of puree resulting in multiple swallows. At this time, recommend pt be NPO with water protocol following oral care, intensive trismus and pharyngeal treatment with serious consideration for alternative means of nutrition to prevent further dehydration and malnutrition while undergoing cancer treatments. Factors that may increase risk of adverse event in presence of aspiration Rubye Oaks & Clearance Coots 2021): Factors that may increase risk of adverse event in presence of aspiration Rubye Oaks & Clearance Coots 2021): Frail or deconditioned; Inadequate oral hygiene; Reduced saliva; Weak cough (orophrayngeal mass, radiation treatment for oropharyngeal mass, weakness/deconditioning d/t weight loss associated with severe trismus; severe trismus) Recommendations/Plan: Swallowing Evaluation Recommendations Swallowing Evaluation Recommendations Recommendations: NPO; Free water protocol after oral care; Alternative means of nutrition - G Tube Medication Administration: Via alternative means Supervision: Patient able to self-feed Oral care recommendations: Oral care QID (4x/day) Recommended consults: Consider GI consultation (for G-Tube placement) Treatment Plan Treatment Plan Treatment recommendations: Other (comment) (continue Outpatient ST services) Follow-up recommendations: Outpatient SLP Functional status assessment: Patient has had a recent decline in their functional status and demonstrates the ability to make significant improvements in function in a reasonable and predictable amount of time.  Recommendations Recommendations for follow up therapy are one component of a multi-disciplinary discharge planning process, led by the attending physician.  Recommendations may be updated based on patient status, additional functional criteria and insurance authorization. Assessment: Orofacial Exam: Orofacial Exam Oral Cavity: Oral Hygiene: WFL Oral Cavity - Dentition: Adequate natural dentition Orofacial Anatomy: WFL Anatomy: Anatomy: -- (Large appearing osteophytes at C4-C5) Boluses Administered: Boluses Administered Boluses Administered: Thin liquids (Level 0); Mildly thick liquids (Level 2, nectar thick); Puree  Oral Impairment Domain: Oral Impairment Domain Lip Closure: No labial escape Tongue control during bolus hold: Cohesive bolus between tongue to palatal seal Bolus preparation/mastication: Disorganized chewing/mashing with solid pieces of bolus unchewed (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) Bolus transport/lingual motion: Brisk tongue motion; Repetitive/disorganized tongue motion Oral residue: Trace residue lining oral structures Location of oral residue : Tongue  Initiation of pharyngeal swallow : Valleculae; Pyriform sinuses  Pharyngeal Impairment Domain: Pharyngeal Impairment Domain Soft palate elevation: No bolus between soft palate (SP)/pharyngeal wall (PW) Laryngeal elevation: Partial superior movement of thyroid cartilage/partial approximation of arytenoids to epiglottic petiole Anterior hyoid excursion: Partial anterior movement Epiglottic movement: Partial inversion Laryngeal vestibule closure: Incomplete, narrow column air/contrast in laryngeal vestibule Pharyngeal stripping wave : Present - diminished Pharyngoesophageal segment opening: Partial distention/partial duration, partial obstruction of flow (suspect that large osetophytes play role in clearance thru PE segment) Tongue base retraction: Trace column of contrast or air between tongue base and PPW Pharyngeal residue:  Collection of residue within or on pharyngeal structures; Majority of contrast within or on pharyngeal structures Location of pharyngeal residue: Valleculae; Pharyngeal wall; Pyriform sinuses; Diffuse (>3 areas)  Esophageal Impairment Domain: No data recorded Pill: No data recorded Penetration/Aspiration Scale Score: Penetration/Aspiration Scale Score 3.  Material enters airway, remains ABOVE vocal cords and not ejected out: Puree 8.  Material enters airway, passes BELOW cords without attempt by patient to eject out (silent aspiration) : Thin liquids (Level 0); Mildly thick liquids (Level 2, nectar thick) Compensatory Strategies: No data recorded  General Information: Caregiver present: No  Diet Prior to this Study: Dysphagia 1 (pureed); Thin liquids (Level 0)   Temperature : Normal   Respiratory Status: WFL   Supplemental O2: None (Room air)   History of Recent Intubation: No  Behavior/Cognition: Alert; Cooperative; Pleasant mood (emotional d/t recent staging of cancer) Self-Feeding Abilities: Able to self-feed Baseline vocal quality/speech: Abnormal resonance (Wet; low vocal intensity) Volitional Cough: Able to elicit Volitional Swallow: Unable to elicit (attempted but unable to complete swallow) Exam Limitations: Other (comment) (bolus adminstration and bolus consistencies limited by severe trismus) Goal Planning: Prognosis for improved oropharyngeal function: Fair Barriers to Reach Goals: Overall medical prognosis; Severity of deficits; Medication (chemo, radiation) No data recorded No data recorded Consulted and agree with results and recommendations: Patient Pain: Pain Assessment Pain Assessment: 0-10 Pain Score: 8 Pain Location: jaw, tongue Pain Descriptors / Indicators: Aching; Constant; Contraction Pain Intervention(s): Limited activity within patient's tolerance; Monitored during session End of Session: Start Time:SLP Start Time (ACUTE ONLY): 1230 Stop Time: SLP Stop Time (ACUTE ONLY): 1300 Time  Calculation:SLP Time Calculation (min) (ACUTE ONLY): 30 min Charges: SLP Evaluations $ SLP Speech Visit: 1 Visit SLP Evaluations $MBS Swallow: 1 Procedure SLP visit diagnosis: SLP Visit Diagnosis: Dysphagia, oropharyngeal phase (R13.12) Past Medical History: Past Medical History: Diagnosis Date  Diabetes mellitus without complication (HCC)   Hyperlipidemia   Hypertension  Past Surgical History: Past Surgical History: Procedure Laterality Date  arm surgery Left   CORONARY ANGIOPLASTY WITH STENT PLACEMENT    left side rotator cuff surgery    SHOULDER SURGERY   Happi Overton 12/22/2022, 3:55 PM CLINICAL DATA:  Patient with a history of oropharyngeal squamous cell carcinoma. Patient presents today with complaints of dysphagia, throat pain. EXAM: MODIFIED BARIUM SWALLOW TECHNIQUE: Different consistencies of barium were administered orally to the patient by the Speech Pathologist. Imaging of the pharynx was performed in the lateral projection. Alwyn Ren, NP was present in the fluoroscopy room during this study, which was supervised and interpreted by Dr. Grace Isaac. FLUOROSCOPY: Radiation Exposure Index (as provided by the fluoroscopic device): 5.20 mGy Kerma COMPARISON:  Neck CT-09/28/2022 FINDINGS: Vestibular  Penetration:  Observed with all consistencies of barium. Aspiration:  Observed with thin and nectar thick barium. Other: Note is made of prominent anteriorly directed syndesmophytes within mid and inferior aspects of the cervical spine  as demonstrated on recent neck CT IMPRESSION: Vestibular penetration with all consistencies of barium. Aspiration observed with thin and nectar thick barium. Read by: Alwyn Ren, NP Please refer to the Speech Pathologists report for complete details and recommendations. Electronically Signed   By: Simonne Come M.D.   On: 12/21/2022 13:38   PERFORMANCE STATUS (ECOG) : 1 - Symptomatic but completely ambulatory  Review of Systems Unless otherwise noted, a complete review of  systems is negative.  Physical Exam General: NAD Cardiovascular: regular rate and rhythm Pulmonary: clear ant fields Abdomen: soft, nontender, + bowel sounds GU: no suprapubic tenderness Extremities: no edema, no joint deformities Skin: no rashes Neurological: Nonfocal  IMPRESSION/PLAN: Stage IVa oropharyngeal cancer -on concurrent chemoradiation  Neoplasm related pain -encourage patient to utilize his oxycodone.  He is only taking it seldomly.  Could consider initiation of a long-acting opioid if needed but I would first maximize use of oxycodone.  Patient denies having any difficulty swallowing oxycodone but could consider switching to elixir if needed.  Weight loss -patient unable to meet caloric needs.  He is pending PEG placement.  Proceed with supportive care and IV fluids today.  Patient will RTC on 5/9 to see Dr. Cathie Hoops.  He will likely benefit from weekly supportive care.   Patient expressed understanding and was in agreement with this plan. He also understands that He can call clinic at any time with any questions, concerns, or complaints.   Thank you for allowing me to participate in the care of this very pleasant patient.   Time Total: 15 minutes  Visit consisted of counseling and education dealing with the complex and emotionally intense issues of symptom management in the setting of serious illness.Greater than 50%  of this time was spent counseling and coordinating care related to the above assessment and plan.  Signed by: Laurette Schimke, PhD, NP-C

## 2022-12-28 NOTE — Therapy (Signed)
OUTPATIENT SPEECH LANGUAGE PATHOLOGY  TREATMENT NOTE   Patient Name: Kenneth Gilmore MRN: [id number] DOB:[birth year], 72 y.o., male Today's Date: 12/28/2022  PCP: No PCP REFERRING PROVIDER: Carmina Miller, MD  END OF SESSION:  End of Session - 12/28/22 0906     Visit Number 4    Number of Visits 17    Date for SLP Re-Evaluation 02/09/23    Authorization Type [employer]    Progress Note Due on Visit 10    SLP Start Time 0900    SLP Stop Time  0930    SLP Time Calculation (min) 30 min    Activity Tolerance Patient tolerated treatment well             Past Medical History:  Diagnosis Date   Diabetes mellitus without complication (HCC)    Hyperlipidemia    Hypertension    Past Surgical History:  Procedure Laterality Date   arm surgery Left    CORONARY ANGIOPLASTY WITH STENT PLACEMENT     left side rotator cuff surgery     SHOULDER SURGERY     Patient Active Problem List   Diagnosis Date Noted   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/23/2022   Dysphagia 12/16/2022   Thrush 12/16/2022   Encounter for antineoplastic chemotherapy 12/09/2022   Neoplasm related pain 12/09/2022   Goals of care, counseling/discussion 11/02/2022   History of DVT (deep vein thrombosis) 11/02/2022   Oropharyngeal cancer (HCC) 10/28/2022    ONSET DATE: 10/28/2022 date of diagnosis;  12/14/2022 date of referral  REFERRING DIAG: C10.9 (ICD-10-CM) - Oropharyngeal cancer (HCC)   THERAPY DIAG:  Trismus  Dysphagia, oropharyngeal phase  Rationale for Evaluation and Treatment: Rehabilitation  SUBJECTIVE:   SUBJECTIVE STATEMENT: Pt stated that he didn't "feel good" likely result of chemoradiation Pt accompanied by: self  PERTINENT HISTORY:  Pt is a 72 year old male with past medical history of DVT who is currently undergoing concurrent chemoradiation treatment for at 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   DIAGNOSTIC FINDINGS:  CT Soft tissue neck - 09/30/2022 Infiltrating mass  in the right tongue measuring  up to 4 cm. Prominent submucosal involvement including in the right  parapharyngeal fat. Assessment of relationship to the pterygoids and  extrinsic tongue musculature is somewhat hindered by streak artifact  from dental amalgam.   US biopsy - 10/22/2022 Lymph node, right cervical: Positive for malignancy; Metastatic Squamous Cell Carcinoma, Keratinizing, in a background of dense fibrosis and tumor necrosis An immunohistochemical study directed against p16 is strongly and  diffusely positive, indicating an HPV driven malignancy.   PAIN:  Are you having pain? Yes: NPRS scale: 8/10 Pain location: right tongue and jaw Pain description: pt with difficulty describing Aggravating factors: none Relieving factors: heat  FALLS: Has patient fallen in last 6 months?  No  LIVING ENVIRONMENT: Lives with: lives alone Lives in: House/apartment  PLOF:  Level of assistance: Independent with ADLs, Independent with IADLs Employment: Retired  PATIENT GOALS: to improve mandibular opening  OBJECTIVE:   TODAY'S TREATMENT Skilled treatment session focused on pt's dysphagia, speech intelligibility and trismus goals. SLP facilitated session by providing the following interventions:  SLP requested and received ENT evaluation and notes. New information added under pertinant history (see above). Given reduced number of radiation treatments thus far, suspect that pt's trismus is related to tumor burden. As such, secure chat sent to pt's ENT, Dr Geanie Logan - see below    SLP facilitated today's session by providing verbal and written instructions for cervical  ROM exercises. Pt currently presents with minimal cervical ROM and turns his upper body to locate objects. Extensive education provided that Neck range of motion exercises should be done to the point of feeling a GENTLE, TOLERABLE stretch only. Demonstration provided with pt able to imitate for the following  stretches: Head Tilt: Forward and Back - Gently bow your head and try to touch your chin to your chest. Raise your chin back to the starting position. Tilt your head back as far as possible so you are looking up at the ceiling. Return your head to the starting position. - pt with no movement noted when bowing head down, minimal movement noted when tilting head back Head Tilt: Side to Side: Tilt your head to the side, bringing your ear toward your shoulder. Do not raise your shoulder to your ear. Keep your shoulder still. Return your head to the starting position. - minimal movement noted bilaterally Head turns: Turn your head to look over your shoulder. Tilt your chin down and try to touch it to your shoulder. Do not raise your shoulder to your chin. Face forward again - minimal movement noted bilaterally  Pt provided with customized ARK-J to increase mandibular ROM.  Passive ROM - Place device between front teeth, relax mouth to allow for the device to gently open your mouth. Hold for 10 seconds.        Water Protocol - pt instructed to brush his teeth and consume ice chips to aid in perseveration of swallow musculature and to thin oral secretions  PATIENT EDUCATION: Education details: see above Person educated: Patient Education method: Explanation Education comprehension: verbalized understanding and needs further education  Research states the risk for dysphagia increases due to radiation and/or chemotherapy treatment due to a variety of factors, so SLP educated the pt about the possibility of reduced/limited ability for PO intake during rad tx. SLP also educated pt regarding possible changes to swallowing musculature after rad tx, and why adherence to dysphagia HEP provided today and PO consumption was necessary to inhibit muscle fibrosis following rad tx and to mitigate muscle disuse atrophy. SLP informed pt why this would be detrimental to their swallowing status and to their pulmonary  health. Pt demonstrated understanding of these things to SLP. SLP encouraged pt to safely eat and drink as deep into their radiation/chemotherapy as possible to provide the best possible long-term swallowing outcome for pt.    GOALS: Goals reviewed with patient? Yes  SHORT TERM GOALS: Target date: 10 sessions  Patient will participate in objective swallowing evaluation (MBSS) to identify safest diet recommendation as well as therapeutic targets. Baseline: Goal status: INITIAL  2.  The patient will achieve 90% intelligibility with use of compensatory speech intelligibility strategies while reading aloud "The Grandfather Passage" and "The Rainbow Passage" to an unfamiliar listener.  Baseline:  Goal status: INITIAL  3.  Pt will demonstrate a return to full cervical ROM and function post operatively compared to baselines and not demonstrate any signs or symptoms of lymphedema.  Baseline:  Goal status: INITIAL  LONG TERM GOALS: Target date: 02/09/2023  Pt will be able to verbalize understanding of a home exercise program for pharyngeal, trismus and cervical range of motion.  Baseline:  Goal status: INITIAL  2.  Patient will improve perception of swallowing as indicated by an improvement in EAT-10 score to 5 (Baseline = 10) by 12 weeks from initial swallowing therapy session. Baseline:  Goal status: INITIAL  3.  The patient will increase his maximal  jaw opening by 10 mm to achieve enhanced speech intelligibility as evidenced by an improved score on a standardized dysarthria assessment.  Baseline:  Goal status: INITIAL  ASSESSMENT:  CLINICAL IMPRESSION: Pt with severe oropharyngeal dysphagia resulting in silent aspiration of thin liquids and nectar thick liquids in additional to decreased consumption related to effects of radiation and oropharyngeal cancer. Pt has met with GI with plan to have PEG placed on 01/06/2023.  Data indicate that pt's swallow ability will likely decrease over  the course of radiation/chemoradiation therapy and could very well decline over time following the conclusion of that therapy due to muscle disuse atrophy and/or muscle fibrosis. Pt will continue to need to be seen by SLP in order to assess safety of PO intake, assess the need for recommending any objective swallow assessment, and ensuring pt is correctly completing the individualized HEP.   OBJECTIVE IMPAIRMENTS: include dysarthria, dysphagia, and trismus. These impairments are limiting patient from effectively communicating at home and in community and safety when swallowing. Factors affecting potential to achieve goals and functional outcome are medical prognosis and poor health literacy. Patient will benefit from skilled SLP services to address above impairments and improve overall function.  REHAB POTENTIAL: Good  PLAN:  SLP FREQUENCY: 1-2x/week  SLP DURATION: 8 weeks  PLANNED INTERVENTIONS: Aspiration precaution training, Pharyngeal strengthening exercises, Diet toleration management , Trials of upgraded texture/liquids, SLP instruction and feedback, Compensatory strategies, and Patient/family education   Erickson Yamashiro B. Dreama Saa, M.S., CCC-SLP, Tree surgeon Certified Brain Injury Specialist [facility]  [facility] Office [phone] Ascom [phone] Fax [phone]

## 2022-12-29 ENCOUNTER — Ambulatory Visit
Admission: RE | Admit: 2022-12-29 | Discharge: 2022-12-29 | Disposition: A | Discharge status: Home / Self Care | Payer: Medicare Other | Source: Ambulatory Visit | Attending: Radiation Oncology | Admitting: Radiation Oncology

## 2022-12-29 ENCOUNTER — Other Ambulatory Visit: Payer: Self-pay

## 2022-12-29 DIAGNOSIS — C109 Malignant neoplasm of oropharynx, unspecified: Secondary | ICD-10-CM | POA: Diagnosis not present

## 2022-12-29 DIAGNOSIS — Z5111 Encounter for antineoplastic chemotherapy: Secondary | ICD-10-CM | POA: Diagnosis not present

## 2022-12-29 DIAGNOSIS — G893 Neoplasm related pain (acute) (chronic): Secondary | ICD-10-CM | POA: Diagnosis not present

## 2022-12-29 DIAGNOSIS — Z510 Encounter for antineoplastic radiation therapy: Secondary | ICD-10-CM | POA: Diagnosis not present

## 2022-12-29 DIAGNOSIS — Z7982 Long term (current) use of aspirin: Secondary | ICD-10-CM | POA: Diagnosis not present

## 2022-12-29 DIAGNOSIS — R1310 Dysphagia, unspecified: Secondary | ICD-10-CM | POA: Diagnosis not present

## 2022-12-29 DIAGNOSIS — Z86718 Personal history of other venous thrombosis and embolism: Secondary | ICD-10-CM | POA: Diagnosis not present

## 2022-12-29 DIAGNOSIS — E43 Unspecified severe protein-calorie malnutrition: Secondary | ICD-10-CM | POA: Diagnosis not present

## 2022-12-29 DIAGNOSIS — C770 Secondary and unspecified malignant neoplasm of lymph nodes of head, face and neck: Secondary | ICD-10-CM | POA: Diagnosis not present

## 2022-12-29 DIAGNOSIS — F17220 Nicotine dependence, chewing tobacco, uncomplicated: Secondary | ICD-10-CM | POA: Diagnosis not present

## 2022-12-29 DIAGNOSIS — Z79899 Other long term (current) drug therapy: Secondary | ICD-10-CM | POA: Diagnosis not present

## 2022-12-29 LAB — RAD ONC ARIA SESSION SUMMARY
Course Elapsed Days: 20
Plan Fractions Treated to Date: 15
Plan Prescribed Dose Per Fraction: 2 Gy
Plan Total Fractions Prescribed: 35
Plan Total Prescribed Dose: 70 Gy
Reference Point Dosage Given to Date: 30 Gy
Reference Point Session Dosage Given: 2 Gy
Session Number: 15

## 2022-12-29 MED FILL — Dexamethasone Sodium Phosphate Inj 100 MG/10ML: INTRAMUSCULAR | Qty: 1 | Status: AC

## 2022-12-30 ENCOUNTER — Other Ambulatory Visit: Payer: Self-pay | Admitting: *Deleted

## 2022-12-30 ENCOUNTER — Inpatient Hospital Stay: Payer: Medicare Other

## 2022-12-30 ENCOUNTER — Encounter: Payer: Self-pay | Admitting: Gastroenterology

## 2022-12-30 ENCOUNTER — Ambulatory Visit
Admission: RE | Admit: 2022-12-30 | Discharge: 2022-12-30 | Disposition: A | Discharge status: Home / Self Care | Payer: Medicare Other | Source: Ambulatory Visit | Attending: Oncology | Admitting: Oncology

## 2022-12-30 ENCOUNTER — Ambulatory Visit
Admission: RE | Admit: 2022-12-30 | Discharge: 2022-12-30 | Disposition: A | Discharge status: Home / Self Care | Payer: Medicare Other | Source: Ambulatory Visit | Attending: Radiation Oncology | Admitting: Radiation Oncology

## 2022-12-30 ENCOUNTER — Other Ambulatory Visit: Payer: Self-pay

## 2022-12-30 ENCOUNTER — Encounter: Payer: Self-pay | Admitting: Oncology

## 2022-12-30 ENCOUNTER — Ambulatory Visit
Admission: RE | Admit: 2022-12-30 | Discharge: 2022-12-30 | Disposition: A | Discharge status: Home / Self Care | Payer: Medicare Other | Attending: Oncology | Admitting: Oncology

## 2022-12-30 ENCOUNTER — Inpatient Hospital Stay (HOSPITAL_BASED_OUTPATIENT_CLINIC_OR_DEPARTMENT_OTHER): Payer: Medicare Other | Admitting: Oncology

## 2022-12-30 VITALS — BP 99/64 | HR 66 | Temp 96.0°F | Resp 18 | Wt 177.2 lb

## 2022-12-30 DIAGNOSIS — C109 Malignant neoplasm of oropharynx, unspecified: Secondary | ICD-10-CM | POA: Diagnosis not present

## 2022-12-30 DIAGNOSIS — Z510 Encounter for antineoplastic radiation therapy: Secondary | ICD-10-CM | POA: Diagnosis not present

## 2022-12-30 DIAGNOSIS — R531 Weakness: Secondary | ICD-10-CM | POA: Insufficient documentation

## 2022-12-30 DIAGNOSIS — R1310 Dysphagia, unspecified: Secondary | ICD-10-CM | POA: Diagnosis not present

## 2022-12-30 DIAGNOSIS — J690 Pneumonitis due to inhalation of food and vomit: Secondary | ICD-10-CM | POA: Diagnosis not present

## 2022-12-30 DIAGNOSIS — G893 Neoplasm related pain (acute) (chronic): Secondary | ICD-10-CM

## 2022-12-30 DIAGNOSIS — E43 Unspecified severe protein-calorie malnutrition: Secondary | ICD-10-CM | POA: Diagnosis not present

## 2022-12-30 DIAGNOSIS — F17220 Nicotine dependence, chewing tobacco, uncomplicated: Secondary | ICD-10-CM | POA: Diagnosis not present

## 2022-12-30 DIAGNOSIS — Z86718 Personal history of other venous thrombosis and embolism: Secondary | ICD-10-CM | POA: Diagnosis not present

## 2022-12-30 DIAGNOSIS — Z79899 Other long term (current) drug therapy: Secondary | ICD-10-CM | POA: Diagnosis not present

## 2022-12-30 DIAGNOSIS — Z7982 Long term (current) use of aspirin: Secondary | ICD-10-CM | POA: Diagnosis not present

## 2022-12-30 DIAGNOSIS — B370 Candidal stomatitis: Secondary | ICD-10-CM | POA: Diagnosis not present

## 2022-12-30 DIAGNOSIS — Z5111 Encounter for antineoplastic chemotherapy: Secondary | ICD-10-CM | POA: Diagnosis not present

## 2022-12-30 DIAGNOSIS — R252 Cramp and spasm: Secondary | ICD-10-CM | POA: Diagnosis not present

## 2022-12-30 DIAGNOSIS — E860 Dehydration: Secondary | ICD-10-CM

## 2022-12-30 DIAGNOSIS — C770 Secondary and unspecified malignant neoplasm of lymph nodes of head, face and neck: Secondary | ICD-10-CM | POA: Diagnosis not present

## 2022-12-30 LAB — RAD ONC ARIA SESSION SUMMARY
Course Elapsed Days: 21
Plan Fractions Treated to Date: 16
Plan Prescribed Dose Per Fraction: 2 Gy
Plan Total Fractions Prescribed: 35
Plan Total Prescribed Dose: 70 Gy
Reference Point Dosage Given to Date: 32 Gy
Reference Point Session Dosage Given: 2 Gy
Session Number: 16

## 2022-12-30 LAB — CBC WITH DIFFERENTIAL (CANCER CENTER ONLY)
Abs Immature Granulocytes: 0.05 10*3/uL (ref 0.00–0.07)
Basophils Absolute: 0 10*3/uL (ref 0.0–0.1)
Basophils Relative: 0 %
Eosinophils Absolute: 0 10*3/uL (ref 0.0–0.5)
Eosinophils Relative: 0 %
HCT: 45 % (ref 39.0–52.0)
Hemoglobin: 15.3 g/dL (ref 13.0–17.0)
Immature Granulocytes: 0 %
Lymphocytes Relative: 5 %
Lymphs Abs: 0.6 10*3/uL — ABNORMAL LOW (ref 0.7–4.0)
MCH: 31.8 pg (ref 26.0–34.0)
MCHC: 34 g/dL (ref 30.0–36.0)
MCV: 93.6 fL (ref 80.0–100.0)
Monocytes Absolute: 1.6 10*3/uL — ABNORMAL HIGH (ref 0.1–1.0)
Monocytes Relative: 13 %
Neutro Abs: 10.2 10*3/uL — ABNORMAL HIGH (ref 1.7–7.7)
Neutrophils Relative %: 82 %
Platelet Count: 182 10*3/uL (ref 150–400)
RBC: 4.81 MIL/uL (ref 4.22–5.81)
RDW: 11.7 % (ref 11.5–15.5)
WBC Count: 12.4 10*3/uL — ABNORMAL HIGH (ref 4.0–10.5)
nRBC: 0 % (ref 0.0–0.2)

## 2022-12-30 LAB — CMP (CANCER CENTER ONLY)
ALT: 31 U/L (ref 0–44)
AST: 62 U/L — ABNORMAL HIGH (ref 15–41)
Albumin: 3.9 g/dL (ref 3.5–5.0)
Alkaline Phosphatase: 76 U/L (ref 38–126)
Anion gap: 20 — ABNORMAL HIGH (ref 5–15)
BUN: 25 mg/dL — ABNORMAL HIGH (ref 8–23)
CO2: 18 mmol/L — ABNORMAL LOW (ref 22–32)
Calcium: 9.3 mg/dL (ref 8.9–10.3)
Chloride: 95 mmol/L — ABNORMAL LOW (ref 98–111)
Creatinine: 1.18 mg/dL (ref 0.61–1.24)
GFR, Estimated: >60 mL/min (ref >60)
Glucose, Bld: 205 mg/dL — ABNORMAL HIGH (ref 70–99)
Potassium: 5 mmol/L (ref 3.5–5.1)
Sodium: 133 mmol/L — ABNORMAL LOW (ref 135–145)
Total Bilirubin: 1.8 mg/dL — ABNORMAL HIGH (ref 0.3–1.2)
Total Protein: 7.6 g/dL (ref 6.5–8.1)

## 2022-12-30 LAB — MAGNESIUM: Magnesium: 1.9 mg/dL (ref 1.7–2.4)

## 2022-12-30 MED ORDER — SODIUM CHLORIDE 0.9 % IV SOLN
INTRAVENOUS | Status: DC
  Filled 2022-12-30 (×2): qty 250

## 2022-12-30 MED FILL — Fosaprepitant Dimeglumine For IV Infusion 150 MG (Base Eq): INTRAVENOUS | Qty: 5 | Status: AC

## 2022-12-30 NOTE — Progress Notes (Signed)
No treatment today per Dr. Cathie Hoops ordered STAT xray and patient will come back to cancer center for 1L IVF.

## 2022-12-30 NOTE — Assessment & Plan Note (Addendum)
stage IVA oropharyngeal squamous cell carcinoma, Patient is on concurrent chemotherapy with Radiation.  Labs are reviewed and discussed with patient. Hold  cisplatin 40mg /m2 today due to weakness. Discussed with Radonc will hold off RT

## 2022-12-30 NOTE — Patient Instructions (Signed)
[facility] [place] [facility]  Discharge Instructions: Thank you for choosing [facility] to provide your oncology and hematology care.  If you have a lab appointment with the Cancer Center, please go directly to the Cancer Center and check in at the registration area.  Wear comfortable clothing and clothing appropriate for easy access to any Portacath or PICC line.   We strive to give you quality time with your provider. You may need to reschedule your appointment if you arrive late (15 or more minutes).  Arriving late affects you and other patients whose appointments are after yours.  Also, if you miss three or more appointments without notifying the office, you may be dismissed from the clinic at the provider's discretion.      For prescription refill requests, have your pharmacy contact our office and allow 72 hours for refills to be completed.    Today you received IV fluids    To help prevent nausea and vomiting after your treatment, we encourage you to take your nausea medication as directed.  BELOW ARE SYMPTOMS THAT SHOULD BE REPORTED IMMEDIATELY: *FEVER GREATER THAN 100.4 F (38 C) OR HIGHER *CHILLS OR SWEATING *NAUSEA AND VOMITING THAT IS NOT CONTROLLED WITH YOUR NAUSEA MEDICATION *UNUSUAL SHORTNESS OF BREATH *UNUSUAL BRUISING OR BLEEDING *URINARY PROBLEMS (pain or burning when urinating, or frequent urination) *BOWEL PROBLEMS (unusual diarrhea, constipation, pain near the anus) TENDERNESS IN MOUTH AND THROAT WITH OR WITHOUT PRESENCE OF ULCERS (sore throat, sores in mouth, or a toothache) UNUSUAL RASH, SWELLING OR PAIN  UNUSUAL VAGINAL DISCHARGE OR ITCHING   Items with * indicate a potential emergency and should be followed up as soon as possible or go to the Emergency Department if any problems should occur.  Please show the CHEMOTHERAPY ALERT CARD or IMMUNOTHERAPY ALERT CARD at check-in to the Emergency Department and triage nurse.  Should you  have questions after your visit or need to cancel or reschedule your appointment, please contact [facility] [place] [facility]  [phone] and follow the prompts.  Office hours are 8:00 a.m. to 4:30 p.m. Monday - Friday. Please note that voicemails left after 4:00 p.m. may not be returned until the following business day.  We are closed weekends and major holidays. You have access to a nurse at all times for urgent questions. Please call the main number to the clinic [phone] and follow the prompts.  For any non-urgent questions, you may also contact your provider using MyChart. We now offer e-Visits for anyone 18 and older to request care online for non-urgent symptoms. For details visit [email or web address].   Also download the MyChart app! Go to the app store, search "MyChart", open the app, select [facility], and log in with your MyChart username and password.    

## 2022-12-30 NOTE — Progress Notes (Signed)
Nutrition Follow-up:   Patient with oropharyngeal SCC stage IV.  Patient receiving concurrent chemotherapy and radiation. Chemo held today and stat chest xray ordered.  Met with patient during IV fluids.  States that he does not feel well.  Says that he has only been able to drink water for several days.  RD suggested soup and says that the thought of it makes him sick.  Has not taken any nausea medication.    G-tube planned for 5/16 by GI.      Medications: compazine, zofran,   Labs: Na 133, glucose 205, BUN 25, creatinine 1.18  Anthropometrics:   Weight 177 lb 3.2 oz today  186 lb 4.8 oz on 5/2 190 lb 4.8 oz on 4/25 UBW of 205-210 lb about 3-4 months ago  7% weight loss in the last 2 weeks, significant   NUTRITION DIAGNOSIS: Inadequate oral intake continues  Patient meets criteria for severe malnutrition in context of acute illness as evidenced by 7% weight loss in the last 2 weeks and eating less than 50% of estimate needs for > or equal to 5 days.  INTERVENTION:  Reviewed how to administer feeding with patient via G-tube Reviewed tube feeding regimen.  Written instructions provided.  Patient verbalized understanding. Appointment with [facility] tomorrow for evaluation and fluids.  Will need support until able to place feeding tube.  Bolus supplies and isosource 1.5 given to patient today.   Discussed food/liquids patient can try orally.  Once again discussed importance of taking nausea medication Contact number provided    MONITORING, EVALUATION, GOAL: weight trends, intake, tube feeding tolerance   NEXT VISIT: Friday, May 17   Nephi Savage B. Freida Busman, RD, LDN Registered Dietitian [phone]

## 2022-12-31 ENCOUNTER — Inpatient Hospital Stay (HOSPITAL_BASED_OUTPATIENT_CLINIC_OR_DEPARTMENT_OTHER): Payer: Medicare Other | Admitting: Medical Oncology

## 2022-12-31 ENCOUNTER — Encounter: Payer: Self-pay | Admitting: Medical Oncology

## 2022-12-31 ENCOUNTER — Inpatient Hospital Stay: Payer: Medicare Other

## 2022-12-31 ENCOUNTER — Other Ambulatory Visit: Payer: Self-pay

## 2022-12-31 ENCOUNTER — Encounter: Payer: Self-pay | Admitting: Oncology

## 2022-12-31 ENCOUNTER — Other Ambulatory Visit: Payer: Self-pay | Admitting: *Deleted

## 2022-12-31 ENCOUNTER — Ambulatory Visit: Payer: Medicare Other

## 2022-12-31 ENCOUNTER — Ambulatory Visit: Payer: Medicare Other | Admitting: Physician Assistant

## 2022-12-31 VITALS — BP 109/78 | HR 89 | Temp 97.0°F | Wt 176.0 lb

## 2022-12-31 VITALS — BP 117/78 | HR 78

## 2022-12-31 DIAGNOSIS — C109 Malignant neoplasm of oropharynx, unspecified: Secondary | ICD-10-CM

## 2022-12-31 DIAGNOSIS — C770 Secondary and unspecified malignant neoplasm of lymph nodes of head, face and neck: Secondary | ICD-10-CM | POA: Diagnosis not present

## 2022-12-31 DIAGNOSIS — Z7982 Long term (current) use of aspirin: Secondary | ICD-10-CM | POA: Diagnosis not present

## 2022-12-31 DIAGNOSIS — R531 Weakness: Secondary | ICD-10-CM | POA: Diagnosis not present

## 2022-12-31 DIAGNOSIS — R1310 Dysphagia, unspecified: Secondary | ICD-10-CM | POA: Diagnosis not present

## 2022-12-31 DIAGNOSIS — E860 Dehydration: Secondary | ICD-10-CM

## 2022-12-31 DIAGNOSIS — G893 Neoplasm related pain (acute) (chronic): Secondary | ICD-10-CM | POA: Diagnosis not present

## 2022-12-31 DIAGNOSIS — Z510 Encounter for antineoplastic radiation therapy: Secondary | ICD-10-CM | POA: Diagnosis not present

## 2022-12-31 DIAGNOSIS — E43 Unspecified severe protein-calorie malnutrition: Secondary | ICD-10-CM | POA: Diagnosis not present

## 2022-12-31 DIAGNOSIS — R252 Cramp and spasm: Secondary | ICD-10-CM | POA: Diagnosis not present

## 2022-12-31 DIAGNOSIS — Z5111 Encounter for antineoplastic chemotherapy: Secondary | ICD-10-CM | POA: Diagnosis not present

## 2022-12-31 DIAGNOSIS — Z79899 Other long term (current) drug therapy: Secondary | ICD-10-CM | POA: Diagnosis not present

## 2022-12-31 DIAGNOSIS — Z86718 Personal history of other venous thrombosis and embolism: Secondary | ICD-10-CM | POA: Diagnosis not present

## 2022-12-31 LAB — CBC WITH DIFFERENTIAL (CANCER CENTER ONLY)
Abs Immature Granulocytes: 0.04 10*3/uL (ref 0.00–0.07)
Basophils Absolute: 0 10*3/uL (ref 0.0–0.1)
Basophils Relative: 0 %
Eosinophils Absolute: 0 10*3/uL (ref 0.0–0.5)
Eosinophils Relative: 0 %
HCT: 41.2 % (ref 39.0–52.0)
Hemoglobin: 14.2 g/dL (ref 13.0–17.0)
Immature Granulocytes: 0 %
Lymphocytes Relative: 8 %
Lymphs Abs: 0.7 10*3/uL (ref 0.7–4.0)
MCH: 31.9 pg (ref 26.0–34.0)
MCHC: 34.5 g/dL (ref 30.0–36.0)
MCV: 92.6 fL (ref 80.0–100.0)
Monocytes Absolute: 1.3 10*3/uL — ABNORMAL HIGH (ref 0.1–1.0)
Monocytes Relative: 14 %
Neutro Abs: 7.2 10*3/uL (ref 1.7–7.7)
Neutrophils Relative %: 78 %
Platelet Count: 158 10*3/uL (ref 150–400)
RBC: 4.45 MIL/uL (ref 4.22–5.81)
RDW: 11.9 % (ref 11.5–15.5)
WBC Count: 9.2 10*3/uL (ref 4.0–10.5)
nRBC: 0 % (ref 0.0–0.2)

## 2022-12-31 LAB — CMP (CANCER CENTER ONLY)
ALT: 26 U/L (ref 0–44)
AST: 38 U/L (ref 15–41)
Albumin: 3.5 g/dL (ref 3.5–5.0)
Alkaline Phosphatase: 65 U/L (ref 38–126)
Anion gap: 18 — ABNORMAL HIGH (ref 5–15)
BUN: 24 mg/dL — ABNORMAL HIGH (ref 8–23)
CO2: 18 mmol/L — ABNORMAL LOW (ref 22–32)
Calcium: 8.8 mg/dL — ABNORMAL LOW (ref 8.9–10.3)
Chloride: 96 mmol/L — ABNORMAL LOW (ref 98–111)
Creatinine: 1 mg/dL (ref 0.61–1.24)
GFR, Estimated: >60 mL/min (ref >60)
Glucose, Bld: 159 mg/dL — ABNORMAL HIGH (ref 70–99)
Potassium: 3.8 mmol/L (ref 3.5–5.1)
Sodium: 132 mmol/L — ABNORMAL LOW (ref 135–145)
Total Bilirubin: 1.5 mg/dL — ABNORMAL HIGH (ref 0.3–1.2)
Total Protein: 6.8 g/dL (ref 6.5–8.1)

## 2022-12-31 MED ORDER — SODIUM CHLORIDE 0.9 % IV SOLN
INTRAVENOUS | Status: DC
  Filled 2022-12-31 (×2): qty 250

## 2022-12-31 NOTE — Progress Notes (Signed)
Hematology and Oncology Follow Up Visit  Kenneth Gilmore [id number] August 27, 1950 72 y.o. 12/31/2022  Past Medical History:  Diagnosis Date   Diabetes mellitus without complication (HCC)    Hyperlipidemia    Hypertension    Oropharyngeal cancer (HCC)     Principle Diagnosis:  Stage IVA oropharyngeal squamous cell carcinoma  Current Therapy:   Cisplatin s/p cycle 4 XRT     Interim History:  Kenneth Gilmore is back for follow-up for trismus:  Patient has been struggling with treatment recently. He has Trismus and has not been able to eat much. He reports that this has not worsened but has not really improved recently. He reports that Boost/Ensure are extremely unappetizing to him so he avoid them. He is able to drink milkshakes, jello, pudding. He is eating them and drinking water. He denies any SOB, cough, trouble breathing, fever, nausea, diarrhea. He is scheduled to have a feeding tube placed next Thursday.      Wt Readings from Last 3 Encounters:  12/31/22 176 lb (79.8 kg)  12/30/22 177 lb 3.2 oz (80.4 kg)  12/28/22 181 lb 14.4 oz (82.5 kg)     Medications:   Current Outpatient Medications:    aspirin 81 MG tablet, Take 81 mg by mouth daily., Disp: , Rfl:    atorvastatin (LIPITOR) 80 MG tablet, TAKE ONE-HALF TABLET BY MOUTH AT BEDTIME FOR HIGH CHOLESTEROL REPLACES SIMVASTATIN, Disp: , Rfl:    Cholecalciferol 25 MCG (1000 UT) tablet, Take 1 tablet by mouth daily., Disp: , Rfl:    dexamethasone (DECADRON) 4 MG tablet, Take 2 tablets (8 mg) by mouth daily x 2 days starting the day after cisplatin chemotherapy. Take with food., Disp: 30 tablet, Rfl: 1   empagliflozin (JARDIANCE) 10 MG TABS tablet, Take by mouth., Disp: , Rfl:    gabapentin (NEURONTIN) 300 MG capsule, TAKE 1 CAPSULE BY MOUTH TWO TIMES A DAY, Disp: , Rfl:    lisinopril (PRINIVIL,ZESTRIL) 5 MG tablet, Take by mouth. Take 1 tab by mouth once daily, Disp: , Rfl:    metFORMIN (GLUCOPHAGE) 500 MG tablet, Take 500 mg by mouth 2  (two) times daily., Disp: , Rfl:    metoprolol tartrate (LOPRESSOR) 50 MG tablet, Take 50 mg by mouth 2 (two) times daily., Disp: , Rfl:    nystatin (MYCOSTATIN) 100000 UNIT/ML suspension, Take 5 mLs (500,000 Units total) by mouth 4 (four) times daily. Swish and Spit, Disp: 473 mL, Rfl: 1   ondansetron (ZOFRAN) 8 MG tablet, Take 1 tablet (8 mg total) by mouth every 8 (eight) hours as needed for nausea or vomiting. Start on the third day after cisplatin., Disp: 30 tablet, Rfl: 1   oxyCODONE (OXY IR/ROXICODONE) 5 MG immediate release tablet, Take 1 tablet (5 mg total) by mouth every 6 (six) hours as needed for severe pain., Disp: 30 tablet, Rfl: 0   prochlorperazine (COMPAZINE) 10 MG tablet, Take 1 tablet (10 mg total) by mouth every 6 (six) hours as needed (Nausea or vomiting)., Disp: 30 tablet, Rfl: 1   simvastatin (ZOCOR) 40 MG tablet, Take 40 mg by mouth daily., Disp: , Rfl:    sucralfate (CARAFATE) 1 GM/10ML suspension, Take 10 mLs (1 g total) by mouth 4 (four) times daily -  with meals and at bedtime., Disp: 420 mL, Rfl: 0   vitamin B-12 (CYANOCOBALAMIN) 1000 MCG tablet, Take 1,000 mcg by mouth daily., Disp: , Rfl:  No current facility-administered medications for this visit.  Facility-Administered Medications Ordered in Other Visits:  0.9 %  sodium chloride infusion, , Intravenous, Continuous, Omare Bilotta M, PA-C, Stopped at 12/31/22 1122  Allergies: No Known Allergies  Past Medical History, Surgical history, Social history, and Family History were reviewed and updated.  Review of Systems: As stated above   Physical Exam:  weight is 176 lb (79.8 kg). His tympanic temperature is 97 F (36.1 C) (abnormal). His blood pressure is 109/78 and his pulse is 89.   Physical Exam General: NAD HEENT: Patient is able to open his mouth about 1 inch.  Cardiovascular: regular rate and rhythm Pulmonary: clear ant fields Abdomen: soft, nontender, + bowel sounds GU: no suprapubic  tenderness Extremities: no edema, no joint deformities Skin: no rashes Neurological: Weakness but otherwise nonfocal   Lab Results  Component Value Date   WBC 9.2 12/31/2022   HGB 14.2 12/31/2022   HCT 41.2 12/31/2022   MCV 92.6 12/31/2022   PLT 158 12/31/2022     Chemistry      Component Value Date/Time   NA 132 (L) 12/31/2022 0915   K 3.8 12/31/2022 0915   CL 96 (L) 12/31/2022 0915   CO2 18 (L) 12/31/2022 0915   BUN 24 (H) 12/31/2022 0915   CREATININE 1.00 12/31/2022 0915      Component Value Date/Time   CALCIUM 8.8 (L) 12/31/2022 0915   ALKPHOS 65 12/31/2022 0915   AST 38 12/31/2022 0915   ALT 26 12/31/2022 0915   BILITOT 1.5 (H) 12/31/2022 0915      Assessment and Plan- Patient is a 72 y.o. male    Encounter Diagnoses  Name Primary?   Oropharyngeal cancer (HCC) Yes   Weakness    Dehydration    Trismus    Oropharyngeal cancer (HCC) Staging form: Oral Cavity, AJCC 8th Edition - Clinical: Stage IVA (cT3, cN2a, cM0)   Symptoms are stable. He is able to eat/drink limited items. We discussed other options such as yogurt based smoothies, carnation instant breakfast. Given that his feeding tube is being placed Thursday (no earlier time available) we discussed admission to the hospital to prevent failure to thrive, loss of condition, and refeeding syndrome. At this time he declines. He would like to try to increase his oral intake. Reviewed aspiration red flags and precautions. Reviewed case with Dr. Cathie Hoops. He will receive 1L IVF today to help with hydration and we will see him Monday to ensure symptoms have not worsened and that electrolytes are stable. He may need IVF at this time to help with hydration. Should his symptoms worsen he will go to the ER.  Disposition: 1L IVF today RTC Monday APP, labs(CBC, CMP, phosphorus), IVF   Clent Jacks PA-C 5/10/20241:33 PM

## 2022-12-31 NOTE — Assessment & Plan Note (Signed)
Recommend nystatin mouth rinse swish and spit.  

## 2022-12-31 NOTE — Assessment & Plan Note (Signed)
oxycodone 5mg Q6h PRN pain.   

## 2022-12-31 NOTE — Assessment & Plan Note (Signed)
[facility] stopped his Xarelto 10 mg. Continue aspirin 81 mg daily for history of CAD status post percutaneous coronary angioplasty. 

## 2022-12-31 NOTE — Assessment & Plan Note (Signed)
Barium swallow indicates aspiration.  Speech swallow therapist recommend NPO and PEG placement.  Outpatient PEG is schedule late next week, however he is getting weaker due to limited oral intake. Will continue supportive.  Recommend admission to hospital for continuous hydration and inpatient PEG placement.

## 2022-12-31 NOTE — Progress Notes (Signed)
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  Labs are reviewed and discussed with patient. Hold  cisplatin 40mg /m2 today due to weakness. Discussed with Radonc will hold off RT   History of DVT (deep vein thrombosis) [facility] stopped his Xarelto 10 mg. Continue aspirin 81 mg daily for history of CAD status post percutaneous coronary angioplasty.  Neoplasm related pain  oxycodone 5mg  Q6h PRN pain.    Thrush Recommend nystatin mouth rinse swish and spit.   Trismus Barium swallow indicates aspiration.  Speech swallow therapist recommend NPO and PEG placement.  Outpatient PEG is schedule late next week, however he is getting weaker due to limited oral intake. Will continue supportive.  Recommend admission to hospital for continuous hydration and inpatient PEG placement.     Orders Placed This Encounter  Procedures   DG Chest 2 View    Standing Status:   Future    Number of Occurrences:   1    Standing Expiration Date:   12/30/2023    Order Specific Question:   Reason for Exam (SYMPTOM  OR DIAGNOSIS REQUIRED)    Answer:   aspiration, weakness    Order Specific Question:   Preferred imaging location?    Answer:   [facility]   Follow-up 1 week lab MD cisplatin.  All questions were answered. The patient knows to call the clinic with any problems, questions or concerns.  [name], MD, PhD [facility] Hematology Oncology 12/30/2022   HISTORY OF PRESENTING ILLNESS:  Patient presented outpatient ultrasound of his left lower extremity showing  positive for DVT. Denies any recent,, surgery, travel or issue.  Denies any family history of DVT.  Reports that he developed left lower extremity swelling for a week, associated with moderate severity throbbing aching discomfort feels like pulling a muscle.  Denies any shortness of breath. Patient was started on Xarelto starting kit and advised to follow-up with heme-onc.  he take Xarelto 10mg  daily. Also on Aspirin 81mg  daily. Tolerates well.Denies hematochezia, hematuria, hematemesis, epistaxis, black tarry stool or easy bruising.   INTERVAL HISTORY Kenneth Gilmore is a 72 y.o. male presents to re-establish care for oropharyngeal squamous cell carcinoma  Oncology History  Oropharyngeal cancer (HCC)  09/28/2022 Imaging   CT soft tissue neck with contrast showed infiltrating mass in the right tongue with multiple ipsilateral nodal metastases. The primary mass measures at least 4 cm and infiltrates parapharyngeal fat, visualization of tumor extent is limited by artifact from dental amalgam.   10/28/2022 Initial Diagnosis   Squamous cell carcinoma of tongue (HCC) Patient has throat pain and was evaluated by ENT Dr.Bennett.  Physical examination showed cervical lymphadenopathy.  10/15/2022 ultrasound-guided biopsy of right cervical lymph node showed fibrous tissue, detach minute salivary gland tissue.  Granular debris's and a few dysplastic squamous epithelial cells, nondiagnostic.  10/22/2022 repeat ultrasound-guided biopsy of right cervical lymph node was positive for malignancy, metastatic squamous cell carcinoma, keratinizing, in a background of dense fibrosis and tumor necrosis.   10/28/2022 Cancer Staging   Staging form: Oral Cavity, AJCC 8th Edition - Clinical: Stage IVA (cT3, cN2a, cM0) -  Signed by [name], MD on 11/02/2022   11/16/2022 Imaging   PET scan showed 1. Large infiltrating right oropharyngeal mass is markedly hypermetabolic and consistent with known squamous cell carcinoma. 2.  Bilateral hypermetabolic metastatic cervical lymphadenopathy. 3. No evidence of metastatic disease involving the chest, abdomen/pelvis or bony structures.     12/09/2022 -  Chemotherapy   Patient is on Treatment Plan : HEAD/NECK Cisplatin (40) q7d     He uses dipping tobacco and tries to quit. He reports mild swallowing difficulty, weakness, no appetite. No fever chills.  Xray today showed no infiltrates  Right ear pain, right ear sharp pain is intermittent, pian is better after taking oxycodone.  Tongue pain as well.  He is off Xarelto 10mg  per recommendation of his [place] provider.     Review of Systems  Constitutional:  Negative for chills, fever, malaise/fatigue and weight loss.  HENT:  Negative for sore throat.        Sore throat, right ear pain  Eyes:  Negative for redness.  Respiratory:  Negative for cough, shortness of breath and wheezing.   Cardiovascular:  Negative for chest pain and palpitations.       Trace edema bilaterally  Gastrointestinal:  Negative for abdominal pain, blood in stool, nausea and vomiting.  Genitourinary:  Negative for dysuria.  Musculoskeletal:  Negative for myalgias.  Skin:  Negative for rash.  Neurological:  Negative for dizziness, tingling and tremors.  Endo/Heme/Allergies:  Does not bruise/bleed easily.  Psychiatric/Behavioral:  Negative for hallucinations.     MEDICAL HISTORY:  Past Medical History:  Diagnosis Date   Diabetes mellitus without complication (HCC)    Hyperlipidemia    Hypertension    Oropharyngeal cancer (HCC)     SURGICAL HISTORY: Past Surgical History:  Procedure Laterality Date   arm surgery Left    CORONARY ANGIOPLASTY WITH STENT PLACEMENT     left side rotator cuff surgery     SHOULDER SURGERY      SOCIAL HISTORY: Social History   Socioeconomic History   Marital status: Single    Spouse name: Not on file   Number of children: Not on file   Years of education: Not on file   Highest education level: Not on  file  Occupational History   Occupation: retired  Tobacco Use   Smoking status: Never   Smokeless tobacco: Current    Types: Chew  Substance and Sexual Activity   Alcohol use: No   Drug use: Never   Sexual activity: Not on file  Other Topics Concern   Not on file  Social History Narrative   Not on file   Social Determinants of Health   Financial Resource Strain: Low Risk  (12/27/2022)   Overall Financial Resource Strain (CARDIA)    Difficulty of Paying Living Expenses: Not hard at all  Food Insecurity: No Food Insecurity (12/27/2022)   Hunger Vital Sign    Worried About Running Out of Food in the Last Year: Never true    Ran Out of Food in the Last Year: Never true  Transportation Needs: No Transportation Needs (12/27/2022)   PRAPARE - Administrator, Civil Service (Medical): No    Lack of Transportation (Non-Medical): No  Physical Activity: Not on file  Stress: Stress Concern Present (12/27/2022)   [employer] of Occupational Health - Occupational Stress Questionnaire    Feeling of Stress : To some extent  Social Connections: Moderately Integrated (12/27/2022)   Social Connection and Isolation Panel [NHANES]  Frequency of Communication with Friends and Family: More than three times a week    Frequency of Social Gatherings with Friends and Family: More than three times a week    Attends Religious Services: More than 4 times per year    Active Member of [employer] or Organizations: Yes    Attends Banker Meetings: More than 4 times per year    Marital Status: Never married  Intimate Partner Violence: Not At Risk (12/27/2022)   Humiliation, Afraid, Rape, and Kick questionnaire    Fear of Current or Ex-Partner: No    Emotionally Abused: No    Physically Abused: No    Sexually Abused: No    FAMILY HISTORY: Family History  Problem Relation Age of Onset   Cancer Father        lung    ALLERGIES:  has No Known Allergies.  MEDICATIONS:  Current  Outpatient Medications  Medication Sig Dispense Refill   aspirin 81 MG tablet Take 81 mg by mouth daily.     atorvastatin (LIPITOR) 80 MG tablet TAKE ONE-HALF TABLET BY MOUTH AT BEDTIME FOR HIGH CHOLESTEROL REPLACES SIMVASTATIN     Cholecalciferol 25 MCG (1000 UT) tablet Take 1 tablet by mouth daily.     dexamethasone (DECADRON) 4 MG tablet Take 2 tablets (8 mg) by mouth daily x 2 days starting the day after cisplatin chemotherapy. Take with food. 30 tablet 1   empagliflozin (JARDIANCE) 10 MG TABS tablet Take by mouth.     gabapentin (NEURONTIN) 300 MG capsule TAKE 1 CAPSULE BY MOUTH TWO TIMES A DAY     lisinopril (PRINIVIL,ZESTRIL) 5 MG tablet Take by mouth. Take 1 tab by mouth once daily     metFORMIN (GLUCOPHAGE) 500 MG tablet Take 500 mg by mouth 2 (two) times daily.     metoprolol tartrate (LOPRESSOR) 50 MG tablet Take 50 mg by mouth 2 (two) times daily.     nystatin (MYCOSTATIN) 100000 UNIT/ML suspension Take 5 mLs (500,000 Units total) by mouth 4 (four) times daily. Swish and Spit 473 mL 1   ondansetron (ZOFRAN) 8 MG tablet Take 1 tablet (8 mg total) by mouth every 8 (eight) hours as needed for nausea or vomiting. Start on the third day after cisplatin. 30 tablet 1   oxyCODONE (OXY IR/ROXICODONE) 5 MG immediate release tablet Take 1 tablet (5 mg total) by mouth every 6 (six) hours as needed for severe pain. 30 tablet 0   prochlorperazine (COMPAZINE) 10 MG tablet Take 1 tablet (10 mg total) by mouth every 6 (six) hours as needed (Nausea or vomiting). 30 tablet 1   simvastatin (ZOCOR) 40 MG tablet Take 40 mg by mouth daily.     sucralfate (CARAFATE) 1 GM/10ML suspension Take 10 mLs (1 g total) by mouth 4 (four) times daily -  with meals and at bedtime. 420 mL 0   vitamin B-12 (CYANOCOBALAMIN) 1000 MCG tablet Take 1,000 mcg by mouth daily.     No current facility-administered medications for this visit.     PHYSICAL EXAMINATION: ECOG PERFORMANCE STATUS: 1 - Symptomatic but completely  ambulatory Vitals:   12/30/22 0841  BP: 99/64  Pulse: 66  Resp: 18  Temp: (!) 96 F (35.6 C)  SpO2: 99%   Filed Weights   12/30/22 0841  Weight: 177 lb 3.2 oz (80.4 kg)    Physical Exam Constitutional:      General: He is not in acute distress. HENT:     Head: Normocephalic and atraumatic.  Mouth/Throat:     Comments: Restricted mouth opening/trismus Thrush Eyes:     General: No scleral icterus. Cardiovascular:     Rate and Rhythm: Normal rate.  Pulmonary:     Effort: Pulmonary effort is normal. No respiratory distress.     Breath sounds: Normal breath sounds. No wheezing.  Abdominal:     General: Bowel sounds are normal. There is no distension.     Palpations: Abdomen is soft.  Musculoskeletal:        General: No swelling. Normal range of motion.     Cervical back: Normal range of motion and neck supple.  Lymphadenopathy:     Cervical: Cervical adenopathy present.  Skin:    General: Skin is warm and dry.     Findings: No erythema or rash.  Neurological:     Mental Status: He is alert and oriented to person, place, and time. Mental status is at baseline.     Cranial Nerves: No cranial nerve deficit.     Coordination: Coordination normal.  Psychiatric:        Mood and Affect: Mood normal.      LABORATORY DATA:  I have reviewed the data as listed     Latest Ref Rng & Units 12/31/2022    9:15 AM 12/30/2022    8:12 AM 12/28/2022    1:38 PM  CBC  WBC 4.0 - 10.5 K/uL 9.2  12.4  8.4   Hemoglobin 13.0 - 17.0 g/dL 16.1  09.6  04.5   Hematocrit 39.0 - 52.0 % 41.2  45.0  42.0   Platelets 150 - 400 K/uL 158  182  199       Latest Ref Rng & Units 12/30/2022    8:12 AM 12/28/2022    1:38 PM 12/23/2022    8:01 AM  CMP  Glucose 70 - 99 mg/dL 409  811  914   BUN 8 - 23 mg/dL 25  22  16    Creatinine 0.61 - 1.24 mg/dL 7.82  9.56  2.13   Sodium 135 - 145 mmol/L 133  134  134   Potassium 3.5 - 5.1 mmol/L 5.0  5.1  4.5   Chloride 98 - 111 mmol/L 95  93  95   CO2 22 - 32  mmol/L 18  26  27    Calcium 8.9 - 10.3 mg/dL 9.3  9.3  9.4   Total Protein 6.5 - 8.1 g/dL 7.6   7.7   Total Bilirubin 0.3 - 1.2 mg/dL 1.8   0.7   Alkaline Phos 38 - 126 U/L 76   68   AST 15 - 41 U/L 62   23   ALT 0 - 44 U/L 31   20

## 2023-01-03 ENCOUNTER — Other Ambulatory Visit: Payer: Self-pay

## 2023-01-03 ENCOUNTER — Inpatient Hospital Stay (HOSPITAL_COMMUNITY)
Admit: 2023-01-03 | Discharge: 2023-01-03 | Disposition: A | Discharge status: Home / Self Care | Payer: Medicare Other | Attending: Internal Medicine | Admitting: Internal Medicine

## 2023-01-03 ENCOUNTER — Encounter: Payer: Self-pay | Admitting: Hospice and Palliative Medicine

## 2023-01-03 ENCOUNTER — Emergency Department: Payer: Medicare Other

## 2023-01-03 ENCOUNTER — Encounter: Payer: Self-pay | Admitting: Internal Medicine

## 2023-01-03 ENCOUNTER — Ambulatory Visit: Payer: Medicare Other | Admitting: Speech Pathology

## 2023-01-03 ENCOUNTER — Inpatient Hospital Stay
Admission: EM | Admit: 2023-01-03 | Discharge: 2023-01-11 | DRG: 321 | Disposition: A | Discharge status: Home / Self Care | Payer: Medicare Other | Attending: Internal Medicine | Admitting: Internal Medicine

## 2023-01-03 ENCOUNTER — Inpatient Hospital Stay: Payer: Medicare Other

## 2023-01-03 ENCOUNTER — Ambulatory Visit: Payer: Medicare Other

## 2023-01-03 ENCOUNTER — Inpatient Hospital Stay (HOSPITAL_BASED_OUTPATIENT_CLINIC_OR_DEPARTMENT_OTHER): Payer: Medicare Other | Admitting: Hospice and Palliative Medicine

## 2023-01-03 VITALS — BP 105/67 | HR 62 | Temp 97.9°F | Resp 18 | Ht 69.0 in | Wt 172.6 lb

## 2023-01-03 DIAGNOSIS — I513 Intracardiac thrombosis, not elsewhere classified: Secondary | ICD-10-CM | POA: Diagnosis not present

## 2023-01-03 DIAGNOSIS — I4430 Unspecified atrioventricular block: Secondary | ICD-10-CM | POA: Diagnosis present

## 2023-01-03 DIAGNOSIS — E119 Type 2 diabetes mellitus without complications: Secondary | ICD-10-CM | POA: Diagnosis not present

## 2023-01-03 DIAGNOSIS — E639 Nutritional deficiency, unspecified: Secondary | ICD-10-CM | POA: Diagnosis not present

## 2023-01-03 DIAGNOSIS — E785 Hyperlipidemia, unspecified: Secondary | ICD-10-CM | POA: Diagnosis not present

## 2023-01-03 DIAGNOSIS — Z86718 Personal history of other venous thrombosis and embolism: Secondary | ICD-10-CM | POA: Diagnosis not present

## 2023-01-03 DIAGNOSIS — I5042 Chronic combined systolic (congestive) and diastolic (congestive) heart failure: Secondary | ICD-10-CM | POA: Diagnosis present

## 2023-01-03 DIAGNOSIS — Z955 Presence of coronary angioplasty implant and graft: Secondary | ICD-10-CM

## 2023-01-03 DIAGNOSIS — I21A9 Other myocardial infarction type: Secondary | ICD-10-CM | POA: Diagnosis present

## 2023-01-03 DIAGNOSIS — Z7901 Long term (current) use of anticoagulants: Secondary | ICD-10-CM

## 2023-01-03 DIAGNOSIS — Z79899 Other long term (current) drug therapy: Secondary | ICD-10-CM | POA: Diagnosis not present

## 2023-01-03 DIAGNOSIS — E876 Hypokalemia: Secondary | ICD-10-CM | POA: Diagnosis not present

## 2023-01-03 DIAGNOSIS — Y831 Surgical operation with implant of artificial internal device as the cause of abnormal reaction of the patient, or of later complication, without mention of misadventure at the time of the procedure: Secondary | ICD-10-CM | POA: Diagnosis present

## 2023-01-03 DIAGNOSIS — C109 Malignant neoplasm of oropharynx, unspecified: Secondary | ICD-10-CM | POA: Diagnosis not present

## 2023-01-03 DIAGNOSIS — Z5111 Encounter for antineoplastic chemotherapy: Secondary | ICD-10-CM | POA: Diagnosis not present

## 2023-01-03 DIAGNOSIS — E860 Dehydration: Secondary | ICD-10-CM | POA: Diagnosis present

## 2023-01-03 DIAGNOSIS — D75839 Thrombocytosis, unspecified: Secondary | ICD-10-CM | POA: Insufficient documentation

## 2023-01-03 DIAGNOSIS — Z7982 Long term (current) use of aspirin: Secondary | ICD-10-CM

## 2023-01-03 DIAGNOSIS — R531 Weakness: Secondary | ICD-10-CM | POA: Diagnosis not present

## 2023-01-03 DIAGNOSIS — R1310 Dysphagia, unspecified: Secondary | ICD-10-CM

## 2023-01-03 DIAGNOSIS — C770 Secondary and unspecified malignant neoplasm of lymph nodes of head, face and neck: Secondary | ICD-10-CM | POA: Diagnosis present

## 2023-01-03 DIAGNOSIS — Z7984 Long term (current) use of oral hypoglycemic drugs: Secondary | ICD-10-CM | POA: Diagnosis not present

## 2023-01-03 DIAGNOSIS — K1230 Oral mucositis (ulcerative), unspecified: Secondary | ICD-10-CM | POA: Diagnosis present

## 2023-01-03 DIAGNOSIS — I110 Hypertensive heart disease with heart failure: Secondary | ICD-10-CM | POA: Diagnosis not present

## 2023-01-03 DIAGNOSIS — I4892 Unspecified atrial flutter: Principal | ICD-10-CM | POA: Insufficient documentation

## 2023-01-03 DIAGNOSIS — I10 Essential (primary) hypertension: Secondary | ICD-10-CM

## 2023-01-03 DIAGNOSIS — Z515 Encounter for palliative care: Secondary | ICD-10-CM | POA: Diagnosis not present

## 2023-01-03 DIAGNOSIS — R55 Syncope and collapse: Secondary | ICD-10-CM | POA: Insufficient documentation

## 2023-01-03 DIAGNOSIS — E43 Unspecified severe protein-calorie malnutrition: Secondary | ICD-10-CM | POA: Insufficient documentation

## 2023-01-03 DIAGNOSIS — I483 Typical atrial flutter: Secondary | ICD-10-CM | POA: Diagnosis not present

## 2023-01-03 DIAGNOSIS — I472 Ventricular tachycardia, unspecified: Secondary | ICD-10-CM | POA: Diagnosis present

## 2023-01-03 DIAGNOSIS — R1312 Dysphagia, oropharyngeal phase: Secondary | ICD-10-CM | POA: Diagnosis not present

## 2023-01-03 DIAGNOSIS — I420 Dilated cardiomyopathy: Secondary | ICD-10-CM | POA: Diagnosis not present

## 2023-01-03 DIAGNOSIS — I959 Hypotension, unspecified: Secondary | ICD-10-CM | POA: Insufficient documentation

## 2023-01-03 DIAGNOSIS — Z431 Encounter for attention to gastrostomy: Secondary | ICD-10-CM | POA: Diagnosis not present

## 2023-01-03 DIAGNOSIS — Z923 Personal history of irradiation: Secondary | ICD-10-CM

## 2023-01-03 DIAGNOSIS — S0990XA Unspecified injury of head, initial encounter: Secondary | ICD-10-CM | POA: Diagnosis not present

## 2023-01-03 DIAGNOSIS — E46 Unspecified protein-calorie malnutrition: Secondary | ICD-10-CM | POA: Diagnosis not present

## 2023-01-03 DIAGNOSIS — I5022 Chronic systolic (congestive) heart failure: Secondary | ICD-10-CM | POA: Insufficient documentation

## 2023-01-03 DIAGNOSIS — I2510 Atherosclerotic heart disease of native coronary artery without angina pectoris: Secondary | ICD-10-CM | POA: Diagnosis present

## 2023-01-03 DIAGNOSIS — Z510 Encounter for antineoplastic radiation therapy: Secondary | ICD-10-CM | POA: Diagnosis not present

## 2023-01-03 DIAGNOSIS — R64 Cachexia: Secondary | ICD-10-CM | POA: Diagnosis present

## 2023-01-03 DIAGNOSIS — I214 Non-ST elevation (NSTEMI) myocardial infarction: Secondary | ICD-10-CM | POA: Diagnosis present

## 2023-01-03 DIAGNOSIS — Z6825 Body mass index (BMI) 25.0-25.9, adult: Secondary | ICD-10-CM

## 2023-01-03 DIAGNOSIS — D61818 Other pancytopenia: Secondary | ICD-10-CM | POA: Diagnosis not present

## 2023-01-03 DIAGNOSIS — G893 Neoplasm related pain (acute) (chronic): Secondary | ICD-10-CM | POA: Diagnosis not present

## 2023-01-03 DIAGNOSIS — G250 Essential tremor: Secondary | ICD-10-CM | POA: Insufficient documentation

## 2023-01-03 DIAGNOSIS — S199XXA Unspecified injury of neck, initial encounter: Secondary | ICD-10-CM | POA: Diagnosis not present

## 2023-01-03 DIAGNOSIS — F17220 Nicotine dependence, chewing tobacco, uncomplicated: Secondary | ICD-10-CM | POA: Diagnosis not present

## 2023-01-03 DIAGNOSIS — C108 Malignant neoplasm of overlapping sites of oropharynx: Secondary | ICD-10-CM | POA: Diagnosis not present

## 2023-01-03 DIAGNOSIS — Z9221 Personal history of antineoplastic chemotherapy: Secondary | ICD-10-CM

## 2023-01-03 DIAGNOSIS — T82855A Stenosis of coronary artery stent, initial encounter: Secondary | ICD-10-CM | POA: Diagnosis not present

## 2023-01-03 LAB — CMP (CANCER CENTER ONLY)
ALT: 19 U/L (ref 0–44)
AST: 24 U/L (ref 15–41)
Albumin: 3.4 g/dL — ABNORMAL LOW (ref 3.5–5.0)
Alkaline Phosphatase: 68 U/L (ref 38–126)
Anion gap: 17 — ABNORMAL HIGH (ref 5–15)
BUN: 21 mg/dL (ref 8–23)
CO2: 22 mmol/L (ref 22–32)
Calcium: 8.9 mg/dL (ref 8.9–10.3)
Chloride: 98 mmol/L (ref 98–111)
Creatinine: 0.87 mg/dL (ref 0.61–1.24)
GFR, Estimated: >60 mL/min (ref >60)
Glucose, Bld: 172 mg/dL — ABNORMAL HIGH (ref 70–99)
Potassium: 3.7 mmol/L (ref 3.5–5.1)
Sodium: 137 mmol/L (ref 135–145)
Total Bilirubin: 1.4 mg/dL — ABNORMAL HIGH (ref 0.3–1.2)
Total Protein: 6.7 g/dL (ref 6.5–8.1)

## 2023-01-03 LAB — CBC WITH DIFFERENTIAL (CANCER CENTER ONLY)
Abs Immature Granulocytes: 0.03 10*3/uL (ref 0.00–0.07)
Basophils Absolute: 0 10*3/uL (ref 0.0–0.1)
Basophils Relative: 0 %
Eosinophils Absolute: 0 10*3/uL (ref 0.0–0.5)
Eosinophils Relative: 0 %
HCT: 40.3 % (ref 39.0–52.0)
Hemoglobin: 14.1 g/dL (ref 13.0–17.0)
Immature Granulocytes: 0 %
Lymphocytes Relative: 9 %
Lymphs Abs: 0.6 10*3/uL — ABNORMAL LOW (ref 0.7–4.0)
MCH: 31.8 pg (ref 26.0–34.0)
MCHC: 35 g/dL (ref 30.0–36.0)
MCV: 91 fL (ref 80.0–100.0)
Monocytes Absolute: 0.8 10*3/uL (ref 0.1–1.0)
Monocytes Relative: 10 %
Neutro Abs: 5.8 10*3/uL (ref 1.7–7.7)
Neutrophils Relative %: 81 %
Platelet Count: 111 10*3/uL — ABNORMAL LOW (ref 150–400)
RBC: 4.43 MIL/uL (ref 4.22–5.81)
RDW: 12.1 % (ref 11.5–15.5)
WBC Count: 7.2 10*3/uL (ref 4.0–10.5)
nRBC: 0 % (ref 0.0–0.2)

## 2023-01-03 LAB — CBC
HCT: 42.6 % (ref 39.0–52.0)
Hemoglobin: 14.9 g/dL (ref 13.0–17.0)
MCH: 32 pg (ref 26.0–34.0)
MCHC: 35 g/dL (ref 30.0–36.0)
MCV: 91.4 fL (ref 80.0–100.0)
Platelets: 129 10*3/uL — ABNORMAL LOW (ref 150–400)
RBC: 4.66 MIL/uL (ref 4.22–5.81)
RDW: 12.3 % (ref 11.5–15.5)
WBC: 7.5 10*3/uL (ref 4.0–10.5)
nRBC: 0 % (ref 0.0–0.2)

## 2023-01-03 LAB — CBG MONITORING, ED: Glucose-Capillary: 133 mg/dL — ABNORMAL HIGH (ref 70–99)

## 2023-01-03 LAB — BASIC METABOLIC PANEL
Anion gap: 18 — ABNORMAL HIGH (ref 5–15)
BUN: 22 mg/dL (ref 8–23)
CO2: 21 mmol/L — ABNORMAL LOW (ref 22–32)
Calcium: 9.2 mg/dL (ref 8.9–10.3)
Chloride: 98 mmol/L (ref 98–111)
Creatinine, Ser: 0.92 mg/dL (ref 0.61–1.24)
GFR, Estimated: >60 mL/min (ref >60)
Glucose, Bld: 179 mg/dL — ABNORMAL HIGH (ref 70–99)
Potassium: 4.3 mmol/L (ref 3.5–5.1)
Sodium: 137 mmol/L (ref 135–145)

## 2023-01-03 LAB — MAGNESIUM
Magnesium: 1.9 mg/dL (ref 1.7–2.4)
Magnesium: 2.1 mg/dL (ref 1.7–2.4)

## 2023-01-03 LAB — TROPONIN I (HIGH SENSITIVITY)
Troponin I (High Sensitivity): 2925 ng/L (ref <18)
Troponin I (High Sensitivity): 3672 ng/L (ref <18)

## 2023-01-03 LAB — PROTIME-INR
INR: 1.3 — ABNORMAL HIGH (ref 0.8–1.2)
Prothrombin Time: 15.9 seconds — ABNORMAL HIGH (ref 11.4–15.2)

## 2023-01-03 LAB — HEPARIN LEVEL (UNFRACTIONATED): Heparin Unfractionated: <0.1 IU/mL — ABNORMAL LOW (ref 0.30–0.70)

## 2023-01-03 LAB — ECHOCARDIOGRAM COMPLETE

## 2023-01-03 LAB — APTT: aPTT: 24 seconds (ref 24–36)

## 2023-01-03 MED ORDER — ACETAMINOPHEN 325 MG PO TABS: 650.0000 mg | ORAL_TABLET | Freq: Four times a day (QID) | ORAL | Status: DC | PRN

## 2023-01-03 MED ORDER — ONDANSETRON HCL 4 MG/2ML IJ SOLN: 4.0000 mg | Freq: Four times a day (QID) | INTRAMUSCULAR | Status: AC | PRN

## 2023-01-03 MED ORDER — INSULIN ASPART 100 UNIT/ML IJ SOLN
0.0000 [IU] | Freq: Three times a day (TID) | INTRAMUSCULAR | Status: DC
  Administered 2023-01-04: 1 [IU] via SUBCUTANEOUS
  Filled 2023-01-03 (×2): qty 1

## 2023-01-03 MED ORDER — SODIUM CHLORIDE 0.9 % IV BOLUS
500.0000 mL | Freq: Once | INTRAVENOUS | Status: AC
  Administered 2023-01-03: 500 mL via INTRAVENOUS

## 2023-01-03 MED ORDER — HEPARIN (PORCINE) 25000 UT/250ML-% IV SOLN: 12.0000 [IU]/kg/h | INTRAVENOUS | Status: DC

## 2023-01-03 MED ORDER — HEPARIN (PORCINE) 25000 UT/250ML-% IV SOLN
1100.0000 [IU]/h | INTRAVENOUS | Status: DC
  Administered 2023-01-03: 900 [IU]/h via INTRAVENOUS
  Administered 2023-01-04: 1200 [IU]/h via INTRAVENOUS
  Administered 2023-01-06 – 2023-01-07 (×2): 1100 [IU]/h via INTRAVENOUS
  Filled 2023-01-03 (×6): qty 250

## 2023-01-03 MED ORDER — FENTANYL CITRATE PF 50 MCG/ML IJ SOSY: 12.5000 ug | PREFILLED_SYRINGE | INTRAMUSCULAR | Status: AC | PRN

## 2023-01-03 MED ORDER — ASPIRIN 325 MG PO TABS
325.0000 mg | ORAL_TABLET | Freq: Every day | ORAL | Status: DC
  Administered 2023-01-03: 325 mg via ORAL
  Filled 2023-01-03: qty 1

## 2023-01-03 MED ORDER — SENNOSIDES-DOCUSATE SODIUM 8.6-50 MG PO TABS: 1.0000 | ORAL_TABLET | Freq: Every evening | ORAL | Status: DC | PRN

## 2023-01-03 MED ORDER — INSULIN ASPART 100 UNIT/ML IJ SOLN
0.0000 [IU] | Freq: Every day | INTRAMUSCULAR | Status: DC
  Administered 2023-01-03: 1 [IU] via SUBCUTANEOUS

## 2023-01-03 MED ORDER — PERFLUTREN LIPID MICROSPHERE
1.0000 mL | INTRAVENOUS | Status: AC | PRN
  Administered 2023-01-03: 2 mL via INTRAVENOUS

## 2023-01-03 MED ORDER — ACETAMINOPHEN 10 MG/ML IV SOLN: 1000.0000 mg | Freq: Four times a day (QID) | INTRAVENOUS | Status: AC | PRN

## 2023-01-03 MED ORDER — ONDANSETRON HCL 4 MG PO TABS: 4.0000 mg | ORAL_TABLET | Freq: Four times a day (QID) | ORAL | Status: AC | PRN

## 2023-01-03 MED ORDER — DILTIAZEM HCL-DEXTROSE 125-5 MG/125ML-% IV SOLN (PREMIX)
5.0000 mg/h | INTRAVENOUS | Status: DC
  Administered 2023-01-03 – 2023-01-04 (×2): 5 mg/h via INTRAVENOUS
  Filled 2023-01-03 (×2): qty 125

## 2023-01-03 MED ORDER — HEPARIN BOLUS VIA INFUSION
4000.0000 [IU] | Freq: Once | INTRAVENOUS | Status: AC
  Administered 2023-01-03: 4000 [IU] via INTRAVENOUS
  Filled 2023-01-03: qty 4000

## 2023-01-03 MED ORDER — ACETAMINOPHEN 325 MG RE SUPP: 650.0000 mg | Freq: Four times a day (QID) | RECTAL | Status: DC | PRN

## 2023-01-03 MED ORDER — MORPHINE SULFATE (PF) 2 MG/ML IV SOLN: 2.0000 mg | INTRAVENOUS | Status: AC | PRN

## 2023-01-03 MED ORDER — DILTIAZEM LOAD VIA INFUSION
15.0000 mg | Freq: Once | INTRAVENOUS | Status: AC
  Administered 2023-01-03: 15 mg via INTRAVENOUS
  Filled 2023-01-03: qty 15

## 2023-01-03 MED ORDER — SODIUM CHLORIDE 0.9 % IV SOLN: INTRAVENOUS | Status: DC

## 2023-01-03 NOTE — Assessment & Plan Note (Signed)
Patient is on gabapentin 300 mg in the morning and 300 mg at night per neurology

## 2023-01-03 NOTE — Assessment & Plan Note (Addendum)
New diagnosis Continue diltiazem GGT

## 2023-01-03 NOTE — Assessment & Plan Note (Signed)
Insulin ssi with HS coverage Goal inpatient blood glucose levels 140-180

## 2023-01-03 NOTE — Assessment & Plan Note (Addendum)
Presumed secondary to poor p.o. intake for several months Sodium chloride 500 mL bolus one-time dose ordered Sodium chloride infusion at 125 mL/h, 1 day ordered

## 2023-01-03 NOTE — Assessment & Plan Note (Signed)
Patient is a send he has not taken any of his oral medications for at least 1 week due to difficulty swallowing anything beyond fluid as it will not go down

## 2023-01-03 NOTE — Assessment & Plan Note (Addendum)
Continue heparin GGT, complete echo ordered Cardiology has been consulted by EDP, appreciate input and recommendations We will continue to trend high sensitive troponin Inpatient admission, PCU

## 2023-01-03 NOTE — Assessment & Plan Note (Addendum)
Presumed multifactorial in setting of stage IV oropharyngeal cancer along with mechanical block of esophagus Dietitian has been consulted Patient endorses that he would like G-tube/PEG tube Gastroenterology has been consulted to Dr. Tobi Bastos via secure chat and Epic order

## 2023-01-03 NOTE — ED Notes (Addendum)
Pt reports passing out while in the shower, but woke up as soon as he hit the floor. Pt denies hitting head. Pt reports trouble eating for about 3 months and states it's getting worse due to the recent throat cancer diagnosis.

## 2023-01-03 NOTE — Hospital Course (Addendum)
Mr. Kenneth Gilmore is a 72 year old male with non-insulin-dependent diabetes mellitus, hyperlipidemia, history of DVT on Xarelto, oropharyngeal squamous cell carcinoma stage IVa, who presents emergency department from oncology clinic for chief concerns of syncope event at home while he was in the shower. High sensitive troponin is 2925.  Telemetry showed atrial flutter, he was initially given diltiazem gtt., and quickly converted to sinus. Patient also had a significant dysphagia from oropharyngeal cancer, PEG tube is scheduled. 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

## 2023-01-03 NOTE — Assessment & Plan Note (Signed)
Etiology workup in progress, differentials include NSTEMI, new atrial flutter, versus orthostasis secondary to poor p.o. intake in setting of vasodilation and shower Sodium chloride 500 mL bolus one-time dose ordered Complete echo ordered CT head was read as no acute intracranial abnormality and cervical spine without contrast was read as no evidence of acute cervical spine fracture or subluxation

## 2023-01-03 NOTE — ED Notes (Addendum)
Cbg 133  ECHO being performed at bedside.

## 2023-01-03 NOTE — ED Notes (Signed)
Date and time results received: 01/03/23 1242  (use smartphrase ".now" to insert current time)  Test: Troponin  Critical Value: 2,925  Name of Provider Notified: Dr. Rosalia Hammers  New EKG ordered.

## 2023-01-03 NOTE — ED Notes (Signed)
Pt to CT

## 2023-01-03 NOTE — Progress Notes (Signed)
[facility] at [facility] [phone] [phone]  Patient Care Team: [facility], [facility] as PCP - General (General Practice)   NAME OF PATIENT: Kenneth Gilmore  [id number]  27-Mar-1951   DATE OF VISIT: 01/03/23  REASON FOR CONSULT: JOSEDAVID RAMSBURG is a 72 y.o. male with multiple medical problems including stage IVa oropharyngeal squamous cell carcinoma, history of DVT on Xarelto.  Patient is on concurrent chemoradiation.  INTERVAL HISTORY: Patient with recent evaluation by SLP with concerns of aspiration and dysphagia with recommendation for n.p.o.  He has severe trismus.  He has been seen by GI with plan for PEG.  Patient has required frequent supportive care/IV fluids.  He was seen by Clent Jacks, PA-C last week with discussion about possible hospitalization but patient declined this.  Patient returns to clinic today for labs/supportive care.  Unfortunately, patient tells me that his syncopal event earlier this morning.  He was taking a shower and found himself lying facedown on the bathroom floor.  He comes into the clinic today in a wheelchair.  Patient denies any pain or new symptomatic complaints.  Denies any neurologic complaints. Denies recent fevers or illnesses. Denies any easy bleeding or bruising. Denies chest pain. Denies any nausea, vomiting, constipation, or diarrhea. Denies urinary complaints. Patient offers no further specific complaints today.   PAST MEDICAL HISTORY: Past Medical History:  Diagnosis Date   Diabetes mellitus without complication (HCC)    Hyperlipidemia    Hypertension    Oropharyngeal cancer (HCC)     PAST SURGICAL HISTORY:  Past Surgical History:  Procedure Laterality Date   arm surgery Left    CORONARY ANGIOPLASTY WITH STENT PLACEMENT     left side rotator cuff surgery     SHOULDER SURGERY      HEMATOLOGY/ONCOLOGY HISTORY:  Oncology History  Oropharyngeal  cancer (HCC)  09/28/2022 Imaging   CT soft tissue neck with contrast showed infiltrating mass in the right tongue with multiple ipsilateral nodal metastases. The primary mass measures at least 4 cm and infiltrates parapharyngeal fat, visualization of tumor extent is limited by artifact from dental amalgam.   10/28/2022 Initial Diagnosis   Squamous cell carcinoma of tongue (HCC) Patient has throat pain and was evaluated by ENT Dr.Bennett.  Physical examination showed cervical lymphadenopathy.  10/15/2022 ultrasound-guided biopsy of right cervical lymph node showed fibrous tissue, detach minute salivary gland tissue.  Granular debris's and a few dysplastic squamous epithelial cells, nondiagnostic.  10/22/2022 repeat ultrasound-guided biopsy of right cervical lymph node was positive for malignancy, metastatic squamous cell carcinoma, keratinizing, in a background of dense fibrosis and tumor necrosis.   10/28/2022 Cancer Staging   Staging form: Oral Cavity, AJCC 8th Edition - Clinical: Stage IVA (cT3, cN2a, cM0) - Signed by [name], MD on 11/02/2022   11/16/2022 Imaging   PET scan showed 1. Large infiltrating right oropharyngeal mass is markedly hypermetabolic and consistent with known squamous cell carcinoma. 2. Bilateral hypermetabolic metastatic cervical lymphadenopathy. 3. No evidence of metastatic disease involving the chest, abdomen/pelvis or bony structures.     12/09/2022 -  Chemotherapy   Patient is on Treatment Plan : HEAD/NECK Cisplatin (40) q7d       ALLERGIES:  has No Known Allergies.  MEDICATIONS:  Current Outpatient Medications  Medication Sig Dispense Refill   oxyCODONE (OXY IR/ROXICODONE) 5 MG immediate release tablet Take 1 tablet (5 mg total) by mouth every 6 (six) hours as needed for severe pain. 30 tablet 0  aspirin 81 MG tablet Take 81 mg by mouth daily. (Patient not taking: Reported on 01/03/2023)     atorvastatin (LIPITOR) 80 MG tablet TAKE ONE-HALF TABLET BY MOUTH AT  BEDTIME FOR HIGH CHOLESTEROL REPLACES SIMVASTATIN (Patient not taking: Reported on 01/03/2023)     Cholecalciferol 25 MCG (1000 UT) tablet Take 1 tablet by mouth daily. (Patient not taking: Reported on 01/03/2023)     dexamethasone (DECADRON) 4 MG tablet Take 2 tablets (8 mg) by mouth daily x 2 days starting the day after cisplatin chemotherapy. Take with food. (Patient not taking: Reported on 01/03/2023) 30 tablet 1   empagliflozin (JARDIANCE) 10 MG TABS tablet Take by mouth. (Patient not taking: Reported on 01/03/2023)     gabapentin (NEURONTIN) 300 MG capsule TAKE 1 CAPSULE BY MOUTH TWO TIMES A DAY (Patient not taking: Reported on 01/03/2023)     lisinopril (PRINIVIL,ZESTRIL) 5 MG tablet Take by mouth. Take 1 tab by mouth once daily (Patient not taking: Reported on 01/03/2023)     metFORMIN (GLUCOPHAGE) 500 MG tablet Take 500 mg by mouth 2 (two) times daily. (Patient not taking: Reported on 01/03/2023)     metoprolol tartrate (LOPRESSOR) 50 MG tablet Take 50 mg by mouth 2 (two) times daily. (Patient not taking: Reported on 01/03/2023)     nystatin (MYCOSTATIN) 100000 UNIT/ML suspension Take 5 mLs (500,000 Units total) by mouth 4 (four) times daily. Swish and Spit (Patient not taking: Reported on 01/03/2023) 473 mL 1   ondansetron (ZOFRAN) 8 MG tablet Take 1 tablet (8 mg total) by mouth every 8 (eight) hours as needed for nausea or vomiting. Start on the third day after cisplatin. (Patient not taking: Reported on 01/03/2023) 30 tablet 1   prochlorperazine (COMPAZINE) 10 MG tablet Take 1 tablet (10 mg total) by mouth every 6 (six) hours as needed (Nausea or vomiting). (Patient not taking: Reported on 01/03/2023) 30 tablet 1   simvastatin (ZOCOR) 40 MG tablet Take 40 mg by mouth daily. (Patient not taking: Reported on 01/03/2023)     sucralfate (CARAFATE) 1 GM/10ML suspension Take 10 mLs (1 g total) by mouth 4 (four) times daily -  with meals and at bedtime. (Patient not taking: Reported on 01/03/2023) 420 mL 0    vitamin B-12 (CYANOCOBALAMIN) 1000 MCG tablet Take 1,000 mcg by mouth daily. (Patient not taking: Reported on 01/03/2023)     No current facility-administered medications for this visit.    VITAL SIGNS: BP 105/67   Pulse 62   Temp 97.9 F (36.6 C) (Tympanic)   Resp 18   Ht 5\' 9"  (1.753 m)   Wt 172 lb 9.6 oz (78.3 kg)   SpO2 100%   BMI 25.49 kg/m  Filed Weights   01/03/23 0920  Weight: 172 lb 9.6 oz (78.3 kg)    Estimated body mass index is 25.49 kg/m as calculated from the following:   Height as of this encounter: 5\' 9"  (1.753 m).   Weight as of this encounter: 172 lb 9.6 oz (78.3 kg).  LABS: CBC:    Component Value Date/Time   WBC 7.2 01/03/2023 0849   WBC 10.1 01/21/2021 1348   HGB 14.1 01/03/2023 0849   HCT 40.3 01/03/2023 0849   PLT 111 (L) 01/03/2023 0849   MCV 91.0 01/03/2023 0849   NEUTROABS 5.8 01/03/2023 0849   LYMPHSABS 0.6 (L) 01/03/2023 0849   MONOABS 0.8 01/03/2023 0849   EOSABS 0.0 01/03/2023 0849   BASOSABS 0.0 01/03/2023 0849   Comprehensive Metabolic Panel:  Component Value Date/Time   NA 137 01/03/2023 0850   K 3.7 01/03/2023 0850   CL 98 01/03/2023 0850   CO2 22 01/03/2023 0850   BUN 21 01/03/2023 0850   CREATININE 0.87 01/03/2023 0850   GLUCOSE 172 (H) 01/03/2023 0850   CALCIUM 8.9 01/03/2023 0850   AST 24 01/03/2023 0850   ALT 19 01/03/2023 0850   ALKPHOS 68 01/03/2023 0850   BILITOT 1.4 (H) 01/03/2023 0850   PROT 6.7 01/03/2023 0850   ALBUMIN 3.4 (L) 01/03/2023 0850    RADIOGRAPHIC STUDIES: DG Chest 2 View  Result Date: 12/30/2022 CLINICAL DATA:  Provided history: Aspiration, weakness. EXAM: CHEST - 2 VIEW COMPARISON:  PET CT 11/15/2022. FINDINGS: Heart size within normal limits. No appreciable airspace consolidation. No evidence of pleural effusion or pneumothorax. No acute osseous abnormality identified. No acute osseous abnormality identified. Dextrocurvature of the thoracic spine. Metallic screw within the distal left clavicle.  IMPRESSION: No evidence of active cardiopulmonary disease. Electronically Signed   By: Jackey Loge D.O.   On: 12/30/2022 10:04   DG SWALLOW FUNC OP MEDICARE SPEECH PATH  Result Date: 12/22/2022 Table formatting from the original result was not included. Modified Barium Swallow Study Patient Details Name: OSHAY BJORK MRN: [id number] Date of Birth: 08-Apr-1951 Today's Date: 12/22/2022 HPI/PMH: HPI: Pt is a 72 year old male with past medical history of DVT who is currently undergoing concurrent chemoradiation treatment for at 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 Soft tissue neck - 09/30/2022  Infiltrating mass in the right tongue measuring   up to 4 cm. Prominent submucosal involvement including in the right   parapharyngeal fat. Assessment of relationship to the pterygoids and   extrinsic tongue musculature is somewhat hindered by streak artifact   from dental amalgam.      US biopsy - 10/22/2022  Lymph node, right cervical:  Positive for malignancy; Metastatic Squamous Cell Carcinoma, Keratinizing, in a background of dense fibrosis and tumor necrosis  An immunohistochemical study directed against p16 is strongly and   diffusely positive, indicating an HPV driven malignancy. Clinical Impression: Pt presents severe oropharyngeal dysphagia when consuming thin liquids via cup, nectar thick liquids via cup and puree.      His dysphagia is multifactorial in nature:  severe trismus - trials limited in study d/t inability to open mouth to receive spoon, graham cracker or barium tablet  increased pharyngeal weakness (BOT/hyolaryngeal weakness resulting in decreased epiglottic ) from chemoradiation for oropharyngeal cancer  tumor burden (see CT Soft Tissue Neck)  baseline structural dysphagia d/t the appearance of large osteophytes at C4-C5     As a result, pt presents with silent aspiration of thin liquids and nectar thick liquids and decreased intake of puree with ineffiecent pharyngeal clearing of puree  resulting in multiple swallows. At this time, recommend pt be NPO with water protocol following oral care, intensive trismus and pharyngeal treatment with serious consideration for alternative means of nutrition to prevent further dehydration and malnutrition while undergoing cancer treatments. Factors that may increase risk of adverse event in presence of aspiration Rubye Oaks & Clearance Coots 2021): Factors that may increase risk of adverse event in presence of aspiration Rubye Oaks & Clearance Coots 2021): Frail or deconditioned; Inadequate oral hygiene; Reduced saliva; Weak cough (orophrayngeal mass, radiation treatment for oropharyngeal mass, weakness/deconditioning d/t weight loss associated with severe trismus; severe trismus) Recommendations/Plan: Swallowing Evaluation Recommendations Swallowing Evaluation Recommendations Recommendations: NPO; Free water protocol after oral care; Alternative means of nutrition - G Tube Medication Administration: Via  alternative means Supervision: Patient able to self-feed Oral care recommendations: Oral care QID (4x/day) Recommended consults: Consider GI consultation (for G-Tube placement) Treatment Plan Treatment Plan Treatment recommendations: Other (comment) (continue Outpatient ST services) Follow-up recommendations: Outpatient SLP Functional status assessment: Patient has had a recent decline in their functional status and demonstrates the ability to make significant improvements in function in a reasonable and predictable amount of time. Recommendations Recommendations for follow up therapy are one component of a multi-disciplinary discharge planning process, led by the attending physician.  Recommendations may be updated based on patient status, additional functional criteria and insurance authorization. Assessment: Orofacial Exam: Orofacial Exam Oral Cavity: Oral Hygiene: WFL Oral Cavity - Dentition: Adequate natural dentition Orofacial Anatomy: WFL Anatomy: Anatomy: -- (Large appearing  osteophytes at C4-C5) Boluses Administered: Boluses Administered Boluses Administered: Thin liquids (Level 0); Mildly thick liquids (Level 2, nectar thick); Puree  Oral Impairment Domain: Oral Impairment Domain Lip Closure: No labial escape Tongue control during bolus hold: Cohesive bolus between tongue to palatal seal Bolus preparation/mastication: Disorganized chewing/mashing with solid pieces of bolus unchewed (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) Bolus transport/lingual motion: Brisk tongue motion; Repetitive/disorganized tongue motion Oral residue: Trace residue lining oral structures Location of oral residue : Tongue Initiation of pharyngeal swallow : Valleculae; Pyriform sinuses  Pharyngeal Impairment Domain: Pharyngeal Impairment Domain Soft palate elevation: No bolus between soft palate (SP)/pharyngeal wall (PW) Laryngeal elevation: Partial superior movement of thyroid cartilage/partial approximation of arytenoids to epiglottic petiole Anterior hyoid excursion: Partial anterior movement Epiglottic movement: Partial inversion Laryngeal vestibule closure: Incomplete, narrow column air/contrast in laryngeal vestibule Pharyngeal stripping wave : Present - diminished Pharyngoesophageal segment opening: Partial distention/partial duration, partial obstruction of flow (suspect that large osetophytes play role in clearance thru PE segment) Tongue base retraction: Trace column of contrast or air between tongue base and PPW Pharyngeal residue: Collection of residue within or on pharyngeal structures; Majority of contrast within or on pharyngeal structures Location of pharyngeal residue: Valleculae; Pharyngeal wall; Pyriform sinuses; Diffuse (>3 areas)  Esophageal Impairment Domain: No data recorded Pill: No data recorded Penetration/Aspiration Scale Score: Penetration/Aspiration Scale Score 3.  Material enters airway, remains ABOVE vocal cords and not ejected out: Puree 8.  Material enters airway,  passes BELOW cords without attempt by patient to eject out (silent aspiration) : Thin liquids (Level 0); Mildly thick liquids (Level 2, nectar thick) Compensatory Strategies: No data recorded  General Information: Caregiver present: No  Diet Prior to this Study: Dysphagia 1 (pureed); Thin liquids (Level 0)   Temperature : Normal   Respiratory Status: WFL   Supplemental O2: None (Room air)   History of Recent Intubation: No  Behavior/Cognition: Alert; Cooperative; Pleasant mood (emotional d/t recent staging of cancer) Self-Feeding Abilities: Able to self-feed Baseline vocal quality/speech: Abnormal resonance (Wet; low vocal intensity) Volitional Cough: Able to elicit Volitional Swallow: Unable to elicit (attempted but unable to complete swallow) Exam Limitations: Other (comment) (bolus adminstration and bolus consistencies limited by severe trismus) Goal Planning: Prognosis for improved oropharyngeal function: Fair Barriers to Reach Goals: Overall medical prognosis; Severity of deficits; Medication (chemo, radiation) No data recorded No data recorded Consulted and agree with results and recommendations: Patient Pain: Pain Assessment Pain Assessment: 0-10 Pain Score: 8 Pain Location: jaw, tongue Pain Descriptors / Indicators: Aching; Constant; Contraction Pain Intervention(s): Limited activity within patient's tolerance; Monitored during session End of Session: Start Time:SLP Start Time (ACUTE ONLY): 1230 Stop Time: SLP Stop Time (ACUTE ONLY): 1300 Time Calculation:SLP Time Calculation (min) (ACUTE ONLY):  30 min Charges: SLP Evaluations $ SLP Speech Visit: 1 Visit SLP Evaluations $MBS Swallow: 1 Procedure SLP visit diagnosis: SLP Visit Diagnosis: Dysphagia, oropharyngeal phase (R13.12) Past Medical History: Past Medical History: Diagnosis Date  Diabetes mellitus without complication (HCC)   Hyperlipidemia   Hypertension  Past Surgical History: Past Surgical History: Procedure Laterality Date  arm surgery Left    CORONARY ANGIOPLASTY WITH STENT PLACEMENT    left side rotator cuff surgery    SHOULDER SURGERY   Happi Overton 12/22/2022, 3:55 PM CLINICAL DATA:  Patient with a history of oropharyngeal squamous cell carcinoma. Patient presents today with complaints of dysphagia, throat pain. EXAM: MODIFIED BARIUM SWALLOW TECHNIQUE: Different consistencies of barium were administered orally to the patient by the Speech Pathologist. Imaging of the pharynx was performed in the lateral projection. Alwyn Ren, NP was present in the fluoroscopy room during this study, which was supervised and interpreted by Dr. Grace Isaac. FLUOROSCOPY: Radiation Exposure Index (as provided by the fluoroscopic device): 5.20 mGy Kerma COMPARISON:  Neck CT-09/28/2022 FINDINGS: Vestibular  Penetration:  Observed with all consistencies of barium. Aspiration:  Observed with thin and nectar thick barium. Other: Note is made of prominent anteriorly directed syndesmophytes within mid and inferior aspects of the cervical spine as demonstrated on recent neck CT IMPRESSION: Vestibular penetration with all consistencies of barium. Aspiration observed with thin and nectar thick barium. Read by: Alwyn Ren, NP Please refer to the Speech Pathologists report for complete details and recommendations. Electronically Signed   By: Simonne Come M.D.   On: 12/21/2022 13:38   PERFORMANCE STATUS (ECOG) : 1 - Symptomatic but completely ambulatory  Review of Systems Unless otherwise noted, a complete review of systems is negative.  Physical Exam General: NAD Cardiovascular: regular rate and rhythm Pulmonary: clear ant fields Abdomen: soft, nontender, + bowel sounds GU: no suprapubic tenderness Extremities: no edema, no joint deformities Skin: no rashes Neurological: Nonfocal  IMPRESSION/PLAN: Stage IVa oropharyngeal cancer -on concurrent chemoradiation  Syncope -suspect orthostasis combined with vasodilation from the shower.  Patient will require IV  fluids.  Weight loss -patient unable to meet caloric needs.  Patient is down another 5 pounds since last week.  He is at high risk for refeeding syndrome.  At this point, patient would be best served by hospitalization to proceed with GI consult/PEG.    Discussed with Dr. Cathie Hoops who recommended hospitalization.  Patient and sister both in agreement and he was sent to the emergency department for further evaluation management.  Patient expressed understanding and was in agreement with this plan. He also understands that He can call clinic at any time with any questions, concerns, or complaints.   Thank you for allowing me to participate in the care of this very pleasant patient.   Time Total: 20 minutes  Visit consisted of counseling and education dealing with the complex and emotionally intense issues of symptom management in the setting of serious illness.Greater than 50%  of this time was spent counseling and coordinating care related to the above assessment and plan.  Signed by: Laurette Schimke, PhD, NP-C

## 2023-01-03 NOTE — Consult Note (Signed)
Cardiology Consultation   Patient ID: Kenneth Gilmore MRN: [id number]; DOB: 20-Oct-1950  Admit date: 01/03/2023 Date of Consult: 01/03/2023  PCP:  [facility]   [facility] Providers Cardiologist:  None      New Consult done by Dr Kirke Corin  Patient Profile:   Kenneth Gilmore is a 72 y.o. 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 Rosalia Hammers.  History of Present Illness:   Kenneth Gilmore is a 72 year old male with multiple medical problems who is being followed by hospice and palliative medicine for his stage IVa oropharyngeal squamous cell carcinoma on concurrent chemoradiation.  Recent evaluation by speech with concerns of aspiration and dysphagia and was recommended to remain n.p.o.  He was seen by GI with plans for PEG tube.  He has required frequent supportive care/IV fluids.  Last week when he was seen to have a discussion about possible hospitalization for dehydration and the patient declined at that time.  Patient was seen in clinic today for labs.  Unfortunately patient stated he had had a syncopal event earlier in the morning stating he was taking a shower and found himself lying facedown on the bathroom floor.  With his dysphagia and aspiration suspect orthostasis combined with vasodilation from the shower he would require IV fluids and upon further discussion with the patient and his sister it was recommended he present to the emergency department for further evaluation and management.  Patient presented to [facility] emergency department from the [facility] with complaints of a syncopal episode last night and weakness today.  Patient is on blood thinners for  history of DVT of rivaroxaban.  He denied hitting his head when he syncopized in the shower this morning stating that he woke up soon as he hit the ground.  He also denies any chest pain, shortness of breath, or palpitations.  EKG was performed in triage which did demonstrate heart rates of 140s with looking like he was in atrial flutter.  He denied any history of any other atrial arrhythmias.  Patient states that he was sent to the emergency department to follow-up with GI and received a PEG tube due to his dysphagia and inability to eat or drink liquids for approximately the past 3 months.  Initial vitals: Blood pressure 103/69, pulse 75, respirations of 18, temperature 97.8  Pertinent labs: Platelets of 129, CO2 21, blood glucose 179, anion gap 18, high-sensitivity troponin 2925  Cardiology was consulted for new onset atrial flutter and elevated high-sensitivity troponins.   Past Medical History:  Diagnosis Date   Diabetes mellitus without complication (HCC)    Hyperlipidemia    Hypertension    Oropharyngeal cancer (HCC)     Past Surgical History:  Procedure Laterality Date   arm surgery Left    CORONARY ANGIOPLASTY WITH STENT PLACEMENT     left side rotator cuff surgery     SHOULDER SURGERY       Home Medications:  Prior to Admission medications   Medication Sig Start Date End Date Taking? Authorizing Provider  aspirin 81 MG tablet Take 81 mg by mouth daily. Patient not taking: Reported on 01/03/2023    [provider]  atorvastatin (LIPITOR) 80 MG tablet  TAKE ONE-HALF TABLET BY MOUTH AT BEDTIME FOR HIGH CHOLESTEROL REPLACES SIMVASTATIN Patient not taking: Reported on 01/03/2023 04/14/22   [provider]  Cholecalciferol 25 MCG (1000 UT) tablet Take 1 tablet by mouth daily. Patient not taking: Reported on 01/03/2023 09/06/20   [provider]  dexamethasone (DECADRON) 4 MG tablet Take 2 tablets (8 mg) by mouth daily x 2 days starting the day after  cisplatin chemotherapy. Take with food. Patient not taking: Reported on 01/03/2023 11/16/22   [name], MD  empagliflozin (JARDIANCE) 10 MG TABS tablet Take by mouth. Patient not taking: Reported on 01/03/2023    [provider]  gabapentin (NEURONTIN) 300 MG capsule TAKE 1 CAPSULE BY MOUTH TWO TIMES A DAY Patient not taking: Reported on 01/03/2023 11/22/21   [provider]  lisinopril (PRINIVIL,ZESTRIL) 5 MG tablet Take by mouth. Take 1 tab by mouth once daily Patient not taking: Reported on 01/03/2023 03/15/17   [provider]  metFORMIN (GLUCOPHAGE) 500 MG tablet Take 500 mg by mouth 2 (two) times daily. Patient not taking: Reported on 01/03/2023    [provider]  metoprolol tartrate (LOPRESSOR) 50 MG tablet Take 50 mg by mouth 2 (two) times daily. Patient not taking: Reported on 01/03/2023 03/15/17   [provider]  nystatin (MYCOSTATIN) 100000 UNIT/ML suspension Take 5 mLs (500,000 Units total) by mouth 4 (four) times daily. Swish and Spit Patient not taking: Reported on 01/03/2023 12/16/22   [name], MD  ondansetron (ZOFRAN) 8 MG tablet Take 1 tablet (8 mg total) by mouth every 8 (eight) hours as needed for nausea or vomiting. Start on the third day after cisplatin. Patient not taking: Reported on 01/03/2023 11/16/22   [name], MD  oxyCODONE (OXY IR/ROXICODONE) 5 MG immediate release tablet Take 1 tablet (5 mg total) by mouth every 6 (six) hours as needed for severe pain. 12/09/22   [name], MD  prochlorperazine (COMPAZINE) 10 MG tablet Take 1 tablet (10 mg total) by mouth every 6 (six) hours as needed (Nausea or vomiting). Patient not taking: Reported on 01/03/2023 11/16/22   [name], MD  simvastatin (ZOCOR) 40 MG tablet Take 40 mg by mouth daily. Patient not taking: Reported on 01/03/2023 03/15/17   [provider]  sucralfate (CARAFATE) 1 GM/10ML suspension Take 10 mLs (1 g total) by mouth 4 (four) times daily -  with meals and at  bedtime. Patient not taking: Reported on 01/03/2023 12/21/22   Borders, Daryl Eastern, NP  vitamin B-12 (CYANOCOBALAMIN) 1000 MCG tablet Take 1,000 mcg by mouth daily. Patient not taking: Reported on 01/03/2023    [provider]    Inpatient Medications: Scheduled Meds:  Continuous Infusions:  diltiazem (CARDIZEM) infusion 5 mg/hr (01/03/23 1132)   PRN Meds:   Allergies:   No Known Allergies  Social History:   Social History   Socioeconomic History   Marital status: Single    Spouse name: Not on file   Number of children: Not on file   Years of education: Not on file   Highest education level: Not on file  Occupational History   Occupation: retired  Tobacco Use   Smoking status: Never   Smokeless tobacco: Current    Types: Chew  Substance and Sexual Activity   Alcohol use: No   Drug use: Never   Sexual activity: Not on file  Other Topics Concern   Not on file  Social History Narrative   Not on file   Social Determinants of Health  Financial Resource Strain: Low Risk  (12/27/2022)   Overall Financial Resource Strain (CARDIA)    Difficulty of Paying Living Expenses: Not hard at all  Food Insecurity: No Food Insecurity (12/27/2022)   Hunger Vital Sign    Worried About Running Out of Food in the Last Year: Never true    Ran Out of Food in the Last Year: Never true  Transportation Needs: No Transportation Needs (12/27/2022)   PRAPARE - Administrator, Civil Service (Medical): No    Lack of Transportation (Non-Medical): No  Physical Activity: Not on file  Stress: Stress Concern Present (12/27/2022)   [employer] of Occupational Health - Occupational Stress Questionnaire    Feeling of Stress : To some extent  Social Connections: Moderately Integrated (12/27/2022)   Social Connection and Isolation Panel [NHANES]    Frequency of Communication with Friends and Family: More than three times a week    Frequency of Social Gatherings with Friends and Family:  More than three times a week    Attends Religious Services: More than 4 times per year    Active Member of [employer] or Organizations: Yes    Attends Banker Meetings: More than 4 times per year    Marital Status: Never married  Intimate Partner Violence: Not At Risk (12/27/2022)   Humiliation, Afraid, Rape, and Kick questionnaire    Fear of Current or Ex-Partner: No    Emotionally Abused: No    Physically Abused: No    Sexually Abused: No    Family History:    Family History  Problem Relation Age of Onset   Cancer Father        lung     ROS:  Please see the history of present illness.  Review of Systems  Constitutional:  Positive for malaise/fatigue.  Gastrointestinal:        Positive for 3 months of dysphagia, poor p.o. intake  Neurological:  Positive for loss of consciousness and weakness.    All other ROS reviewed and negative.     Physical Exam/Data:   Vitals:   01/03/23 1130 01/03/23 1200 01/03/23 1230 01/03/23 1300  BP: 115/85 111/68 107/78 107/77  Pulse: (!) 45 (!) 46 79 84  Resp: 18 14 13 15   Temp:      TempSrc:      SpO2: 100% 99% 98% 99%  Weight:      Height:       No intake or output data in the 24 hours ending 01/03/23 1344    01/03/2023   10:30 AM 01/03/2023    9:20 AM 12/31/2022    9:23 AM  Last 3 Weights  Weight (lbs) 171 lb 15.3 oz 172 lb 9.6 oz 176 lb  Weight (kg) 78 kg 78.291 kg 79.833 kg     Body mass index is 25.39 kg/m.  General:  Well nourished, well developed, in no acute distress HEENT: normal, unable to open his mouth very wide Neck: no JVD Vascular: No carotid bruits; Distal pulses 2+ bilaterally Cardiac:  normal S1, S2; RRR; no murmur  Lungs:  clear to auscultation bilaterally, no wheezing, rhonchi or rales Respirations are unlabored on room air at rest Abd: soft, nontender, no hepatomegaly  Ext: no edema Musculoskeletal:  No deformities, BUE and BLE strength normal and equal Skin: warm and dry  Neuro:  CNs 2-12 intact,  no focal abnormalities noted Psych:  Normal affect   EKG:  The EKG was personally reviewed and demonstrates: Initial EKG with  atrial flutter with variable block rates of 150s; repeat EKG was sinus rhythm rate of 88, PACs, PVCs and inverted P waves Telemetry:  Telemetry was personally reviewed and demonstrates:  sinus rate of 80's with nonsustained VT 4-6 beats runs and unifocal PVC's   Relevant CV Studies: Echocardiogram ordered and pending  Laboratory Data:  High Sensitivity Troponin:   Recent Labs  Lab 01/03/23 1032  TROPONINIHS 2,925*     Chemistry Recent Labs  Lab 12/30/22 0812 12/31/22 0915 01/03/23 0849 01/03/23 0850 01/03/23 1032 01/03/23 1033  NA 133* 132*  --  137  --  137  K 5.0 3.8  --  3.7  --  4.3  CL 95* 96*  --  98  --  98  CO2 18* 18*  --  22  --  21*  GLUCOSE 205* 159*  --  172*  --  179*  BUN 25* 24*  --  21  --  22  CREATININE 1.18 1.00  --  0.87  --  0.92  CALCIUM 9.3 8.8*  --  8.9  --  9.2  MG 1.9  --  1.9  --  2.1  --   GFRNONAA >60 >60  --  >60  --  >60  ANIONGAP 20* 18*  --  17*  --  18*    Recent Labs  Lab 12/30/22 0812 12/31/22 0915 01/03/23 0850  PROT 7.6 6.8 6.7  ALBUMIN 3.9 3.5 3.4*  AST 62* 38 24  ALT 31 26 19   ALKPHOS 76 65 68  BILITOT 1.8* 1.5* 1.4*   Lipids No results for input(s): "CHOL", "TRIG", "HDL", "LABVLDL", "LDLCALC", "CHOLHDL" in the last 168 hours.  Hematology Recent Labs  Lab 12/31/22 0915 01/03/23 0849 01/03/23 1033  WBC 9.2 7.2 7.5  RBC 4.45 4.43 4.66  HGB 14.2 14.1 14.9  HCT 41.2 40.3 42.6  MCV 92.6 91.0 91.4  MCH 31.9 31.8 32.0  MCHC 34.5 35.0 35.0  RDW 11.9 12.1 12.3  PLT 158 111* 129*   Thyroid No results for input(s): "TSH", "FREET4" in the last 168 hours.  BNPNo results for input(s): "BNP", "PROBNP" in the last 168 hours.  DDimer No results for input(s): "DDIMER" in the last 168 hours.   Radiology/Studies:  CT Head Wo Contrast  Result Date: 01/03/2023 CLINICAL DATA:  Trauma.  History of head  neck cancer. EXAM: CT HEAD WITHOUT CONTRAST CT CERVICAL SPINE WITHOUT CONTRAST TECHNIQUE: Multidetector CT imaging of the head and cervical spine was performed following the standard protocol without intravenous contrast. Multiplanar CT image reconstructions of the cervical spine were also generated. RADIATION DOSE REDUCTION: This exam was performed according to the departmental dose-optimization program which includes automated exposure control, adjustment of the mA and/or kV according to patient size and/or use of iterative reconstruction technique. COMPARISON:  MR brain 03/29/2020 FINDINGS: CT HEAD FINDINGS Brain: No evidence of acute infarction, hemorrhage, hydrocephalus, extra-axial collection or mass lesion/mass effect. There is heterogeneous low-attenuation within the subcortical and periventricular white matter compatible with chronic microvascular disease. Vascular: No hyperdense vessel or unexpected calcification. Skull: Normal. Negative for fracture or focal lesion. Sinuses/Orbits: No acute finding. Other: None. CT CERVICAL SPINE FINDINGS Alignment: The alignment of the cervical spine is normal. Skull base and vertebrae: No signs of acute fracture or subluxation. Soft tissues and spinal canal: No prevertebral fluid or swelling. No visible canal hematoma. Disc levels: There are large, bulky, flowing ventral syndesmophytes throughout the cervical spine. Disc space narrowing and calcification identified at C3-4. There is  also disc space narrowing at C5-6 and C7-T1. Mild bilateral facet arthropathy. Upper chest: Right base of tongue/oropharyngeal mass is again identified. This is only partially visualized on the current study, image 39/4. Other: None IMPRESSION: 1. No evidence for acute intracranial abnormality. 2. Chronic microvascular disease. 3. No evidence for acute cervical spine fracture or subluxation. 4. 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 5. Right base of tongue/oropharyngeal  mass is again identified. This is only partially visualized on the current study. See PET-CT report from 11/15/2022 for details. Electronically Signed   By: Signa Kell M.D.   On: 01/03/2023 13:37   CT Cervical Spine Wo Contrast  Result Date: 01/03/2023 CLINICAL DATA:  Trauma.  History of head neck cancer. EXAM: CT HEAD WITHOUT CONTRAST CT CERVICAL SPINE WITHOUT CONTRAST TECHNIQUE: Multidetector CT imaging of the head and cervical spine was performed following the standard protocol without intravenous contrast. Multiplanar CT image reconstructions of the cervical spine were also generated. RADIATION DOSE REDUCTION: This exam was performed according to the departmental dose-optimization program which includes automated exposure control, adjustment of the mA and/or kV according to patient size and/or use of iterative reconstruction technique. COMPARISON:  MR brain 03/29/2020 FINDINGS: CT HEAD FINDINGS Brain: No evidence of acute infarction, hemorrhage, hydrocephalus, extra-axial collection or mass lesion/mass effect. There is heterogeneous low-attenuation within the subcortical and periventricular white matter compatible with chronic microvascular disease. Vascular: No hyperdense vessel or unexpected calcification. Skull: Normal. Negative for fracture or focal lesion. Sinuses/Orbits: No acute finding. Other: None. CT CERVICAL SPINE FINDINGS Alignment: The alignment of the cervical spine is normal. Skull base and vertebrae: No signs of acute fracture or subluxation. Soft tissues and spinal canal: No prevertebral fluid or swelling. No visible canal hematoma. Disc levels: There are large, bulky, flowing ventral syndesmophytes throughout the cervical spine. Disc space narrowing and calcification identified at C3-4. There is also disc space narrowing at C5-6 and C7-T1. Mild bilateral facet arthropathy. Upper chest: Right base of tongue/oropharyngeal mass is again identified. This is only partially visualized on the  current study, image 39/4. Other: None IMPRESSION: 1. No evidence for acute intracranial abnormality. 2. Chronic microvascular disease. 3. No evidence for acute cervical spine fracture or subluxation. 4. 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 5. 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 This is only partially visualized on the current study. See PET-CT report from 11/15/2022 for details. Electronically Signed   By: Signa Kell M.D.   On: 01/03/2023 13:37     Assessment and Plan:   Syncope with collapse -Patient states when in the shower he nearly blacked out  before hitting the floor face down -Denied hitting his head -CT of the head pending -Likely related to dehydration from poor oral intake and vasodilation from hot shower  New onset atrial flutter -Found to be in a flutter on arrival rate 150 -Given diltiazem 15 mg bolus and started on diltiazem drip -Converted to sinus with PACs and PVCs shortly thereafter -Echocardiogram ordered and pending with further recommendations to follow -will check TSH and mg -Continue with telemetry monitoring -CHA2DS2-VASc score of at least 4  NSTEMI -On arrival found in atrial flutter rate of 150 -First high-sensitivity troponin 2925 -Patient denies chest pain and remains chest pain-free -if head CT comes back as negative start heparin infusion  -Continue to trend high-sensitivity troponins -Continue with telemetry monitoring -EKG as needed for pain or changes  Coronary artery disease  -Status post PCI of the LAD and OM 2 11/07/01 -  Previously followed with Dr. Lady Gary -No recent ischemic testing completed -previously was on asa and statin therapy -Echo ordered and pending with further recommendations to follow  Hypertension -Blood pressure 107/77 -Vital signs per unit protocol  Hyperlipidemia -Previously been on atorvastatin 40 mg daily will need to restart once able to take oral medications  Type 2  diabetes -Management per IM  Stage IV or III oropharyngeal squamous cell carcinoma -Management per IM and oncology   Risk Assessment/Risk Scores:     TIMI Risk Score for Unstable Angina or Non-ST Elevation MI:   The patient's TIMI risk score is 5, which indicates a 26% risk of all cause mortality, new or recurrent myocardial infarction or need for urgent revascularization in the next 14 days.      CHA2DS2-VASc Score = 4   This indicates a 4.8% annual risk of stroke. The patient's score is based upon: CHF History: 0 HTN History: 1 Diabetes History: 1 Stroke History: 0 Vascular Disease History: 1 Age Score: 1 Gender Score: 0         For questions or updates, please contact [facility] Please consult [email or web address] for contact info under    Signed, Velera Lansdale, NP  01/03/2023 1:44 PM

## 2023-01-03 NOTE — ED Provider Notes (Signed)
[facility] Provider Note    Event Date/Time   First MD Initiated Contact with Patient 01/03/23 1111     (approximate)   History   Loss of Consciousness   HPI  Kenneth Gilmore is a 72 y.o. male presenting to the emergency department for evaluation following a syncopal episode. Patient has a history of oropharyngeal squamous cell carcinoma, DVT on Xarelto.  He has had worsening p.o. intake secondary to dysphagia and odynophagia that he attributes to his cancer.  Earlier today he was taking a shower and woke up on the ground of the shower.  He denies preceding chest pain, shortness of breath.  He was seen in his oncology office where there were concerns about his nutritional intake and he was directed to the ER for anticipated hospitalization for GI consult/PEG tube.  While in triage, patient did have an EKG performed demonstrating heart rates in the 140s with what appears to be atrial flutter.  Patient denies chest pain, shortness of breath, palpitations.  Denies history of atrial arrhythmias.      Physical Exam   Triage Vital Signs: ED Triage Vitals  Enc Vitals Group     BP 01/03/23 1030 103/69     Pulse Rate 01/03/23 1030 75     Resp 01/03/23 1030 18     Temp 01/03/23 1043 97.8 F (36.6 C)     Temp Source 01/03/23 1043 Oral     SpO2 01/03/23 1030 100 %     Weight 01/03/23 1030 171 lb 15.3 oz (78 kg)     Height 01/03/23 1030 5\' 9"  (1.753 m)     Head Circumference --      Peak Flow --      Pain Score 01/03/23 1030 0     Pain Loc --      Pain Edu? --      Excl. in GC? --     Most recent vital signs: Vitals:   01/03/23 1430 01/03/23 1450  BP: 110/80   Pulse: 72   Resp: 16   Temp:  97.7 F (36.5 C)  SpO2: 98%      General: Awake, interactive  CV:  Tachycardic with irregular rhythm, variable heart rates, frequent ectopy Resp:  Lungs clear, unlabored respirations.  Abd:  Soft, nondistended.  Neuro:  Symmetric facial movement, fluid  speech   ED Results / Procedures / Treatments   Labs (all labs ordered are listed, but only abnormal results are displayed) Labs Reviewed  CBC - Abnormal; Notable for the following components:      Result Value   Platelets 129 (*)    All other components within normal limits  BASIC METABOLIC PANEL - Abnormal; Notable for the following components:   CO2 21 (*)    Glucose, Bld 179 (*)    Anion gap 18 (*)    All other components within normal limits  PROTIME-INR - Abnormal; Notable for the following components:   Prothrombin Time 15.9 (*)    INR 1.3 (*)    All other components within normal limits  TROPONIN I (HIGH SENSITIVITY) - Abnormal; Notable for the following components:   Troponin I (High Sensitivity) 2,925 (*)    All other components within normal limits  TROPONIN I (HIGH SENSITIVITY) - Abnormal; Notable for the following components:   Troponin I (High Sensitivity) 3,672 (*)    All other components within normal limits  MAGNESIUM  APTT  HEPARIN LEVEL (UNFRACTIONATED)  HEPARIN LEVEL (UNFRACTIONATED)  EKG EKG independently reviewed interpreted by myself (ER attending) demonstrates:  EKG from 1037 demonstrates atrial flutter at a rate of 152, QRS 84, QTc 362, some ST elevation in the anterior leads, possibly rate related.  Interpreted by computer as a STEMI, feel that ST changes are likely secondary to arrhythmia/rate, do not think code STEMI activation indicated at this time.  EKG from 1253 demonstrates sinus rhythm with multiple PVCs, improvement in previously noted ST elevation, rate 81, PR 138, QRS 86, QTc 381  RADIOLOGY Imaging independently reviewed and interpreted by myself demonstrates:    PROCEDURES:  Critical Care performed: Yes, see critical care procedure note(s)  CRITICAL CARE Performed by: Trinna Post   Total critical care time: 40 minutes  Critical care time was exclusive of separately billable procedures and treating other patients.  Critical  care was necessary to treat or prevent imminent or life-threatening deterioration.  Critical care was time spent personally by me on the following activities: development of treatment plan with patient and/or surrogate as well as nursing, discussions with consultants, evaluation of patient's response to treatment, examination of patient, obtaining history from patient or surrogate, ordering and performing treatments and interventions, ordering and review of laboratory studies, ordering and review of radiographic studies, pulse oximetry and re-evaluation of patient's condition.   Procedures   MEDICATIONS ORDERED IN ED: Medications  diltiazem (CARDIZEM) 125 mg in dextrose 5% 125 mL (1 mg/mL) infusion (5 mg/hr Intravenous New Bag/Given 01/03/23 1132)  aspirin tablet 325 mg (325 mg Oral Given 01/03/23 1451)  acetaminophen (TYLENOL) tablet 650 mg (has no administration in time range)    Or  acetaminophen (TYLENOL) suppository 650 mg (has no administration in time range)  ondansetron (ZOFRAN) tablet 4 mg (has no administration in time range)    Or  ondansetron (ZOFRAN) injection 4 mg (has no administration in time range)  senna-docusate (Senokot-S) tablet 1 tablet (has no administration in time range)  insulin aspart (novoLOG) injection 0-9 Units (has no administration in time range)  insulin aspart (novoLOG) injection 0-5 Units (has no administration in time range)  heparin bolus via infusion 4,000 Units (4,000 Units Intravenous Bolus from Bag 01/03/23 1614)    And  heparin ADULT infusion 100 units/mL (25000 units/244mL) (900 Units/hr Intravenous New Bag/Given 01/03/23 1614)  diltiazem (CARDIZEM) 1 mg/mL load via infusion 15 mg (15 mg Intravenous Bolus from Bag 01/03/23 1132)  sodium chloride 0.9 % bolus 500 mL (0 mLs Intravenous Stopped 01/03/23 1607)     IMPRESSION / MDM / ASSESSMENT AND PLAN / ED COURSE  I reviewed the triage vital signs and the nursing notes.  Differential diagnosis  includes, but is not limited to, arrhythmia, anemia, electrolyte abnormality, ACS  Patient's presentation is most consistent with acute presentation with potential threat to life or bodily function.  72 year old male presenting following a syncopal episode with poor nutritional intake found to be in atrial flutter.  Patient was given a liter of IV fluid and placed on a diltiazem bolus followed by drip.  With this, he did have improvement in his rate to the low 100s with conversion to sinus rhythm.  His troponin returned significantly elevated at 2925.  Case was discussed with Dr. Kary Kos with cardiology.  He did agree that initial ST abnormalities were likely related to arrhythmia, and was reassured by repeat EKG after conversion.  He did recommend admission for NSTEMI with cycling of tropes, echo, heparin drip to the hospital service.  With history of syncopal episode and possible head injury  on anticoagulation, will obtain CT head and C-spine prior to initiation of heparin.  Ultimate plan for admission.  CT head and C-spine negative.  Patient without any complaints on reevaluation.  He is aware of the results of his workup and agreeable with plan for admission.  Will order heparin drip and aspirin.  Will reach out to hospitalist team for admission.     FINAL CLINICAL IMPRESSION(S) / ED DIAGNOSES   Final diagnoses:  Atrial flutter, unspecified type (HCC)  NSTEMI (non-ST elevated myocardial infarction) (HCC)  Poor nutrition     Rx / DC Orders   ED Discharge Orders     None        Note:  This document was prepared using Dragon voice recognition software and may include unintentional dictation errors.   Trinna Post, MD 01/03/23 [phone]

## 2023-01-03 NOTE — ED Triage Notes (Signed)
First Nurse Note:  Patient sent to ED from [facility]. Patient with Syncopal episode last night.  Hx head and neck cancer.  Pt cannot open mouth, found aspiration.  Also c/o weakness.  Per Cancer Center, patient needs GI consult and peg.

## 2023-01-03 NOTE — Progress Notes (Signed)
Pt reports a fall this morning after getting out of the shower. He stated that he passed out. He was at home by himself. Today, Pt accompanied by his sister who drove patient to the appointment today. Pt reports increased weakness. BP 105/67. HR 69. Weight loss- now down to 172.3 from 176 pounds from last week.   Pt has not been able to intake any medications other than his oxycodone for pain. Last took pain med 2 days ago. Currently rates jaw pain 5/10.

## 2023-01-03 NOTE — Progress Notes (Addendum)
ANTICOAGULATION CONSULT NOTE - Initial Consult  Pharmacy Consult for Heparin Indication: STEMI  No Known Allergies  Patient Measurements: Height: 5\' 9"  (175.3 cm) Weight: 78 kg (171 lb 15.3 oz) IBW/kg (Calculated) : 70.7 Heparin Dosing Weight: 78  Vital Signs: Temp: 97.7 F (36.5 C) (05/13 1450) Temp Source: Oral (05/13 1450) BP: 110/80 (05/13 1430) Pulse Rate: 72 (05/13 1430)  Labs: Recent Labs    01/03/23 0849 01/03/23 0850 01/03/23 1032 01/03/23 1033 01/03/23 1354 01/03/23 1514  HGB 14.1  --   --  14.9  --   --   HCT 40.3  --   --  42.6  --   --   PLT 111*  --   --  129*  --   --   APTT  --   --   --   --   --  24  LABPROT  --   --   --   --   --  15.9*  INR  --   --   --   --   --  1.3*  CREATININE  --  0.87  --  0.92  --   --   TROPONINIHS  --   --  2,925*  --  3,672*  --     Estimated Creatinine Clearance: 72.6 mL/min (by C-G formula based on SCr of 0.92 mg/dL).  Assessment: 66 yoM w/ hx CAD s/p PCI and recently off rivaroxiban for DVT, admitted with syncopal episode, new onset Afib and elevated troponins. Pharmacy has been consulted to assist with systemic heparin therapy. H/H within normal limits, noted low platelet count. Per patient account, has been off xarelto for several weeks.   Goal of Therapy:  Heparin level 0.3-0.7 units/ml Monitor platelets by anticoagulation protocol: Yes   Plan:  Heparin 4000 units Heparin gtt at 900 units /hr Heparin level in 8 hours Daily HL and CBC Monitor for any bleeding complications  Caryl Asp, PharmD Clinical Pharmacist 01/03/2023 3:46 PM

## 2023-01-03 NOTE — ED Triage Notes (Signed)
Pt to ED for LOC this morning while getting out of shower. Denies hitting head, +blood thinners.  Pt also reports difficulty swallowing started with throat cancer 3 months ago, needs feeding tube.  RR even and unlabored. NAD noted

## 2023-01-03 NOTE — H&P (Addendum)
History and Physical   Kenneth Gilmore ZOX:[id number] DOB: 04-Jan-1951 DOA: 01/03/2023  PCP: [facility]  Outpatient Specialists: Dr. Cathie Hoops, medical oncology Patient coming from: Oncology clinic  I have personally briefly reviewed patient's old medical records in [facility] EMR.  Chief Concern: Syncope  HPI: Mr. Kenneth Gilmore is a 72 year old male with non-insulin-dependent diabetes mellitus, hyperlipidemia, history of DVT on Xarelto, oropharyngeal squamous cell carcinoma stage IVa, who presents emergency department from oncology clinic for chief concerns of syncope event at home while he was in the shower.  Vitals in the ED showed temperature of 97.8, respiration rate 14, heart rate of 76, blood pressure 105/64, and at the lowest is 94/48, SpO2 100% on room air.  Serum sodium is 137, potassium 4.3, chloride 98, bicarb 21, BUN of 22, serum creatinine is 0.92, EGFR greater than 60, nonfasting blood glucose 179, WBC of 7.5, hemoglobin 14.9, platelets of 129.  High sensitive troponin is 2925.  EDP consulted cardiology service.  ED treatment: Aspirin 325 mg p.o. one-time dose, diltiazem 50 mg IV one-time dose, diltiazem infusion, heparin GTT. ------------------------------- At bedside, patient was able to tell me his name, age, location, current calendar year.  He reports that he was in the shower, and the next and he knew he woke up and he was laying on the shower floor.  He denies full syncope, he states that he only had "half passed out".  He denies chest pain, shortness of breath.  He states he has not been eating well over the last few months, states that it is difficult for him to swallow his food down.  He denies pain when he swallows, nausea, vomiting.  He reports he has lost approximately 40 pounds in 1 month.  Social history: He lives at home by himself.  He denies tobacco, EtOH, recreational drug use.  He is retired.  ROS: Constitutional: + weight change, no  fever ENT/Mouth: no sore throat, no rhinorrhea Eyes: no eye pain, no vision changes Cardiovascular: no chest pain, no dyspnea,  no edema, no palpitations Respiratory: no cough, no sputum, no wheezing Gastrointestinal: no nausea, no vomiting, no diarrhea, no constipation, + difficulty swallowing Genitourinary: no urinary incontinence, no dysuria, no hematuria Musculoskeletal: no arthralgias, no myalgias Skin: no skin lesions, no pruritus, Neuro: + weakness, no loss of consciousness, no syncope Psych: no anxiety, no depression, + decrease appetite Heme/Lymph: no bruising, no bleeding  ED Course: Discussed with emergency medicine provider, patient requiring hospitalization for chief concerns of syncope.  Assessment/Plan  Principal Problem:   NSTEMI (non-ST elevated myocardial infarction) (HCC) Active Problems:   Oropharyngeal cancer (HCC)   History of DVT (deep vein thrombosis)   Neoplasm related pain   Dysphagia   Diabetes mellitus type 2, noninsulin dependent (HCC)   Protein-calorie malnutrition, severe (HCC)   Benign essential tremor   Atrial flutter (HCC)   Hypotension   Syncope and collapse   Assessment and Plan:  * NSTEMI (non-ST elevated myocardial infarction) (HCC) Continue heparin GGT, complete echo ordered Cardiology has been consulted by EDP, appreciate input and recommendations We will continue to trend high sensitive troponin Inpatient admission, PCU  Syncope and collapse Etiology workup in progress, differentials include NSTEMI, new atrial flutter, versus orthostasis secondary to poor p.o. intake in setting of vasodilation and shower Sodium chloride 500 mL bolus one-time dose ordered Complete echo ordered CT head was read as no acute intracranial abnormality and cervical spine without contrast was read as no evidence of acute cervical spine fracture  or subluxation  Hypotension Presumed secondary to poor p.o. intake for several months Sodium chloride 500 mL  bolus one-time dose ordered Sodium chloride infusion at 125 mL/h, 1 day ordered  Atrial flutter (HCC) New diagnosis Continue diltiazem GGT  Benign essential tremor Patient is on gabapentin 300 mg in the morning and 300 mg at night per neurology  Protein-calorie malnutrition, severe (HCC) Presumed multifactorial in setting of stage IV oropharyngeal cancer along with mechanical block of esophagus Dietitian has been consulted Patient endorses that he would like G-tube/PEG tube Gastroenterology has been consulted to Dr. Tobi Bastos via secure chat and Epic order  Diabetes mellitus type 2, noninsulin dependent (HCC) Insulin ssi with HS coverage Goal inpatient blood glucose levels 140-180  Neoplasm related pain Acetaminophen 1000 mg IV every 6 hours as needed for mild pain, fever, headache, 1 day ordered Morphine 2 mg IV every 4 hours as needed for moderate pain, 1 day ordered; fentanyl 12.5 mcg IV every 3 hours as needed for severe pain, 18 hours ordered AM team to reevaluate patient at bedside for continued opioid pain requirements  History of DVT (deep vein thrombosis) Patient is a send he has not taken any of his oral medications for at least 1 week due to difficulty swallowing anything beyond fluid as it will not go down  I suspect some component of failure to thrive-palliative care consulted  Chart reviewed.   DVT prophylaxis: Heparin GGT Code Status: full code Diet: NPO except for sips with meds and ice chips Family Communication: A phone call has been offered, patient declined stating that his sister already knows he is in the hospital as she brought him here Disposition Plan: Pending clinical course, guarded prognosis Consults called: Cardiology Admission status: PCU, inpatient  Past Medical History:  Diagnosis Date   Diabetes mellitus without complication (HCC)    Hyperlipidemia    Hypertension    Oropharyngeal cancer (HCC)    Past Surgical History:  Procedure Laterality  Date   arm surgery Left    CORONARY ANGIOPLASTY WITH STENT PLACEMENT     left side rotator cuff surgery     SHOULDER SURGERY      Social History:  reports that he has never smoked. His smokeless tobacco use includes chew. He reports that he does not drink alcohol and does not use drugs.  No Known Allergies Family History  Problem Relation Age of Onset   Cancer Father        lung   Family history: Family history reviewed and not pertinent  Prior to Admission medications   Medication Sig Start Date End Date Taking? Authorizing Provider  aspirin 81 MG tablet Take 81 mg by mouth daily. Patient not taking: Reported on 01/03/2023    [provider]  atorvastatin (LIPITOR) 80 MG tablet TAKE ONE-HALF TABLET BY MOUTH AT BEDTIME FOR HIGH CHOLESTEROL REPLACES SIMVASTATIN Patient not taking: Reported on 01/03/2023 04/14/22   [provider]  Cholecalciferol 25 MCG (1000 UT) tablet Take 1 tablet by mouth daily. Patient not taking: Reported on 01/03/2023 09/06/20   [provider]  dexamethasone (DECADRON) 4 MG tablet Take 2 tablets (8 mg) by mouth daily x 2 days starting the day after cisplatin chemotherapy. Take with food. Patient not taking: Reported on 01/03/2023 11/16/22   [name], MD  empagliflozin (JARDIANCE) 10 MG TABS tablet Take by mouth. Patient not taking: Reported on 01/03/2023    [provider]  gabapentin (NEURONTIN) 300 MG capsule TAKE 1 CAPSULE BY MOUTH TWO TIMES  A DAY Patient not taking: Reported on 01/03/2023 11/22/21   [provider]  lisinopril (PRINIVIL,ZESTRIL) 5 MG tablet Take by mouth. Take 1 tab by mouth once daily Patient not taking: Reported on 01/03/2023 03/15/17   [provider]  metFORMIN (GLUCOPHAGE) 500 MG tablet Take 500 mg by mouth 2 (two) times daily. Patient not taking: Reported on 01/03/2023    [provider]  metoprolol tartrate (LOPRESSOR) 50 MG tablet Take 50 mg by mouth 2 (two) times daily. Patient  not taking: Reported on 01/03/2023 03/15/17   [provider]  nystatin (MYCOSTATIN) 100000 UNIT/ML suspension Take 5 mLs (500,000 Units total) by mouth 4 (four) times daily. Swish and Spit Patient not taking: Reported on 01/03/2023 12/16/22   [name], MD  ondansetron (ZOFRAN) 8 MG tablet Take 1 tablet (8 mg total) by mouth every 8 (eight) hours as needed for nausea or vomiting. Start on the third day after cisplatin. Patient not taking: Reported on 01/03/2023 11/16/22   [name], MD  oxyCODONE (OXY IR/ROXICODONE) 5 MG immediate release tablet Take 1 tablet (5 mg total) by mouth every 6 (six) hours as needed for severe pain. 12/09/22   [name], MD  prochlorperazine (COMPAZINE) 10 MG tablet Take 1 tablet (10 mg total) by mouth every 6 (six) hours as needed (Nausea or vomiting). Patient not taking: Reported on 01/03/2023 11/16/22   [name], MD  simvastatin (ZOCOR) 40 MG tablet Take 40 mg by mouth daily. Patient not taking: Reported on 01/03/2023 03/15/17   [provider]  sucralfate (CARAFATE) 1 GM/10ML suspension Take 10 mLs (1 g total) by mouth 4 (four) times daily -  with meals and at bedtime. Patient not taking: Reported on 01/03/2023 12/21/22   Borders, Daryl Eastern, NP  vitamin B-12 (CYANOCOBALAMIN) 1000 MCG tablet Take 1,000 mcg by mouth daily. Patient not taking: Reported on 01/03/2023    [provider]   Physical Exam: Vitals:   01/03/23 1330 01/03/23 1400 01/03/23 1430 01/03/23 1450  BP: 112/72 (!) 94/48 110/80   Pulse: 75 73 72   Resp: 15 14 16    Temp:    97.7 F (36.5 C)  TempSrc:    Oral  SpO2: 99% 98% 98%   Weight:      Height:       Constitutional: appears frail, cachectic, malnourished, calm Eyes: PERRL, lids and conjunctivae normal HENMT: Mucous membranes are dry.  Unable to visualize posterior pharynx as patient not able to fully open his mouth.  Hearing appropriate.  Trachea appears midline.  Large mass protruding under skin near [place] Neck: normal,  supple, no masses, no thyromegaly Respiratory: clear to auscultation bilaterally, no wheezing, no crackles. Normal respiratory effort. No accessory muscle use.  Cardiovascular: Regular rate and rhythm, no murmurs / rubs / gallops. No extremity edema. 2+ pedal pulses. No carotid bruits.  Abdomen: Scaphoid abdomen, no tenderness, no masses palpated, no hepatosplenomegaly. Bowel sounds positive.  Musculoskeletal: no clubbing / cyanosis. No joint deformity upper and lower extremities. Good ROM, no contractures, no atrophy. Normal muscle tone.  Skin: no rashes, lesions, ulcers. No induration Neurologic: Sensation intact. Strength 5/5 in all 4.  Bilateral upper extremity action tremors Psychiatric: Normal judgment and insight. Alert and oriented x 3. Normal mood.   EKG: independently reviewed, showing sinus rhythm with rate of 81, QTc 381 with low amplitude  Chest x-ray on Admission: I personally reviewed and I agree with radiologist reading as below.  DG Chest [facility] 1 [address]  Result Date: 01/03/2023 CLINICAL DATA:  Syncope EXAM: PORTABLE CHEST 1 VIEW COMPARISON:  X-ray 12/30/2022 FINDINGS: Hyperinflation. Normal cardiopericardial silhouette when adjusting for technique. No consolidation, pneumothorax, effusion or edema. Overlapping cardiac leads. Degenerative changes of the spine. Fixation pin along the distal left clavicle. IMPRESSION: Hyperinflation.  No acute cardiopulmonary disease. Electronically Signed   By: Karen Kays M.D.   On: 01/03/2023 15:10   CT Head Wo Contrast  Result Date: 01/03/2023 CLINICAL DATA:  Trauma.  History of head neck cancer. EXAM: CT HEAD WITHOUT CONTRAST CT CERVICAL SPINE WITHOUT CONTRAST TECHNIQUE: Multidetector CT imaging of the head and cervical spine was performed following the standard protocol without intravenous contrast. Multiplanar CT image reconstructions of the cervical spine were also generated. RADIATION DOSE REDUCTION: This exam was performed according to the  departmental dose-optimization program which includes automated exposure control, adjustment of the mA and/or kV according to patient size and/or use of iterative reconstruction technique. COMPARISON:  MR brain 03/29/2020 FINDINGS: CT HEAD FINDINGS Brain: No evidence of acute infarction, hemorrhage, hydrocephalus, extra-axial collection or mass lesion/mass effect. There is heterogeneous low-attenuation within the subcortical and periventricular white matter compatible with chronic microvascular disease. Vascular: No hyperdense vessel or unexpected calcification. Skull: Normal. Negative for fracture or focal lesion. Sinuses/Orbits: No acute finding. Other: None. CT CERVICAL SPINE FINDINGS Alignment: The alignment of the cervical spine is normal. Skull base and vertebrae: No signs of acute fracture or subluxation. Soft tissues and spinal canal: No prevertebral fluid or swelling. No visible canal hematoma. Disc levels: There are large, bulky, flowing ventral syndesmophytes throughout the cervical spine. Disc space narrowing and calcification identified at C3-4. There is also disc space narrowing at C5-6 and C7-T1. Mild bilateral facet arthropathy. Upper chest: Right base of tongue/oropharyngeal mass is again identified. This is only partially visualized on the current study, image 39/4. Other: None IMPRESSION: 1. No evidence for acute intracranial abnormality. 2. Chronic microvascular disease. 3. No evidence for acute cervical spine fracture or subluxation. 4. 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 5. 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 This is only partially visualized on the current study. See PET-CT report from 11/15/2022 for details. Electronically Signed   By: Signa Kell M.D.   On: 01/03/2023 13:37   CT Cervical Spine Wo Contrast  Result Date: 01/03/2023 CLINICAL DATA:  Trauma.  History of head neck cancer. EXAM: CT HEAD WITHOUT CONTRAST CT CERVICAL SPINE WITHOUT  CONTRAST TECHNIQUE: Multidetector CT imaging of the head and cervical spine was performed following the standard protocol without intravenous contrast. Multiplanar CT image reconstructions of the cervical spine were also generated. RADIATION DOSE REDUCTION: This exam was performed according to the departmental dose-optimization program which includes automated exposure control, adjustment of the mA and/or kV according to patient size and/or use of iterative reconstruction technique. COMPARISON:  MR brain 03/29/2020 FINDINGS: CT HEAD FINDINGS Brain: No evidence of acute infarction, hemorrhage, hydrocephalus, extra-axial collection or mass lesion/mass effect. There is heterogeneous low-attenuation within the subcortical and periventricular white matter compatible with chronic microvascular disease. Vascular: No hyperdense vessel or unexpected calcification. Skull: Normal. Negative for fracture or focal lesion. Sinuses/Orbits: No acute finding. Other: None. CT CERVICAL SPINE FINDINGS Alignment: The alignment of the cervical spine is normal. Skull base and vertebrae: No signs of acute fracture or subluxation. Soft tissues and spinal canal: No prevertebral fluid or swelling. No visible canal hematoma. Disc levels: There are large, bulky, flowing ventral syndesmophytes throughout the cervical spine. Disc space narrowing and calcification identified  at C3-4. There is also disc space narrowing at C5-6 and C7-T1. Mild bilateral facet arthropathy. Upper chest: Right base of tongue/oropharyngeal mass is again identified. This is only partially visualized on the current study, image 39/4. Other: None IMPRESSION: 1. No evidence for acute intracranial abnormality. 2. Chronic microvascular disease. 3. No evidence for acute cervical spine fracture or subluxation. 4. 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 5. 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 This is only partially visualized on the  current study. See PET-CT report from 11/15/2022 for details. Electronically Signed   By: Signa Kell M.D.   On: 01/03/2023 13:37    Labs on Admission: I have personally reviewed following labs  CBC: Recent Labs  Lab 12/28/22 1338 12/30/22 0812 12/31/22 0915 01/03/23 0849 01/03/23 1033  WBC 8.4 12.4* 9.2 7.2 7.5  NEUTROABS 6.5 10.2* 7.2 5.8  --   HGB 14.6 15.3 14.2 14.1 14.9  HCT 42.0 45.0 41.2 40.3 42.6  MCV 92.7 93.6 92.6 91.0 91.4  PLT 199 182 158 111* 129*   Basic Metabolic Panel: Recent Labs  Lab 12/28/22 1338 12/30/22 0812 12/31/22 0915 01/03/23 0849 01/03/23 0850 01/03/23 1032 01/03/23 1033  NA 134* 133* 132*  --  137  --  137  K 5.1 5.0 3.8  --  3.7  --  4.3  CL 93* 95* 96*  --  98  --  98  CO2 26 18* 18*  --  22  --  21*  GLUCOSE 162* 205* 159*  --  172*  --  179*  BUN 22 25* 24*  --  21  --  22  CREATININE 0.84 1.18 1.00  --  0.87  --  0.92  CALCIUM 9.3 9.3 8.8*  --  8.9  --  9.2  MG  --  1.9  --  1.9  --  2.1  --    GFR: Estimated Creatinine Clearance: 72.6 mL/min (by C-G formula based on SCr of 0.92 mg/dL).  Liver Function Tests: Recent Labs  Lab 12/30/22 0812 12/31/22 0915 01/03/23 0850  AST 62* 38 24  ALT 31 26 19   ALKPHOS 76 65 68  BILITOT 1.8* 1.5* 1.4*  PROT 7.6 6.8 6.7  ALBUMIN 3.9 3.5 3.4*   This document was prepared using Dragon Voice Recognition software and may include unintentional dictation errors.  Dr. Sedalia Muta [facility]  If 7PM-7AM, please contact overnight-coverage provider If 7AM-7PM, please contact day attending provider [email or web address]  01/03/2023, 4:49 PM

## 2023-01-03 NOTE — Assessment & Plan Note (Addendum)
Acetaminophen 1000 mg IV every 6 hours as needed for mild pain, fever, headache, 1 day ordered Morphine 2 mg IV every 4 hours as needed for moderate pain, 1 day ordered; fentanyl 12.5 mcg IV every 3 hours as needed for severe pain, 18 hours ordered AM team to reevaluate patient at bedside for continued opioid pain requirements

## 2023-01-04 ENCOUNTER — Ambulatory Visit: Payer: Medicare Other

## 2023-01-04 ENCOUNTER — Ambulatory Visit: Payer: Medicare Other | Admitting: Speech Pathology

## 2023-01-04 DIAGNOSIS — I214 Non-ST elevation (NSTEMI) myocardial infarction: Secondary | ICD-10-CM

## 2023-01-04 DIAGNOSIS — Z515 Encounter for palliative care: Secondary | ICD-10-CM | POA: Diagnosis not present

## 2023-01-04 DIAGNOSIS — I4892 Unspecified atrial flutter: Secondary | ICD-10-CM | POA: Diagnosis not present

## 2023-01-04 LAB — BASIC METABOLIC PANEL
Anion gap: 11 (ref 5–15)
BUN: 22 mg/dL (ref 8–23)
CO2: 22 mmol/L (ref 22–32)
Calcium: 7.9 mg/dL — ABNORMAL LOW (ref 8.9–10.3)
Chloride: 104 mmol/L (ref 98–111)
Creatinine, Ser: 0.64 mg/dL (ref 0.61–1.24)
GFR, Estimated: >60 mL/min (ref >60)
Glucose, Bld: 114 mg/dL — ABNORMAL HIGH (ref 70–99)
Potassium: 3.2 mmol/L — ABNORMAL LOW (ref 3.5–5.1)
Sodium: 137 mmol/L (ref 135–145)

## 2023-01-04 LAB — CBC
HCT: 34.5 % — ABNORMAL LOW (ref 39.0–52.0)
Hemoglobin: 12 g/dL — ABNORMAL LOW (ref 13.0–17.0)
MCH: 32.2 pg (ref 26.0–34.0)
MCHC: 34.8 g/dL (ref 30.0–36.0)
MCV: 92.5 fL (ref 80.0–100.0)
Platelets: 96 10*3/uL — ABNORMAL LOW (ref 150–400)
RBC: 3.73 MIL/uL — ABNORMAL LOW (ref 4.22–5.81)
RDW: 12.2 % (ref 11.5–15.5)
WBC: 5.6 10*3/uL (ref 4.0–10.5)
nRBC: 0 % (ref 0.0–0.2)

## 2023-01-04 LAB — HEMOGLOBIN A1C
Hgb A1c MFr Bld: 7.5 % — ABNORMAL HIGH (ref 4.8–5.6)
Mean Plasma Glucose: 168.55 mg/dL

## 2023-01-04 LAB — ECHOCARDIOGRAM COMPLETE
AR max vel: 1.55 cm2
AV Area VTI: 1.52 cm2
AV Area mean vel: 1.46 cm2
AV Mean grad: 6.5 mmHg
AV Peak grad: 12.9 mmHg
Ao pk vel: 1.8 m/s
Area-P 1/2: 3.42 cm2
Height: 69 in
S' Lateral: 3.7 cm
Weight: 2751.34 oz

## 2023-01-04 LAB — CBG MONITORING, ED
Glucose-Capillary: 119 mg/dL — ABNORMAL HIGH (ref 70–99)
Glucose-Capillary: 122 mg/dL — ABNORMAL HIGH (ref 70–99)
Glucose-Capillary: 85 mg/dL (ref 70–99)

## 2023-01-04 LAB — TROPONIN I (HIGH SENSITIVITY): Troponin I (High Sensitivity): 2721 ng/L (ref <18)

## 2023-01-04 LAB — HEPARIN LEVEL (UNFRACTIONATED)
Heparin Unfractionated: 0.29 IU/mL — ABNORMAL LOW (ref 0.30–0.70)
Heparin Unfractionated: 0.27 IU/mL — ABNORMAL LOW (ref 0.30–0.70)
Heparin Unfractionated: 0.68 IU/mL (ref 0.30–0.70)

## 2023-01-04 MED ORDER — POTASSIUM CHLORIDE 20 MEQ PO PACK
40.0000 meq | PACK | Freq: Once | ORAL | Status: AC
  Administered 2023-01-04: 40 meq via ORAL
  Filled 2023-01-04: qty 2

## 2023-01-04 MED ORDER — SODIUM CHLORIDE 0.9 % IV SOLN: INTRAVENOUS | Status: DC

## 2023-01-04 MED ORDER — HEPARIN BOLUS VIA INFUSION
1150.0000 [IU] | Freq: Once | INTRAVENOUS | Status: AC
  Administered 2023-01-04: 1150 [IU] via INTRAVENOUS
  Filled 2023-01-04: qty 1150

## 2023-01-04 MED ORDER — ASPIRIN 81 MG PO TBEC
81.0000 mg | DELAYED_RELEASE_TABLET | Freq: Every day | ORAL | Status: DC
  Administered 2023-01-04 – 2023-01-11 (×7): 81 mg via ORAL
  Filled 2023-01-04 (×8): qty 1

## 2023-01-04 MED ORDER — METOPROLOL TARTRATE 25 MG PO TABS
12.5000 mg | ORAL_TABLET | Freq: Two times a day (BID) | ORAL | Status: DC
  Administered 2023-01-04 – 2023-01-05 (×3): 12.5 mg via ORAL
  Filled 2023-01-04 (×3): qty 1

## 2023-01-04 MED ORDER — CEFAZOLIN SODIUM-DEXTROSE 2-4 GM/100ML-% IV SOLN: 2.0000 g | Freq: Once | INTRAVENOUS | Status: DC

## 2023-01-04 NOTE — ED Notes (Signed)
Pt in bed. Cardiac, bp and pulse monitor all on. Call bell within reach

## 2023-01-04 NOTE — ED Notes (Signed)
Pt sitting up in bed, pt has no complaints at this time.

## 2023-01-04 NOTE — ED Notes (Signed)
RN verified that pt most recent bp reading correct. Provider notified.

## 2023-01-04 NOTE — ED Notes (Signed)
Messaged MD about d/c dilt gtt, per md Lai and End pt can come off dilt gtt, states that since pt is now back in sinus with rate under 100.  D/c dilt gtt.

## 2023-01-04 NOTE — ED Notes (Signed)
Patient transitioned to hospital bed to promote comfort.  Patient resting in bed free from sign of distress. Breathing unlabored speaking in full sentences with symmetric chest rise and fall. Bed low and locked with side rails raised x3. Call bell in reach and monitor in place.

## 2023-01-04 NOTE — ED Notes (Signed)
ED TO INPATIENT HANDOFF REPORT  ED Nurse Name and Phone #: Ned Clines Name/Age/Gender Kenneth Gilmore 72 y.o. male Room/Bed: ED01A/ED01A  Code Status   Code Status: Full Code  Home/SNF/Other Home Patient oriented to: self, place, and time Is this baseline? Yes   Triage Complete: Triage complete  Chief Complaint NSTEMI (non-ST elevated myocardial infarction) [facility]) [I21.4]  Triage Note First Nurse Note:  Patient sent to ED from [facility]. Patient with Syncopal episode last night.  Hx head and neck cancer.  Pt cannot open mouth, found aspiration.  Also c/o weakness.  Per Cancer Center, patient needs GI consult and peg.  Pt to ED for LOC this morning while getting out of shower. Denies hitting head, +blood thinners.  Pt also reports difficulty swallowing started with throat cancer 3 months ago, needs feeding tube.  RR even and unlabored. NAD noted    Allergies No Known Allergies  Level of Care/Admitting Diagnosis ED Disposition     ED Disposition  Admit   Condition  --   Comment  Hospital Area: [facility] [[id number]]  Level of Care: Progressive [102]  Admit to Progressive based on following criteria: CARDIOVASCULAR & THORACIC of moderate stability with acute coronary syndrome symptoms/low risk myocardial infarction/hypertensive urgency/arrhythmias/heart failure potentially compromising stability and stable post cardiovascular intervention patients.  Covid Evaluation: Asymptomatic - no recent exposure (last 10 days) testing not required  Diagnosis: NSTEMI (non-ST elevated myocardial infarction) [facility]) [[id number]]  Admitting Physician: Lovenia Kim [[id number]]  Attending Physician: COX, AMY N [id number]  Certification:: I certify this patient will need inpatient services for at least 2 midnights  Estimated Length of Stay: 3          B Medical/Surgery History Past Medical History:  Diagnosis Date   Diabetes mellitus without complication (HCC)     Hyperlipidemia    Hypertension    Oropharyngeal cancer (HCC)    Past Surgical History:  Procedure Laterality Date   arm surgery Left    CORONARY ANGIOPLASTY WITH STENT PLACEMENT     left side rotator cuff surgery     SHOULDER SURGERY       A IV Location/Drains/Wounds Patient Lines/Drains/Airways Status     Active Line/Drains/Airways     Name Placement date Placement time Site Days   Peripheral IV 01/03/23 20 G Right Antecubital 01/03/23  1348  Antecubital  1   Peripheral IV 01/03/23 20 G 1" Left Forearm 01/03/23  2052  Forearm  1            Intake/Output Last 24 hours  Intake/Output Summary (Last 24 hours) at 01/04/2023 1559 Last data filed at 01/04/2023 1339 Gross per 24 hour  Intake 2009.14 ml  Output 350 ml  Net 1659.14 ml    Labs/Imaging Results for orders placed or performed during the hospital encounter of 01/03/23 (from the past 48 hour(s))  Troponin I (High Sensitivity)     Status: Abnormal   Collection Time: 01/03/23 10:32 AM  Result Value Ref Range   Troponin I (High Sensitivity) 2,925 (HH) <18 ng/L    Comment: CRITICAL RESULT CALLED TO, READ BACK BY AND VERIFIED WITH ASHLEY TREXLER 01/04/23 1230 MW (NOTE) Elevated high sensitivity troponin I (hsTnI) values and significant  changes across serial measurements may suggest ACS but many other  chronic and acute conditions are known to elevate hsTnI results.  Refer to the "Links" section for chest pain algorithms and additional  guidance. Performed at [facility], [address] [place], [place] [address]   Magnesium     Status: None   Collection Time: 01/03/23 10:32 AM  Result Value Ref Range   Magnesium 2.1 1.7 - 2.4 mg/dL    Comment: Performed at [facility], [address] [place], [place] [address]  CBC     Status: Abnormal   Collection Time: 01/03/23 10:33 AM  Result Value Ref Range   WBC 7.5 4.0 - 10.5 K/uL   RBC 4.66 4.22 - 5.81 MIL/uL   Hemoglobin 14.9 13.0 - 17.0  g/dL   HCT 29.5 62.1 - 30.8 %   MCV 91.4 80.0 - 100.0 fL   MCH 32.0 26.0 - 34.0 pg   MCHC 35.0 30.0 - 36.0 g/dL   RDW 65.7 84.6 - 96.2 %   Platelets 129 (L) 150 - 400 K/uL   nRBC 0.0 0.0 - 0.2 %    Comment: Performed at [facility], [address] [place], [place] [address]  Basic metabolic panel     Status: Abnormal   Collection Time: 01/03/23 10:33 AM  Result Value Ref Range   Sodium 137 135 - 145 mmol/L   Potassium 4.3 3.5 - 5.1 mmol/L   Chloride 98 98 - 111 mmol/L   CO2 21 (L) 22 - 32 mmol/L   Glucose, Bld 179 (H) 70 - 99 mg/dL    Comment: Glucose reference range applies only to samples taken after fasting for at least 8 hours.   BUN 22 8 - 23 mg/dL   Creatinine, Ser 1.32 0.61 - 1.24 mg/dL   Calcium 9.2 8.9 - 44.0 mg/dL   GFR, Estimated >10 >27 mL/min    Comment: (NOTE) Calculated using the CKD-EPI Creatinine Equation (2021)    Anion gap 18 (H) 5 - 15    Comment: Performed at [facility], [address] [place], [place] [address]  Troponin I (High Sensitivity)     Status: Abnormal   Collection Time: 01/03/23  1:54 PM  Result Value Ref Range   Troponin I (High Sensitivity) 3,672 (HH) <18 ng/L    Comment: CRITICAL VALUE NOTED. VALUE IS CONSISTENT WITH PREVIOUSLY REPORTED/CALLED VALUE MU (NOTE) Elevated high sensitivity troponin I (hsTnI) values and significant  changes across serial measurements may suggest ACS but many other  chronic and acute conditions are known to elevate hsTnI results.  Refer to the "Links" section for chest pain algorithms and additional  guidance. Performed at [facility], [address] [place], [place] [address]   APTT     Status: None   Collection Time: 01/03/23  3:14 PM  Result Value Ref Range   aPTT 24 24 - 36 seconds    Comment: Performed at [facility], [address] [place], [place] [address]  Protime-INR     Status: Abnormal   Collection Time: 01/03/23  3:14 PM  Result Value Ref Range    Prothrombin Time 15.9 (H) 11.4 - 15.2 seconds   INR 1.3 (H) 0.8 - 1.2    Comment: (NOTE) INR goal varies based on device and disease states. Performed at [facility], [address] [place], [place] [address]   Heparin level (unfractionated)     Status: Abnormal   Collection Time: 01/03/23  3:14 PM  Result Value Ref Range   Heparin Unfractionated <0.10 (L) 0.30 - 0.70 IU/mL    Comment: (NOTE) The clinical reportable range upper limit is being lowered to >1.10 to align with the FDA approved guidance for the current  laboratory assay.  If heparin results are below expected values, and patient dosage has  been confirmed, suggest follow up testing of antithrombin III levels. Performed at [facility], [address] [place], [place] [address]   CBG monitoring, ED     Status: Abnormal   Collection Time: 01/03/23  8:14 PM  Result Value Ref Range   Glucose-Capillary 133 (H) 70 - 99 mg/dL    Comment: Glucose reference range applies only to samples taken after fasting for at least 8 hours.  Heparin level (unfractionated)     Status: Abnormal   Collection Time: 01/04/23 12:41 AM  Result Value Ref Range   Heparin Unfractionated 0.29 (L) 0.30 - 0.70 IU/mL    Comment: (NOTE) The clinical reportable range upper limit is being lowered to >1.10 to align with the FDA approved guidance for the current laboratory assay.  If heparin results are below expected values, and patient dosage has  been confirmed, suggest follow up testing of antithrombin III levels. Performed at [facility], [address] [place], [place] [address]   Basic metabolic panel     Status: Abnormal   Collection Time: 01/04/23  4:37 AM  Result Value Ref Range   Sodium 137 135 - 145 mmol/L   Potassium 3.2 (L) 3.5 - 5.1 mmol/L   Chloride 104 98 - 111 mmol/L   CO2 22 22 - 32 mmol/L   Glucose, Bld 114 (H) 70 - 99 mg/dL    Comment: Glucose reference range applies only to samples taken  after fasting for at least 8 hours.   BUN 22 8 - 23 mg/dL   Creatinine, Ser 0.98 0.61 - 1.24 mg/dL   Calcium 7.9 (L) 8.9 - 10.3 mg/dL   GFR, Estimated >11 >91 mL/min    Comment: (NOTE) Calculated using the CKD-EPI Creatinine Equation (2021)    Anion gap 11 5 - 15    Comment: Performed at [facility], [address] [place], [place] [address]  CBC     Status: Abnormal   Collection Time: 01/04/23  4:37 AM  Result Value Ref Range   WBC 5.6 4.0 - 10.5 K/uL   RBC 3.73 (L) 4.22 - 5.81 MIL/uL   Hemoglobin 12.0 (L) 13.0 - 17.0 g/dL   HCT 56.2 (L) 13.0 - 86.5 %   MCV 92.5 80.0 - 100.0 fL   MCH 32.2 26.0 - 34.0 pg   MCHC 34.8 30.0 - 36.0 g/dL   RDW 78.4 69.6 - 29.5 %   Platelets 96 (L) 150 - 400 K/uL    Comment: Immature Platelet Fraction may be clinically indicated, consider ordering this additional test [id number] PLATELET COUNT CONFIRMED BY SMEAR    nRBC 0.0 0.0 - 0.2 %    Comment: Performed at [facility], [address] [place], [place] [address]  Hemoglobin A1c     Status: Abnormal   Collection Time: 01/04/23  4:37 AM  Result Value Ref Range   Hgb A1c MFr Bld 7.5 (H) 4.8 - 5.6 %    Comment: (NOTE) Pre diabetes:          5.7%-6.4%  Diabetes:              >6.4%  Glycemic control for   <7.0% adults with diabetes    Mean Plasma Glucose 168.55 mg/dL    Comment: Performed at [facility], [address], [place], [place] [address]  CBG monitoring, ED     Status: Abnormal   Collection  Time: 01/04/23  7:20 AM  Result Value Ref Range   Glucose-Capillary 119 (H) 70 - 99 mg/dL    Comment: Glucose reference range applies only to samples taken after fasting for at least 8 hours.  Heparin level (unfractionated)     Status: Abnormal   Collection Time: 01/04/23  9:10 AM  Result Value Ref Range   Heparin Unfractionated 0.27 (L) 0.30 - 0.70 IU/mL    Comment: (NOTE) The clinical reportable range upper limit is being lowered to >1.10 to align with the FDA  approved guidance for the current laboratory assay.  If heparin results are below expected values, and patient dosage has  been confirmed, suggest follow up testing of antithrombin III levels. Performed at [facility], [address] [place], [place] [address]   Troponin I (High Sensitivity)     Status: Abnormal   Collection Time: 01/04/23  9:10 AM  Result Value Ref Range   Troponin I (High Sensitivity) 2,721 (HH) <18 ng/L    Comment: CRITICAL VALUE NOTED. VALUE IS CONSISTENT WITH PREVIOUSLY REPORTED/CALLED VALUE HNM (NOTE) Elevated high sensitivity troponin I (hsTnI) values and significant  changes across serial measurements may suggest ACS but many other  chronic and acute conditions are known to elevate hsTnI results.  Refer to the "Links" section for chest pain algorithms and additional  guidance. Performed at [facility], [address] [place], [place] [address]   CBG monitoring, ED     Status: Abnormal   Collection Time: 01/04/23 11:21 AM  Result Value Ref Range   Glucose-Capillary 122 (H) 70 - 99 mg/dL    Comment: Glucose reference range applies only to samples taken after fasting for at least 8 hours.   ECHOCARDIOGRAM COMPLETE  Result Date: 01/04/2023    ECHOCARDIOGRAM REPORT   Patient Name:   Kenneth Gilmore Date of Exam: 01/03/2023 Medical Rec #:  [id number]       Height:       69.0 in Accession #:    [id number]      Weight:       172.0 lb Date of Birth:  March 21, 1951        BSA:          1.938 m Patient Age:    72 years        BP:           105/55 mmHg Patient Gender: M               HR:           76 bpm. Exam Location:  [facility] Procedure: 2D Echo, Cardiac Doppler, Color Doppler and Intracardiac            Opacification Agent Indications:     I48.92 Atrial Flutter  History:         Patient has no prior history of Echocardiogram examinations.                  Risk Factors:Hypertension, Dyslipidemia and Diabetes.  Sonographer:     Daphine Deutscher RDCS  Referring Phys:  [id number] AMY N COX Diagnosing Phys: Yvonne Kendall MD IMPRESSIONS  1. There is a 3.3 x 1.7 cm mural thrombus near the left ventricular apex. Left ventricular ejection fraction, by estimation, is 35 to 40%. The left ventricle has moderately decreased function. The left ventricle demonstrates regional wall motion abnormalities (see scoring diagram/findings for description). Left ventricular diastolic parameters are consistent with Grade I diastolic dysfunction (impaired relaxation). There is akinesis  of the left ventricular, mid-apical anterior wall and anteroseptal wall. There is akinesis of the left ventricular, apical inferior segment. There is akinesis of the left ventricular, apical segment.  2. Right ventricular systolic function is normal. The right ventricular size is mildly enlarged. There is normal pulmonary artery systolic pressure.  3. The mitral valve is abnormal. Trivial mitral valve regurgitation. No evidence of mitral stenosis.  4. The aortic valve has an indeterminant number of cusps. There is moderate thickening of the aortic valve. Aortic valve regurgitation is trivial. Aortic valve sclerosis/calcification is present, without any evidence of aortic stenosis.  5. The inferior vena cava is normal in size with greater than 50% respiratory variability, suggesting right atrial pressure of 3 mmHg. FINDINGS  Left Ventricle: There is a 3.3 x 1.7 cm mural thrombus near the left ventricular apex. Left ventricular ejection fraction, by estimation, is 35 to 40%. The left ventricle has moderately decreased function. The left ventricle demonstrates regional wall motion abnormalities. Definity contrast agent was given IV to delineate the left ventricular endocardial borders. The left ventricular internal cavity size was normal in size. There is no left ventricular hypertrophy. Left ventricular diastolic parameters are consistent with Grade I diastolic dysfunction (impaired relaxation). Right  Ventricle: The right ventricular size is mildly enlarged. No increase in right ventricular wall thickness. Right ventricular systolic function is normal. There is normal pulmonary artery systolic pressure. The tricuspid regurgitant velocity is 2.86  m/s, and with an assumed right atrial pressure of 3 mmHg, the estimated right ventricular systolic pressure is 35.7 mmHg. Left Atrium: Left atrial size was normal in size. Right Atrium: Right atrial size was normal in size. Pericardium: There is no evidence of pericardial effusion. Mitral Valve: The mitral valve is abnormal. There is mild thickening of the anterior mitral valve leaflet(s). Trivial mitral valve regurgitation. No evidence of mitral valve stenosis. Tricuspid Valve: The tricuspid valve is normal in structure. Tricuspid valve regurgitation is mild. Aortic Valve: The aortic valve has an indeterminant number of cusps. There is moderate thickening of the aortic valve. Aortic valve regurgitation is trivial. Aortic valve sclerosis/calcification is present, without any evidence of aortic stenosis. Aortic  valve mean gradient measures 6.5 mmHg. Aortic valve peak gradient measures 12.9 mmHg. Aortic valve area, by VTI measures 1.52 cm. Pulmonic Valve: The pulmonic valve was not well visualized. Pulmonic valve regurgitation is trivial. No evidence of pulmonic stenosis. Aorta: The aortic root and ascending aorta are structurally normal, with no evidence of dilitation. Pulmonary Artery: The pulmonary artery is of normal size. Venous: The inferior vena cava is normal in size with greater than 50% respiratory variability, suggesting right atrial pressure of 3 mmHg. IAS/Shunts: The interatrial septum was not well visualized.  LEFT VENTRICLE PLAX 2D LVIDd:         4.80 cm   Diastology LVIDs:         3.70 cm   LV e' medial:    6.64 cm/s LV PW:         1.00 cm   LV E/e' medial:  11.9 LV IVS:        1.00 cm   LV e' lateral:   7.56 cm/s LVOT diam:     2.00 cm   LV E/e' lateral:  10.5 LV SV:         54 LV SV Index:   28 LVOT Area:     3.14 cm  RIGHT VENTRICLE             IVC  RV Basal diam:  4.30 cm     IVC diam: 1.20 cm RV S prime:     18.20 cm/s TAPSE (M-mode): 2.8 cm LEFT ATRIUM             Index        RIGHT ATRIUM           Index LA diam:        4.50 cm 2.32 cm/m   RA Area:     10.70 cm LA Vol (A2C):   40.1 ml 20.70 ml/m  RA Volume:   24.10 ml  12.44 ml/m LA Vol (A4C):   53.3 ml 27.51 ml/m LA Biplane Vol: 46.0 ml 23.74 ml/m  AORTIC VALVE AV Area (Vmax):    1.55 cm AV Area (Vmean):   1.46 cm AV Area (VTI):     1.52 cm AV Vmax:           179.50 cm/s AV Vmean:          120.500 cm/s AV VTI:            0.359 m AV Peak Grad:      12.9 mmHg AV Mean Grad:      6.5 mmHg LVOT Vmax:         88.77 cm/s LVOT Vmean:        56.100 cm/s LVOT VTI:          0.173 m LVOT/AV VTI ratio: 0.48  AORTA Ao Root diam: 3.40 cm Ao Asc diam:  3.40 cm MITRAL VALVE                TRICUSPID VALVE MV Area (PHT): 3.42 cm     TR Peak grad:   32.7 mmHg MV Decel Time: 222 msec     TR Vmax:        286.00 cm/s MV E velocity: 79.20 cm/s MV A velocity: 111.00 cm/s  SHUNTS MV E/A ratio:  0.71         Systemic VTI:  0.17 m                             Systemic Diam: 2.00 cm Yvonne Kendall MD Electronically signed by Yvonne Kendall MD Signature Date/Time: 01/04/2023/10:24:53 AM    Final    DG Chest Port 1 View  Result Date: 01/03/2023 CLINICAL DATA:  Syncope EXAM: PORTABLE CHEST 1 VIEW COMPARISON:  X-ray 12/30/2022 FINDINGS: Hyperinflation. Normal cardiopericardial silhouette when adjusting for technique. No consolidation, pneumothorax, effusion or edema. Overlapping cardiac leads. Degenerative changes of the spine. Fixation pin along the distal left clavicle. IMPRESSION: Hyperinflation.  No acute cardiopulmonary disease. Electronically Signed   By: Karen Kays M.D.   On: 01/03/2023 15:10   CT Head Wo Contrast  Result Date: 01/03/2023 CLINICAL DATA:  Trauma.  History of head neck cancer. EXAM: CT HEAD WITHOUT  CONTRAST CT CERVICAL SPINE WITHOUT CONTRAST TECHNIQUE: Multidetector CT imaging of the head and cervical spine was performed following the standard protocol without intravenous contrast. Multiplanar CT image reconstructions of the cervical spine were also generated. RADIATION DOSE REDUCTION: This exam was performed according to the departmental dose-optimization program which includes automated exposure control, adjustment of the mA and/or kV according to patient size and/or use of iterative reconstruction technique. COMPARISON:  MR brain 03/29/2020 FINDINGS: CT HEAD FINDINGS Brain: No evidence of acute infarction, hemorrhage, hydrocephalus, extra-axial collection or mass lesion/mass effect. There is heterogeneous low-attenuation within the subcortical and periventricular white  matter compatible with chronic microvascular disease. Vascular: No hyperdense vessel or unexpected calcification. Skull: Normal. Negative for fracture or focal lesion. Sinuses/Orbits: No acute finding. Other: None. CT CERVICAL SPINE FINDINGS Alignment: The alignment of the cervical spine is normal. Skull base and vertebrae: No signs of acute fracture or subluxation. Soft tissues and spinal canal: No prevertebral fluid or swelling. No visible canal hematoma. Disc levels: There are large, bulky, flowing ventral syndesmophytes throughout the cervical spine. Disc space narrowing and calcification identified at C3-4. There is also disc space narrowing at C5-6 and C7-T1. Mild bilateral facet arthropathy. Upper chest: Right base of tongue/oropharyngeal mass is again identified. This is only partially visualized on the current study, image 39/4. Other: None IMPRESSION: 1. No evidence for acute intracranial abnormality. 2. Chronic microvascular disease. 3. No evidence for acute cervical spine fracture or subluxation. 4. 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 5. 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 This is  only partially visualized on the current study. See PET-CT report from 11/15/2022 for details. Electronically Signed   By: Signa Kell M.D.   On: 01/03/2023 13:37   CT Cervical Spine Wo Contrast  Result Date: 01/03/2023 CLINICAL DATA:  Trauma.  History of head neck cancer. EXAM: CT HEAD WITHOUT CONTRAST CT CERVICAL SPINE WITHOUT CONTRAST TECHNIQUE: Multidetector CT imaging of the head and cervical spine was performed following the standard protocol without intravenous contrast. Multiplanar CT image reconstructions of the cervical spine were also generated. RADIATION DOSE REDUCTION: This exam was performed according to the departmental dose-optimization program which includes automated exposure control, adjustment of the mA and/or kV according to patient size and/or use of iterative reconstruction technique. COMPARISON:  MR brain 03/29/2020 FINDINGS: CT HEAD FINDINGS Brain: No evidence of acute infarction, hemorrhage, hydrocephalus, extra-axial collection or mass lesion/mass effect. There is heterogeneous low-attenuation within the subcortical and periventricular white matter compatible with chronic microvascular disease. Vascular: No hyperdense vessel or unexpected calcification. Skull: Normal. Negative for fracture or focal lesion. Sinuses/Orbits: No acute finding. Other: None. CT CERVICAL SPINE FINDINGS Alignment: The alignment of the cervical spine is normal. Skull base and vertebrae: No signs of acute fracture or subluxation. Soft tissues and spinal canal: No prevertebral fluid or swelling. No visible canal hematoma. Disc levels: There are large, bulky, flowing ventral syndesmophytes throughout the cervical spine. Disc space narrowing and calcification identified at C3-4. There is also disc space narrowing at C5-6 and C7-T1. Mild bilateral facet arthropathy. Upper chest: Right base of tongue/oropharyngeal mass is again identified. This is only partially visualized on the current study, image 39/4. Other:  None IMPRESSION: 1. No evidence for acute intracranial abnormality. 2. Chronic microvascular disease. 3. No evidence for acute cervical spine fracture or subluxation. 4. 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 5. 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 This is only partially visualized on the current study. See PET-CT report from 11/15/2022 for details. Electronically Signed   By: Signa Kell M.D.   On: 01/03/2023 13:37    Pending Labs Unresulted Labs (From admission, onward)     Start     Ordered   01/10/23 1533  CBC  Once,   R        01/04/23 1533   01/10/23 1533  Protime-INR  Once,   R        01/04/23 1533   01/05/23 0500  CBC  Tomorrow morning,   R        01/04/23 1030   01/05/23 0500  Lipid panel  Tomorrow morning,   R        01/04/23 1321   01/04/23 1900  Heparin level (unfractionated)  Once-Timed,   TIMED        01/04/23 1030            Vitals/Pain Today's Vitals   01/04/23 1344 01/04/23 1345 01/04/23 1400 01/04/23 1521  BP:   117/70 115/67  Pulse:   64 70  Resp:  16  17  Temp:    97.9 F (36.6 C)  TempSrc:    Oral  SpO2:   99% 100%  Weight:      Height:      PainSc: 0-No pain   0-No pain    Isolation Precautions No active isolations  Medications Medications  diltiazem (CARDIZEM) 125 mg in dextrose 5% 125 mL (1 mg/mL) infusion (5 mg/hr Intravenous Rate/Dose Verify 01/04/23 1523)  ondansetron (ZOFRAN) tablet 4 mg (has no administration in time range)    Or  ondansetron (ZOFRAN) injection 4 mg (has no administration in time range)  senna-docusate (Senokot-S) tablet 1 tablet (has no administration in time range)  insulin aspart (novoLOG) injection 0-9 Units (1 Units Subcutaneous Given 01/04/23 1151)  insulin aspart (novoLOG) injection 0-5 Units (1 Units Subcutaneous Given 01/03/23 2053)  heparin bolus via infusion 4,000 Units (4,000 Units Intravenous Bolus from Bag 01/03/23 1614)    And  heparin ADULT infusion 100 units/mL  (25000 units/222mL) (1,200 Units/hr Intravenous New Bag/Given 01/04/23 1155)  morphine (PF) 2 MG/ML injection 2 mg (has no administration in time range)  fentaNYL (SUBLIMAZE) injection 12.5 mcg (has no administration in time range)  acetaminophen (OFIRMEV) IV 1,000 mg (has no administration in time range)  aspirin EC tablet 81 mg (81 mg Oral Given 01/04/23 0910)  0.9 %  sodium chloride infusion ( Intravenous New Bag/Given 01/04/23 1342)  ceFAZolin (ANCEF) IVPB 2g/100 mL premix (has no administration in time range)  diltiazem (CARDIZEM) 1 mg/mL load via infusion 15 mg (15 mg Intravenous Bolus from Bag 01/03/23 1132)  sodium chloride 0.9 % bolus 500 mL (0 mLs Intravenous Stopped 01/03/23 1607)  potassium chloride (KLOR-CON) packet 40 mEq (40 mEq Oral Given 01/04/23 0910)  heparin bolus via infusion 1,150 Units (1,150 Units Intravenous Bolus from Bag 01/04/23 1056)    Mobility walks       R Recommendations: See Admitting Provider Note  Report given to:   Additional Notes:

## 2023-01-04 NOTE — Progress Notes (Signed)
ANTICOAGULATION CONSULT NOTE  Pharmacy Consult for Heparin Indication: STEMI  No Known Allergies  Patient Measurements: Height: 5\' 9"  (175.3 cm) Weight: 78 kg (171 lb 15.3 oz) IBW/kg (Calculated) : 70.7 Heparin Dosing Weight: 78 kg  Vital Signs: Temp: 97.9 F (36.6 C) (05/14 0723) Temp Source: Oral (05/14 0723) BP: 102/62 (05/14 0700) Pulse Rate: 61 (05/14 0700)  Labs: Recent Labs    01/03/23 0849 01/03/23 0850 01/03/23 1032 01/03/23 1033 01/03/23 1354 01/03/23 1514 01/04/23 0041 01/04/23 0437 01/04/23 0910  HGB 14.1  --   --  14.9  --   --   --  12.0*  --   HCT 40.3  --   --  42.6  --   --   --  34.5*  --   PLT 111*  --   --  129*  --   --   --  96*  --   APTT  --   --   --   --   --  24  --   --   --   LABPROT  --   --   --   --   --  15.9*  --   --   --   INR  --   --   --   --   --  1.3*  --   --   --   HEPARINUNFRC  --   --   --   --   --  <0.10* 0.29*  --  0.27*  CREATININE  --  0.87  --  0.92  --   --   --  0.64  --   TROPONINIHS  --   --  2,925*  --  3,672*  --   --   --  2,721*     Estimated Creatinine Clearance: 83.5 mL/min (by C-G formula based on SCr of 0.64 mg/dL).  Assessment: 46 yoM w/ hx CAD s/p PCI and recently off rivaroxiban for DVT, admitted with syncopal episode, new onset Afib and elevated troponins. Pharmacy has been consulted to assist with systemic heparin therapy. H/H within normal limits, noted low platelet count. Per patient account, has been off xarelto for several weeks.   Goal of Therapy:  Heparin level 0.3-0.7 units/ml Monitor platelets by anticoagulation protocol: Yes   5/14 0041 HL 0.29, slightly subtherapeutic 5.14 0910 HL 0.27, subtherapeutic  Plan:  Give bolus 1150 units Increase heparin gtt to 1200 units /hr Recheck HL in 8 hours after rate change Daily HL and CBC Monitor for any bleeding complications  Elliot Gurney, PharmD, BCPS Clinical Pharmacist  01/04/2023 10:26 AM

## 2023-01-04 NOTE — Progress Notes (Signed)
PROGRESS NOTE    Kenneth Gilmore  ZOX:[id number] DOB: [birth year] DOA: 01/03/2023 PCP: [facility]  [id number]  LOS: 1 day   Brief hospital course:   Assessment & Plan: Kenneth Gilmore is a 72 year old male with non-insulin-dependent diabetes mellitus, hyperlipidemia, history of DVT on Xarelto, oropharyngeal squamous cell carcinoma stage IVa, who presents emergency department from oncology clinic for chief concerns of syncope event at home while he was in the shower.    Elevated high-sensitivity troponin -High-sensitivity troponin trended 2925, 3672, and 2721 -chest pain-free -started on heparin infusion Plan: --cont heparin gtt -Will need continued ischemic workup as last stent placement was in 2003 -Continue on aspirin 81 mg daily  New onset atrial flutter --received IV dilt 15 mg f/b dilt gtt -Large quantity of ectopy and 4 beat runs of nonsustained V. Tach --already converted to NSR and rate controlled this morning Plan: --d/c dilt gtt --start liquid lopressor 12.5 mg BID (dose limited by soft BP) -Continued on heparin infusion will need to be transitioned to [facility] prior to discharge  Syncope and collapse --CT head was read as no acute intracranial abnormality and cervical spine without contrast was read as no evidence of acute cervical spine fracture or subluxation -Suspect due to tachycardia in the setting of hypotension and some vasodilation from hot shower.  Also weakness from not being able to eat and weight loss. Plan: --cont tele --cont MIVF --orthostatic BP  Protein-calorie malnutrition, severe (HCC) Difficulty swallowing due to mech block from cancer Presumed multifactorial in setting of stage IV oropharyngeal cancer along with mechanical block of esophagus Dietitian has been consulted Patient endorses that he would like G-tube/PEG tube --IR for G-tube placement on 5/20 (needs to wait 5 days after ASA 325) --No Ensure or supplements, per  pt --dys 1 diet --cont MIVF  Diabetes mellitus type 2, noninsulin dependent (HCC) --A1c 7.5. --BG have been within inpatient goal --d/c BG checks and SSI  History of DVT (deep vein thrombosis) he has not taken any of his oral medications for at least 1 week due to difficulty swallowing anything beyond fluid as it will not go down --cont heparin gtt for now  stage IVa oropharyngeal squamous cell carcinoma on on concurrent chemoradiation --outpatient onc followup   DVT prophylaxis: [id number] gtt Code Status: Full code  Family Communication:  Level of care: Progressive Dispo:   The patient is from: home Anticipated d/c is to: home Anticipated d/c date is: on or after 5/22 Patient currently is not medically ready to d/c due to: pending G-tube placement and cardio workup   Subjective and Interval History:  Pt reported feeling very weak.  Can only swallow small amount of liquid.   Objective: Vitals:   01/04/23 1400 01/04/23 1521 01/04/23 1728 01/04/23 1800  BP: 117/70 115/67 117/66 117/68  Pulse: 64 70 70 68  Resp:  17 16 18   Temp:  97.9 F (36.6 C) 98 F (36.7 C)   TempSrc:  Oral Oral   SpO2: 99% 100% 100% 100%  Weight:      Height:        Intake/Output Summary (Last 24 hours) at 01/04/2023 2018 Last data filed at 01/04/2023 1629 Gross per 24 hour  Intake 2105.5 ml  Output 350 ml  Net 1755.5 ml   Filed Weights   01/03/23 1030  Weight: 78 kg    Examination:   Constitutional: NAD, AAOx3 HEENT: conjunctivae and lids normal, EOMI CV: No cyanosis.   RESP: normal respiratory effort, on RA  Neuro: II - XII grossly intact.   Psych: Normal mood and affect.  Appropriate judgement and reason   Data Reviewed: I have personally reviewed labs and imaging studies  Time spent: 50 minutes  Darlin Priestly, MD [facility] If 7PM-7AM, please contact night-coverage 01/04/2023, 8:18 PM

## 2023-01-04 NOTE — Consult Note (Signed)
Chief Complaint: Oropharyngeal SCC. Malnutrition. Request is for gastrostomy tube placement  Referring Physician(s): Dr. Laurette Schimke  Supervising Physician: Irish Lack  Patient Status: [facility] - In-pt  History of Present Illness: Kenneth Gilmore is a 72 y.o. male inpatient. 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 Presented to the ED at [facility] on 5.13.24 after a syncopal episode. Found to be a flutter with concern for NSTEM, hypotensive with protein calorie malnutrition. Team is requesting a G tube for nutritional purposes. Case approved by IR Attending Dr. Sarita Haver.  Patient alert and sitting up in bed, calm. Denies any fevers, headache, chest pain, SOB, cough, abdominal pain, nausea, vomiting or bleeding. Return precautions and treatment recommendations and follow-up discussed with the patient  who is agreeable with the plan.    Past Medical History:  Diagnosis Date   Diabetes mellitus without complication (HCC)    Hyperlipidemia    Hypertension    Oropharyngeal cancer (HCC)     Past Surgical History:  Procedure Laterality Date   arm surgery Left    CORONARY ANGIOPLASTY WITH STENT PLACEMENT     left side rotator cuff surgery     SHOULDER SURGERY      Allergies: Patient has no known allergies.  Medications: Prior to Admission medications   Medication Sig Start Date End Date Taking? Authorizing Provider  oxyCODONE (OXY IR/ROXICODONE) 5 MG immediate release tablet Take 1 tablet (5 mg total) by mouth every 6 (six) hours as needed for severe pain. 12/09/22  Yes [name], MD  prochlorperazine (COMPAZINE) 10 MG tablet Take 1 tablet (10 mg total) by mouth every 6 (six) hours as needed (Nausea or vomiting). Patient not taking: Reported on 01/03/2023 11/16/22   [name], MD  sucralfate (CARAFATE) 1 GM/10ML suspension Take 10 mLs (1 g total) by mouth 4 (four) times daily -  with meals and at bedtime. Patient not taking: Reported on 01/03/2023 12/21/22   Borders,  Daryl Eastern, NP     Family History  Problem Relation Age of Onset   Cancer Father        lung    Social History   Socioeconomic History   Marital status: Single    Spouse name: Not on file   Number of children: Not on file   Years of education: Not on file   Highest education level: Not on file  Occupational History   Occupation: retired  Tobacco Use   Smoking status: Never   Smokeless tobacco: Current    Types: Chew  Substance and Sexual Activity   Alcohol use: No   Drug use: Never   Sexual activity: Not Currently  Other Topics Concern   Not on file  Social History Narrative   Not on file   Social Determinants of Health   Financial Resource Strain: Low Risk  (12/27/2022)   Overall Financial Resource Strain (CARDIA)    Difficulty of Paying Living Expenses: Not hard at all  Food Insecurity: No Food Insecurity (12/27/2022)   Hunger Vital Sign    Worried About Running Out of Food in the Last Year: Never true    Ran Out of Food in the Last Year: Never true  Transportation Needs: No Transportation Needs (12/27/2022)   PRAPARE - Administrator, Civil Service (Medical): No    Lack of Transportation (Non-Medical): No  Physical Activity: Not on file  Stress: Stress Concern Present (12/27/2022)   [employer] of Occupational Health - Occupational Stress Questionnaire  Feeling of Stress : To some extent  Social Connections: Moderately Integrated (12/27/2022)   Social Connection and Isolation Panel [NHANES]    Frequency of Communication with Friends and Family: More than three times a week    Frequency of Social Gatherings with Friends and Family: More than three times a week    Attends Religious Services: More than 4 times per year    Active Member of [employer] or Organizations: Yes    Attends Engineer, structural: More than 4 times per year    Marital Status: Never married     Review of Systems: A 12 point ROS discussed and pertinent positives are indicated  in the HPI above.  All other systems are negative.  Review of Systems  Constitutional:  Negative for fever.  HENT:  Negative for congestion.   Respiratory:  Negative for cough and shortness of breath.   Cardiovascular:  Negative for chest pain.  Gastrointestinal:  Negative for abdominal pain.  Neurological:  Negative for headaches.  Psychiatric/Behavioral:  Negative for behavioral problems and confusion.     Vital Signs: BP 117/70   Pulse 64   Temp 97.7 F (36.5 C) (Oral)   Resp 16   Ht 5\' 9"  (1.753 m)   Wt 171 lb 15.3 oz (78 kg)   SpO2 99%   BMI 25.39 kg/m    Physical Exam Vitals and nursing note reviewed.  Constitutional:      Appearance: He is well-developed. He is ill-appearing.  HENT:     Head: Normocephalic.     Mouth/Throat:     Mouth: Mucous membranes are dry.  Neck:     Comments: Mass noted Cardiovascular:     Rate and Rhythm: Normal rate and regular rhythm.     Heart sounds: Normal heart sounds.  Pulmonary:     Effort: Pulmonary effort is normal.     Breath sounds: Normal breath sounds.  Musculoskeletal:        General: Normal range of motion.     Cervical back: Normal range of motion.  Skin:    General: Skin is warm and dry.  Neurological:     General: No focal deficit present.     Mental Status: He is alert and oriented to person, place, and time.  Psychiatric:        Mood and Affect: Mood normal.        Behavior: Behavior normal.        Thought Content: Thought content normal.        Judgment: Judgment normal.     Imaging: ECHOCARDIOGRAM COMPLETE  Result Date: 01/04/2023    ECHOCARDIOGRAM REPORT   Patient Name:   Kenneth Gilmore Date of Exam: 01/03/2023 Medical Rec #:  [id number]       Height:       69.0 in Accession #:    [id number]      Weight:       172.0 lb Date of Birth:  03-29-1951        BSA:          1.938 m Patient Age:    72 years        BP:           105/55 mmHg Patient Gender: M               HR:           76 bpm. Exam Location:  [facility]  Procedure: 2D Echo, Cardiac Doppler, Color Doppler and Intracardiac  Opacification Agent Indications:     I48.92 Atrial Flutter  History:         Patient has no prior history of Echocardiogram examinations.                  Risk Factors:Hypertension, Dyslipidemia and Diabetes.  Sonographer:     Daphine Deutscher RDCS Referring Phys:  [id number] AMY N COX Diagnosing Phys: Yvonne Kendall MD IMPRESSIONS  1. There is a 3.3 x 1.7 cm mural thrombus near the left ventricular apex. Left ventricular ejection fraction, by estimation, is 35 to 40%. The left ventricle has moderately decreased function. The left ventricle demonstrates regional wall motion abnormalities (see scoring diagram/findings for description). Left ventricular diastolic parameters are consistent with Grade I diastolic dysfunction (impaired relaxation). There is akinesis of the left ventricular, mid-apical anterior wall and anteroseptal wall. There is akinesis of the left ventricular, apical inferior segment. There is akinesis of the left ventricular, apical segment.  2. Right ventricular systolic function is normal. The right ventricular size is mildly enlarged. There is normal pulmonary artery systolic pressure.  3. The mitral valve is abnormal. Trivial mitral valve regurgitation. No evidence of mitral stenosis.  4. The aortic valve has an indeterminant number of cusps. There is moderate thickening of the aortic valve. Aortic valve regurgitation is trivial. Aortic valve sclerosis/calcification is present, without any evidence of aortic stenosis.  5. The inferior vena cava is normal in size with greater than 50% respiratory variability, suggesting right atrial pressure of 3 mmHg. FINDINGS  Left Ventricle: There is a 3.3 x 1.7 cm mural thrombus near the left ventricular apex. Left ventricular ejection fraction, by estimation, is 35 to 40%. The left ventricle has moderately decreased function. The left ventricle demonstrates regional wall motion  abnormalities. Definity contrast agent was given IV to delineate the left ventricular endocardial borders. The left ventricular internal cavity size was normal in size. There is no left ventricular hypertrophy. Left ventricular diastolic parameters are consistent with Grade I diastolic dysfunction (impaired relaxation). Right Ventricle: The right ventricular size is mildly enlarged. No increase in right ventricular wall thickness. Right ventricular systolic function is normal. There is normal pulmonary artery systolic pressure. The tricuspid regurgitant velocity is 2.86  m/s, and with an assumed right atrial pressure of 3 mmHg, the estimated right ventricular systolic pressure is 35.7 mmHg. Left Atrium: Left atrial size was normal in size. Right Atrium: Right atrial size was normal in size. Pericardium: There is no evidence of pericardial effusion. Mitral Valve: The mitral valve is abnormal. There is mild thickening of the anterior mitral valve leaflet(s). Trivial mitral valve regurgitation. No evidence of mitral valve stenosis. Tricuspid Valve: The tricuspid valve is normal in structure. Tricuspid valve regurgitation is mild. Aortic Valve: The aortic valve has an indeterminant number of cusps. There is moderate thickening of the aortic valve. Aortic valve regurgitation is trivial. Aortic valve sclerosis/calcification is present, without any evidence of aortic stenosis. Aortic  valve mean gradient measures 6.5 mmHg. Aortic valve peak gradient measures 12.9 mmHg. Aortic valve area, by VTI measures 1.52 cm. Pulmonic Valve: The pulmonic valve was not well visualized. Pulmonic valve regurgitation is trivial. No evidence of pulmonic stenosis. Aorta: The aortic root and ascending aorta are structurally normal, with no evidence of dilitation. Pulmonary Artery: The pulmonary artery is of normal size. Venous: The inferior vena cava is normal in size with greater than 50% respiratory variability, suggesting right atrial  pressure of 3 mmHg. IAS/Shunts: The interatrial septum was not well visualized.  LEFT VENTRICLE PLAX 2D LVIDd:         4.80 cm   Diastology LVIDs:         3.70 cm   LV e' medial:    6.64 cm/s LV PW:         1.00 cm   LV E/e' medial:  11.9 LV IVS:        1.00 cm   LV e' lateral:   7.56 cm/s LVOT diam:     2.00 cm   LV E/e' lateral: 10.5 LV SV:         54 LV SV Index:   28 LVOT Area:     3.14 cm  RIGHT VENTRICLE             IVC RV Basal diam:  4.30 cm     IVC diam: 1.20 cm RV S prime:     18.20 cm/s TAPSE (M-mode): 2.8 cm LEFT ATRIUM             Index        RIGHT ATRIUM           Index LA diam:        4.50 cm 2.32 cm/m   RA Area:     10.70 cm LA Vol (A2C):   40.1 ml 20.70 ml/m  RA Volume:   24.10 ml  12.44 ml/m LA Vol (A4C):   53.3 ml 27.51 ml/m LA Biplane Vol: 46.0 ml 23.74 ml/m  AORTIC VALVE AV Area (Vmax):    1.55 cm AV Area (Vmean):   1.46 cm AV Area (VTI):     1.52 cm AV Vmax:           179.50 cm/s AV Vmean:          120.500 cm/s AV VTI:            0.359 m AV Peak Grad:      12.9 mmHg AV Mean Grad:      6.5 mmHg LVOT Vmax:         88.77 cm/s LVOT Vmean:        56.100 cm/s LVOT VTI:          0.173 m LVOT/AV VTI ratio: 0.48  AORTA Ao Root diam: 3.40 cm Ao Asc diam:  3.40 cm MITRAL VALVE                TRICUSPID VALVE MV Area (PHT): 3.42 cm     TR Peak grad:   32.7 mmHg MV Decel Time: 222 msec     TR Vmax:        286.00 cm/s MV E velocity: 79.20 cm/s MV A velocity: 111.00 cm/s  SHUNTS MV E/A ratio:  0.71         Systemic VTI:  0.17 m                             Systemic Diam: 2.00 cm Yvonne Kendall MD Electronically signed by Yvonne Kendall MD Signature Date/Time: 01/04/2023/10:24:53 AM    Final    DG Chest Port 1 View  Result Date: 01/03/2023 CLINICAL DATA:  Syncope EXAM: PORTABLE CHEST 1 VIEW COMPARISON:  X-ray 12/30/2022 FINDINGS: Hyperinflation. Normal cardiopericardial silhouette when adjusting for technique. No consolidation, pneumothorax, effusion or edema. Overlapping cardiac leads.  Degenerative changes of the spine. Fixation pin along the distal left clavicle. IMPRESSION: Hyperinflation.  No acute cardiopulmonary disease. Electronically Signed   By: Karen Kays  M.D.   On: 01/03/2023 15:10   CT Head Wo Contrast  Result Date: 01/03/2023 CLINICAL DATA:  Trauma.  History of head neck cancer. EXAM: CT HEAD WITHOUT CONTRAST CT CERVICAL SPINE WITHOUT CONTRAST TECHNIQUE: Multidetector CT imaging of the head and cervical spine was performed following the standard protocol without intravenous contrast. Multiplanar CT image reconstructions of the cervical spine were also generated. RADIATION DOSE REDUCTION: This exam was performed according to the departmental dose-optimization program which includes automated exposure control, adjustment of the mA and/or kV according to patient size and/or use of iterative reconstruction technique. COMPARISON:  MR brain 03/29/2020 FINDINGS: CT HEAD FINDINGS Brain: No evidence of acute infarction, hemorrhage, hydrocephalus, extra-axial collection or mass lesion/mass effect. There is heterogeneous low-attenuation within the subcortical and periventricular white matter compatible with chronic microvascular disease. Vascular: No hyperdense vessel or unexpected calcification. Skull: Normal. Negative for fracture or focal lesion. Sinuses/Orbits: No acute finding. Other: None. CT CERVICAL SPINE FINDINGS Alignment: The alignment of the cervical spine is normal. Skull base and vertebrae: No signs of acute fracture or subluxation. Soft tissues and spinal canal: No prevertebral fluid or swelling. No visible canal hematoma. Disc levels: There are large, bulky, flowing ventral syndesmophytes throughout the cervical spine. Disc space narrowing and calcification identified at C3-4. There is also disc space narrowing at C5-6 and C7-T1. Mild bilateral facet arthropathy. Upper chest: Right base of tongue/oropharyngeal mass is again identified. This is only partially visualized on  the current study, image 39/4. Other: None IMPRESSION: 1. No evidence for acute intracranial abnormality. 2. Chronic microvascular disease. 3. No evidence for acute cervical spine fracture or subluxation. 4. 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 5. 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 This is only partially visualized on the current study. See PET-CT report from 11/15/2022 for details. Electronically Signed   By: Signa Kell M.D.   On: 01/03/2023 13:37   CT Cervical Spine Wo Contrast  Result Date: 01/03/2023 CLINICAL DATA:  Trauma.  History of head neck cancer. EXAM: CT HEAD WITHOUT CONTRAST CT CERVICAL SPINE WITHOUT CONTRAST TECHNIQUE: Multidetector CT imaging of the head and cervical spine was performed following the standard protocol without intravenous contrast. Multiplanar CT image reconstructions of the cervical spine were also generated. RADIATION DOSE REDUCTION: This exam was performed according to the departmental dose-optimization program which includes automated exposure control, adjustment of the mA and/or kV according to patient size and/or use of iterative reconstruction technique. COMPARISON:  MR brain 03/29/2020 FINDINGS: CT HEAD FINDINGS Brain: No evidence of acute infarction, hemorrhage, hydrocephalus, extra-axial collection or mass lesion/mass effect. There is heterogeneous low-attenuation within the subcortical and periventricular white matter compatible with chronic microvascular disease. Vascular: No hyperdense vessel or unexpected calcification. Skull: Normal. Negative for fracture or focal lesion. Sinuses/Orbits: No acute finding. Other: None. CT CERVICAL SPINE FINDINGS Alignment: The alignment of the cervical spine is normal. Skull base and vertebrae: No signs of acute fracture or subluxation. Soft tissues and spinal canal: No prevertebral fluid or swelling. No visible canal hematoma. Disc levels: There are large, bulky, flowing ventral  syndesmophytes throughout the cervical spine. Disc space narrowing and calcification identified at C3-4. There is also disc space narrowing at C5-6 and C7-T1. Mild bilateral facet arthropathy. Upper chest: Right base of tongue/oropharyngeal mass is again identified. This is only partially visualized on the current study, image 39/4. Other: None IMPRESSION: 1. No evidence for acute intracranial abnormality. 2. Chronic microvascular disease. 3. No evidence for acute cervical spine fracture or subluxation.  4. 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 5. 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 This is only partially visualized on the current study. See PET-CT report from 11/15/2022 for details. Electronically Signed   By: Signa Kell M.D.   On: 01/03/2023 13:37   DG Chest 2 View  Result Date: 12/30/2022 CLINICAL DATA:  Provided history: Aspiration, weakness. EXAM: CHEST - 2 VIEW COMPARISON:  PET CT 11/15/2022. FINDINGS: Heart size within normal limits. No appreciable airspace consolidation. No evidence of pleural effusion or pneumothorax. No acute osseous abnormality identified. No acute osseous abnormality identified. Dextrocurvature of the thoracic spine. Metallic screw within the distal left clavicle. IMPRESSION: No evidence of active cardiopulmonary disease. Electronically Signed   By: Jackey Loge D.O.   On: 12/30/2022 10:04   DG SWALLOW FUNC OP MEDICARE SPEECH PATH  Result Date: 12/22/2022 Table formatting from the original result was not included. 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 Soft tissue neck - 09/30/2022  Infiltrating mass in the right tongue measuring   up to 4 cm.  Prominent submucosal involvement including in the right   parapharyngeal fat. Assessment of relationship to the pterygoids and   extrinsic tongue musculature is somewhat hindered by streak artifact   from dental amalgam.      US biopsy - 10/22/2022  Lymph node, right cervical:  Positive for malignancy; Metastatic Squamous Cell Carcinoma, Keratinizing, in a background of dense fibrosis and tumor necrosis  An immunohistochemical study directed against p16 is strongly and   diffusely positive, indicating an HPV driven malignancy. Clinical Impression: Pt presents severe oropharyngeal dysphagia when consuming thin liquids via cup, nectar thick liquids via cup and puree.      His dysphagia is multifactorial in nature:  severe trismus - trials limited in study d/t inability to open mouth to receive spoon, graham cracker or barium tablet  increased pharyngeal weakness (BOT/hyolaryngeal weakness resulting in decreased epiglottic ) from chemoradiation for oropharyngeal cancer  tumor burden (see CT Soft Tissue Neck)  baseline structural dysphagia d/t the appearance of large osteophytes at C4-C5     As a result, pt presents with silent aspiration of thin liquids and nectar thick liquids and decreased intake of puree with ineffiecent pharyngeal clearing of puree resulting in multiple swallows. At this time, recommend pt be NPO with water protocol following oral care, intensive trismus and pharyngeal treatment with serious consideration for alternative means of nutrition to prevent further dehydration and malnutrition while undergoing cancer treatments. Factors that may increase risk of adverse event in presence of aspiration Rubye Oaks & Clearance Coots 2021): Factors that may increase risk of adverse event in presence of aspiration Rubye Oaks & Clearance Coots 2021): Frail or deconditioned; Inadequate oral hygiene; Reduced saliva; Weak cough (orophrayngeal mass, radiation treatment for oropharyngeal mass, weakness/deconditioning d/t weight loss  associated with severe trismus; severe trismus) Recommendations/Plan: Swallowing Evaluation Recommendations Swallowing Evaluation Recommendations Recommendations: NPO; Free water protocol after oral care; Alternative means of nutrition - G Tube Medication Administration: Via alternative means Supervision: Patient able to self-feed Oral care recommendations: Oral care QID (4x/day) Recommended consults: Consider GI consultation (for G-Tube placement) Treatment Plan Treatment Plan Treatment recommendations: Other (comment) (continue Outpatient ST services) Follow-up recommendations: Outpatient SLP Functional status assessment: Patient has had a recent  decline in their functional status and demonstrates the ability to make significant improvements in function in a reasonable and predictable amount of time. Recommendations Recommendations for follow up therapy are one component of a multi-disciplinary discharge planning process, led by the attending physician.  Recommendations may be updated based on patient status, additional functional criteria and insurance authorization. Assessment: Orofacial Exam: Orofacial Exam Oral Cavity: Oral Hygiene: WFL Oral Cavity - Dentition: Adequate natural dentition Orofacial Anatomy: WFL Anatomy: Anatomy: -- (Large appearing osteophytes at C4-C5) Boluses Administered: Boluses Administered Boluses Administered: Thin liquids (Level 0); Mildly thick liquids (Level 2, nectar thick); Puree  Oral Impairment Domain: Oral Impairment Domain Lip Closure: No labial escape Tongue control during bolus hold: Cohesive bolus between tongue to palatal seal Bolus preparation/mastication: Disorganized chewing/mashing with solid pieces of bolus unchewed (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) Bolus transport/lingual motion: Brisk tongue motion; Repetitive/disorganized tongue motion Oral residue: Trace residue lining oral structures Location of oral residue : Tongue Initiation of pharyngeal  swallow : Valleculae; Pyriform sinuses  Pharyngeal Impairment Domain: Pharyngeal Impairment Domain Soft palate elevation: No bolus between soft palate (SP)/pharyngeal wall (PW) Laryngeal elevation: Partial superior movement of thyroid cartilage/partial approximation of arytenoids to epiglottic petiole Anterior hyoid excursion: Partial anterior movement Epiglottic movement: Partial inversion Laryngeal vestibule closure: Incomplete, narrow column air/contrast in laryngeal vestibule Pharyngeal stripping wave : Present - diminished Pharyngoesophageal segment opening: Partial distention/partial duration, partial obstruction of flow (suspect that large osetophytes play role in clearance thru PE segment) Tongue base retraction: Trace column of contrast or air between tongue base and PPW Pharyngeal residue: Collection of residue within or on pharyngeal structures; Majority of contrast within or on pharyngeal structures Location of pharyngeal residue: Valleculae; Pharyngeal wall; Pyriform sinuses; Diffuse (>3 areas)  Esophageal Impairment Domain: No data recorded Pill: No data recorded Penetration/Aspiration Scale Score: Penetration/Aspiration Scale Score 3.  Material enters airway, remains ABOVE vocal cords and not ejected out: Puree 8.  Material enters airway, passes BELOW cords without attempt by patient to eject out (silent aspiration) : Thin liquids (Level 0); Mildly thick liquids (Level 2, nectar thick) Compensatory Strategies: No data recorded  General Information: Caregiver present: No  Diet Prior to this Study: Dysphagia 1 (pureed); Thin liquids (Level 0)   Temperature : Normal   Respiratory Status: WFL   Supplemental O2: None (Room air)   History of Recent Intubation: No  Behavior/Cognition: Alert; Cooperative; Pleasant mood (emotional d/t recent staging of cancer) Self-Feeding Abilities: Able to self-feed Baseline vocal quality/speech: Abnormal resonance (Wet; low vocal intensity) Volitional Cough: Able to elicit  Volitional Swallow: Unable to elicit (attempted but unable to complete swallow) Exam Limitations: Other (comment) (bolus adminstration and bolus consistencies limited by severe trismus) Goal Planning: Prognosis for improved oropharyngeal function: Fair Barriers to Reach Goals: Overall medical prognosis; Severity of deficits; Medication (chemo, radiation) No data recorded No data recorded Consulted and agree with results and recommendations: Patient Pain: Pain Assessment Pain Assessment: 0-10 Pain Score: 8 Pain Location: jaw, tongue Pain Descriptors / Indicators: Aching; Constant; Contraction Pain Intervention(s): Limited activity within patient's tolerance; Monitored during session End of Session: Start Time:SLP Start Time (ACUTE ONLY): 1230 Stop Time: SLP Stop Time (ACUTE ONLY): 1300 Time Calculation:SLP Time Calculation (min) (ACUTE ONLY): 30 min Charges: SLP Evaluations $ SLP Speech Visit: 1 Visit SLP Evaluations $MBS Swallow: 1 Procedure SLP visit diagnosis: SLP Visit Diagnosis: Dysphagia, oropharyngeal phase (R13.12) Past Medical History: Past Medical History: Diagnosis Date  Diabetes mellitus without complication (HCC)   Hyperlipidemia  Hypertension  Past Surgical History: Past Surgical History: Procedure Laterality Date  arm surgery Left   CORONARY ANGIOPLASTY WITH STENT PLACEMENT    left side rotator cuff surgery    SHOULDER SURGERY   Happi Overton 12/22/2022, 3:55 PM CLINICAL DATA:  Patient with a history of oropharyngeal squamous cell carcinoma. Patient presents today with complaints of dysphagia, throat pain. EXAM: MODIFIED BARIUM SWALLOW TECHNIQUE: Different consistencies of barium were administered orally to the patient by the Speech Pathologist. Imaging of the pharynx was performed in the lateral projection. Alwyn Ren, NP was present in the fluoroscopy room during this study, which was supervised and interpreted by Dr. Grace Isaac. FLUOROSCOPY: Radiation Exposure Index (as provided by the fluoroscopic  device): 5.20 mGy Kerma COMPARISON:  Neck CT-09/28/2022 FINDINGS: Vestibular  Penetration:  Observed with all consistencies of barium. Aspiration:  Observed with thin and nectar thick barium. Other: Note is made of prominent anteriorly directed syndesmophytes within mid and inferior aspects of the cervical spine as demonstrated on recent neck CT IMPRESSION: Vestibular penetration with all consistencies of barium. Aspiration observed with thin and nectar thick barium. Read by: Alwyn Ren, NP Please refer to the Speech Pathologists report for complete details and recommendations. Electronically Signed   By: Simonne Come M.D.   On: 12/21/2022 13:38   Labs:  CBC: Recent Labs    12/31/22 0915 01/03/23 0849 01/03/23 1033 01/04/23 0437  WBC 9.2 7.2 7.5 5.6  HGB 14.2 14.1 14.9 12.0*  HCT 41.2 40.3 42.6 34.5*  PLT 158 111* 129* 96*    COAGS: Recent Labs    01/03/23 1514  INR 1.3*  APTT 24    BMP: Recent Labs    12/31/22 0915 01/03/23 0850 01/03/23 1033 01/04/23 0437  NA 132* 137 137 137  K 3.8 3.7 4.3 3.2*  CL 96* 98 98 104  CO2 18* 22 21* 22  GLUCOSE 159* 172* 179* 114*  BUN 24* 21 22 22   CALCIUM 8.8* 8.9 9.2 7.9*  CREATININE 1.00 0.87 0.92 0.64  GFRNONAA >60 >60 >60 >60    LIVER FUNCTION TESTS: Recent Labs    12/23/22 0801 12/30/22 0812 12/31/22 0915 01/03/23 0850  BILITOT 0.7 1.8* 1.5* 1.4*  AST 23 62* 38 24  ALT 20 31 26 19   ALKPHOS 68 76 65 68  PROT 7.7 7.6 6.8 6.7  ALBUMIN 4.2 3.9 3.5 3.4*     Assessment and Plan:  72 y.o. male. Inpatient. 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 Presented to the ED at [facility] on 5.13.24 after a syncopal episode. Found to be a flutter with concern for NSTEM, hypotensive with protein calorie malnutrition. Team is requesting a G tube for nutritional purposes. Case approved by IR Attending Dr. Sarita Haver.  PET scan from 3.26.24 shows the stomach in an accessible position for percutaneous access. Patient  reports the last dose of xarelto was last week. 81 mg of ASA given 5.14.24. Patient is on a heparin gtt.   Risks and benefits image guided gastrostomy tube placement was discussed with the patient including, but not limited to the need for a barium enema during the procedure, bleeding, infection, peritonitis and/or damage to adjacent structures.  All of the patient's questions were answered, patient is agreeable to proceed.  Consent signed and in IR control room     Thank you for this interesting consult.  I greatly enjoyed meeting Kenneth Gilmore and look forward to participating in their care.  A copy of this report was  sent to the requesting provider on this date.  Electronically Signed: Alene Mires, NP 01/04/2023, 3:20 PM   I spent a total of 40 Minutes    in face to face in clinical consultation, greater than 50% of which was counseling/coordinating care for G tube placement

## 2023-01-04 NOTE — Consult Note (Signed)
Palliative Medicine [facility] at [facility] Telephone:([phone]) [phone] [phone]   Name: Kenneth Gilmore Date: 01/04/2023 MRN: [id number]  DOB: [birth year]  Patient Care Team: [facility] as PCP - General    REASON FOR CONSULTATION: Kenneth Gilmore is a 72 y.o. male with multiple medical problems including stage IVa oropharyngeal squamous cell carcinoma, history of DVT on Xarelto. Patient is on concurrent chemoradiation.  Patient was admitted to the hospital on 01/03/2023 with a flutter and possible non-STEMI after presenting to the clinic for evaluation of a syncopal event.  Palliative care was consulted to address goals.  SOCIAL HISTORY:     reports that he has never smoked. His smokeless tobacco use includes chew. He reports that he does not drink alcohol and does not use drugs.  Patient is unmarried and has no children.  He lives at home alone.  He has a sister who is involved.  ADVANCE DIRECTIVES:  Not on file  CODE STATUS: Full code  PAST MEDICAL HISTORY: Past Medical History:  Diagnosis Date   Diabetes mellitus without complication (HCC)    Hyperlipidemia    Hypertension    Oropharyngeal cancer (HCC)     PAST SURGICAL HISTORY:  Past Surgical History:  Procedure Laterality Date   arm surgery Left    CORONARY ANGIOPLASTY WITH STENT PLACEMENT     left side rotator cuff surgery     SHOULDER SURGERY      HEMATOLOGY/ONCOLOGY HISTORY:  Oncology History  Oropharyngeal cancer (HCC)  09/28/2022 Imaging   CT soft tissue neck with contrast showed infiltrating mass in the right tongue with multiple ipsilateral nodal metastases. The primary mass measures at least 4 cm and infiltrates parapharyngeal fat, visualization of tumor extent is limited by artifact from dental amalgam.   10/28/2022 Initial Diagnosis   Squamous cell carcinoma of tongue (HCC) Patient has throat pain and was evaluated by ENT Dr.Bennett.  Physical examination  showed cervical lymphadenopathy.  10/15/2022 ultrasound-guided biopsy of right cervical lymph node showed fibrous tissue, detach minute salivary gland tissue.  Granular debris's and a few dysplastic squamous epithelial cells, nondiagnostic.  10/22/2022 repeat ultrasound-guided biopsy of right cervical lymph node was positive for malignancy, metastatic squamous cell carcinoma, keratinizing, in a background of dense fibrosis and tumor necrosis.   10/28/2022 Cancer Staging   Staging form: Oral Cavity, AJCC 8th Edition - Clinical: Stage IVA (cT3, cN2a, cM0) - Signed by [name], MD on 11/02/2022   11/16/2022 Imaging   PET scan showed 1. Large infiltrating right oropharyngeal mass is markedly hypermetabolic and consistent with known squamous cell carcinoma. 2. Bilateral hypermetabolic metastatic cervical lymphadenopathy. 3. No evidence of metastatic disease involving the chest, abdomen/pelvis or bony structures.     12/09/2022 -  Chemotherapy   Patient is on Treatment Plan : HEAD/NECK Cisplatin (40) q7d       ALLERGIES:  has No Known Allergies.  MEDICATIONS:  Current Facility-Administered Medications  Medication Dose Route Frequency Provider Last Rate Last Admin   0.9 %  sodium chloride infusion   Intravenous Continuous Darlin Priestly, MD 50 mL/hr at 01/04/23 1342 New Bag at 01/04/23 1342   acetaminophen (OFIRMEV) IV 1,000 mg  1,000 mg Intravenous Q6H PRN Cox, Amy N, DO       aspirin EC tablet 81 mg  81 mg Oral Daily End, Christopher, MD   81 mg at 01/04/23 0910   [START ON 01/10/2023] ceFAZolin (ANCEF) IVPB 2g/100 mL premix  2 g Intravenous Once  Alene Mires, NP       diltiazem (CARDIZEM) 125 mg in dextrose 5% 125 mL (1 mg/mL) infusion  5-15 mg/hr Intravenous Titrated Cox, Amy N, DO 5 mL/hr at 01/04/23 1523 5 mg/hr at 01/04/23 1523   heparin ADULT infusion 100 units/mL (25000 units/258mL)  1,200 Units/hr Intravenous Continuous Jaynie Bream, RPH 12 mL/hr at 01/04/23 1155 1,200 Units/hr  at 01/04/23 1155   insulin aspart (novoLOG) injection 0-5 Units  0-5 Units Subcutaneous QHS Cox, Amy N, DO   1 Units at 01/03/23 2053   insulin aspart (novoLOG) injection 0-9 Units  0-9 Units Subcutaneous TID WC Cox, Amy N, DO   1 Units at 01/04/23 1151   morphine (PF) 2 MG/ML injection 2 mg  2 mg Intravenous Q4H PRN Cox, Amy N, DO       ondansetron (ZOFRAN) tablet 4 mg  4 mg Oral Q6H PRN Cox, Amy N, DO       Or   ondansetron (ZOFRAN) injection 4 mg  4 mg Intravenous Q6H PRN Cox, Amy N, DO       senna-docusate (Senokot-S) tablet 1 tablet  1 tablet Oral QHS PRN Cox, Amy N, DO       Current Outpatient Medications  Medication Sig Dispense Refill   oxyCODONE (OXY IR/ROXICODONE) 5 MG immediate release tablet Take 1 tablet (5 mg total) by mouth every 6 (six) hours as needed for severe pain. 30 tablet 0   prochlorperazine (COMPAZINE) 10 MG tablet Take 1 tablet (10 mg total) by mouth every 6 (six) hours as needed (Nausea or vomiting). (Patient not taking: Reported on 01/03/2023) 30 tablet 1   sucralfate (CARAFATE) 1 GM/10ML suspension Take 10 mLs (1 g total) by mouth 4 (four) times daily -  with meals and at bedtime. (Patient not taking: Reported on 01/03/2023) 420 mL 0    VITAL SIGNS: BP 115/67 (BP Location: Left Arm)   Pulse 70   Temp 97.9 F (36.6 C) (Oral)   Resp 17   Ht 5\' 9"  (1.753 m)   Wt 171 lb 15.3 oz (78 kg)   SpO2 100%   BMI 25.39 kg/m  Filed Weights   01/03/23 1030  Weight: 171 lb 15.3 oz (78 kg)    Estimated body mass index is 25.39 kg/m as calculated from the following:   Height as of this encounter: 5\' 9"  (1.753 m).   Weight as of this encounter: 171 lb 15.3 oz (78 kg).  LABS: CBC:    Component Value Date/Time   WBC 5.6 01/04/2023 0437   HGB 12.0 (L) 01/04/2023 0437   HGB 14.1 01/03/2023 0849   HCT 34.5 (L) 01/04/2023 0437   PLT 96 (L) 01/04/2023 0437   PLT 111 (L) 01/03/2023 0849   MCV 92.5 01/04/2023 0437   NEUTROABS 5.8 01/03/2023 0849   LYMPHSABS 0.6 (L)  01/03/2023 0849   MONOABS 0.8 01/03/2023 0849   EOSABS 0.0 01/03/2023 0849   BASOSABS 0.0 01/03/2023 0849   Comprehensive Metabolic Panel:    Component Value Date/Time   NA 137 01/04/2023 0437   K 3.2 (L) 01/04/2023 0437   CL 104 01/04/2023 0437   CO2 22 01/04/2023 0437   BUN 22 01/04/2023 0437   CREATININE 0.64 01/04/2023 0437   CREATININE 0.87 01/03/2023 0850   GLUCOSE 114 (H) 01/04/2023 0437   CALCIUM 7.9 (L) 01/04/2023 0437   AST 24 01/03/2023 0850   ALT 19 01/03/2023 0850   ALKPHOS 68 01/03/2023 0850   BILITOT 1.4 (H)  01/03/2023 0850   PROT 6.7 01/03/2023 0850   ALBUMIN 3.4 (L) 01/03/2023 0850    RADIOGRAPHIC STUDIES: ECHOCARDIOGRAM COMPLETE  Result Date: 01/04/2023    ECHOCARDIOGRAM REPORT   Patient Name:   EESA TYSKA Date of Exam: 01/03/2023 Medical Rec #:  [id number]       Height:       69.0 in Accession #:    [id number]      Weight:       172.0 lb Date of Birth:  July 01, 1951        BSA:          1.938 m Patient Age:    72 years        BP:           105/55 mmHg Patient Gender: M               HR:           76 bpm. Exam Location:  [facility] Procedure: 2D Echo, Cardiac Doppler, Color Doppler and Intracardiac            Opacification Agent Indications:     I48.92 Atrial Flutter  History:         Patient has no prior history of Echocardiogram examinations.                  Risk Factors:Hypertension, Dyslipidemia and Diabetes.  Sonographer:     Daphine Deutscher RDCS Referring Phys:  [id number] AMY N COX Diagnosing Phys: Yvonne Kendall MD IMPRESSIONS  1. There is a 3.3 x 1.7 cm mural thrombus near the left ventricular apex. Left ventricular ejection fraction, by estimation, is 35 to 40%. The left ventricle has moderately decreased function. The left ventricle demonstrates regional wall motion abnormalities (see scoring diagram/findings for description). Left ventricular diastolic parameters are consistent with Grade I diastolic dysfunction (impaired relaxation). There is akinesis of the  left ventricular, mid-apical anterior wall and anteroseptal wall. There is akinesis of the left ventricular, apical inferior segment. There is akinesis of the left ventricular, apical segment.  2. Right ventricular systolic function is normal. The right ventricular size is mildly enlarged. There is normal pulmonary artery systolic pressure.  3. The mitral valve is abnormal. Trivial mitral valve regurgitation. No evidence of mitral stenosis.  4. The aortic valve has an indeterminant number of cusps. There is moderate thickening of the aortic valve. Aortic valve regurgitation is trivial. Aortic valve sclerosis/calcification is present, without any evidence of aortic stenosis.  5. The inferior vena cava is normal in size with greater than 50% respiratory variability, suggesting right atrial pressure of 3 mmHg. FINDINGS  Left Ventricle: There is a 3.3 x 1.7 cm mural thrombus near the left ventricular apex. Left ventricular ejection fraction, by estimation, is 35 to 40%. The left ventricle has moderately decreased function. The left ventricle demonstrates regional wall motion abnormalities. Definity contrast agent was given IV to delineate the left ventricular endocardial Winna Golla. The left ventricular internal cavity size was normal in size. There is no left ventricular hypertrophy. Left ventricular diastolic parameters are consistent with Grade I diastolic dysfunction (impaired relaxation). Right Ventricle: The right ventricular size is mildly enlarged. No increase in right ventricular wall thickness. Right ventricular systolic function is normal. There is normal pulmonary artery systolic pressure. The tricuspid regurgitant velocity is 2.86  m/s, and with an assumed right atrial pressure of 3 mmHg, the estimated right ventricular systolic pressure is 35.7 mmHg. Left Atrium: Left atrial size was normal in size. Right  Atrium: Right atrial size was normal in size. Pericardium: There is no evidence of pericardial effusion.  Mitral Valve: The mitral valve is abnormal. There is mild thickening of the anterior mitral valve leaflet(s). Trivial mitral valve regurgitation. No evidence of mitral valve stenosis. Tricuspid Valve: The tricuspid valve is normal in structure. Tricuspid valve regurgitation is mild. Aortic Valve: The aortic valve has an indeterminant number of cusps. There is moderate thickening of the aortic valve. Aortic valve regurgitation is trivial. Aortic valve sclerosis/calcification is present, without any evidence of aortic stenosis. Aortic  valve mean gradient measures 6.5 mmHg. Aortic valve peak gradient measures 12.9 mmHg. Aortic valve area, by VTI measures 1.52 cm. Pulmonic Valve: The pulmonic valve was not well visualized. Pulmonic valve regurgitation is trivial. No evidence of pulmonic stenosis. Aorta: The aortic root and ascending aorta are structurally normal, with no evidence of dilitation. Pulmonary Artery: The pulmonary artery is of normal size. Venous: The inferior vena cava is normal in size with greater than 50% respiratory variability, suggesting right atrial pressure of 3 mmHg. IAS/Shunts: The interatrial septum was not well visualized.  LEFT VENTRICLE PLAX 2D LVIDd:         4.80 cm   Diastology LVIDs:         3.70 cm   LV e' medial:    6.64 cm/s LV PW:         1.00 cm   LV E/e' medial:  11.9 LV IVS:        1.00 cm   LV e' lateral:   7.56 cm/s LVOT diam:     2.00 cm   LV E/e' lateral: 10.5 LV SV:         54 LV SV Index:   28 LVOT Area:     3.14 cm  RIGHT VENTRICLE             IVC RV Basal diam:  4.30 cm     IVC diam: 1.20 cm RV S prime:     18.20 cm/s TAPSE (M-mode): 2.8 cm LEFT ATRIUM             Index        RIGHT ATRIUM           Index LA diam:        4.50 cm 2.32 cm/m   RA Area:     10.70 cm LA Vol (A2C):   40.1 ml 20.70 ml/m  RA Volume:   24.10 ml  12.44 ml/m LA Vol (A4C):   53.3 ml 27.51 ml/m LA Biplane Vol: 46.0 ml 23.74 ml/m  AORTIC VALVE AV Area (Vmax):    1.55 cm AV Area (Vmean):   1.46  cm AV Area (VTI):     1.52 cm AV Vmax:           179.50 cm/s AV Vmean:          120.500 cm/s AV VTI:            0.359 m AV Peak Grad:      12.9 mmHg AV Mean Grad:      6.5 mmHg LVOT Vmax:         88.77 cm/s LVOT Vmean:        56.100 cm/s LVOT VTI:          0.173 m LVOT/AV VTI ratio: 0.48  AORTA Ao Root diam: 3.40 cm Ao Asc diam:  3.40 cm MITRAL VALVE  TRICUSPID VALVE MV Area (PHT): 3.42 cm     TR Peak grad:   32.7 mmHg MV Decel Time: 222 msec     TR Vmax:        286.00 cm/s MV E velocity: 79.20 cm/s MV A velocity: 111.00 cm/s  SHUNTS MV E/A ratio:  0.71         Systemic VTI:  0.17 m                             Systemic Diam: 2.00 cm Yvonne Kendall MD Electronically signed by Yvonne Kendall MD Signature Date/Time: 01/04/2023/10:24:53 AM    Final    DG Chest Port 1 View  Result Date: 01/03/2023 CLINICAL DATA:  Syncope EXAM: PORTABLE CHEST 1 VIEW COMPARISON:  X-ray 12/30/2022 FINDINGS: Hyperinflation. Normal cardiopericardial silhouette when adjusting for technique. No consolidation, pneumothorax, effusion or edema. Overlapping cardiac leads. Degenerative changes of the spine. Fixation pin along the distal left clavicle. IMPRESSION: Hyperinflation.  No acute cardiopulmonary disease. Electronically Signed   By: Karen Kays M.D.   On: 01/03/2023 15:10   CT Head Wo Contrast  Result Date: 01/03/2023 CLINICAL DATA:  Trauma.  History of head neck cancer. EXAM: CT HEAD WITHOUT CONTRAST CT CERVICAL SPINE WITHOUT CONTRAST TECHNIQUE: Multidetector CT imaging of the head and cervical spine was performed following the standard protocol without intravenous contrast. Multiplanar CT image reconstructions of the cervical spine were also generated. RADIATION DOSE REDUCTION: This exam was performed according to the departmental dose-optimization program which includes automated exposure control, adjustment of the mA and/or kV according to patient size and/or use of iterative reconstruction technique.  COMPARISON:  MR brain 03/29/2020 FINDINGS: CT HEAD FINDINGS Brain: No evidence of acute infarction, hemorrhage, hydrocephalus, extra-axial collection or mass lesion/mass effect. There is heterogeneous low-attenuation within the subcortical and periventricular white matter compatible with chronic microvascular disease. Vascular: No hyperdense vessel or unexpected calcification. Skull: Normal. Negative for fracture or focal lesion. Sinuses/Orbits: No acute finding. Other: None. CT CERVICAL SPINE FINDINGS Alignment: The alignment of the cervical spine is normal. Skull base and vertebrae: No signs of acute fracture or subluxation. Soft tissues and spinal canal: No prevertebral fluid or swelling. No visible canal hematoma. Disc levels: There are large, bulky, flowing ventral syndesmophytes throughout the cervical spine. Disc space narrowing and calcification identified at C3-4. There is also disc space narrowing at C5-6 and C7-T1. Mild bilateral facet arthropathy. Upper chest: Right base of tongue/oropharyngeal mass is again identified. This is only partially visualized on the current study, image 39/4. Other: None IMPRESSION: 1. No evidence for acute intracranial abnormality. 2. Chronic microvascular disease. 3. No evidence for acute cervical spine fracture or subluxation. 4. 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 5. 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 This is only partially visualized on the current study. See PET-CT report from 11/15/2022 for details. Electronically Signed   By: Signa Kell M.D.   On: 01/03/2023 13:37   CT Cervical Spine Wo Contrast  Result Date: 01/03/2023 CLINICAL DATA:  Trauma.  History of head neck cancer. EXAM: CT HEAD WITHOUT CONTRAST CT CERVICAL SPINE WITHOUT CONTRAST TECHNIQUE: Multidetector CT imaging of the head and cervical spine was performed following the standard protocol without intravenous contrast. Multiplanar CT image reconstructions  of the cervical spine were also generated. RADIATION DOSE REDUCTION: This exam was performed according to the departmental dose-optimization program which includes automated exposure control, adjustment of the mA and/or kV according  to patient size and/or use of iterative reconstruction technique. COMPARISON:  MR brain 03/29/2020 FINDINGS: CT HEAD FINDINGS Brain: No evidence of acute infarction, hemorrhage, hydrocephalus, extra-axial collection or mass lesion/mass effect. There is heterogeneous low-attenuation within the subcortical and periventricular white matter compatible with chronic microvascular disease. Vascular: No hyperdense vessel or unexpected calcification. Skull: Normal. Negative for fracture or focal lesion. Sinuses/Orbits: No acute finding. Other: None. CT CERVICAL SPINE FINDINGS Alignment: The alignment of the cervical spine is normal. Skull base and vertebrae: No signs of acute fracture or subluxation. Soft tissues and spinal canal: No prevertebral fluid or swelling. No visible canal hematoma. Disc levels: There are large, bulky, flowing ventral syndesmophytes throughout the cervical spine. Disc space narrowing and calcification identified at C3-4. There is also disc space narrowing at C5-6 and C7-T1. Mild bilateral facet arthropathy. Upper chest: Right base of tongue/oropharyngeal mass is again identified. This is only partially visualized on the current study, image 39/4. Other: None IMPRESSION: 1. No evidence for acute intracranial abnormality. 2. Chronic microvascular disease. 3. No evidence for acute cervical spine fracture or subluxation. 4. 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 5. 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 This is only partially visualized on the current study. See PET-CT report from 11/15/2022 for details. Electronically Signed   By: Signa Kell M.D.   On: 01/03/2023 13:37   DG Chest 2 View  Result Date: 12/30/2022 CLINICAL DATA:   Provided history: Aspiration, weakness. EXAM: CHEST - 2 VIEW COMPARISON:  PET CT 11/15/2022. FINDINGS: Heart size within normal limits. No appreciable airspace consolidation. No evidence of pleural effusion or pneumothorax. No acute osseous abnormality identified. No acute osseous abnormality identified. Dextrocurvature of the thoracic spine. Metallic screw within the distal left clavicle. IMPRESSION: No evidence of active cardiopulmonary disease. Electronically Signed   By: Jackey Loge D.O.   On: 12/30/2022 10:04   DG SWALLOW FUNC OP MEDICARE SPEECH PATH  Result Date: 12/22/2022 Table formatting from the original result was not included. Modified Barium Swallow Study Patient Details Name: FOLEY ARWOOD MRN: [id number] Date of Birth: 1950-09-12 Today's Date: 12/22/2022 HPI/PMH: HPI: Pt is a 72 year old male with past medical history of DVT who is currently undergoing concurrent chemoradiation treatment for at 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 Soft tissue neck - 09/30/2022  Infiltrating mass in the right tongue measuring   up to 4 cm. Prominent submucosal involvement including in the right   parapharyngeal fat. Assessment of relationship to the pterygoids and   extrinsic tongue musculature is somewhat hindered by streak artifact   from dental amalgam.      US biopsy - 10/22/2022  Lymph node, right cervical:  Positive for malignancy; Metastatic Squamous Cell Carcinoma, Keratinizing, in a background of dense fibrosis and tumor necrosis  An immunohistochemical study directed against p16 is strongly and   diffusely positive, indicating an HPV driven malignancy. Clinical Impression: Pt presents severe oropharyngeal dysphagia when consuming thin liquids via cup, nectar thick liquids via cup and puree.      His dysphagia is multifactorial in nature:  severe trismus - trials limited in study d/t inability to open mouth to receive spoon, graham cracker or barium tablet  increased pharyngeal weakness  (BOT/hyolaryngeal weakness resulting in decreased epiglottic ) from chemoradiation for oropharyngeal cancer  tumor burden (see CT Soft Tissue Neck)  baseline structural dysphagia d/t the appearance of large osteophytes at C4-C5     As a result, pt presents with silent aspiration of  thin liquids and nectar thick liquids and decreased intake of puree with ineffiecent pharyngeal clearing of puree resulting in multiple swallows. At this time, recommend pt be NPO with water protocol following oral care, intensive trismus and pharyngeal treatment with serious consideration for alternative means of nutrition to prevent further dehydration and malnutrition while undergoing cancer treatments. Factors that may increase risk of adverse event in presence of aspiration Rubye Oaks & Clearance Coots 2021): Factors that may increase risk of adverse event in presence of aspiration Rubye Oaks & Clearance Coots 2021): Frail or deconditioned; Inadequate oral hygiene; Reduced saliva; Weak cough (orophrayngeal mass, radiation treatment for oropharyngeal mass, weakness/deconditioning d/t weight loss associated with severe trismus; severe trismus) Recommendations/Plan: Swallowing Evaluation Recommendations Swallowing Evaluation Recommendations Recommendations: NPO; Free water protocol after oral care; Alternative means of nutrition - G Tube Medication Administration: Via alternative means Supervision: Patient able to self-feed Oral care recommendations: Oral care QID (4x/day) Recommended consults: Consider GI consultation (for G-Tube placement) Treatment Plan Treatment Plan Treatment recommendations: Other (comment) (continue Outpatient ST services) Follow-up recommendations: Outpatient SLP Functional status assessment: Patient has had a recent decline in their functional status and demonstrates the ability to make significant improvements in function in a reasonable and predictable amount of time. Recommendations Recommendations for follow up therapy are one  component of a multi-disciplinary discharge planning process, led by the attending physician.  Recommendations may be updated based on patient status, additional functional criteria and insurance authorization. Assessment: Orofacial Exam: Orofacial Exam Oral Cavity: Oral Hygiene: WFL Oral Cavity - Dentition: Adequate natural dentition Orofacial Anatomy: WFL Anatomy: Anatomy: -- (Large appearing osteophytes at C4-C5) Boluses Administered: Boluses Administered Boluses Administered: Thin liquids (Level 0); Mildly thick liquids (Level 2, nectar thick); Puree  Oral Impairment Domain: Oral Impairment Domain Lip Closure: No labial escape Tongue control during bolus hold: Cohesive bolus between tongue to palatal seal Bolus preparation/mastication: Disorganized chewing/mashing with solid pieces of bolus unchewed (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) Bolus transport/lingual motion: Brisk tongue motion; Repetitive/disorganized tongue motion Oral residue: Trace residue lining oral structures Location of oral residue : Tongue Initiation of pharyngeal swallow : Valleculae; Pyriform sinuses  Pharyngeal Impairment Domain: Pharyngeal Impairment Domain Soft palate elevation: No bolus between soft palate (SP)/pharyngeal wall (PW) Laryngeal elevation: Partial superior movement of thyroid cartilage/partial approximation of arytenoids to epiglottic petiole Anterior hyoid excursion: Partial anterior movement Epiglottic movement: Partial inversion Laryngeal vestibule closure: Incomplete, narrow column air/contrast in laryngeal vestibule Pharyngeal stripping wave : Present - diminished Pharyngoesophageal segment opening: Partial distention/partial duration, partial obstruction of flow (suspect that large osetophytes play role in clearance thru PE segment) Tongue base retraction: Trace column of contrast or air between tongue base and PPW Pharyngeal residue: Collection of residue within or on pharyngeal structures; Majority  of contrast within or on pharyngeal structures Location of pharyngeal residue: Valleculae; Pharyngeal wall; Pyriform sinuses; Diffuse (>3 areas)  Esophageal Impairment Domain: No data recorded Pill: No data recorded Penetration/Aspiration Scale Score: Penetration/Aspiration Scale Score 3.  Material enters airway, remains ABOVE vocal cords and not ejected out: Puree 8.  Material enters airway, passes BELOW cords without attempt by patient to eject out (silent aspiration) : Thin liquids (Level 0); Mildly thick liquids (Level 2, nectar thick) Compensatory Strategies: No data recorded  General Information: Caregiver present: No  Diet Prior to this Study: Dysphagia 1 (pureed); Thin liquids (Level 0)   Temperature : Normal   Respiratory Status: WFL   Supplemental O2: None (Room air)   History of Recent Intubation: No  Behavior/Cognition: Alert; Cooperative;  Pleasant mood (emotional d/t recent staging of cancer) Self-Feeding Abilities: Able to self-feed Baseline vocal quality/speech: Abnormal resonance (Wet; low vocal intensity) Volitional Cough: Able to elicit Volitional Swallow: Unable to elicit (attempted but unable to complete swallow) Exam Limitations: Other (comment) (bolus adminstration and bolus consistencies limited by severe trismus) Goal Planning: Prognosis for improved oropharyngeal function: Fair Barriers to Reach Goals: Overall medical prognosis; Severity of deficits; Medication (chemo, radiation) No data recorded No data recorded Consulted and agree with results and recommendations: Patient Pain: Pain Assessment Pain Assessment: 0-10 Pain Score: 8 Pain Location: jaw, tongue Pain Descriptors / Indicators: Aching; Constant; Contraction Pain Intervention(s): Limited activity within patient's tolerance; Monitored during session End of Session: Start Time:SLP Start Time (ACUTE ONLY): 1230 Stop Time: SLP Stop Time (ACUTE ONLY): 1300 Time Calculation:SLP Time Calculation (min) (ACUTE ONLY): 30 min Charges: SLP  Evaluations $ SLP Speech Visit: 1 Visit SLP Evaluations $MBS Swallow: 1 Procedure SLP visit diagnosis: SLP Visit Diagnosis: Dysphagia, oropharyngeal phase (R13.12) Past Medical History: Past Medical History: Diagnosis Date  Diabetes mellitus without complication (HCC)   Hyperlipidemia   Hypertension  Past Surgical History: Past Surgical History: Procedure Laterality Date  arm surgery Left   CORONARY ANGIOPLASTY WITH STENT PLACEMENT    left side rotator cuff surgery    SHOULDER SURGERY   Happi Overton 12/22/2022, 3:55 PM CLINICAL DATA:  Patient with a history of oropharyngeal squamous cell carcinoma. Patient presents today with complaints of dysphagia, throat pain. EXAM: MODIFIED BARIUM SWALLOW TECHNIQUE: Different consistencies of barium were administered orally to the patient by the Speech Pathologist. Imaging of the pharynx was performed in the lateral projection. Alwyn Ren, NP was present in the fluoroscopy room during this study, which was supervised and interpreted by Dr. Grace Isaac. FLUOROSCOPY: Radiation Exposure Index (as provided by the fluoroscopic device): 5.20 mGy Kerma COMPARISON:  Neck CT-09/28/2022 FINDINGS: Vestibular  Penetration:  Observed with all consistencies of barium. Aspiration:  Observed with thin and nectar thick barium. Other: Note is made of prominent anteriorly directed syndesmophytes within mid and inferior aspects of the cervical spine as demonstrated on recent neck CT IMPRESSION: Vestibular penetration with all consistencies of barium. Aspiration observed with thin and nectar thick barium. Read by: Alwyn Ren, NP Please refer to the Speech Pathologists report for complete details and recommendations. Electronically Signed   By: Simonne Come M.D.   On: 12/21/2022 13:38   PERFORMANCE STATUS (ECOG) : 2 - Symptomatic, <50% confined to bed  Review of Systems Unless otherwise noted, a complete review of systems is negative.  Physical Exam General: NAD Cardiovascular: regular  rate and rhythm Pulmonary: clear ant fields Abdomen: soft, nontender, + bowel sounds GU: no suprapubic tenderness Extremities: no edema, no joint deformities Skin: no rashes Neurological: Weakness but otherwise nonfocal  IMPRESSION: Patient well-known to me from the clinic.  He was admitted with a flutter and possible non-STEMI and is now on a Cardizem drip in the emergency department while awaiting an inpatient bed.  Today, patient reports that he is feeling some improvement after receiving IV fluids overnight.  He denies any symptomatic complaints or concerns at present.  Patient has severe nutritional deficiencies with significant weight loss given his head neck cancer/severe trismus/dysphagia and aspiration.  He is pending PEG.  Please consult nutrition after PEG placement as patient is at high risk of refeeding syndrome.  Patient's stated goals are aligned with continued scope of treatment.  PLAN: -Continue current scope of treatment -Recommend PEG -Plan to follow-up in the outpatient clinic  Time Total: 30 minutes  Visit consisted of counseling and education dealing with the complex and emotionally intense issues of symptom management and palliative care in the setting of serious and potentially life-threatening illness.Greater than 50%  of this time was spent counseling and coordinating care related to the above assessment and plan.  Signed by: Altha Harm, PhD, NP-C

## 2023-01-04 NOTE — ED Notes (Signed)
Pt in bed, friend/family at bedside, pt offers no complaints.

## 2023-01-04 NOTE — ED Notes (Signed)
Pt sitting up in bed, meal tray at bedside, pt sitting up eating dinner.  Pt has no requests at this time,

## 2023-01-04 NOTE — Progress Notes (Signed)
ANTICOAGULATION CONSULT NOTE  Pharmacy Consult for Heparin Indication: STEMI  No Known Allergies  Patient Measurements: Height: 5\' 9"  (175.3 cm) Weight: 78 kg (171 lb 15.3 oz) IBW/kg (Calculated) : 70.7 Heparin Dosing Weight: 78 kg  Vital Signs: Temp: 98 F (36.7 C) (05/13 2115) Temp Source: Oral (05/13 2115) BP: 108/71 (05/14 0030) Pulse Rate: 64 (05/14 0030)  Labs: Recent Labs    01/03/23 0849 01/03/23 0850 01/03/23 1032 01/03/23 1033 01/03/23 1354 01/03/23 1514 01/04/23 0041  HGB 14.1  --   --  14.9  --   --   --   HCT 40.3  --   --  42.6  --   --   --   PLT 111*  --   --  129*  --   --   --   APTT  --   --   --   --   --  24  --   LABPROT  --   --   --   --   --  15.9*  --   INR  --   --   --   --   --  1.3*  --   HEPARINUNFRC  --   --   --   --   --  <0.10* 0.29*  CREATININE  --  0.87  --  0.92  --   --   --   TROPONINIHS  --   --  2,925*  --  3,672*  --   --      Estimated Creatinine Clearance: 72.6 mL/min (by C-G formula based on SCr of 0.92 mg/dL).  Assessment: 67 yoM w/ hx CAD s/p PCI and recently off rivaroxiban for DVT, admitted with syncopal episode, new onset Afib and elevated troponins. Pharmacy has been consulted to assist with systemic heparin therapy. H/H within normal limits, noted low platelet count. Per patient account, has been off xarelto for several weeks.   Goal of Therapy:  Heparin level 0.3-0.7 units/ml Monitor platelets by anticoagulation protocol: Yes   5/14 0041 HL 0.29, slightly subtherapeutic  Plan:  Increase heparin gtt to 1050 units /hr Recheck HL in 8 hours after rate change Daily HL and CBC Monitor for any bleeding complications  Otelia Sergeant, PharmD, [facility] 01/04/2023 1:18 AM

## 2023-01-04 NOTE — Progress Notes (Signed)
ANTICOAGULATION CONSULT NOTE  Pharmacy Consult for Heparin Indication: STEMI  No Known Allergies  Patient Measurements: Height: 5\' 9"  (175.3 cm) Weight: 78 kg (171 lb 15.3 oz) IBW/kg (Calculated) : 70.7 Heparin Dosing Weight: 78 kg  Vital Signs: Temp: 98 F (36.7 C) (05/14 2120) Temp Source: Oral (05/14 1728) BP: 116/64 (05/14 2120) Pulse Rate: 65 (05/14 2120)  Labs: Recent Labs    01/03/23 0849 01/03/23 0850 01/03/23 1032 01/03/23 1033 01/03/23 1354 01/03/23 1514 01/03/23 1514 01/04/23 0041 01/04/23 0437 01/04/23 0910 01/04/23 2050  HGB 14.1  --   --  14.9  --   --   --   --  12.0*  --   --   HCT 40.3  --   --  42.6  --   --   --   --  34.5*  --   --   PLT 111*  --   --  129*  --   --   --   --  96*  --   --   APTT  --   --   --   --   --  24  --   --   --   --   --   LABPROT  --   --   --   --   --  15.9*  --   --   --   --   --   INR  --   --   --   --   --  1.3*  --   --   --   --   --   HEPARINUNFRC  --   --   --   --   --  <0.10*   < > 0.29*  --  0.27* 0.68  CREATININE  --  0.87  --  0.92  --   --   --   --  0.64  --   --   TROPONINIHS  --   --  2,925*  --  3,672*  --   --   --   --  2,721*  --    < > = values in this interval not displayed.     Estimated Creatinine Clearance: 83.5 mL/min (by C-G formula based on SCr of 0.64 mg/dL).  Assessment: 79 yoM w/ hx CAD s/p PCI and recently off rivaroxiban for DVT, admitted with syncopal episode, new onset Afib and elevated troponins. Pharmacy has been consulted to assist with systemic heparin therapy. H/H within normal limits, noted low platelet count. Per patient account, has been off xarelto for several weeks.   Goal of Therapy:  Heparin level 0.3-0.7 units/ml Monitor platelets by anticoagulation protocol: Yes   5/14 0041 HL 0.29, slightly subtherapeutic 5.14 0910 HL 0.27, subtherapeutic 5.14 2050 HL 0.68. therapeutic @ 1200 units/hr  Plan:  Heparin therapeutic continue heparin gtt at 1200 units  /hr Recheck HL in 8 hours  to confirm Daily HL and CBC Monitor for any bleeding complications  Sharen Hones, PharmD, BCPS Clinical Pharmacist   01/04/2023 9:27 PM

## 2023-01-04 NOTE — Progress Notes (Signed)
Rounding Note    Patient Name: Kenneth Gilmore Date of Encounter: 01/04/2023  [facility] Cardiologist: None  New consult done by Dr. Kirke Corin  Subjective   Patient seen on a.m. rounds.  Denies chest pain, shortness of breath, peripheral edema.  Remains in sinus rhythm on diltiazem drip at 5 mg an hour.  Also is continued on heparin infusion.  Inpatient Medications    Scheduled Meds:  aspirin EC  81 mg Oral Daily   insulin aspart  0-5 Units Subcutaneous QHS   insulin aspart  0-9 Units Subcutaneous TID WC   Continuous Infusions:  sodium chloride 125 mL/hr at 01/04/23 0721   acetaminophen     diltiazem (CARDIZEM) infusion 5 mg/hr (01/04/23 0721)   heparin 1,200 Units/hr (01/04/23 1051)   PRN Meds: acetaminophen, morphine injection, ondansetron **OR** ondansetron (ZOFRAN) IV, senna-docusate   Vital Signs    Vitals:   01/04/23 0422 01/04/23 0700 01/04/23 0723 01/04/23 1000  BP:  102/62  107/61  Pulse: 60 61  62  Resp: 14 14  15   Temp:   97.9 F (36.6 C)   TempSrc:   Oral   SpO2: 100% 99%  100%  Weight:      Height:        Intake/Output Summary (Last 24 hours) at 01/04/2023 1105 Last data filed at 01/04/2023 0112 Gross per 24 hour  Intake 45.38 ml  Output 350 ml  Net -304.62 ml      01/03/2023   10:30 AM 01/03/2023    9:20 AM 12/31/2022    9:23 AM  Last 3 Weights  Weight (lbs) 171 lb 15.3 oz 172 lb 9.6 oz 176 lb  Weight (kg) 78 kg 78.291 kg 79.833 kg      Telemetry    Sinus rhythm rates of 60s and 70s with several episodes of nonsustained V. tach with the most amount 4 beat run, and multifocal PVCs- Personally Reviewed  ECG    No new tracings- Personally Reviewed  Physical Exam   GEN: No acute distress.   Neck: No JVD Cardiac: RRR, no murmurs, rubs, or gallops.  Respiratory: Clear to auscultation bilaterally. GI: Soft, nontender, non-distended  MS: No edema; No deformity. Neuro:  Nonfocal  Psych: Normal affect   Labs    High  Sensitivity Troponin:   Recent Labs  Lab 01/03/23 1032 01/03/23 1354 01/04/23 0910  TROPONINIHS 2,925* 3,672* 2,721*     Chemistry Recent Labs  Lab 12/30/22 4098 12/31/22 0915 01/03/23 0849 01/03/23 0850 01/03/23 1032 01/03/23 1033 01/04/23 0437  NA 133* 132*  --  137  --  137 137  K 5.0 3.8  --  3.7  --  4.3 3.2*  CL 95* 96*  --  98  --  98 104  CO2 18* 18*  --  22  --  21* 22  GLUCOSE 205* 159*  --  172*  --  179* 114*  BUN 25* 24*  --  21  --  22 22  CREATININE 1.18 1.00  --  0.87  --  0.92 0.64  CALCIUM 9.3 8.8*  --  8.9  --  9.2 7.9*  MG 1.9  --  1.9  --  2.1  --   --   PROT 7.6 6.8  --  6.7  --   --   --   ALBUMIN 3.9 3.5  --  3.4*  --   --   --   AST 62* 38  --  24  --   --   --  ALT 31 26  --  19  --   --   --   ALKPHOS 76 65  --  68  --   --   --   BILITOT 1.8* 1.5*  --  1.4*  --   --   --   GFRNONAA >60 >60  --  >60  --  >60 >60  ANIONGAP 20* 18*  --  17*  --  18* 11    Lipids No results for input(s): "CHOL", "TRIG", "HDL", "LABVLDL", "LDLCALC", "CHOLHDL" in the last 168 hours.  Hematology Recent Labs  Lab 01/03/23 0849 01/03/23 1033 01/04/23 0437  WBC 7.2 7.5 5.6  RBC 4.43 4.66 3.73*  HGB 14.1 14.9 12.0*  HCT 40.3 42.6 34.5*  MCV 91.0 91.4 92.5  MCH 31.8 32.0 32.2  MCHC 35.0 35.0 34.8  RDW 12.1 12.3 12.2  PLT 111* 129* 96*   Thyroid No results for input(s): "TSH", "FREET4" in the last 168 hours.  BNPNo results for input(s): "BNP", "PROBNP" in the last 168 hours.  DDimer No results for input(s): "DDIMER" in the last 168 hours.   Radiology    ECHOCARDIOGRAM COMPLETE  Result Date: 01/04/2023    ECHOCARDIOGRAM REPORT   Patient Name:   Kenneth Gilmore Date of Exam: 01/03/2023 Medical Rec #:  [id number]       Height:       69.0 in Accession #:    [id number]      Weight:       172.0 lb Date of Birth:  20-Jul-1951        BSA:          1.938 m Patient Age:    72 years        BP:           105/55 mmHg Patient Gender: M               HR:           76 bpm.  Exam Location:  [facility] Procedure: 2D Echo, Cardiac Doppler, Color Doppler and Intracardiac            Opacification Agent Indications:     I48.92 Atrial Flutter  History:         Patient has no prior history of Echocardiogram examinations.                  Risk Factors:Hypertension, Dyslipidemia and Diabetes.  Sonographer:     Daphine Deutscher RDCS Referring Phys:  [id number] AMY N [facility] Diagnosing Phys: Yvonne Kendall MD IMPRESSIONS  1. There is a 3.3 x 1.7 cm mural thrombus near the left ventricular apex. Left ventricular ejection fraction, by estimation, is 35 to 40%. The left ventricle has moderately decreased function. The left ventricle demonstrates regional wall motion abnormalities (see scoring diagram/findings for description). Left ventricular diastolic parameters are consistent with Grade I diastolic dysfunction (impaired relaxation). There is akinesis of the left ventricular, mid-apical anterior wall and anteroseptal wall. There is akinesis of the left ventricular, apical inferior segment. There is akinesis of the left ventricular, apical segment.  2. Right ventricular systolic function is normal. The right ventricular size is mildly enlarged. There is normal pulmonary artery systolic pressure.  3. The mitral valve is abnormal. Trivial mitral valve regurgitation. No evidence of mitral stenosis.  4. The aortic valve has an indeterminant number of cusps. There is moderate thickening of the aortic valve. Aortic valve regurgitation is trivial. Aortic valve sclerosis/calcification is present, without any evidence of  aortic stenosis.  5. The inferior vena cava is normal in size with greater than 50% respiratory variability, suggesting right atrial pressure of 3 mmHg. FINDINGS  Left Ventricle: There is a 3.3 x 1.7 cm mural thrombus near the left ventricular apex. Left ventricular ejection fraction, by estimation, is 35 to 40%. The left ventricle has moderately decreased function. The left ventricle demonstrates  regional wall motion abnormalities. Definity contrast agent was given IV to delineate the left ventricular endocardial borders. The left ventricular internal cavity size was normal in size. There is no left ventricular hypertrophy. Left ventricular diastolic parameters are consistent with Grade I diastolic dysfunction (impaired relaxation). Right Ventricle: The right ventricular size is mildly enlarged. No increase in right ventricular wall thickness. Right ventricular systolic function is normal. There is normal pulmonary artery systolic pressure. The tricuspid regurgitant velocity is 2.86  m/s, and with an assumed right atrial pressure of 3 mmHg, the estimated right ventricular systolic pressure is 35.7 mmHg. Left Atrium: Left atrial size was normal in size. Right Atrium: Right atrial size was normal in size. Pericardium: There is no evidence of pericardial effusion. Mitral Valve: The mitral valve is abnormal. There is mild thickening of the anterior mitral valve leaflet(s). Trivial mitral valve regurgitation. No evidence of mitral valve stenosis. Tricuspid Valve: The tricuspid valve is normal in structure. Tricuspid valve regurgitation is mild. Aortic Valve: The aortic valve has an indeterminant number of cusps. There is moderate thickening of the aortic valve. Aortic valve regurgitation is trivial. Aortic valve sclerosis/calcification is present, without any evidence of aortic stenosis. Aortic  valve mean gradient measures 6.5 mmHg. Aortic valve peak gradient measures 12.9 mmHg. Aortic valve area, by VTI measures 1.52 cm. Pulmonic Valve: The pulmonic valve was not well visualized. Pulmonic valve regurgitation is trivial. No evidence of pulmonic stenosis. Aorta: The aortic root and ascending aorta are structurally normal, with no evidence of dilitation. Pulmonary Artery: The pulmonary artery is of normal size. Venous: The inferior vena cava is normal in size with greater than 50% respiratory variability,  suggesting right atrial pressure of 3 mmHg. IAS/Shunts: The interatrial septum was not well visualized.  LEFT VENTRICLE PLAX 2D LVIDd:         4.80 cm   Diastology LVIDs:         3.70 cm   LV e' medial:    6.64 cm/s LV PW:         1.00 cm   LV E/e' medial:  11.9 LV IVS:        1.00 cm   LV e' lateral:   7.56 cm/s LVOT diam:     2.00 cm   LV E/e' lateral: 10.5 LV SV:         54 LV SV Index:   28 LVOT Area:     3.14 cm  RIGHT VENTRICLE             IVC RV Basal diam:  4.30 cm     IVC diam: 1.20 cm RV S prime:     18.20 cm/s TAPSE (M-mode): 2.8 cm LEFT ATRIUM             Index        RIGHT ATRIUM           Index LA diam:        4.50 cm 2.32 cm/m   RA Area:     10.70 cm LA Vol (A2C):   40.1 ml 20.70 ml/m  RA Volume:   24.10  ml  12.44 ml/m LA Vol (A4C):   53.3 ml 27.51 ml/m LA Biplane Vol: 46.0 ml 23.74 ml/m  AORTIC VALVE AV Area (Vmax):    1.55 cm AV Area (Vmean):   1.46 cm AV Area (VTI):     1.52 cm AV Vmax:           179.50 cm/s AV Vmean:          120.500 cm/s AV VTI:            0.359 m AV Peak Grad:      12.9 mmHg AV Mean Grad:      6.5 mmHg LVOT Vmax:         88.77 cm/s LVOT Vmean:        56.100 cm/s LVOT VTI:          0.173 m LVOT/AV VTI ratio: 0.48  AORTA Ao Root diam: 3.40 cm Ao Asc diam:  3.40 cm MITRAL VALVE                TRICUSPID VALVE MV Area (PHT): 3.42 cm     TR Peak grad:   32.7 mmHg MV Decel Time: 222 msec     TR Vmax:        286.00 cm/s MV E velocity: 79.20 cm/s MV A velocity: 111.00 cm/s  SHUNTS MV E/A ratio:  0.71         Systemic VTI:  0.17 m                             Systemic Diam: 2.00 cm Yvonne Kendall MD Electronically signed by Yvonne Kendall MD Signature Date/Time: 01/04/2023/10:24:53 AM    Final    DG Chest Port 1 View  Result Date: 01/03/2023 CLINICAL DATA:  Syncope EXAM: PORTABLE CHEST 1 VIEW COMPARISON:  X-ray 12/30/2022 FINDINGS: Hyperinflation. Normal cardiopericardial silhouette when adjusting for technique. No consolidation, pneumothorax, effusion or edema. Overlapping  cardiac leads. Degenerative changes of the spine. Fixation pin along the distal left clavicle. IMPRESSION: Hyperinflation.  No acute cardiopulmonary disease. Electronically Signed   By: Karen Kays M.D.   On: 01/03/2023 15:10   CT Head Wo Contrast  Result Date: 01/03/2023 CLINICAL DATA:  Trauma.  History of head neck cancer. EXAM: CT HEAD WITHOUT CONTRAST CT CERVICAL SPINE WITHOUT CONTRAST TECHNIQUE: Multidetector CT imaging of the head and cervical spine was performed following the standard protocol without intravenous contrast. Multiplanar CT image reconstructions of the cervical spine were also generated. RADIATION DOSE REDUCTION: This exam was performed according to the departmental dose-optimization program which includes automated exposure control, adjustment of the mA and/or kV according to patient size and/or use of iterative reconstruction technique. COMPARISON:  MR brain 03/29/2020 FINDINGS: CT HEAD FINDINGS Brain: No evidence of acute infarction, hemorrhage, hydrocephalus, extra-axial collection or mass lesion/mass effect. There is heterogeneous low-attenuation within the subcortical and periventricular white matter compatible with chronic microvascular disease. Vascular: No hyperdense vessel or unexpected calcification. Skull: Normal. Negative for fracture or focal lesion. Sinuses/Orbits: No acute finding. Other: None. CT CERVICAL SPINE FINDINGS Alignment: The alignment of the cervical spine is normal. Skull base and vertebrae: No signs of acute fracture or subluxation. Soft tissues and spinal canal: No prevertebral fluid or swelling. No visible canal hematoma. Disc levels: There are large, bulky, flowing ventral syndesmophytes throughout the cervical spine. Disc space narrowing and calcification identified at C3-4. There is also disc space narrowing at C5-6 and C7-T1. Mild bilateral facet arthropathy.  Upper chest: Right base of tongue/oropharyngeal mass is again identified. This is only partially  visualized on the current study, image 39/4. Other: None IMPRESSION: 1. No evidence for acute intracranial abnormality. 2. Chronic microvascular disease. 3. No evidence for acute cervical spine fracture or subluxation. 4. 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 5. 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 This is only partially visualized on the current study. See PET-CT report from 11/15/2022 for details. Electronically Signed   By: Signa Kell M.D.   On: 01/03/2023 13:37   CT Cervical Spine Wo Contrast  Result Date: 01/03/2023 CLINICAL DATA:  Trauma.  History of head neck cancer. EXAM: CT HEAD WITHOUT CONTRAST CT CERVICAL SPINE WITHOUT CONTRAST TECHNIQUE: Multidetector CT imaging of the head and cervical spine was performed following the standard protocol without intravenous contrast. Multiplanar CT image reconstructions of the cervical spine were also generated. RADIATION DOSE REDUCTION: This exam was performed according to the departmental dose-optimization program which includes automated exposure control, adjustment of the mA and/or kV according to patient size and/or use of iterative reconstruction technique. COMPARISON:  MR brain 03/29/2020 FINDINGS: CT HEAD FINDINGS Brain: No evidence of acute infarction, hemorrhage, hydrocephalus, extra-axial collection or mass lesion/mass effect. There is heterogeneous low-attenuation within the subcortical and periventricular white matter compatible with chronic microvascular disease. Vascular: No hyperdense vessel or unexpected calcification. Skull: Normal. Negative for fracture or focal lesion. Sinuses/Orbits: No acute finding. Other: None. CT CERVICAL SPINE FINDINGS Alignment: The alignment of the cervical spine is normal. Skull base and vertebrae: No signs of acute fracture or subluxation. Soft tissues and spinal canal: No prevertebral fluid or swelling. No visible canal hematoma. Disc levels: There are large, bulky,  flowing ventral syndesmophytes throughout the cervical spine. Disc space narrowing and calcification identified at C3-4. There is also disc space narrowing at C5-6 and C7-T1. Mild bilateral facet arthropathy. Upper chest: Right base of tongue/oropharyngeal mass is again identified. This is only partially visualized on the current study, image 39/4. Other: None IMPRESSION: 1. No evidence for acute intracranial abnormality. 2. Chronic microvascular disease. 3. No evidence for acute cervical spine fracture or subluxation. 4. 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 5. 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 This is only partially visualized on the current study. See PET-CT report from 11/15/2022 for details. Electronically Signed   By: Signa Kell M.D.   On: 01/03/2023 13:37    Cardiac Studies  TTE 01/03/23 1. There is a 3.3 x 1.7 cm mural thrombus near the left ventricular apex.  Left ventricular ejection fraction, by estimation, is 35 to 40%. The left  ventricle has moderately decreased function. The left ventricle  demonstrates regional wall motion  abnormalities (see scoring diagram/findings for description). Left  ventricular diastolic parameters are consistent with Grade I diastolic  dysfunction (impaired relaxation). There is akinesis of the left  ventricular, mid-apical anterior wall and  anteroseptal wall. There is akinesis of the left ventricular, apical  inferior segment. There is akinesis of the left ventricular, apical  segment.   2. Right ventricular systolic function is normal. The right ventricular  size is mildly enlarged. There is normal pulmonary artery systolic  pressure.   3. The mitral valve is abnormal. Trivial mitral valve regurgitation. No  evidence of mitral stenosis.   4. The aortic valve has an indeterminant number of cusps. There is  moderate thickening of the aortic valve. Aortic valve regurgitation is  trivial. Aortic valve  sclerosis/calcification is present, without any  evidence of aortic stenosis.   5. The inferior vena cava is normal in size with greater than 50%  respiratory variability, suggesting right atrial pressure of 3 mmHg.   Patient Profile     72 y.o. 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  Assessment & Plan    Elevated high-sensitivity troponin -High-sensitivity troponin trended 2925, 3672, and 2721 -Continues to remain chest pain-free -EKG as needed for pain or changes -Continued on heparin infusion -Will need continued ischemic workup as last stent placement was in 2003 -EKG as needed for pain or changes -Continue on aspirin 81 mg daily-  New onset atrial flutter -Currently sinus rhythm -Large quantity of ectopy and 4 beat runs of nonsustained V. Tach -Once cleared to take oral medications would likely benefit from beta-blocker therapy versus diltiazem drip -Continued on heparin infusion will need to be transitioned to [facility] prior to discharge -Will likely be indefinite with episode of atrial a flutter as well as in the setting of cancer -Continue with telemetry monitoring  Near syncope/syncope with collapse -Suspect due to tachycardia in the setting of hypotension and some vasodilation from hot shower -Arrhythmic event cannot be completely excluded due to nonsustained ventricular tachycardia episodes noted on telemetry monitoring -Continue with telemetry monitoring -Will start beta-blocker and discontinue calcium channel blocker when taking oral medications -May have to consider a ZIO monitor on discharge  Hypokalemia -Serum potassium 3.2 -Potassium  supplement given -Daily BMP -Recommend keeping serum potassium greater than 4 less than 5 -Monitor/trend/replete electrolytes as needed  Coronary artery disease -Continues to remain chest pain-free -Status post PCI of the LAD and OM 2 11/07/01 -Previously followed by Dr. Lady Gary -Continue on aspirin and statin therapy when taken oral medications -Echocardiogram completed and revealed mural thrombus noted in the left ventricular apex -Continue on heparin infusion -Continue aspirin 81 mg daily -Restarted PTA statin therapy -Will likely need continued ischemic workup but will need to discuss timing with oncology to prevent delay in treatment for his cancer  Cardiomyopathy with an LVEF of 35-40% -Denies any shortness of breath, weight gain, or peripheral edema -Currently has not required any diuretic therapy -Has not required oxygen to maintain saturations -Will likely need continued ischemic evaluation to determine cause and appropriate treatment -Daily weights, I's and O's, low-sodium diet  Left ventricular mural thrombus -Continued on heparin infusion for now, will need to be transition to [facility] prior to discharge  Hypertension -Blood pressure 107/61 -Continued on diltiazem 5 mg an hour currently -Vital signs per unit protocol  Hyperlipidemia -Previously was on atorvastatin 40 mg daily -Will need to restart once taking oral medications -Lipid panel in a.m.  Type 2 diabetes -Continued on insulin -Management per IM  Stage IV oropharyngeal squamous cell carcinoma -Management per IM and oncology -Questions as to when would be an appropriate time for patient to take oral medications to transition off of diltiazem drip     For questions or updates, please contact [facility] Please consult [email or web address] for contact info under        Signed, Hila Bolding, NP  01/04/2023, 11:05 AM

## 2023-01-05 ENCOUNTER — Other Ambulatory Visit: Payer: Self-pay | Admitting: Oncology

## 2023-01-05 ENCOUNTER — Ambulatory Visit: Payer: Medicare Other

## 2023-01-05 DIAGNOSIS — R1312 Dysphagia, oropharyngeal phase: Secondary | ICD-10-CM

## 2023-01-05 DIAGNOSIS — I4892 Unspecified atrial flutter: Secondary | ICD-10-CM | POA: Diagnosis not present

## 2023-01-05 DIAGNOSIS — C109 Malignant neoplasm of oropharynx, unspecified: Secondary | ICD-10-CM

## 2023-01-05 DIAGNOSIS — I513 Intracardiac thrombosis, not elsewhere classified: Secondary | ICD-10-CM | POA: Insufficient documentation

## 2023-01-05 DIAGNOSIS — I214 Non-ST elevation (NSTEMI) myocardial infarction: Secondary | ICD-10-CM | POA: Diagnosis not present

## 2023-01-05 DIAGNOSIS — D75839 Thrombocytosis, unspecified: Secondary | ICD-10-CM | POA: Insufficient documentation

## 2023-01-05 DIAGNOSIS — I483 Typical atrial flutter: Secondary | ICD-10-CM

## 2023-01-05 DIAGNOSIS — I5022 Chronic systolic (congestive) heart failure: Secondary | ICD-10-CM | POA: Diagnosis not present

## 2023-01-05 DIAGNOSIS — E876 Hypokalemia: Secondary | ICD-10-CM | POA: Insufficient documentation

## 2023-01-05 LAB — MAGNESIUM: Magnesium: 1.6 mg/dL — ABNORMAL LOW (ref 1.7–2.4)

## 2023-01-05 LAB — BASIC METABOLIC PANEL
Anion gap: 12 (ref 5–15)
BUN: 14 mg/dL (ref 8–23)
CO2: 22 mmol/L (ref 22–32)
Calcium: 8 mg/dL — ABNORMAL LOW (ref 8.9–10.3)
Chloride: 104 mmol/L (ref 98–111)
Creatinine, Ser: 0.61 mg/dL (ref 0.61–1.24)
GFR, Estimated: >60 mL/min (ref >60)
Glucose, Bld: 98 mg/dL (ref 70–99)
Potassium: 3 mmol/L — ABNORMAL LOW (ref 3.5–5.1)
Sodium: 138 mmol/L (ref 135–145)

## 2023-01-05 LAB — CBC
HCT: 31.3 % — ABNORMAL LOW (ref 39.0–52.0)
Hemoglobin: 11.4 g/dL — ABNORMAL LOW (ref 13.0–17.0)
MCH: 32.5 pg (ref 26.0–34.0)
MCHC: 36.4 g/dL — ABNORMAL HIGH (ref 30.0–36.0)
MCV: 89.2 fL (ref 80.0–100.0)
Platelets: 96 10*3/uL — ABNORMAL LOW (ref 150–400)
RBC: 3.51 MIL/uL — ABNORMAL LOW (ref 4.22–5.81)
RDW: 12.3 % (ref 11.5–15.5)
WBC: 4.5 10*3/uL (ref 4.0–10.5)
nRBC: 0 % (ref 0.0–0.2)

## 2023-01-05 LAB — HEPARIN LEVEL (UNFRACTIONATED)
Heparin Unfractionated: 0.74 IU/mL — ABNORMAL HIGH (ref 0.30–0.70)
Heparin Unfractionated: 0.61 IU/mL (ref 0.30–0.70)
Heparin Unfractionated: 0.63 IU/mL (ref 0.30–0.70)

## 2023-01-05 LAB — LIPID PANEL
Cholesterol: 138 mg/dL (ref 0–200)
HDL: 29 mg/dL — ABNORMAL LOW (ref >40)
LDL Cholesterol: 92 mg/dL (ref 0–99)
Total CHOL/HDL Ratio: 4.8 RATIO
Triglycerides: 84 mg/dL (ref <150)
VLDL: 17 mg/dL (ref 0–40)

## 2023-01-05 MED ORDER — ATORVASTATIN CALCIUM 20 MG PO TABS
40.0000 mg | ORAL_TABLET | Freq: Every day | ORAL | Status: DC
  Administered 2023-01-05 – 2023-01-06 (×2): 40 mg via ORAL
  Filled 2023-01-05 (×4): qty 2

## 2023-01-05 MED ORDER — MAGNESIUM SULFATE 2 GM/50ML IV SOLN
2.0000 g | Freq: Once | INTRAVENOUS | Status: AC
  Administered 2023-01-05: 2 g via INTRAVENOUS
  Filled 2023-01-05: qty 50

## 2023-01-05 MED ORDER — METOPROLOL TARTRATE 25 MG PO TABS
25.0000 mg | ORAL_TABLET | Freq: Two times a day (BID) | ORAL | Status: DC
  Administered 2023-01-06 (×2): 25 mg via ORAL
  Filled 2023-01-05 (×3): qty 1

## 2023-01-05 MED ORDER — POTASSIUM CHLORIDE 20 MEQ PO PACK
40.0000 meq | PACK | Freq: Two times a day (BID) | ORAL | Status: AC
  Administered 2023-01-05 (×2): 40 meq via ORAL
  Filled 2023-01-05 (×2): qty 2

## 2023-01-05 MED ORDER — METOPROLOL TARTRATE 25 MG PO TABS
12.5000 mg | ORAL_TABLET | Freq: Once | ORAL | Status: AC
  Administered 2023-01-05: 12.5 mg via ORAL
  Filled 2023-01-05: qty 1

## 2023-01-05 MED ORDER — POTASSIUM CHLORIDE 10 MEQ/100ML IV SOLN
10.0000 meq | Freq: Once | INTRAVENOUS | Status: AC
  Administered 2023-01-05: 10 meq via INTRAVENOUS
  Filled 2023-01-05: qty 100

## 2023-01-05 NOTE — Progress Notes (Addendum)
  Progress Note   Patient: Kenneth Gilmore ZOX:[id number] DOB: [birth year] DOA: 01/03/2023     2 DOS: the patient was seen and examined on 01/05/2023   Brief hospital course: Mr. Kenneth Gilmore is a 72 year old male with non-insulin-dependent diabetes mellitus, hyperlipidemia, history of DVT on Xarelto, oropharyngeal squamous cell carcinoma stage IVa, who presents emergency department from oncology clinic for chief concerns of syncope event at home while he was in the shower. High sensitive troponin is 2925.  Telemetry showed atrial flutter, he was initially given diltiazem gtt., and quickly converted to sinus. Patient also had a significant dysphagia from oropharyngeal cancer, PEG tube is scheduled on 5/20. 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    Principal Problem:   NSTEMI (non-ST elevated myocardial infarction) (HCC) Active Problems:   Oropharyngeal cancer (HCC)   History of DVT (deep vein thrombosis)   Neoplasm related pain   Dysphagia   Diabetes mellitus type 2, noninsulin dependent (HCC)   Protein-calorie malnutrition, severe (HCC)   Benign essential tremor   Atrial flutter (HCC)   Hypotension   Syncope and collapse   Palliative care encounter   Hypomagnesemia   Hypokalemia   LV (left ventricular) mural thrombus   Chronic systolic CHF (congestive heart failure) (HCC)   Thrombocythemia   Assessment and Plan: New onset atrial flutter. Chronic systolic congestive heart failure. Elevated troponin secondary to atrial flutter and congestive heart failure. LV thrombus. Patient so far has converted to sinus rhythm, currently does not seem to have any volume overload.  Patient has been evaluated by cardiology, continue heparin drip for now.  Patient eventually will need heart workup.  Syncope and collapse. Most likely secondary to atrial flutter and drop of blood pressure.  Continue telemetry monitoring.  stage IVa  oropharyngeal squamous cell carcinoma on on concurrent chemoradiation  Dysphagia secondary to oropharyngeal cancer. Severe protein calorie malnutrition secondary dysphagia. Currently pending PEG tube placement, patient has limited p.o. intake [place].  Type 2 diabetes Glucose not significant elevated  Hypokalemia Hypomagnesemia. Repleted.  Recheck levels tomorrow.  Chronic thrombocytopenia. Platelets seem to be stable.    Subjective:  Patient has difficulty with swallowing.  No shortness of breath today.  Physical Exam: Vitals:   01/04/23 2230 01/05/23 0511 01/05/23 0810 01/05/23 1204  BP: 112/70 98/69 108/69 108/75  Pulse: 75 76 69 72  Resp: 18 18 16 18   Temp: 97.6 F (36.4 C) 97.8 F (36.6 C) 97.9 F (36.6 C) 98.3 F (36.8 C)  TempSrc: Oral Oral  Oral  SpO2: 100% 99% 98% 99%  Weight:  79.1 kg    Height:       General exam: Appears calm and comfortable, severely malnourished. Respiratory system: Clear to auscultation. Respiratory effort normal. Cardiovascular system: S1 & S2 heard, RRR. No JVD, murmurs, rubs, gallops or clicks. No pedal edema. Gastrointestinal system: Abdomen is nondistended, soft and nontender. No organomegaly or masses felt. Normal bowel sounds heard. Central nervous system: Alert and oriented. No focal neurological deficits. Extremities: Symmetric 5 x 5 power. Skin: No rashes, lesions or ulcers Psychiatry: Judgement and insight appear normal. Mood & affect appropriate.    Data Reviewed:  Reviewed results, echocardiogram results, CT results.  Family Communication: None  Disposition: Status is: Inpatient Remains inpatient appropriate because: Severity of disease, IV treatment.     Time spent: 55 minutes  Author: Marrion Coy, MD 01/05/2023 2:27 PM  For on call review [email or web address].

## 2023-01-05 NOTE — Progress Notes (Signed)
Initial Nutrition Assessment  DOCUMENTATION CODES:   Severe malnutrition in context of chronic illness  INTERVENTION:   -Once PEG is placed, recommend:   Initiate Osmolite 1.5 @ 20 ml/hr and increase by 10 ml every 12 hours to goal rate of 65 ml/hr.   60 ml Prosource TF BID.    If no IVFs, 140 ml free water flush every 6 hours  Tube feeding regimen provides 2500 kcal (100% of needs), 138 grams of protein, and 1189 ml of H2O. Total free water: 2029 ml daily  -Monitor Mg, K, and phos and replete as needed secondary to high refeeding risk -Recommend 100 mg thiamine daily x 5 days once feedings are started secondary to high refeeding risk  NUTRITION DIAGNOSIS:   Severe Malnutrition related to chronic illness (squamous cell carcinoma) as evidenced by percent weight loss, energy intake < or equal to 75% for > or equal to 1 month, severe fat depletion, moderate fat depletion, moderate muscle depletion, severe muscle depletion.  GOAL:   Patient will meet greater than or equal to 90% of their needs  MONITOR:   PO intake, Other (Comment)  REASON FOR ASSESSMENT:   Malnutrition Screening Tool, Consult Assessment of nutrition requirement/status  ASSESSMENT:   Pt with non-insulin-dependent diabetes mellitus, hyperlipidemia, history of DVT on Xarelto, oropharyngeal squamous cell carcinoma stage IVa, who presents for chief concerns of syncope event at home while he was in the shower.  Pt admitted with new onset a-flutter, syncope, and collapse.   Reviewed I/O's: +2.1 L x 24 hours and +1.8 L since admission   Case discussed with RD at cancer center. Pt has been eating minimally and had recent MBSS which revealed silent aspiration. Plan for PEG on 01/10/23.   Spoke with pt at bedside, who was very tearful at time of visit. Pt reports that he hasn't been able to eat for about 5 days due to significant dysphagia and difficulty opening up his mouth. Pt shares that he intends to get PEG  tube, but expressed being overwhelmed over life changes regarding this as well as other conditions. RD provided supportive presence and emotional support; validated feelings. Pt shares that his biggest concern is his ability to manage his TF at home; pt has two sisters who live nearby with help. RD discussed that pt receives PEG in the hospital he will have the opportunity to learn PEG care from staff prior to discharge so he will be more comfortable. Pt able expressed gratitude for outpatient RD to help familiarize him with PEG feedings.  Pt was able to consume a few sips of liquids from breakfast tray. Pt refused offer of all supplements on the formulary ("just thinking of them makes me sick").   Pt shares he has lost "a lot" of weight. Reviewed wt hx; pt has experienced a 10.3% wt loss over the past month, which is significant for time frame.   Palliative care following; pt desires full scope treatment.   Medications reviewed and include potassium chloride.   Labs reviewed: K: 3.0, CBGS: 85.   NUTRITION - FOCUSED PHYSICAL EXAM:  Flowsheet Row Most Recent Value  Orbital Region Moderate depletion  Upper Arm Region Severe depletion  Thoracic and Lumbar Region Moderate depletion  Buccal Region Severe depletion  Temple Region Severe depletion  Clavicle Bone Region Severe depletion  Clavicle and Acromion Bone Region Severe depletion  Scapular Bone Region Severe depletion  Dorsal Hand Moderate depletion  Patellar Region Moderate depletion  Anterior Thigh Region Moderate depletion  Posterior  Calf Region Moderate depletion  Edema (RD Assessment) None  Hair Reviewed  Eyes Reviewed  Mouth Reviewed  Skin Reviewed  Nails Reviewed       Diet Order:   Diet Order             Diet NPO time specified  Diet effective now           DIET - DYS 1 Room service appropriate? Yes; Fluid consistency: Thin  Diet effective now                   EDUCATION NEEDS:   Education needs have been  addressed  Skin:  Skin Assessment: Reviewed RN Assessment  Last BM:  Unknown  Height:   Ht Readings from Last 1 Encounters:  01/03/23 5\' 9"  (1.753 m)    Weight:   Wt Readings from Last 1 Encounters:  01/05/23 79.1 kg    Ideal Body Weight:  72.7 kg  BMI:  Body mass index is 25.75 kg/m.  Estimated Nutritional Needs:   Kcal:  1610-9604  Protein:  130-145 grams  Fluid:  > 2 L    Levada Schilling, RD, LDN, CDCES Registered Dietitian II Certified Diabetes Care and Education Specialist Please refer to [facility] for RD and/or RD on-call/weekend/after hours pager

## 2023-01-05 NOTE — Progress Notes (Addendum)
ANTICOAGULATION CONSULT NOTE  Pharmacy Consult for Heparin Indication: NSTEMI/mural thrombus/hx of DVT/aflutter  No Known Allergies  Patient Measurements: Height: 5\' 9"  (175.3 cm) Weight: 79.1 kg (174 lb 6.4 oz) IBW/kg (Calculated) : 70.7 Heparin Dosing Weight: 78 kg  Vital Signs: Temp: 97.8 F (36.6 C) (05/15 0511) Temp Source: Oral (05/15 0511) BP: 98/69 (05/15 0511) Pulse Rate: 76 (05/15 0511)  Labs: Recent Labs    01/03/23 0849 01/03/23 1032 01/03/23 1033 01/03/23 1354 01/03/23 1514 01/04/23 0041 01/04/23 0437 01/04/23 0910 01/04/23 2050 01/05/23 0530  HGB   < >  --  14.9  --   --   --  12.0*  --   --  11.4*  HCT  --   --  42.6  --   --   --  34.5*  --   --  31.3*  PLT  --   --  129*  --   --   --  96*  --   --  96*  APTT  --   --   --   --  24  --   --   --   --   --   LABPROT  --   --   --   --  15.9*  --   --   --   --   --   INR  --   --   --   --  1.3*  --   --   --   --   --   HEPARINUNFRC  --   --   --   --  <0.10*   < >  --  0.27* 0.68 0.74*  CREATININE  --   --  0.92  --   --   --  0.64  --   --  0.61  TROPONINIHS  --  2,925*  --  3,672*  --   --   --  2,721*  --   --    < > = values in this interval not displayed.     Estimated Creatinine Clearance: 83.5 mL/min (by C-G formula based on SCr of 0.61 mg/dL).  Assessment: 74 yoM w/ hx CAD s/p PCI and recently off rivaroxiban for hx of DVT, admitted with syncopal episode, new onset Afib and elevated troponins. Pharmacy has been consulted to assist with systemic heparin therapy. Hgb and plt are trending down. Per patient account, has been off xarelto for several weeks due to difficulty swallowing and [facility]. Pt is on asa 81. Echo showed possible mural thrombus. New onset aflutter (CHADSVASc 4)  Goal of Therapy:  Heparin level 0.3-0.7 units/ml Monitor platelets by anticoagulation protocol: Yes   5/14 0041 HL 0.29, slightly subtherapeutic 5.14 0910 HL 0.27, subtherapeutic 5.14 2050 HL  0.68. therapeutic @ 1200 units/hr  Plan:  Heparin level is slightly supratherapeutic. Will decrease heparin infusion to 1100 units/hr. Recheck heparin level in 8 hours and CBC daily while on heparin. Continue to monitor plt.   Ronnald Ramp, PharmD, BCPS Clinical Pharmacist   01/05/2023 6:38 AM

## 2023-01-05 NOTE — Progress Notes (Signed)
ANTICOAGULATION CONSULT NOTE  Pharmacy Consult for Heparin Indication: NSTEMI/mural thrombus/hx of DVT/aflutter  No Known Allergies  Patient Measurements: Height: 5\' 9"  (175.3 cm) Weight: 79.1 kg (174 lb 6.4 oz) IBW/kg (Calculated) : 70.7 Heparin Dosing Weight: 78 kg  Vital Signs: Temp: 98.3 F (36.8 C) (05/15 1204) Temp Source: Oral (05/15 1204) BP: 108/75 (05/15 1204) Pulse Rate: 72 (05/15 1204)  Labs: Recent Labs    01/03/23 0849 01/03/23 1032 01/03/23 1033 01/03/23 1354 01/03/23 1514 01/04/23 0041 01/04/23 0437 01/04/23 0910 01/04/23 2050 01/05/23 0530  HGB   < >  --  14.9  --   --   --  12.0*  --   --  11.4*  HCT  --   --  42.6  --   --   --  34.5*  --   --  31.3*  PLT  --   --  129*  --   --   --  96*  --   --  96*  APTT  --   --   --   --  24  --   --   --   --   --   LABPROT  --   --   --   --  15.9*  --   --   --   --   --   INR  --   --   --   --  1.3*  --   --   --   --   --   HEPARINUNFRC  --   --   --   --  <0.10*   < >  --  0.27* 0.68 0.74*  CREATININE  --   --  0.92  --   --   --  0.64  --   --  0.61  TROPONINIHS  --  2,925*  --  3,672*  --   --   --  2,721*  --   --    < > = values in this interval not displayed.     Estimated Creatinine Clearance: 83.5 mL/min (by C-G formula based on SCr of 0.61 mg/dL).  Assessment: 57 yoM w/ hx CAD s/p PCI and recently off rivaroxiban for hx of DVT, admitted with syncopal episode, new onset Afib and elevated troponins. Pharmacy has been consulted to assist with systemic heparin therapy. Hgb and plt are trending down. Per patient account, has been off xarelto for several weeks due to difficulty swallowing and [facility]. Pt is on asa 81. Echo showed possible mural thrombus. New onset aflutter (CHADSVASc 4)  Goal of Therapy:  Heparin level 0.3-0.7 units/ml Monitor platelets by anticoagulation protocol: Yes  Plan: heparin level is therapeutic following recent rate change ---continue heparin infusion  at 1100 units/hr. ---recheck heparin level in 8 hours to confirm and once daily while on IV heparin ---Continue to monitor CBC daily while on IV heparin   Lowella Bandy, PharmD, BCPS Clinical Pharmacist   01/05/2023 2:31 PM

## 2023-01-05 NOTE — Progress Notes (Signed)
Rounding Note    Patient Name: Kenneth Gilmore Date of Encounter: 01/05/2023  [facility] Cardiologist: None   Subjective   Patient seen on a.m. rounds.  Sitting on the bedside attempting to eat breakfast.  Denies any chest pain or shortness of breath.  Has been continued on heparin infusion.  Inpatient Medications    Scheduled Meds:  aspirin EC  81 mg Oral Daily   metoprolol tartrate  12.5 mg Oral BID   potassium chloride  40 mEq Oral BID   Continuous Infusions:  sodium chloride 50 mL/hr at 01/04/23 1342   [START ON 01/10/2023]  ceFAZolin (ANCEF) IV     heparin 1,200 Units/hr (01/04/23 2100)   magnesium sulfate bolus IVPB     potassium chloride     PRN Meds: ondansetron **OR** ondansetron (ZOFRAN) IV, senna-docusate   Vital Signs    Vitals:   01/04/23 2120 01/04/23 2230 01/05/23 0511 01/05/23 0810  BP: 116/64 112/70 98/69 108/69  Pulse: 65 75 76 69  Resp: 18 18 18 16   Temp: 98 F (36.7 C) 97.6 F (36.4 C) 97.8 F (36.6 C) 97.9 F (36.6 C)  TempSrc:  Oral Oral   SpO2: 96% 100% 99% 98%  Weight:   79.1 kg   Height:        Intake/Output Summary (Last 24 hours) at 01/05/2023 0852 Last data filed at 01/04/2023 1629 Gross per 24 hour  Intake 2060.12 ml  Output --  Net 2060.12 ml      01/05/2023    5:11 AM 01/03/2023   10:30 AM 01/03/2023    9:20 AM  Last 3 Weights  Weight (lbs) 174 lb 6.4 oz 171 lb 15.3 oz 172 lb 9.6 oz  Weight (kg) 79.107 kg 78 kg 78.291 kg      Telemetry    Sinus rhythm with continued episodes of nonsustained V. tach 45 beats with frequent unifocal PVCs rates of 60-70- Personally Reviewed  ECG    No new tracings- Personally Reviewed  Physical Exam   GEN: No acute distress.   Neck: No JVD Cardiac: RRR, no murmurs, rubs, or gallops.  Respiratory: Clear to auscultation bilaterally. GI: Soft, nontender, non-distended  MS: No edema; No deformity. Neuro:  Nonfocal  Psych: Normal affect   Labs    High Sensitivity  Troponin:   Recent Labs  Lab 01/03/23 1032 01/03/23 1354 01/04/23 0910  TROPONINIHS 2,925* 3,672* 2,721*     Chemistry Recent Labs  Lab 12/30/22 1610 12/31/22 0915 01/03/23 0849 01/03/23 0850 01/03/23 1032 01/03/23 1033 01/04/23 0437 01/05/23 0530  NA 133* 132*  --  137  --  137 137 138  K 5.0 3.8  --  3.7  --  4.3 3.2* 3.0*  CL 95* 96*  --  98  --  98 104 104  CO2 18* 18*  --  22  --  21* 22 22  GLUCOSE 205* 159*  --  172*  --  179* 114* 98  BUN 25* 24*  --  21  --  22 22 14   CREATININE 1.18 1.00  --  0.87  --  0.92 0.64 0.61  CALCIUM 9.3 8.8*  --  8.9  --  9.2 7.9* 8.0*  MG 1.9  --  1.9  --  2.1  --   --  1.6*  PROT 7.6 6.8  --  6.7  --   --   --   --   ALBUMIN 3.9 3.5  --  3.4*  --   --   --   --  AST 62* 38  --  24  --   --   --   --   ALT 31 26  --  19  --   --   --   --   ALKPHOS 76 65  --  68  --   --   --   --   BILITOT 1.8* 1.5*  --  1.4*  --   --   --   --   GFRNONAA >60 >60  --  >60  --  >60 >60 >60  ANIONGAP 20* 18*  --  17*  --  18* 11 12    Lipids  Recent Labs  Lab 01/05/23 0530  CHOL 138  TRIG 84  HDL 29*  LDLCALC 92  CHOLHDL 4.8    Hematology Recent Labs  Lab 01/03/23 1033 01/04/23 0437 01/05/23 0530  WBC 7.5 5.6 4.5  RBC 4.66 3.73* 3.51*  HGB 14.9 12.0* 11.4*  HCT 42.6 34.5* 31.3*  MCV 91.4 92.5 89.2  MCH 32.0 32.2 32.5  MCHC 35.0 34.8 36.4*  RDW 12.3 12.2 12.3  PLT 129* 96* 96*   Thyroid No results for input(s): "TSH", "FREET4" in the last 168 hours.  BNPNo results for input(s): "BNP", "PROBNP" in the last 168 hours.  DDimer No results for input(s): "DDIMER" in the last 168 hours.   Radiology    ECHOCARDIOGRAM COMPLETE  Result Date: 01/04/2023    ECHOCARDIOGRAM REPORT   Patient Name:   Kenneth Gilmore Date of Exam: 01/03/2023 Medical Rec #:  [id number]       Height:       69.0 in Accession #:    [id number]      Weight:       172.0 lb Date of Birth:  1951/05/14        BSA:          1.938 m Patient Age:    72 years        BP:            105/55 mmHg Patient Gender: M               HR:           76 bpm. Exam Location:  [facility] Procedure: 2D Echo, Cardiac Doppler, Color Doppler and Intracardiac            Opacification Agent Indications:     I48.92 Atrial Flutter  History:         Patient has no prior history of Echocardiogram examinations.                  Risk Factors:Hypertension, Dyslipidemia and Diabetes.  Sonographer:     Daphine Deutscher RDCS Referring Phys:  [id number] AMY N COX Diagnosing Phys: Yvonne Kendall MD IMPRESSIONS  1. There is a 3.3 x 1.7 cm mural thrombus near the left ventricular apex. Left ventricular ejection fraction, by estimation, is 35 to 40%. The left ventricle has moderately decreased function. The left ventricle demonstrates regional wall motion abnormalities (see scoring diagram/findings for description). Left ventricular diastolic parameters are consistent with Grade I diastolic dysfunction (impaired relaxation). There is akinesis of the left ventricular, mid-apical anterior wall and anteroseptal wall. There is akinesis of the left ventricular, apical inferior segment. There is akinesis of the left ventricular, apical segment.  2. Right ventricular systolic function is normal. The right ventricular size is mildly enlarged. There is normal pulmonary artery systolic pressure.  3. The mitral valve is abnormal. Trivial mitral  valve regurgitation. No evidence of mitral stenosis.  4. The aortic valve has an indeterminant number of cusps. There is moderate thickening of the aortic valve. Aortic valve regurgitation is trivial. Aortic valve sclerosis/calcification is present, without any evidence of aortic stenosis.  5. The inferior vena cava is normal in size with greater than 50% respiratory variability, suggesting right atrial pressure of 3 mmHg. FINDINGS  Left Ventricle: There is a 3.3 x 1.7 cm mural thrombus near the left ventricular apex. Left ventricular ejection fraction, by estimation, is 35 to 40%. The left  ventricle has moderately decreased function. The left ventricle demonstrates regional wall motion abnormalities. Definity contrast agent was given IV to delineate the left ventricular endocardial borders. The left ventricular internal cavity size was normal in size. There is no left ventricular hypertrophy. Left ventricular diastolic parameters are consistent with Grade I diastolic dysfunction (impaired relaxation). Right Ventricle: The right ventricular size is mildly enlarged. No increase in right ventricular wall thickness. Right ventricular systolic function is normal. There is normal pulmonary artery systolic pressure. The tricuspid regurgitant velocity is 2.86  m/s, and with an assumed right atrial pressure of 3 mmHg, the estimated right ventricular systolic pressure is 35.7 mmHg. Left Atrium: Left atrial size was normal in size. Right Atrium: Right atrial size was normal in size. Pericardium: There is no evidence of pericardial effusion. Mitral Valve: The mitral valve is abnormal. There is mild thickening of the anterior mitral valve leaflet(s). Trivial mitral valve regurgitation. No evidence of mitral valve stenosis. Tricuspid Valve: The tricuspid valve is normal in structure. Tricuspid valve regurgitation is mild. Aortic Valve: The aortic valve has an indeterminant number of cusps. There is moderate thickening of the aortic valve. Aortic valve regurgitation is trivial. Aortic valve sclerosis/calcification is present, without any evidence of aortic stenosis. Aortic  valve mean gradient measures 6.5 mmHg. Aortic valve peak gradient measures 12.9 mmHg. Aortic valve area, by VTI measures 1.52 cm. Pulmonic Valve: The pulmonic valve was not well visualized. Pulmonic valve regurgitation is trivial. No evidence of pulmonic stenosis. Aorta: The aortic root and ascending aorta are structurally normal, with no evidence of dilitation. Pulmonary Artery: The pulmonary artery is of normal size. Venous: The inferior vena  cava is normal in size with greater than 50% respiratory variability, suggesting right atrial pressure of 3 mmHg. IAS/Shunts: The interatrial septum was not well visualized.  LEFT VENTRICLE PLAX 2D LVIDd:         4.80 cm   Diastology LVIDs:         3.70 cm   LV e' medial:    6.64 cm/s LV PW:         1.00 cm   LV E/e' medial:  11.9 LV IVS:        1.00 cm   LV e' lateral:   7.56 cm/s LVOT diam:     2.00 cm   LV E/e' lateral: 10.5 LV SV:         54 LV SV Index:   28 LVOT Area:     3.14 cm  RIGHT VENTRICLE             IVC RV Basal diam:  4.30 cm     IVC diam: 1.20 cm RV S prime:     18.20 cm/s TAPSE (M-mode): 2.8 cm LEFT ATRIUM             Index        RIGHT ATRIUM  Index LA diam:        4.50 cm 2.32 cm/m   RA Area:     10.70 cm LA Vol (A2C):   40.1 ml 20.70 ml/m  RA Volume:   24.10 ml  12.44 ml/m LA Vol (A4C):   53.3 ml 27.51 ml/m LA Biplane Vol: 46.0 ml 23.74 ml/m  AORTIC VALVE AV Area (Vmax):    1.55 cm AV Area (Vmean):   1.46 cm AV Area (VTI):     1.52 cm AV Vmax:           179.50 cm/s AV Vmean:          120.500 cm/s AV VTI:            0.359 m AV Peak Grad:      12.9 mmHg AV Mean Grad:      6.5 mmHg LVOT Vmax:         88.77 cm/s LVOT Vmean:        56.100 cm/s LVOT VTI:          0.173 m LVOT/AV VTI ratio: 0.48  AORTA Ao Root diam: 3.40 cm Ao Asc diam:  3.40 cm MITRAL VALVE                TRICUSPID VALVE MV Area (PHT): 3.42 cm     TR Peak grad:   32.7 mmHg MV Decel Time: 222 msec     TR Vmax:        286.00 cm/s MV E velocity: 79.20 cm/s MV A velocity: 111.00 cm/s  SHUNTS MV E/A ratio:  0.71         Systemic VTI:  0.17 m                             Systemic Diam: 2.00 cm Yvonne Kendall MD Electronically signed by Yvonne Kendall MD Signature Date/Time: 01/04/2023/10:24:53 AM    Final    DG Chest Port 1 View  Result Date: 01/03/2023 CLINICAL DATA:  Syncope EXAM: PORTABLE CHEST 1 VIEW COMPARISON:  X-ray 12/30/2022 FINDINGS: Hyperinflation. Normal cardiopericardial silhouette when adjusting for  technique. No consolidation, pneumothorax, effusion or edema. Overlapping cardiac leads. Degenerative changes of the spine. Fixation pin along the distal left clavicle. IMPRESSION: Hyperinflation.  No acute cardiopulmonary disease. Electronically Signed   By: Karen Kays M.D.   On: 01/03/2023 15:10   CT Head Wo Contrast  Result Date: 01/03/2023 CLINICAL DATA:  Trauma.  History of head neck cancer. EXAM: CT HEAD WITHOUT CONTRAST CT CERVICAL SPINE WITHOUT CONTRAST TECHNIQUE: Multidetector CT imaging of the head and cervical spine was performed following the standard protocol without intravenous contrast. Multiplanar CT image reconstructions of the cervical spine were also generated. RADIATION DOSE REDUCTION: This exam was performed according to the departmental dose-optimization program which includes automated exposure control, adjustment of the mA and/or kV according to patient size and/or use of iterative reconstruction technique. COMPARISON:  MR brain 03/29/2020 FINDINGS: CT HEAD FINDINGS Brain: No evidence of acute infarction, hemorrhage, hydrocephalus, extra-axial collection or mass lesion/mass effect. There is heterogeneous low-attenuation within the subcortical and periventricular white matter compatible with chronic microvascular disease. Vascular: No hyperdense vessel or unexpected calcification. Skull: Normal. Negative for fracture or focal lesion. Sinuses/Orbits: No acute finding. Other: None. CT CERVICAL SPINE FINDINGS Alignment: The alignment of the cervical spine is normal. Skull base and vertebrae: No signs of acute fracture or subluxation. Soft tissues and spinal canal: No prevertebral fluid or swelling.  No visible canal hematoma. Disc levels: There are large, bulky, flowing ventral syndesmophytes throughout the cervical spine. Disc space narrowing and calcification identified at C3-4. There is also disc space narrowing at C5-6 and C7-T1. Mild bilateral facet arthropathy. Upper chest: Right base  of tongue/oropharyngeal mass is again identified. This is only partially visualized on the current study, image 39/4. Other: None IMPRESSION: 1. No evidence for acute intracranial abnormality. 2. Chronic microvascular disease. 3. No evidence for acute cervical spine fracture or subluxation. 4. 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 5. 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 This is only partially visualized on the current study. See PET-CT report from 11/15/2022 for details. Electronically Signed   By: Signa Kell M.D.   On: 01/03/2023 13:37   CT Cervical Spine Wo Contrast  Result Date: 01/03/2023 CLINICAL DATA:  Trauma.  History of head neck cancer. EXAM: CT HEAD WITHOUT CONTRAST CT CERVICAL SPINE WITHOUT CONTRAST TECHNIQUE: Multidetector CT imaging of the head and cervical spine was performed following the standard protocol without intravenous contrast. Multiplanar CT image reconstructions of the cervical spine were also generated. RADIATION DOSE REDUCTION: This exam was performed according to the departmental dose-optimization program which includes automated exposure control, adjustment of the mA and/or kV according to patient size and/or use of iterative reconstruction technique. COMPARISON:  MR brain 03/29/2020 FINDINGS: CT HEAD FINDINGS Brain: No evidence of acute infarction, hemorrhage, hydrocephalus, extra-axial collection or mass lesion/mass effect. There is heterogeneous low-attenuation within the subcortical and periventricular white matter compatible with chronic microvascular disease. Vascular: No hyperdense vessel or unexpected calcification. Skull: Normal. Negative for fracture or focal lesion. Sinuses/Orbits: No acute finding. Other: None. CT CERVICAL SPINE FINDINGS Alignment: The alignment of the cervical spine is normal. Skull base and vertebrae: No signs of acute fracture or subluxation. Soft tissues and spinal canal: No prevertebral fluid or  swelling. No visible canal hematoma. Disc levels: There are large, bulky, flowing ventral syndesmophytes throughout the cervical spine. Disc space narrowing and calcification identified at C3-4. There is also disc space narrowing at C5-6 and C7-T1. Mild bilateral facet arthropathy. Upper chest: Right base of tongue/oropharyngeal mass is again identified. This is only partially visualized on the current study, image 39/4. Other: None IMPRESSION: 1. No evidence for acute intracranial abnormality. 2. Chronic microvascular disease. 3. No evidence for acute cervical spine fracture or subluxation. 4. 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 5. 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 This is only partially visualized on the current study. See PET-CT report from 11/15/2022 for details. Electronically Signed   By: Signa Kell M.D.   On: 01/03/2023 13:37    Cardiac Studies   TTE 01/03/23 1. There is a 3.3 x 1.7 cm mural thrombus near the left ventricular apex.  Left ventricular ejection fraction, by estimation, is 35 to 40%. The left  ventricle has moderately decreased function. The left ventricle  demonstrates regional wall motion  abnormalities (see scoring diagram/findings for description). Left  ventricular diastolic parameters are consistent with Grade I diastolic  dysfunction (impaired relaxation). There is akinesis of the left  ventricular, mid-apical anterior wall and  anteroseptal wall. There is akinesis of the left ventricular, apical  inferior segment. There is akinesis of the left ventricular, apical  segment.   2. Right ventricular systolic function is normal. The right ventricular  size is mildly enlarged. There is normal pulmonary artery systolic  pressure.   3. The mitral valve is abnormal. Trivial mitral valve regurgitation. No  evidence of mitral stenosis.   4. The aortic valve has an indeterminant number of cusps. There is  moderate thickening of  the aortic valve. Aortic valve regurgitation is  trivial. Aortic valve sclerosis/calcification is present, without any  evidence of aortic stenosis.   5. The inferior vena cava is normal in size with greater than 50%  respiratory variability, suggesting right atrial pressure of 3 mmHg.   Patient Profile     71 y.o. 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  Assessment & Plan    Elevated high-sensitivity troponin/NSTEMI -High-sensitivity troponin peaked at 3672 -Continues to remain chest pain-free -Continued on heparin infusion -Continued on aspirin and statin -Last heart catheterization with stent placement was done in 2003 -Ischemic workup pending -EKG as needed for pain or changes  New onset atrial flutter -Currently maintaining sinus rhythm -Continued on metoprolol 12.5 mg twice daily -Currently on aspirin infusion but will need to be transitioned to [facility] prior to discharge -Will likely require indefinite anticoagulation with atrial flutter, mural thrombus, and cancer diagnoses  Near syncope/syncope with collapse -Likely due to tachycardia in the setting of hypotension and some vasa dilatation from hot shower -Arrhythmic event cannot completely be excluded due to nonsustained ventricular tachycardia episodes noted on telemetry monitoring -Continue with telemetry monitoring -Continue metoprolol 12.5 mg twice daily  Hypokalemia -Serum potassium 3.0 -Potassium supplements ordered -Daily BMP -Recommend keeping potassium level greater than 4 less than 5 -Monitor/trend/replete electrolytes as needed  Cardiomyopathy with LVEF of 35-40% -Denies any  shortness of breath, weight gain, peripheral edema -Has not required any diuretic therapy -Maintaining saturations on room air -Continued on metoprolol to tartrate 12.5 mg twice daily and off of diltiazem since tolerating some oral medications due to low EF -Will continue to escalate GDMT as tolerated by blood pressure and ability for patient to swallow oral medications -Ischemic workup continues to be pending -Daily weights, I's and O's, low-sodium diet  Coronary artery disease -Continues to remain chest pain-free -Previously followed by Dr. 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 -Continued on heparin infusion for now but will need to be transition to [facility] prior to discharge  Hypertension -Blood pressure 108/69 -Continued on metoprolol tartrate 12.5 mg twice daily -Vital signs per unit protocol  Hyperlipidemia -LDL 92 -Restarted on atorvastatin 40 mg daily  Type 2 diabetes -Continued on insulin -Management per IM  Stage IV oropharyngeal squamous cell carcinoma -Management per IM and oncology     For questions or updates, please contact  [facility] Please consult [email or web address] for contact info under        Signed, Lalita Ebel, NP  01/05/2023, 8:52 AM

## 2023-01-05 NOTE — Progress Notes (Signed)
01/04/2023 2215 Received pt to room 245 from ED with C/C syncope/fall and DX of NSTEMI.  Pt is A&O, no C/O voiced.  Tele monitor applied and CCMD notified.  Oriented to room, call light and bed.  Call bell in reach. Kathryne Hitch

## 2023-01-06 ENCOUNTER — Encounter: Admission: RE | Payer: Self-pay | Source: Home / Self Care

## 2023-01-06 ENCOUNTER — Ambulatory Visit: Payer: Medicare Other | Admitting: Oncology

## 2023-01-06 ENCOUNTER — Other Ambulatory Visit: Payer: Medicare Other | Admitting: Radiology

## 2023-01-06 ENCOUNTER — Other Ambulatory Visit: Payer: Medicare Other

## 2023-01-06 ENCOUNTER — Ambulatory Visit: Payer: Medicare Other

## 2023-01-06 ENCOUNTER — Inpatient Hospital Stay: Payer: Medicare Other | Admitting: Oncology

## 2023-01-06 ENCOUNTER — Ambulatory Visit: Admission: RE | Admit: 2023-01-06 | Payer: Medicare Other | Source: Home / Self Care | Admitting: Gastroenterology

## 2023-01-06 ENCOUNTER — Inpatient Hospital Stay: Payer: Medicare Other

## 2023-01-06 ENCOUNTER — Other Ambulatory Visit: Payer: Self-pay

## 2023-01-06 ENCOUNTER — Other Ambulatory Visit: Payer: Self-pay | Admitting: Oncology

## 2023-01-06 DIAGNOSIS — E119 Type 2 diabetes mellitus without complications: Secondary | ICD-10-CM

## 2023-01-06 DIAGNOSIS — E876 Hypokalemia: Secondary | ICD-10-CM

## 2023-01-06 DIAGNOSIS — I214 Non-ST elevation (NSTEMI) myocardial infarction: Secondary | ICD-10-CM | POA: Diagnosis not present

## 2023-01-06 DIAGNOSIS — Z7984 Long term (current) use of oral hypoglycemic drugs: Secondary | ICD-10-CM

## 2023-01-06 DIAGNOSIS — I420 Dilated cardiomyopathy: Secondary | ICD-10-CM

## 2023-01-06 DIAGNOSIS — I4892 Unspecified atrial flutter: Secondary | ICD-10-CM | POA: Diagnosis not present

## 2023-01-06 DIAGNOSIS — E639 Nutritional deficiency, unspecified: Secondary | ICD-10-CM | POA: Diagnosis not present

## 2023-01-06 DIAGNOSIS — C109 Malignant neoplasm of oropharynx, unspecified: Secondary | ICD-10-CM | POA: Diagnosis not present

## 2023-01-06 HISTORY — DX: Malignant neoplasm of oropharynx, unspecified: C10.9

## 2023-01-06 LAB — BASIC METABOLIC PANEL
Anion gap: 14 (ref 5–15)
BUN: 13 mg/dL (ref 8–23)
CO2: 22 mmol/L (ref 22–32)
Calcium: 8 mg/dL — ABNORMAL LOW (ref 8.9–10.3)
Chloride: 100 mmol/L (ref 98–111)
Creatinine, Ser: 0.67 mg/dL (ref 0.61–1.24)
GFR, Estimated: >60 mL/min (ref >60)
Glucose, Bld: 103 mg/dL — ABNORMAL HIGH (ref 70–99)
Potassium: 3 mmol/L — ABNORMAL LOW (ref 3.5–5.1)
Sodium: 136 mmol/L (ref 135–145)

## 2023-01-06 LAB — CBC
HCT: 30.2 % — ABNORMAL LOW (ref 39.0–52.0)
Hemoglobin: 10.8 g/dL — ABNORMAL LOW (ref 13.0–17.0)
MCH: 32.1 pg (ref 26.0–34.0)
MCHC: 35.8 g/dL (ref 30.0–36.0)
MCV: 89.9 fL (ref 80.0–100.0)
Platelets: 93 10*3/uL — ABNORMAL LOW (ref 150–400)
RBC: 3.36 MIL/uL — ABNORMAL LOW (ref 4.22–5.81)
RDW: 12.1 % (ref 11.5–15.5)
WBC: 3.4 10*3/uL — ABNORMAL LOW (ref 4.0–10.5)
nRBC: 0 % (ref 0.0–0.2)

## 2023-01-06 LAB — GLUCOSE, CAPILLARY
Glucose-Capillary: 107 mg/dL — ABNORMAL HIGH (ref 70–99)
Glucose-Capillary: 174 mg/dL — ABNORMAL HIGH (ref 70–99)
Glucose-Capillary: 115 mg/dL — ABNORMAL HIGH (ref 70–99)

## 2023-01-06 LAB — MAGNESIUM: Magnesium: 1.8 mg/dL (ref 1.7–2.4)

## 2023-01-06 LAB — HEPARIN LEVEL (UNFRACTIONATED): Heparin Unfractionated: 0.52 IU/mL (ref 0.30–0.70)

## 2023-01-06 SURGERY — ESOPHAGOGASTRODUODENOSCOPY (EGD) WITH PROPOFOL
Anesthesia: General

## 2023-01-06 MED ORDER — ENSURE ENLIVE PO LIQD
237.0000 mL | Freq: Three times a day (TID) | ORAL | Status: DC
  Administered 2023-01-06: 237 mL via ORAL

## 2023-01-06 MED ORDER — POTASSIUM CHLORIDE 10 MEQ/100ML IV SOLN
10.0000 meq | INTRAVENOUS | Status: AC
  Administered 2023-01-06 (×4): 10 meq via INTRAVENOUS
  Filled 2023-01-06 (×4): qty 100

## 2023-01-06 MED ORDER — CEFAZOLIN SODIUM-DEXTROSE 2-4 GM/100ML-% IV SOLN: 2.0000 g | INTRAVENOUS | Status: DC

## 2023-01-06 MED ORDER — LACTULOSE 10 GM/15ML PO SOLN
20.0000 g | Freq: Once | ORAL | Status: AC
  Administered 2023-01-06: 20 g via ORAL
  Filled 2023-01-06: qty 30

## 2023-01-06 MED ORDER — POTASSIUM CHLORIDE 10 MEQ/100ML IV SOLN
10.0000 meq | INTRAVENOUS | Status: DC
  Filled 2023-01-06 (×2): qty 100

## 2023-01-06 MED ORDER — CEFAZOLIN SODIUM-DEXTROSE 2-4 GM/100ML-% IV SOLN: 2.0000 g | INTRAVENOUS | Status: AC

## 2023-01-06 MED ORDER — POTASSIUM CHLORIDE 20 MEQ PO PACK
40.0000 meq | PACK | ORAL | Status: DC
  Administered 2023-01-06: 40 meq via ORAL
  Filled 2023-01-06: qty 2

## 2023-01-06 NOTE — Progress Notes (Signed)
Progress Note   Patient: Kenneth Gilmore:[id number] DOB: 1951-05-13 DOA: 01/03/2023     3 DOS: the patient was seen and examined on 01/06/2023   Brief hospital course: Mr. Jairus Martus is a 72 year old male with non-insulin-dependent diabetes mellitus, hyperlipidemia, history of DVT on Xarelto, oropharyngeal squamous cell carcinoma stage IVa, who presents emergency department from oncology clinic for chief concerns of syncope event at home while he was in the shower. High sensitive troponin is 2925.  Telemetry showed atrial flutter, he was initially given diltiazem gtt., and quickly converted to sinus. Patient also had a significant dysphagia from oropharyngeal cancer, PEG tube is scheduled on 5/20. 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    Principal Problem:   NSTEMI (non-ST elevated myocardial infarction) (HCC) Active Problems:   Oropharyngeal cancer (HCC)   History of DVT (deep vein thrombosis)   Neoplasm related pain   Dysphagia   Diabetes mellitus type 2, noninsulin dependent (HCC)   Protein-calorie malnutrition, severe (HCC)   Benign essential tremor   Atrial flutter (HCC)   Hypotension   Syncope and collapse   Palliative care encounter   Hypomagnesemia   Hypokalemia   LV (left ventricular) mural thrombus   Chronic systolic CHF (congestive heart failure) (HCC)   Thrombocythemia   Assessment and Plan: New onset atrial flutter. Chronic systolic congestive heart failure. Elevated troponin secondary to atrial flutter and congestive heart failure. LV thrombus. Patient so far has converted to sinus rhythm, currently does not seem to have any volume overload.  Patient has been evaluated by cardiology, continue heparin drip for now.  Patient eventually will need heart workup.   Syncope and collapse. Most likely secondary to atrial flutter and drop of blood pressure.  Continue telemetry monitoring.   stage IVa  oropharyngeal squamous cell carcinoma on on concurrent chemoradiation  Dysphagia secondary to oropharyngeal cancer. Severe protein calorie malnutrition secondary dysphagia. Patient is pending PEG 5/20.  Patient has limited p.o. intake, I will start Ensure 3 times a day.   Type 2 diabetes Glucose not significant elevated   Hypokalemia Hypomagnesemia. Give IV KCl today.  Recheck levels tomorrow.   Chronic thrombocytopenia. Platelets seem to be stable.      Subjective:  Patient denies any shortness of breath or cough today.  Poor appetite.  Physical Exam: Vitals:   01/06/23 0332 01/06/23 0801 01/06/23 1100 01/06/23 1147  BP: 98/65 99/67  109/73  Pulse: 72 62  65  Resp: 16 (!) 22 20 20   Temp: 98.3 F (36.8 C) 98 F (36.7 C)  97.9 F (36.6 C)  TempSrc:      SpO2: 98% 97%  99%  Weight:      Height:       General exam: Appears calm and comfortable  Respiratory system: Clear to auscultation. Respiratory effort normal. Cardiovascular system: S1 & S2 heard, RRR. No JVD, murmurs, rubs, gallops or clicks. No pedal edema. Gastrointestinal system: Abdomen is nondistended, soft and nontender. No organomegaly or masses felt. Normal bowel sounds heard. Central nervous system: Alert and oriented. No focal neurological deficits. Extremities: Symmetric 5 x 5 power. Skin: No rashes, lesions or ulcers Psychiatry: Judgement and insight appear normal. Mood & affect appropriate.    Data Reviewed:  Lab results reviewed.  Family Communication: None  Disposition: Status is: Inpatient Remains inpatient appropriate because: Severity of disease, IV treatment, pending inpatient procedure.     Time spent: 35 minutes  Author: Marrion Coy, MD 01/06/2023 12:38  PM  For on call review [email or web address].

## 2023-01-06 NOTE — Progress Notes (Signed)
ANTICOAGULATION CONSULT NOTE  Pharmacy Consult for Heparin Indication: NSTEMI/mural thrombus/hx of DVT/aflutter  No Known Allergies  Patient Measurements: Height: 5\' 9"  (175.3 cm) Weight: 79.1 kg (174 lb 6.4 oz) IBW/kg (Calculated) : 70.7 Heparin Dosing Weight: 78 kg  Vital Signs: Temp: 98.2 F (36.8 C) (05/15 2323) Temp Source: Oral (05/15 1558) BP: 121/83 (05/15 2323) Pulse Rate: 75 (05/15 2323)  Labs: Recent Labs    01/03/23 0849 01/03/23 1032 01/03/23 1033 01/03/23 1354 01/03/23 1514 01/04/23 0041 01/04/23 0437 01/04/23 0910 01/04/23 2050 01/05/23 0530 01/05/23 1447 01/05/23 2309  HGB   < >  --  14.9  --   --   --  12.0*  --   --  11.4*  --   --   HCT  --   --  42.6  --   --   --  34.5*  --   --  31.3*  --   --   PLT  --   --  129*  --   --   --  96*  --   --  96*  --   --   APTT  --   --   --   --  24  --   --   --   --   --   --   --   LABPROT  --   --   --   --  15.9*  --   --   --   --   --   --   --   INR  --   --   --   --  1.3*  --   --   --   --   --   --   --   HEPARINUNFRC  --   --   --   --  <0.10*   < >  --  0.27*   < > 0.74* 0.61 0.63  CREATININE  --   --  0.92  --   --   --  0.64  --   --  0.61  --   --   TROPONINIHS  --  2,925*  --  3,672*  --   --   --  2,721*  --   --   --   --    < > = values in this interval not displayed.     Estimated Creatinine Clearance: 83.5 mL/min (by C-G formula based on SCr of 0.61 mg/dL).  Assessment: 52 yoM w/ hx CAD s/p PCI and recently off rivaroxiban for hx of DVT, admitted with syncopal episode, new onset Afib and elevated troponins. Pharmacy has been consulted to assist with systemic heparin therapy. Hgb and plt are trending down. Per patient account, has been off xarelto for several weeks due to difficulty swallowing [facility]. Pt is on asa 81. Echo showed possible mural thrombus. New onset aflutter (CHADSVASc 4)  Goal of Therapy:  Heparin level 0.3-0.7 units/ml Monitor platelets by  anticoagulation protocol: Yes  Plan: heparin level is therapeutic x 2 ---continue heparin infusion at 1100 units/hr.  ---recheck heparin level in daily w/ AM labs while therapeutic ---Continue to monitor CBC daily while on IV heparin   Otelia Sergeant, PharmD, [facility] 01/06/2023 12:04 AM

## 2023-01-06 NOTE — Progress Notes (Signed)
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 Reports 40 pound weight loss over the past year PEG tube scheduled May 20, early next week Discussed echocardiogram showing LV thrombus, ejection fraction 35 to 40% Plan for cardiac catheterization following PEG tube placement Telemetry reviewed, maintaining normal sinus rhythm   Inpatient Medications    Scheduled Meds:  aspirin EC  81 mg Oral Daily   atorvastatin  40 mg Oral Daily   feeding supplement  237 mL Oral TID BM   metoprolol tartrate  25 mg Oral BID   Continuous Infusions:  [START ON 01/10/2023]  ceFAZolin (ANCEF) IV     heparin 1,100 Units/hr (01/06/23 0810)   potassium chloride 10 mEq (01/06/23 1123)   PRN Meds: ondansetron **OR** ondansetron (ZOFRAN) IV, senna-docusate   Vital Signs    Vitals:   01/06/23 0332 01/06/23 0801 01/06/23 1100 01/06/23 1147  BP: 98/65 99/67  109/73  Pulse: 72 62  65  Resp: 16 (!) 22 20 20   Temp: 98.3 F (36.8 C) 98 F (36.7 C)  97.9 F (36.6 C)  TempSrc:      SpO2: 98% 97%  99%  Weight:      Height:        Intake/Output Summary (Last 24 hours) at 01/06/2023 1314 Last data filed at 01/06/2023 1031 Gross per 24 hour  Intake 1153.58 ml  Output --  Net 1153.58 ml      01/05/2023    5:11 AM 01/03/2023   10:30 AM 01/03/2023    9:20 AM  Last 3 Weights  Weight (lbs) 174 lb 6.4 oz 171 lb 15.3 oz 172 lb 9.6 oz  Weight (kg) 79.107 kg 78 kg 78.291 kg      Telemetry    Normal sinus rhythm- Personally Reviewed  ECG     - Personally Reviewed  Physical Exam   GEN: No acute distress.   Neck: No JVD Cardiac: RRR, no murmurs, rubs, or gallops.  Respiratory: Clear to auscultation bilaterally. GI: Soft, nontender, non-distended  MS: No edema; No deformity. Neuro:  Nonfocal  Psych: Normal affect   Labs    High Sensitivity  Troponin:   Recent Labs  Lab 01/03/23 1032 01/03/23 1354 01/04/23 0910  TROPONINIHS 2,925* 3,672* 2,721*     Chemistry Recent Labs  Lab 12/31/22 0915 01/03/23 0849 01/03/23 0850 01/03/23 1032 01/03/23 1033 01/04/23 0437 01/05/23 0530 01/06/23 0519  NA 132*  --  137  --    < > 137 138 136  K 3.8  --  3.7  --    < > 3.2* 3.0* 3.0*  CL 96*  --  98  --    < > 104 104 100  CO2 18*  --  22  --    < > 22 22 22   GLUCOSE 159*  --  172*  --    < > 114* 98 103*  BUN 24*  --  21  --    < > 22 14 13   CREATININE 1.00  --  0.87  --    < > 0.64 0.61 0.67  CALCIUM 8.8*  --  8.9  --    < > 7.9* 8.0* 8.0*  MG  --    < >  --  2.1  --   --  1.6* 1.8  PROT 6.8  --  6.7  --   --   --   --   --  ALBUMIN 3.5  --  3.4*  --   --   --   --   --   AST 38  --  24  --   --   --   --   --   ALT 26  --  19  --   --   --   --   --   ALKPHOS 65  --  68  --   --   --   --   --   BILITOT 1.5*  --  1.4*  --   --   --   --   --   GFRNONAA >60  --  >60  --    < > >60 >60 >60  ANIONGAP 18*  --  17*  --    < > 11 12 14    < > = values in this interval not displayed.    Lipids  Recent Labs  Lab 01/05/23 0530  CHOL 138  TRIG 84  HDL 29*  LDLCALC 92  CHOLHDL 4.8    Hematology Recent Labs  Lab 01/04/23 0437 01/05/23 0530 01/06/23 0519  WBC 5.6 4.5 3.4*  RBC 3.73* 3.51* 3.36*  HGB 12.0* 11.4* 10.8*  HCT 34.5* 31.3* 30.2*  MCV 92.5 89.2 89.9  MCH 32.2 32.5 32.1  MCHC 34.8 36.4* 35.8  RDW 12.2 12.3 12.1  PLT 96* 96* 93*   Thyroid No results for input(s): "TSH", "FREET4" in the last 168 hours.  BNPNo results for input(s): "BNP", "PROBNP" in the last 168 hours.  DDimer No results for input(s): "DDIMER" in the last 168 hours.   Radiology    No results found.  Cardiac Studies  Echo Jan 03, 2023  1. There is a 3.3 x 1.7 cm mural thrombus near the left ventricular apex.  Left ventricular ejection fraction, by estimation, is 35 to 40%. The left  ventricle has moderately decreased function. The  left ventricle  demonstrates regional wall motion  abnormalities (see scoring diagram/findings for description). Left  ventricular diastolic parameters are consistent with Grade I diastolic  dysfunction (impaired relaxation). There is akinesis of the left  ventricular, mid-apical anterior wall and  anteroseptal wall. There is akinesis of the left ventricular, apical  inferior segment. There is akinesis of the left ventricular, apical  segment.   2. Right ventricular systolic function is normal. The right ventricular  size is mildly enlarged. There is normal pulmonary artery systolic  pressure.   3. The mitral valve is abnormal. Trivial mitral valve regurgitation. No  evidence of mitral stenosis.   4. The aortic valve has an indeterminant number of cusps. There is  moderate thickening of the aortic valve. Aortic valve regurgitation is  trivial. Aortic valve sclerosis/calcification is present, without any  evidence of aortic stenosis.   Patient Profile     72 y.o. 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   Assessment & Plan    New onset atrial flutter Converting to normal sinus rhythm this admission Tolerating metoprolol tartrate 25 twice daily On heparin infusion, with plan to transition to NOAC at discharge  Cardiomyopathy, dilated Ejection fraction 35 to 40%, Has poor p.o. 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

## 2023-01-06 NOTE — Progress Notes (Signed)
ANTICOAGULATION CONSULT NOTE  Pharmacy Consult for Heparin Indication: NSTEMI/mural thrombus/hx of DVT/aflutter  No Known Allergies  Patient Measurements: Height: 5\' 9"  (175.3 cm) Weight: 79.1 kg (174 lb 6.4 oz) IBW/kg (Calculated) : 70.7 Heparin Dosing Weight: 78 kg  Vital Signs: Temp: 98.3 F (36.8 C) (05/16 0332) BP: 98/65 (05/16 0332) Pulse Rate: 72 (05/16 0332)  Labs: Recent Labs    01/03/23 1032 01/03/23 1033 01/03/23 1354 01/03/23 1514 01/04/23 0041 01/04/23 0437 01/04/23 0910 01/04/23 2050 01/05/23 0530 01/05/23 1447 01/05/23 2309 01/06/23 0519  HGB  --    < >  --   --   --  12.0*  --   --  11.4*  --   --  10.8*  HCT  --    < >  --   --   --  34.5*  --   --  31.3*  --   --  30.2*  PLT  --    < >  --   --   --  96*  --   --  96*  --   --  93*  APTT  --   --   --  24  --   --   --   --   --   --   --   --   LABPROT  --   --   --  15.9*  --   --   --   --   --   --   --   --   INR  --   --   --  1.3*  --   --   --   --   --   --   --   --   HEPARINUNFRC  --   --   --  <0.10*   < >  --  0.27*   < > 0.74* 0.61 0.63 0.52  CREATININE  --    < >  --   --   --  0.64  --   --  0.61  --   --  0.67  TROPONINIHS 2,925*  --  3,672*  --   --   --  2,721*  --   --   --   --   --    < > = values in this interval not displayed.     Estimated Creatinine Clearance: 83.5 mL/min (by C-G formula based on SCr of 0.67 mg/dL).  Assessment: 73 yoM w/ hx CAD s/p PCI and recently off rivaroxiban for hx of DVT, admitted with syncopal episode, new onset Afib and elevated troponins. Pharmacy has been consulted to assist with systemic heparin therapy. Hgb and plt are trending down. Per patient account, has been off xarelto for several weeks due to difficulty swallowing [facility]. Pt is on asa 81. Echo showed possible mural thrombus. New onset aflutter (CHADSVASc 4)  Goal of Therapy:  Heparin level 0.3-0.7 units/ml Monitor platelets by anticoagulation protocol:  Yes  Plan: heparin level is therapeutic x 3 ---continue heparin infusion at 1100 units/hr.  ---recheck heparin level daily w/ AM labs while therapeutic ---Continue to monitor CBC daily while on IV heparin   Otelia Sergeant, PharmD, [facility] 01/06/2023 6:25 AM

## 2023-01-06 NOTE — Care Management Important Message (Signed)
Important Message  Patient Details  Name: Kenneth Gilmore MRN: [id number] Date of Birth: November 04, 1950   Medicare Important Message Given:  Yes     [name] 01/06/2023, 1:37 PM

## 2023-01-06 NOTE — Progress Notes (Signed)
  Transition of Care [facility]) Screening Note   Patient Details  Name: Kenneth Gilmore Date of Birth: July 20, 1951   Transition of Care [facility]) CM/SW Contact:    Truddie Hidden, RN Phone Number: 01/06/2023, 3:00 PM    Transition of Care Department [facility]) has reviewed patient and no TOC needs have been identified at this time. We will continue to monitor patient advancement through interdisciplinary progression rounds. If new patient transition needs arise, please place a TOC consult.

## 2023-01-06 NOTE — Progress Notes (Signed)
Radiation Oncology Follow up Note  Name: Kenneth Gilmore   Date:   01/03/2023 MRN:  [id number] DOB: 08/06/1951    This 72 y.o. male seen today in his hospital room had been admitted for high troponin of 2925.  Initially telemetry showed atrial flutter.  That was converted to sinus.  He is also scheduled for PEG tube placement on Monday for significant oral mucositis secondary to his treatments for stage IVa squamous cell carcinoma the oropharynx. REFERRING PROVIDER: No ref. provider found  HPI: As above patient seen in his hospital bed.  He states his swallowing is slightly improved although mouth is considerably still sore..  COMPLICATIONS OF TREATMENT: none  FOLLOW UP COMPLIANCE: keeps appointments   PHYSICAL EXAM:  BP 99/67 (BP Location: Right Arm)   Pulse 62   Temp 98 F (36.7 C)   Resp 20   Ht 5\' 9"  (1.753 m)   Wt 174 lb 6.4 oz (79.1 kg)   SpO2 97%   BMI 25.75 kg/m  Oral cavity shows moderate oral mucositis.  Difficult to examine based on trismus.  Skin of the neck is fine.  RADIOLOGY RESULTS: No current films for review  PLAN: At this time we will hold radiation therapy the rest of this week and try to resume on Monday.  Will try to work around his PEG placement.  Possibly on Tuesday he may have a catheterization.  Patient comprehends my recommendations well.  Should he be discharged he will be treated from home.    Carmina Miller, MD

## 2023-01-07 ENCOUNTER — Inpatient Hospital Stay: Payer: Medicare Other

## 2023-01-07 ENCOUNTER — Ambulatory Visit: Payer: Medicare Other

## 2023-01-07 ENCOUNTER — Inpatient Hospital Stay: Payer: Medicare Other | Admitting: Radiology

## 2023-01-07 ENCOUNTER — Other Ambulatory Visit: Payer: Self-pay

## 2023-01-07 DIAGNOSIS — E43 Unspecified severe protein-calorie malnutrition: Secondary | ICD-10-CM | POA: Diagnosis not present

## 2023-01-07 DIAGNOSIS — I5022 Chronic systolic (congestive) heart failure: Secondary | ICD-10-CM | POA: Diagnosis not present

## 2023-01-07 DIAGNOSIS — C109 Malignant neoplasm of oropharynx, unspecified: Secondary | ICD-10-CM | POA: Diagnosis not present

## 2023-01-07 DIAGNOSIS — D61818 Other pancytopenia: Secondary | ICD-10-CM | POA: Insufficient documentation

## 2023-01-07 DIAGNOSIS — E639 Nutritional deficiency, unspecified: Secondary | ICD-10-CM | POA: Diagnosis not present

## 2023-01-07 DIAGNOSIS — I4892 Unspecified atrial flutter: Secondary | ICD-10-CM | POA: Diagnosis not present

## 2023-01-07 DIAGNOSIS — I214 Non-ST elevation (NSTEMI) myocardial infarction: Secondary | ICD-10-CM | POA: Diagnosis not present

## 2023-01-07 HISTORY — PX: IR GASTROSTOMY TUBE MOD SED: IMG625

## 2023-01-07 LAB — CBC
HCT: 31.3 % — ABNORMAL LOW (ref 39.0–52.0)
Hemoglobin: 11.2 g/dL — ABNORMAL LOW (ref 13.0–17.0)
MCH: 32.1 pg (ref 26.0–34.0)
MCHC: 35.8 g/dL (ref 30.0–36.0)
MCV: 89.7 fL (ref 80.0–100.0)
Platelets: 92 10*3/uL — ABNORMAL LOW (ref 150–400)
RBC: 3.49 MIL/uL — ABNORMAL LOW (ref 4.22–5.81)
RDW: 12.3 % (ref 11.5–15.5)
WBC: 3.2 10*3/uL — ABNORMAL LOW (ref 4.0–10.5)
nRBC: 0 % (ref 0.0–0.2)

## 2023-01-07 LAB — MAGNESIUM: Magnesium: 1.7 mg/dL (ref 1.7–2.4)

## 2023-01-07 LAB — BASIC METABOLIC PANEL
Anion gap: 11 (ref 5–15)
BUN: 10 mg/dL (ref 8–23)
CO2: 25 mmol/L (ref 22–32)
Calcium: 8.4 mg/dL — ABNORMAL LOW (ref 8.9–10.3)
Chloride: 103 mmol/L (ref 98–111)
Creatinine, Ser: 0.65 mg/dL (ref 0.61–1.24)
GFR, Estimated: >60 mL/min (ref >60)
Glucose, Bld: 103 mg/dL — ABNORMAL HIGH (ref 70–99)
Potassium: 3.3 mmol/L — ABNORMAL LOW (ref 3.5–5.1)
Sodium: 139 mmol/L (ref 135–145)

## 2023-01-07 LAB — GLUCOSE, CAPILLARY: Glucose-Capillary: 107 mg/dL — ABNORMAL HIGH (ref 70–99)

## 2023-01-07 LAB — HEPARIN LEVEL (UNFRACTIONATED): Heparin Unfractionated: 0.54 IU/mL (ref 0.30–0.70)

## 2023-01-07 LAB — PROTIME-INR
INR: 1.1 (ref 0.8–1.2)
Prothrombin Time: 14.4 seconds (ref 11.4–15.2)

## 2023-01-07 LAB — PHOSPHORUS: Phosphorus: 2.9 mg/dL (ref 2.5–4.6)

## 2023-01-07 MED ORDER — PROSOURCE TF20 ENFIT COMPATIBL EN LIQD: 60.0000 mL | Freq: Every day | ENTERAL | Status: DC

## 2023-01-07 MED ORDER — VITAL HIGH PROTEIN PO LIQD: 1000.0000 mL | ORAL | Status: DC

## 2023-01-07 MED ORDER — MIDAZOLAM HCL 2 MG/2ML IJ SOLN
INTRAMUSCULAR | Status: AC | PRN
  Administered 2023-01-07: 1 mg via INTRAVENOUS

## 2023-01-07 MED ORDER — ACETAMINOPHEN 10 MG/ML IV SOLN
1000.0000 mg | Freq: Once | INTRAVENOUS | Status: AC
  Administered 2023-01-07: 1000 mg via INTRAVENOUS
  Filled 2023-01-07: qty 100

## 2023-01-07 MED ORDER — FENTANYL CITRATE (PF) 100 MCG/2ML IJ SOLN
INTRAMUSCULAR | Status: AC | PRN
  Administered 2023-01-07: 50 ug via INTRAVENOUS

## 2023-01-07 MED ORDER — EMPAGLIFLOZIN 10 MG PO TABS
10.0000 mg | ORAL_TABLET | Freq: Every day | ORAL | Status: DC
  Administered 2023-01-08 – 2023-01-11 (×4): 10 mg via ORAL
  Filled 2023-01-07 (×5): qty 1

## 2023-01-07 MED ORDER — POTASSIUM CHLORIDE 10 MEQ/100ML IV SOLN
10.0000 meq | INTRAVENOUS | Status: AC
  Administered 2023-01-07 (×3): 10 meq via INTRAVENOUS
  Filled 2023-01-07 (×4): qty 100

## 2023-01-07 MED ORDER — MIDAZOLAM HCL 5 MG/5ML IJ SOLN
INTRAMUSCULAR | Status: AC | PRN
  Administered 2023-01-07: 1 mg via INTRAVENOUS

## 2023-01-07 MED ORDER — FREE WATER
140.0000 mL | Status: AC
  Administered 2023-01-08 – 2023-01-10 (×11): 140 mL

## 2023-01-07 MED ORDER — MIDAZOLAM HCL 2 MG/2ML IJ SOLN
INTRAMUSCULAR | Status: AC
  Filled 2023-01-07: qty 2

## 2023-01-07 MED ORDER — METOPROLOL SUCCINATE ER 25 MG PO TB24
25.0000 mg | ORAL_TABLET | Freq: Every day | ORAL | Status: DC
  Administered 2023-01-08 – 2023-01-10 (×3): 25 mg via ORAL
  Filled 2023-01-07 (×3): qty 1

## 2023-01-07 MED ORDER — CEFAZOLIN SODIUM-DEXTROSE 2-4 GM/100ML-% IV SOLN
INTRAVENOUS | Status: AC
  Filled 2023-01-07: qty 100

## 2023-01-07 MED ORDER — LIDOCAINE HCL 1 % IJ SOLN
INTRAMUSCULAR | Status: AC
  Filled 2023-01-07: qty 20

## 2023-01-07 MED ORDER — PROSOURCE TF20 ENFIT COMPATIBL EN LIQD
60.0000 mL | Freq: Two times a day (BID) | ENTERAL | Status: DC
  Administered 2023-01-08 – 2023-01-11 (×4): 60 mL
  Filled 2023-01-07 (×3): qty 60

## 2023-01-07 MED ORDER — THIAMINE MONONITRATE 100 MG PO TABS
100.0000 mg | ORAL_TABLET | Freq: Every day | ORAL | Status: DC
  Administered 2023-01-08 – 2023-01-11 (×4): 100 mg
  Filled 2023-01-07 (×4): qty 1

## 2023-01-07 MED ORDER — LIDOCAINE VISCOUS HCL 2 % MT SOLN
OROMUCOSAL | Status: AC
  Filled 2023-01-07: qty 15

## 2023-01-07 MED ORDER — FENTANYL CITRATE (PF) 100 MCG/2ML IJ SOLN
INTRAMUSCULAR | Status: AC
  Filled 2023-01-07: qty 2

## 2023-01-07 MED ORDER — OSMOLITE 1.5 CAL PO LIQD
1000.0000 mL | ORAL | Status: AC
  Administered 2023-01-08 – 2023-01-09 (×2): 1000 mL

## 2023-01-07 MED ORDER — HEPARIN (PORCINE) 25000 UT/250ML-% IV SOLN
1100.0000 [IU]/h | INTRAVENOUS | Status: DC
  Administered 2023-01-07 – 2023-01-09 (×3): 1100 [IU]/h via INTRAVENOUS
  Filled 2023-01-07 (×3): qty 250

## 2023-01-07 MED ORDER — LIDOCAINE HCL 1 % IJ SOLN
10.0000 mL | Freq: Once | INTRAMUSCULAR | Status: AC
  Administered 2023-01-07: 10 mL via INTRADERMAL

## 2023-01-07 NOTE — Plan of Care (Signed)

## 2023-01-07 NOTE — Progress Notes (Addendum)
Rounding Note    Patient Name: Kenneth Gilmore Date of Encounter: 01/07/2023  [facility] Cardiologist: new to [facility]  Subjective   PEG tube placed this afternoon for dysphagia in the setting of oropharyngeal cancer, no complications  Plan to restart heparin later this afternoon echocardiogram showing LV thrombus, ejection fraction 35 to 40%  Maintaining normal sinus rhythm  Inpatient Medications    Scheduled Meds:  aspirin EC  81 mg Oral Daily   atorvastatin  40 mg Oral Daily   [START ON 01/08/2023] feeding supplement (PROSource TF20)  60 mL Per Tube BID   [START ON 01/08/2023] free water  140 mL Per Tube Q4H   metoprolol tartrate  25 mg Oral BID   [START ON 01/08/2023] thiamine  100 mg Per Tube Daily   Continuous Infusions:   ceFAZolin (ANCEF) IV     [START ON 01/08/2023] feeding supplement (OSMOLITE 1.5 CAL)     PRN Meds: ondansetron **OR** ondansetron (ZOFRAN) IV, senna-docusate   Vital Signs    Vitals:   01/07/23 1356 01/07/23 1400 01/07/23 1405 01/07/23 1431  BP: 101/69 113/71 106/88 107/63  Pulse: 66 71 77 64  Resp: 14 17 16 14   Temp:      TempSrc:      SpO2: 100% 100% 90% 96%  Weight:      Height:        Intake/Output Summary (Last 24 hours) at 01/07/2023 1448 Last data filed at 01/07/2023 0603 Gross per 24 hour  Intake 929.42 ml  Output --  Net 929.42 ml      01/07/2023    1:09 PM 01/05/2023    5:11 AM 01/03/2023   10:30 AM  Last 3 Weights  Weight (lbs) 174 lb 6.1 oz 174 lb 6.4 oz 171 lb 15.3 oz  Weight (kg) 79.1 kg 79.107 kg 78 kg      Telemetry    Normal sinus rhythm- Personally Reviewed  ECG     - Personally Reviewed  Physical Exam   Constitutional:  oriented to person, place, and time. No distress.  HENT:  Head: Grossly normal Eyes:  no discharge. No scleral icterus.  Neck: No JVD, no carotid bruits  Cardiovascular: Regular rate and rhythm, no murmurs appreciated Pulmonary/Chest: Clear to auscultation bilaterally,  no wheezes or rails Abdominal: Soft.  no distension.  no tenderness.  Musculoskeletal: Normal range of motion Neurological:  normal muscle tone. Coordination normal. No atrophy Skin: Skin warm and dry Psychiatric: normal affect, pleasant  Labs    High Sensitivity Troponin:   Recent Labs  Lab 01/03/23 1032 01/03/23 1354 01/04/23 0910  TROPONINIHS 2,925* 3,672* 2,721*     Chemistry Recent Labs  Lab 01/03/23 0850 01/03/23 1032 01/05/23 0530 01/06/23 0519 01/07/23 0535  NA 137   < > 138 136 139  K 3.7   < > 3.0* 3.0* 3.3*  CL 98   < > 104 100 103  CO2 22   < > 22 22 25   GLUCOSE 172*   < > 98 103* 103*  BUN 21   < > 14 13 10   CREATININE 0.87   < > 0.61 0.67 0.65  CALCIUM 8.9   < > 8.0* 8.0* 8.4*  MG  --    < > 1.6* 1.8 1.7  PROT 6.7  --   --   --   --   ALBUMIN 3.4*  --   --   --   --   AST 24  --   --   --   --  ALT 19  --   --   --   --   ALKPHOS 68  --   --   --   --   BILITOT 1.4*  --   --   --   --   GFRNONAA >60   < > >60 >60 >60  ANIONGAP 17*   < > 12 14 11    < > = values in this interval not displayed.    Lipids  Recent Labs  Lab 01/05/23 0530  CHOL 138  TRIG 84  HDL 29*  LDLCALC 92  CHOLHDL 4.8    Hematology Recent Labs  Lab 01/05/23 0530 01/06/23 0519 01/07/23 0535  WBC 4.5 3.4* 3.2*  RBC 3.51* 3.36* 3.49*  HGB 11.4* 10.8* 11.2*  HCT 31.3* 30.2* 31.3*  MCV 89.2 89.9 89.7  MCH 32.5 32.1 32.1  MCHC 36.4* 35.8 35.8  RDW 12.3 12.1 12.3  PLT 96* 93* 92*   Thyroid No results for input(s): "TSH", "FREET4" in the last 168 hours.  BNPNo results for input(s): "BNP", "PROBNP" in the last 168 hours.  DDimer No results for input(s): "DDIMER" in the last 168 hours.   Radiology    IR GASTROSTOMY TUBE MOD SED  Result Date: 01/07/2023 INDICATION: 72 year old male with a history of dysphagia EXAM: PERC PLACEMENT GASTROSTOMY MEDICATIONS: 2 g Ancef; Antibiotics were administered within 1 hour of the procedure. ANESTHESIA/SEDATION: Versed 2.0 mg IV;  Fentanyl 50 mcg IV Moderate Sedation Time:  12 minutes The patient was continuously monitored during the procedure by the interventional radiology nurse under my direct supervision. CONTRAST:  10 cc-administered into the gastric lumen. FLUOROSCOPY: Radiation Exposure Index (as provided by the fluoroscopic device): 13 mGy Kerma COMPLICATIONS: None PROCEDURE: Informed written consent was obtained from the patient and the patient's family after a thorough discussion of the procedural risks, benefits and alternatives. All questions were addressed. Maximal Sterile Barrier Technique was utilized including caps, mask, sterile gowns, sterile gloves, sterile drape, hand hygiene and skin antiseptic. A timeout was performed prior to the initiation of the procedure. The epigastrium was prepped with Betadine in a sterile fashion, and a sterile drape was applied covering the operative field. A sterile gown and sterile gloves were used for the procedure. A 5-French orogastric tube is placed under fluoroscopic guidance. Scout imaging of the abdomen confirms barium within the transverse colon. The stomach was distended with gas. Under fluoroscopic guidance, an 18 gauge needle was utilized to puncture the anterior wall of the body of the stomach. An Amplatz wire was advanced through the needle passing a T fastener into the lumen of the stomach. The T fastener was secured for gastropexy. A 9-French sheath was inserted. A snare was advanced through the 9-French sheath. A Teena Dunk was advanced through the orogastric tube. It was snared then pulled out the oral cavity, pulling the snare, as well. The leading edge of the gastrostomy was attached to the snare. It was then pulled down the esophagus and out the percutaneous site. Tube secured in place. Contrast was injected. Patient tolerated the procedure well and remained hemodynamically stable throughout. No complications were encountered and no significant blood loss encountered.  IMPRESSION: Status post fluoroscopic placed percutaneous gastrostomy tube, with 20 [place] pull-through. Signed, Yvone Neu. Loreta Ave, DO [facility] Radiology Electronically Signed   By: Gilmer Mor D.O.   On: 01/07/2023 14:13    Cardiac Studies  Echo Jan 03, 2023  1. There is a 3.3 x 1.7 cm mural thrombus  near the left ventricular apex.  Left ventricular ejection fraction, by estimation, is 35 to 40%. The left  ventricle has moderately decreased function. The left ventricle  demonstrates regional wall motion  abnormalities (see scoring diagram/findings for description). Left  ventricular diastolic parameters are consistent with Grade I diastolic  dysfunction (impaired relaxation). There is akinesis of the left  ventricular, mid-apical anterior wall and  anteroseptal wall. There is akinesis of the left ventricular, apical  inferior segment. There is akinesis of the left ventricular, apical  segment.   2. Right ventricular systolic function is normal. The right ventricular  size is mildly enlarged. There is normal pulmonary artery systolic  pressure.   3. The mitral valve is abnormal. Trivial mitral valve regurgitation. No  evidence of mitral stenosis.   4. The aortic valve has an indeterminant number of cusps. There is  moderate thickening of the aortic valve. Aortic valve regurgitation is  trivial. Aortic valve sclerosis/calcification is present, without any  evidence of aortic stenosis.   Patient Profile     72 y.o. 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   Assessment & Plan    New onset atrial  flutter Converting to normal sinus rhythm this admission Tolerating metoprolol tartrate 25 twice daily with plans to transition to metoprolol succinate On heparin infusion, with plan to transition to NOAC at discharge  Cardiomyopathy, dilated Ejection fraction 35 to 40%, Off diltiazem, transitioned to metoprolol to tartrate 25 twice daily Given non-STEMI with troponin greater than 3000, plan for cardiac catheterization on Monday, May 20 History of prior stent to the LAD and OM 2 in 2003 = Transitioning to metoprolol succinate -Start SGLT2 inhibitor, additional goal-directed medical therapy limited secondary to hypotension  Non-STEMI Peak troponin 3600 On heparin infusion with plan for left heart catheterization   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

## 2023-01-07 NOTE — Progress Notes (Signed)
Progress Note   Patient: Kenneth Gilmore:[id number] DOB: [birth year] DOA: 01/03/2023     4 DOS: the patient was seen and examined on 01/07/2023   Brief hospital course: Mr. Kenneth Gilmore is a 72 year old male with non-insulin-dependent diabetes mellitus, hyperlipidemia, history of DVT on Xarelto, oropharyngeal squamous cell carcinoma stage IVa, who presents emergency department from oncology clinic for chief concerns of syncope event at home while he was in the shower. High sensitive troponin is 2925.  Telemetry showed atrial flutter, he was initially given diltiazem gtt., and quickly converted to sinus. Patient also had a significant dysphagia from oropharyngeal cancer, PEG tube is scheduled. 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    Principal Problem:   NSTEMI (non-ST elevated myocardial infarction) (HCC) Active Problems:   Oropharyngeal cancer (HCC)   History of DVT (deep vein thrombosis)   Neoplasm related pain   Dysphagia   Diabetes mellitus type 2, noninsulin dependent (HCC)   Protein-calorie malnutrition, severe (HCC)   Benign essential tremor   Atrial flutter (HCC)   Hypotension   Syncope and collapse   Palliative care encounter   Hypomagnesemia   Hypokalemia   LV (left ventricular) mural thrombus   Chronic systolic CHF (congestive heart failure) (HCC)   Thrombocythemia   Poor nutrition   Dilated cardiomyopathy (HCC)   Pancytopenia (HCC)   Assessment and Plan: New onset atrial flutter. Chronic systolic congestive heart failure. Elevated troponin secondary to atrial flutter and congestive heart failure. LV thrombus. Patient so far has converted to sinus rhythm, currently does not seem to have any volume overload.  Patient has been evaluated by cardiology, on heparin drip.   Cardiology has a planning for workup after PEG tube placement.   Syncope and collapse. Most likely secondary to atrial flutter and  drop of blood pressure.  Continue telemetry monitoring.   stage IVa oropharyngeal squamous cell carcinoma on on concurrent chemoradiation  Dysphagia secondary to oropharyngeal cancer. Severe protein calorie malnutrition secondary dysphagia. Discussed with interventional radiologist, PEG tube will be placed today. Will obtain dietitian consult to start tube feeding.   Type 2 diabetes Glucose not significant elevated   Hypokalemia Hypomagnesemia. Additional IV potassium be given today.   Chronic thrombocytopenia. Pancytopenia. Condition appears to be stable.        Subjective:  Patient has no complaint today.  Physical Exam: Vitals:   01/06/23 2006 01/06/23 2343 01/07/23 0331 01/07/23 0750  BP: 109/74 119/71 115/77 (!) 100/47  Pulse: 68 68 75 83  Resp: 18 18 18 18   Temp: 98.2 F (36.8 C) 98.4 F (36.9 C) 98.7 F (37.1 C) 97.9 F (36.6 C)  TempSrc:      SpO2: 99% 97% 99% 99%  Weight:      Height:       General exam: Appears calm and comfortable  Respiratory system: Clear to auscultation. Respiratory effort normal. Cardiovascular system: S1 & S2 heard, RRR. No JVD, murmurs, rubs, gallops or clicks. No pedal edema. Gastrointestinal system: Abdomen is nondistended, soft and nontender. No organomegaly or masses felt. Normal bowel sounds heard. Central nervous system: Alert and oriented. No focal neurological deficits. Extremities: Symmetric 5 x 5 power. Skin: No rashes, lesions or ulcers Psychiatry: Judgement and insight appear normal. Mood & affect appropriate.    Data Reviewed:  Lab results reviewed.  Family Communication: None  Disposition: Status is: Inpatient Remains inpatient appropriate because: Severity of disease, IV treatment.  Inpatient procedure.     Time  spent: 35 minutes  Author: Marrion Coy, MD 01/07/2023 10:48 AM  For on call review [email or web address].

## 2023-01-07 NOTE — Procedures (Signed)
Interventional Radiology Procedure Note  Procedure: Placement of percutaneous 31F pull-through gastrostomy tube. Complications: None Recommendations: - NPO except for sips and chips x 4 hours. - may start using g-tube in 4 hours - OK to restart heparin ggt in 1 hr from now  - Ice pack prn, or other analgesics  Signed,   Yvone Neu. Loreta Ave, DO

## 2023-01-07 NOTE — Progress Notes (Signed)
Patient clinically stable post 20 FR PEG tube placement per Dr Loreta Ave, tolerated well. Vitals stable pre and post procedure. Received Versed 2 mg along with Fentanyl 50 mcg IV for procedure.report given to room 245 RN with questions answered. To restart heparin gtt at 1530, may use PEG tube at 1815.

## 2023-01-07 NOTE — Progress Notes (Addendum)
Nutrition Follow-up  DOCUMENTATION CODES:   Severe malnutrition in context of chronic illness  INTERVENTION:   -Once PEG is placed, recommend:    Initiate Osmolite 1.5 @ 20 ml/hr and increase by 10 ml every 12 hours to goal rate of 65 ml/hr.    60 ml Prosource TF BID.     If no IVFs, 140 ml free water flush every 4 hours   Tube feeding regimen provides 2500 kcal (100% of needs), 138 grams of protein, and 1189 ml of H2O. Total free water: 2029 ml daily   -Monitor Mg, K, and phos and replete as needed secondary to high refeeding risk -Recommend 100 mg thiamine daily x 5 days once feedings are started secondary to high refeeding risk  NUTRITION DIAGNOSIS:   Severe Malnutrition related to chronic illness (squamous cell carcinoma) as evidenced by percent weight loss, energy intake < or equal to 75% for > or equal to 1 month, severe fat depletion, moderate fat depletion, moderate muscle depletion, severe muscle depletion.  Ongoing  GOAL:   Patient will meet greater than or equal to 90% of their needs  Progressing   MONITOR:   PO intake, Other (Comment)  REASON FOR ASSESSMENT:   Consult Assessment of nutrition requirement/status, Enteral/tube feeding initiation and management  ASSESSMENT:   Pt with non-insulin-dependent diabetes mellitus, hyperlipidemia, history of DVT on Xarelto, oropharyngeal squamous cell carcinoma stage IVa, who presents for chief concerns of syncope event at home while he was in the shower.  Reviewed I/O's: +1.5 L x 24 hours and +4.2 L since admission   Case discussed with RN and MD. PEG to be placed today by IR. Received consult to start TF. Discussed need for [facility] consult for TF needs to transition home.   Medications reviewed and include potassium chloride.   Labs reviewed: K: 3.3 (on supplementation), CBGS: 115 (inpatient orders for glycemic control are none).    Diet Order:   Diet Order             Diet NPO time specified Except for: Sips  with Meds  Diet effective midnight                   EDUCATION NEEDS:   Education needs have been addressed  Skin:  Skin Assessment: Reviewed RN Assessment  Last BM:  Unknown  Height:   Ht Readings from Last 1 Encounters:  01/03/23 5\' 9"  (1.753 m)    Weight:   Wt Readings from Last 1 Encounters:  01/05/23 79.1 kg    Ideal Body Weight:  72.7 kg  BMI:  Body mass index is 25.75 kg/m.  Estimated Nutritional Needs:   Kcal:  1610-9604  Protein:  130-145 grams  Fluid:  > 2 L    Levada Schilling, RD, LDN, CDCES Registered Dietitian II Certified Diabetes Care and Education Specialist Please refer to [facility] for RD and/or RD on-call/weekend/after hours pager

## 2023-01-07 NOTE — Progress Notes (Signed)
ANTICOAGULATION CONSULT NOTE  Pharmacy Consult for Heparin Indication: NSTEMI/mural thrombus/hx of DVT/aflutter  No Known Allergies  Patient Measurements: Height: 5\' 9"  (175.3 cm) Weight: 79.1 kg (174 lb 6.4 oz) IBW/kg (Calculated) : 70.7 Heparin Dosing Weight: 78 kg  Vital Signs: Temp: 98.7 F (37.1 C) (05/17 0331) BP: 115/77 (05/17 0331) Pulse Rate: 75 (05/17 0331)  Labs: Recent Labs    01/04/23 0910 01/04/23 2050 01/05/23 0530 01/05/23 1447 01/05/23 2309 01/06/23 0519 01/07/23 0535  HGB  --    < > 11.4*  --   --  10.8* 11.2*  HCT  --   --  31.3*  --   --  30.2* 31.3*  PLT  --   --  96*  --   --  93* 92*  HEPARINUNFRC 0.27*   < > 0.74*   < > 0.63 0.52 0.54  CREATININE  --   --  0.61  --   --  0.67  --   TROPONINIHS 2,721*  --   --   --   --   --   --    < > = values in this interval not displayed.     Estimated Creatinine Clearance: 83.5 mL/min (by C-G formula based on SCr of 0.67 mg/dL).  Assessment: 9 yoM w/ hx CAD s/p PCI and recently off rivaroxiban for hx of DVT, admitted with syncopal episode, new onset Afib and elevated troponins. Pharmacy has been consulted to assist with systemic heparin therapy. Hgb and plt are trending down. Per patient account, has been off xarelto for several weeks due to difficulty swallowing and [facility]. Pt is on asa 81. Echo showed possible mural thrombus. New onset aflutter (CHADSVASc 4)  Goal of Therapy:  Heparin level 0.3-0.7 units/ml Monitor platelets by anticoagulation protocol: Yes  Plan: heparin level is therapeutic x 4 ---continue heparin infusion at 1100 units/hr.  ---recheck heparin level daily w/ AM labs while therapeutic ---Continue to monitor CBC daily while on IV heparin   Otelia Sergeant, PharmD, [facility] 01/07/2023 6:23 AM

## 2023-01-07 NOTE — Progress Notes (Signed)
ANTICOAGULATION CONSULT NOTE  Pharmacy Consult for Heparin Indication: NSTEMI/mural thrombus/hx of DVT/aflutter  No Known Allergies  Patient Measurements: Height: 5\' 9"  (175.3 cm) Weight: 79.1 kg (174 lb 6.1 oz) IBW/kg (Calculated) : 70.7 Heparin Dosing Weight: 78 kg  Vital Signs: Temp: 98.1 F (36.7 C) (05/17 1309) Temp Source: Oral (05/17 1309) BP: 99/63 (05/17 1445) Pulse Rate: 66 (05/17 1445)  Labs: Recent Labs    01/05/23 0530 01/05/23 1447 01/05/23 2309 01/06/23 0519 01/07/23 0535 01/07/23 0956  HGB 11.4*  --   --  10.8* 11.2*  --   HCT 31.3*  --   --  30.2* 31.3*  --   PLT 96*  --   --  93* 92*  --   LABPROT  --   --   --   --   --  14.4  INR  --   --   --   --   --  1.1  HEPARINUNFRC 0.74*   < > 0.63 0.52 0.54  --   CREATININE 0.61  --   --  0.67 0.65  --    < > = values in this interval not displayed.     Estimated Creatinine Clearance: 83.5 mL/min (by C-G formula based on SCr of 0.65 mg/dL).  Assessment: 70 yoM w/ hx CAD s/p PCI and recently off rivaroxiban for hx of DVT, admitted with syncopal episode, new onset Afib and elevated troponins. Pharmacy has been consulted to assist with systemic heparin therapy. Hgb and plt are trending down. Per patient account, has been off xarelto for several weeks due to difficulty swallowing and [facility]. Pt is on asa 81. Echo showed possible mural thrombus. New onset aflutter (CHADSVASc 4)  Goal of Therapy:  Heparin level 0.3-0.7 units/ml Monitor platelets by anticoagulation protocol: Yes  Plan:  Heparin held for PEG tube placement. Heparin to restart at 15:30 per RN. Check heparin level in 8 hours. CBC daily while on heparin.    Paschal Dopp, PharmD 01/07/2023 3:35 PM

## 2023-01-07 NOTE — Progress Notes (Signed)
       CROSS COVER NOTE  NAME: BRODYN ROYCE MRN: [id number] DOB : 29-May-1951    HPI/Events of Note   Report:mild pain at newly placed peg site  On review of chart:    Assessment and  Interventions   Assessment:  Plan: 1000 mg acetaminophen ordered       Donnie Mesa NP [facility] Cross Cover 7pm-7am - check amion for availability Pager [phone]

## 2023-01-08 DIAGNOSIS — I214 Non-ST elevation (NSTEMI) myocardial infarction: Secondary | ICD-10-CM | POA: Diagnosis not present

## 2023-01-08 DIAGNOSIS — D61818 Other pancytopenia: Secondary | ICD-10-CM | POA: Diagnosis not present

## 2023-01-08 DIAGNOSIS — I513 Intracardiac thrombosis, not elsewhere classified: Secondary | ICD-10-CM

## 2023-01-08 DIAGNOSIS — I4892 Unspecified atrial flutter: Secondary | ICD-10-CM | POA: Diagnosis not present

## 2023-01-08 DIAGNOSIS — I5022 Chronic systolic (congestive) heart failure: Secondary | ICD-10-CM | POA: Diagnosis not present

## 2023-01-08 DIAGNOSIS — C109 Malignant neoplasm of oropharynx, unspecified: Secondary | ICD-10-CM | POA: Diagnosis not present

## 2023-01-08 LAB — BASIC METABOLIC PANEL
Anion gap: 12 (ref 5–15)
BUN: 10 mg/dL (ref 8–23)
CO2: 23 mmol/L (ref 22–32)
Calcium: 8.2 mg/dL — ABNORMAL LOW (ref 8.9–10.3)
Chloride: 102 mmol/L (ref 98–111)
Creatinine, Ser: 0.7 mg/dL (ref 0.61–1.24)
GFR, Estimated: >60 mL/min (ref >60)
Glucose, Bld: 107 mg/dL — ABNORMAL HIGH (ref 70–99)
Potassium: 3.4 mmol/L — ABNORMAL LOW (ref 3.5–5.1)
Sodium: 137 mmol/L (ref 135–145)

## 2023-01-08 LAB — CBC
HCT: 29.1 % — ABNORMAL LOW (ref 39.0–52.0)
HCT: 29.5 % — ABNORMAL LOW (ref 39.0–52.0)
Hemoglobin: 10.4 g/dL — ABNORMAL LOW (ref 13.0–17.0)
Hemoglobin: 10.5 g/dL — ABNORMAL LOW (ref 13.0–17.0)
MCH: 31.8 pg (ref 26.0–34.0)
MCH: 32.1 pg (ref 26.0–34.0)
MCHC: 35.6 g/dL (ref 30.0–36.0)
MCHC: 35.7 g/dL (ref 30.0–36.0)
MCV: 89.4 fL (ref 80.0–100.0)
MCV: 89.8 fL (ref 80.0–100.0)
Platelets: 94 10*3/uL — ABNORMAL LOW (ref 150–400)
Platelets: 97 10*3/uL — ABNORMAL LOW (ref 150–400)
RBC: 3.24 MIL/uL — ABNORMAL LOW (ref 4.22–5.81)
RBC: 3.3 MIL/uL — ABNORMAL LOW (ref 4.22–5.81)
RDW: 12.4 % (ref 11.5–15.5)
RDW: 12.5 % (ref 11.5–15.5)
WBC: 5.1 10*3/uL (ref 4.0–10.5)
WBC: 5.4 10*3/uL (ref 4.0–10.5)
nRBC: 0 % (ref 0.0–0.2)
nRBC: 0 % (ref 0.0–0.2)

## 2023-01-08 LAB — GLUCOSE, CAPILLARY
Glucose-Capillary: 100 mg/dL — ABNORMAL HIGH (ref 70–99)
Glucose-Capillary: 105 mg/dL — ABNORMAL HIGH (ref 70–99)
Glucose-Capillary: 115 mg/dL — ABNORMAL HIGH (ref 70–99)
Glucose-Capillary: 116 mg/dL — ABNORMAL HIGH (ref 70–99)
Glucose-Capillary: 91 mg/dL (ref 70–99)
Glucose-Capillary: 99 mg/dL (ref 70–99)

## 2023-01-08 LAB — HEPARIN LEVEL (UNFRACTIONATED)
Heparin Unfractionated: 0.34 IU/mL (ref 0.30–0.70)
Heparin Unfractionated: 0.53 IU/mL (ref 0.30–0.70)

## 2023-01-08 LAB — MAGNESIUM: Magnesium: 1.5 mg/dL — ABNORMAL LOW (ref 1.7–2.4)

## 2023-01-08 LAB — PHOSPHORUS: Phosphorus: 3.4 mg/dL (ref 2.5–4.6)

## 2023-01-08 MED ORDER — LACTULOSE 10 GM/15ML PO SOLN
20.0000 g | Freq: Once | ORAL | Status: AC
  Administered 2023-01-08: 20 g
  Filled 2023-01-08: qty 30

## 2023-01-08 MED ORDER — POTASSIUM CHLORIDE 10 MEQ/100ML IV SOLN
10.0000 meq | Freq: Once | INTRAVENOUS | Status: AC
  Administered 2023-01-08: 10 meq via INTRAVENOUS
  Filled 2023-01-08: qty 100

## 2023-01-08 MED ORDER — OXYCODONE HCL 5 MG PO TABS
5.0000 mg | ORAL_TABLET | Freq: Four times a day (QID) | ORAL | Status: DC | PRN
  Administered 2023-01-08: 5 mg via ORAL
  Filled 2023-01-08: qty 1

## 2023-01-08 MED ORDER — MAGNESIUM SULFATE 4 GM/100ML IV SOLN
4.0000 g | Freq: Once | INTRAVENOUS | Status: AC
  Administered 2023-01-08: 4 g via INTRAVENOUS
  Filled 2023-01-08: qty 100

## 2023-01-08 MED ORDER — QUETIAPINE FUMARATE 25 MG PO TABS
25.0000 mg | ORAL_TABLET | Freq: Every day | ORAL | Status: DC
  Administered 2023-01-08 – 2023-01-09 (×2): 25 mg
  Filled 2023-01-08 (×2): qty 1

## 2023-01-08 MED ORDER — TRIPLE ANTIBIOTIC 3.5-400-5000 EX OINT
1.0000 | TOPICAL_OINTMENT | Freq: Every day | CUTANEOUS | Status: DC
  Administered 2023-01-08 – 2023-01-11 (×4): 1 via TOPICAL
  Filled 2023-01-08 (×4): qty 1

## 2023-01-08 MED ORDER — ATORVASTATIN CALCIUM 80 MG PO TABS
80.0000 mg | ORAL_TABLET | Freq: Every day | ORAL | Status: DC
  Administered 2023-01-08 – 2023-01-11 (×4): 80 mg via ORAL
  Filled 2023-01-08 (×3): qty 1

## 2023-01-08 MED ORDER — POTASSIUM CHLORIDE 20 MEQ PO PACK
40.0000 meq | PACK | Freq: Two times a day (BID) | ORAL | Status: AC
  Administered 2023-01-08 (×2): 40 meq
  Filled 2023-01-08 (×2): qty 2

## 2023-01-08 NOTE — Progress Notes (Signed)
Progress Note  Patient Name: Kenneth Gilmore Date of Encounter: 01/08/2023  Primary Cardiologist: New - consult by Kirke Corin  Subjective   No chest pain, dyspnea, palpitations, dizziness, presyncope, or syncope. Status post PEG tube placement 5/17.   Inpatient Medications    Scheduled Meds:  aspirin EC  81 mg Oral Daily   atorvastatin  40 mg Oral Daily   empagliflozin  10 mg Oral Daily   feeding supplement (PROSource TF20)  60 mL Per Tube BID   free water  140 mL Per Tube Q4H   metoprolol succinate  25 mg Oral Daily   potassium chloride  40 mEq Per Tube BID   QUEtiapine  25 mg Per Tube QHS   thiamine  100 mg Per Tube Daily   Continuous Infusions:   ceFAZolin (ANCEF) IV     feeding supplement (OSMOLITE 1.5 CAL)     heparin 1,100 Units/hr (01/08/23 0136)   magnesium sulfate bolus IVPB     potassium chloride 10 mEq (01/08/23 0907)   PRN Meds: ondansetron **OR** ondansetron (ZOFRAN) IV, oxyCODONE, senna-docusate   Vital Signs    Vitals:   01/08/23 0004 01/08/23 0414 01/08/23 0500 01/08/23 0825  BP: (!) 90/54 94/61  106/68  Pulse: 79 72  71  Resp: 18 19  20   Temp: 98.4 F (36.9 C) 98.1 F (36.7 C)  98.4 F (36.9 C)  TempSrc:    Oral  SpO2: 96% 98%  99%  Weight:   80.1 kg   Height:        Intake/Output Summary (Last 24 hours) at 01/08/2023 0929 Last data filed at 01/08/2023 0444 Gross per 24 hour  Intake 211.1 ml  Output 600 ml  Net -388.9 ml   Filed Weights   01/05/23 0511 01/07/23 1309 01/08/23 0500  Weight: 79.1 kg 79.1 kg 80.1 kg    Telemetry    SR with PACs and PVCs - Personally Reviewed  ECG    No new tracings - Personally Reviewed  Physical Exam   GEN: No acute distress.   Neck: No JVD. Cardiac: RRR, no murmurs, rubs, or gallops.  Respiratory: Clear to auscultation bilaterally.  GI: Soft, nontender, non-distended.   MS: No edema; No deformity. Neuro:  Alert and oriented x 3; Nonfocal.  Psych: Normal affect.  Labs    Chemistry Recent  Labs  Lab 01/03/23 0850 01/03/23 1033 01/06/23 0519 01/07/23 0535 01/08/23 0440  NA 137   < > 136 139 137  K 3.7   < > 3.0* 3.3* 3.4*  CL 98   < > 100 103 102  CO2 22   < > 22 25 23   GLUCOSE 172*   < > 103* 103* 107*  BUN 21   < > 13 10 10   CREATININE 0.87   < > 0.67 0.65 0.70  CALCIUM 8.9   < > 8.0* 8.4* 8.2*  PROT 6.7  --   --   --   --   ALBUMIN 3.4*  --   --   --   --   AST 24  --   --   --   --   ALT 19  --   --   --   --   ALKPHOS 68  --   --   --   --   BILITOT 1.4*  --   --   --   --   GFRNONAA >60   < > >60 >60 >60  ANIONGAP 17*   < >  14 11 12    < > = values in this interval not displayed.     Hematology Recent Labs  Lab 01/07/23 0535 01/08/23 0019 01/08/23 0440  WBC 3.2* 5.4 5.1  RBC 3.49* 3.24* 3.30*  HGB 11.2* 10.4* 10.5*  HCT 31.3* 29.1* 29.5*  MCV 89.7 89.8 89.4  MCH 32.1 32.1 31.8  MCHC 35.8 35.7 35.6  RDW 12.3 12.4 12.5  PLT 92* 97* 94*    Cardiac EnzymesNo results for input(s): "TROPONINI" in the last 168 hours. No results for input(s): "TROPIPOC" in the last 168 hours.   BNPNo results for input(s): "BNP", "PROBNP" in the last 168 hours.   DDimer No results for input(s): "DDIMER" in the last 168 hours.   Radiology    IR GASTROSTOMY TUBE MOD SED  Result Date: 01/07/2023 IMPRESSION: Status post fluoroscopic placed percutaneous gastrostomy tube, with 20 [place] pull-through. Signed, Yvone Neu. Loreta Ave, DO [facility] Radiology Electronically Signed   By: Gilmer Mor D.O.   On: 01/07/2023 14:13    Cardiac Studies   2D echo 01/03/2023: 1. There is a 3.3 x 1.7 cm mural thrombus near the left ventricular apex.  Left ventricular ejection fraction, by estimation, is 35 to 40%. The left  ventricle has moderately decreased function. The left ventricle  demonstrates regional wall motion  abnormalities (see scoring diagram/findings for description). Left  ventricular diastolic parameters are consistent  with Grade I diastolic  dysfunction (impaired relaxation). There is akinesis of the left  ventricular, mid-apical anterior wall and  anteroseptal wall. There is akinesis of the left ventricular, apical  inferior segment. There is akinesis of the left ventricular, apical  segment.   2. Right ventricular systolic function is normal. The right ventricular  size is mildly enlarged. There is normal pulmonary artery systolic  pressure.   3. The mitral valve is abnormal. Trivial mitral valve regurgitation. No  evidence of mitral stenosis.   4. The aortic valve has an indeterminant number of cusps. There is  moderate thickening of the aortic valve. Aortic valve regurgitation is  trivial. Aortic valve sclerosis/calcification is present, without any  evidence of aortic stenosis.   5. The inferior vena cava is normal in size with greater than 50%  respiratory variability, suggesting right atrial pressure of 3 mmHg.   Patient Profile     72 y.o. male with history of cCAD status post PCI of the LAD and OM 2 (3/03), hypertension,hyperlipidemia, type 2 diabetes, stage IVa oropharyngeal cancer with squamous cell carcinoma on concurrent chemoradiation, history of DVT previously on rivaroxaban stopped by his PCP at the [place], who has been seen and evaluated for atrial flutter, NSTEMI, and mural thrombus.   Assessment & Plan    1. NSTEMI: -Currently, without symptoms of angina or cardiac decompensation -Peak troponin 3672 -Heparin gtt as below -NPO at midnight, Sunday 5/19, going into Monday, 5/20 -Plan for [facility] 5/20 -ASA, Lipitor, Toprol  2. HFrEF with mural thrombus: -Euvolemic  -Plan for [facility] 5/20 as above for further evaluation of cardiomyopathy -Toprol XL and Jardiance  -Further escalation of GDMT with ARNI/ARB/ACEi/MRA limited by relative hypotension, escalate as able -Currently on heparin gtt with regards to mural thrombus, transition to [facility] prior to discharge with plans for a follow up echo  in 3 months -CHF education -Daily weights, strict I/O  3. New onset atrial flutter: -Maintaining sinus rhythm  -Toprol XL  -CHADS2VASc at least 4 (CHF, HTN, age x 1, vascular disease) -Transition from heparin gtt to  OAC prior to discharge  4. Stage IV oropharyngeal squamous cell carcinoma: -Status post PEG tub placement 5/17 with severe protein calorie malnutrition  -On chemoradiation  5. Hypokalemia: -Repletion ordered  6. Anemia/thrombocytopenia: -Overall, stable -Monitor   7. HLD: -LDL 92 this admission with goal being at least < 70, possibly 55 -Increase Lipitor to 80 mg -Follow up fasting lipid panel and LFT in the office in 2 months with recommendation to escalate lipid lowering therapy as indicated to achieve target LDL    Shared Decision Making/Informed Consent{  The risks [stroke (1 in 1000), death (1 in 1000), kidney failure [usually temporary] (1 in 500), bleeding (1 in 200), allergic reaction [possibly serious] (1 in 200)], benefits (diagnostic support and management of coronary artery disease) and alternatives of a cardiac catheterization were discussed in detail with Mr. Lanyon and he is willing to proceed.   For questions or updates, please contact [facility] Please consult [email or web address] for contact info under Cardiology/STEMI.    Signed, Eula Listen, PA-C [facility] Pager: [phone] 01/08/2023, 9:29 AM

## 2023-01-08 NOTE — Progress Notes (Signed)
Progress Note   Patient: Kenneth Gilmore:[id number] DOB: 10-11-1950 DOA: 01/03/2023     5 DOS: the patient was seen and examined on 01/08/2023   Brief hospital course: Mr. Kenneth Gilmore is a 72 year old male with non-insulin-dependent diabetes mellitus, hyperlipidemia, history of DVT on Xarelto, oropharyngeal squamous cell carcinoma stage IVa, who presents emergency department from oncology clinic for chief concerns of syncope event at home while he was in the shower. High sensitive troponin is 2925.  Telemetry showed atrial flutter, he was initially given diltiazem gtt., and quickly converted to sinus. Patient also had a significant dysphagia from oropharyngeal cancer, PEG tube is placed on 5/17, TF started.  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  IV heparin started.    Principal Problem:   NSTEMI (non-ST elevated myocardial infarction) (HCC) Active Problems:   Oropharyngeal cancer (HCC)   History of DVT (deep vein thrombosis)   Neoplasm related pain   Dysphagia   Diabetes mellitus type 2, noninsulin dependent (HCC)   Protein-calorie malnutrition, severe (HCC)   Benign essential tremor   Atrial flutter (HCC)   Hypotension   Syncope and collapse   Palliative care encounter   Hypomagnesemia   Hypokalemia   LV (left ventricular) mural thrombus   Chronic systolic CHF (congestive heart failure) (HCC)   Thrombocythemia   Poor nutrition   Dilated cardiomyopathy (HCC)   Pancytopenia (HCC)   Assessment and Plan: New onset atrial flutter. Chronic systolic congestive heart failure. Elevated troponin secondary to atrial flutter and congestive heart failure. LV thrombus. Patient so far has converted to sinus rhythm, currently does not seem to have any volume overload.  Patient has been evaluated by cardiology, on heparin drip.   Patient condition is stable, no volume overload.  Scheduled for heart cath on Monday.    Syncope and collapse. Most likely secondary to atrial flutter and drop of blood pressure.  Continue telemetry monitoring.   stage IVa oropharyngeal squamous cell carcinoma on on concurrent chemoradiation  Dysphagia secondary to oropharyngeal cancer. Severe protein calorie malnutrition secondary dysphagia. PEG tube was placed on 5/17, tube feeding started on 5/18.   Type 2 diabetes Continue current regimen.   Hypokalemia Hypomagnesemia. Patient received 4 g of magnesium sulfate, potassium IV and a PEG tube.   Chronic thrombocytopenia. Pancytopenia. Continue to follow.      Subjective:  Patient was started on tube feeding, no abdominal pain nausea vomiting.  No shortness of breath or chest pain.  Physical Exam: Vitals:   01/08/23 0414 01/08/23 0500 01/08/23 0825 01/08/23 1141  BP: 94/61  106/68 102/65  Pulse: 72  71 69  Resp: 19  20 20   Temp: 98.1 F (36.7 C)  98.4 F (36.9 C) 98.1 F (36.7 C)  TempSrc:   Oral Oral  SpO2: 98%  99% 100%  Weight:  80.1 kg    Height:       General exam: Appears calm and comfortable  Respiratory system: Clear to auscultation. Respiratory effort normal. Cardiovascular system: S1 & S2 heard, RRR. No JVD, murmurs, rubs, gallops or clicks. No pedal edema. Gastrointestinal system: Abdomen is nondistended, soft and nontender. No organomegaly or masses felt. Normal bowel sounds heard. Central nervous system: Alert and oriented. No focal neurological deficits. Extremities: Symmetric 5 x 5 power. Skin: No rashes, lesions or ulcers Psychiatry: Judgement and insight appear normal. Mood & affect appropriate.    Data Reviewed:  Lab results reviewed.  Family Communication: None  Disposition: Status  is: Inpatient Remains inpatient appropriate because: Severity of disease, inpatient procedure pending.     Time spent: 35 minutes  Author: Marrion Coy, MD 01/08/2023 1:00 PM  For on call review [email or web address].

## 2023-01-08 NOTE — Progress Notes (Signed)
ANTICOAGULATION CONSULT NOTE  Pharmacy Consult for Heparin Indication: NSTEMI/mural thrombus/hx of DVT/aflutter  No Known Allergies  Patient Measurements: Height: 5\' 9"  (175.3 cm) Weight: 79.1 kg (174 lb 6.1 oz) IBW/kg (Calculated) : 70.7 Heparin Dosing Weight: 78 kg  Vital Signs: Temp: 98.4 F (36.9 C) (05/18 0004) BP: 90/54 (05/18 0004) Pulse Rate: 79 (05/18 0004)  Labs: Recent Labs    01/05/23 0530 01/05/23 1447 01/06/23 0519 01/07/23 0535 01/07/23 0956 01/08/23 0019  HGB 11.4*  --  10.8* 11.2*  --  10.4*  HCT 31.3*  --  30.2* 31.3*  --  29.1*  PLT 96*  --  93* 92*  --  97*  LABPROT  --   --   --   --  14.4  --   INR  --   --   --   --  1.1  --   HEPARINUNFRC 0.74*   < > 0.52 0.54  --  0.34  CREATININE 0.61  --  0.67 0.65  --   --    < > = values in this interval not displayed.     Estimated Creatinine Clearance: 83.5 mL/min (by C-G formula based on SCr of 0.65 mg/dL).  Assessment: 53 yoM w/ hx CAD s/p PCI and recently off rivaroxiban for hx of DVT, admitted with syncopal episode, new onset Afib and elevated troponins. Pharmacy has been consulted to assist with systemic heparin therapy. Hgb and plt are trending down. Per patient account, has been off xarelto for several weeks due to difficulty swallowing and [facility]. Pt is on asa 81. Echo showed possible mural thrombus. New onset aflutter (CHADSVASc 4)  Goal of Therapy:  Heparin level 0.3-0.7 units/ml Monitor platelets by anticoagulation protocol: Yes  Plan:  Heparin level therapeutic Continue heparin infusion at 1100 units/hr Recheck HL in 8 hrs to confirm CBC daily while on heparin  Otelia Sergeant, PharmD, [facility] 01/08/2023 1:46 AM

## 2023-01-08 NOTE — Progress Notes (Signed)
Osmolite 1.5 tube feeding was started at 1000 this morning at a rate of 20cc/hr. Patient tolerated the tube feedings excellently throughout the day. No passing gas yet though.

## 2023-01-08 NOTE — Progress Notes (Signed)
ANTICOAGULATION CONSULT NOTE  Pharmacy Consult for Heparin Indication: NSTEMI/mural thrombus/hx of DVT/aflutter  No Known Allergies  Patient Measurements: Height: 5\' 9"  (175.3 cm) Weight: 80.1 kg (176 lb 9.4 oz) IBW/kg (Calculated) : 70.7 Heparin Dosing Weight: 78 kg  Vital Signs: Temp: 98.4 F (36.9 C) (05/18 0825) Temp Source: Oral (05/18 0825) BP: 106/68 (05/18 0825) Pulse Rate: 71 (05/18 0825)  Labs: Recent Labs    01/06/23 0519 01/07/23 0535 01/07/23 0956 01/08/23 0019 01/08/23 0440 01/08/23 0854  HGB 10.8* 11.2*  --  10.4* 10.5*  --   HCT 30.2* 31.3*  --  29.1* 29.5*  --   PLT 93* 92*  --  97* 94*  --   LABPROT  --   --  14.4  --   --   --   INR  --   --  1.1  --   --   --   HEPARINUNFRC 0.52 0.54  --  0.34  --  0.53  CREATININE 0.67 0.65  --   --  0.70  --      Estimated Creatinine Clearance: 83.5 mL/min (by C-G formula based on SCr of 0.7 mg/dL).  Assessment: 68 yoM w/ hx CAD s/p PCI and recently off rivaroxiban for hx of DVT, admitted with syncopal episode, new onset Afib and elevated troponins. Pharmacy has been consulted to assist with systemic heparin therapy. Hgb and plt are trending down. Per patient account, has been off xarelto for several weeks due to difficulty swallowing [facility]. Pt is on asa 81. Echo showed possible mural thrombus. New onset aflutter (CHADSVASc 4)  Goal of Therapy:  Heparin level 0.3-0.7 units/ml Monitor platelets by anticoagulation protocol: Yes  Plan:  Heparin level therapeutic x 2 Continue heparin infusion at 1100 units/hr Recheck HL with AM labs CBC daily while on heparin  Bettey Costa, PharmD Clinical Pharmacist 01/08/2023 9:46 AM

## 2023-01-09 DIAGNOSIS — C109 Malignant neoplasm of oropharynx, unspecified: Secondary | ICD-10-CM | POA: Diagnosis not present

## 2023-01-09 DIAGNOSIS — I5022 Chronic systolic (congestive) heart failure: Secondary | ICD-10-CM | POA: Diagnosis not present

## 2023-01-09 DIAGNOSIS — I214 Non-ST elevation (NSTEMI) myocardial infarction: Secondary | ICD-10-CM | POA: Diagnosis not present

## 2023-01-09 DIAGNOSIS — I513 Intracardiac thrombosis, not elsewhere classified: Secondary | ICD-10-CM | POA: Diagnosis not present

## 2023-01-09 LAB — PHOSPHORUS: Phosphorus: 3.1 mg/dL (ref 2.5–4.6)

## 2023-01-09 LAB — MAGNESIUM: Magnesium: 1.9 mg/dL (ref 1.7–2.4)

## 2023-01-09 LAB — GLUCOSE, CAPILLARY
Glucose-Capillary: 128 mg/dL — ABNORMAL HIGH (ref 70–99)
Glucose-Capillary: 132 mg/dL — ABNORMAL HIGH (ref 70–99)
Glucose-Capillary: 134 mg/dL — ABNORMAL HIGH (ref 70–99)
Glucose-Capillary: 143 mg/dL — ABNORMAL HIGH (ref 70–99)
Glucose-Capillary: 147 mg/dL — ABNORMAL HIGH (ref 70–99)
Glucose-Capillary: 167 mg/dL — ABNORMAL HIGH (ref 70–99)

## 2023-01-09 LAB — BASIC METABOLIC PANEL
Anion gap: 11 (ref 5–15)
BUN: 14 mg/dL (ref 8–23)
CO2: 24 mmol/L (ref 22–32)
Calcium: 8.5 mg/dL — ABNORMAL LOW (ref 8.9–10.3)
Chloride: 101 mmol/L (ref 98–111)
Creatinine, Ser: 0.7 mg/dL (ref 0.61–1.24)
GFR, Estimated: >60 mL/min (ref >60)
Glucose, Bld: 154 mg/dL — ABNORMAL HIGH (ref 70–99)
Potassium: 3.9 mmol/L (ref 3.5–5.1)
Sodium: 136 mmol/L (ref 135–145)

## 2023-01-09 LAB — HEPARIN LEVEL (UNFRACTIONATED): Heparin Unfractionated: 0.34 IU/mL (ref 0.30–0.70)

## 2023-01-09 NOTE — Progress Notes (Signed)
ANTICOAGULATION CONSULT NOTE  Pharmacy Consult for Heparin Indication: NSTEMI/mural thrombus/hx of DVT/aflutter  No Known Allergies  Patient Measurements: Height: 5\' 9"  (175.3 cm) Weight: 80.1 kg (176 lb 9.4 oz) IBW/kg (Calculated) : 70.7 Heparin Dosing Weight: 78 kg  Vital Signs: Temp: 97.7 F (36.5 C) (05/18 2259) Temp Source: Oral (05/18 2009) BP: 101/70 (05/18 2259) Pulse Rate: 78 (05/18 2259)  Labs: Recent Labs    01/07/23 0535 01/07/23 0956 01/08/23 0019 01/08/23 0440 01/08/23 0854 01/09/23 0425  HGB 11.2*  --  10.4* 10.5*  --   --   HCT 31.3*  --  29.1* 29.5*  --   --   PLT 92*  --  97* 94*  --   --   LABPROT  --  14.4  --   --   --   --   INR  --  1.1  --   --   --   --   HEPARINUNFRC 0.54  --  0.34  --  0.53 0.34  CREATININE 0.65  --   --  0.70  --  0.70     Estimated Creatinine Clearance: 83.5 mL/min (by C-G formula based on SCr of 0.7 mg/dL).  Assessment: 67 yoM w/ hx CAD s/p PCI and recently off rivaroxiban for hx of DVT, admitted with syncopal episode, new onset Afib and elevated troponins. Pharmacy has been consulted to assist with systemic heparin therapy. Hgb and plt are trending down. Per patient account, has been off xarelto for several weeks due to difficulty swallowing and [facility]. Pt is on asa 81. Echo showed possible mural thrombus. New onset aflutter (CHADSVASc 4)  Goal of Therapy:  Heparin level 0.3-0.7 units/ml Monitor platelets by anticoagulation protocol: Yes  Plan:  Heparin level therapeutic x 3 Continue heparin infusion at 1100 units/hr Recheck HL with AM labs CBC daily while on heparin  Otelia Sergeant, PharmD, [facility] 01/09/2023 5:34 AM

## 2023-01-09 NOTE — Progress Notes (Signed)
Progress Note   Patient: Kenneth Gilmore ZOX:[id number] DOB: Apr 27, 1951 DOA: 01/03/2023     6 DOS: the patient was seen and examined on 01/09/2023   Brief hospital course: Mr. Anthoni Chirdon is a 72 year old male with non-insulin-dependent diabetes mellitus, hyperlipidemia, history of DVT on Xarelto, oropharyngeal squamous cell carcinoma stage IVa, who presents emergency department from oncology clinic for chief concerns of syncope event at home while he was in the shower. High sensitive troponin is 2925.  Telemetry showed atrial flutter, he was initially given diltiazem gtt., and quickly converted to sinus. Patient also had a significant dysphagia from oropharyngeal cancer, PEG tube is placed on 5/17, TF started.  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  IV heparin started.    Principal Problem:   NSTEMI (non-ST elevated myocardial infarction) (HCC) Active Problems:   Oropharyngeal cancer (HCC)   History of DVT (deep vein thrombosis)   Neoplasm related pain   Dysphagia   Diabetes mellitus type 2, noninsulin dependent (HCC)   Protein-calorie malnutrition, severe (HCC)   Benign essential tremor   Atrial flutter (HCC)   Hypotension   Syncope and collapse   Palliative care encounter   Hypomagnesemia   Hypokalemia   LV (left ventricular) mural thrombus   Chronic systolic CHF (congestive heart failure) (HCC)   Thrombocythemia   Poor nutrition   Dilated cardiomyopathy (HCC)   Pancytopenia (HCC)   Assessment and Plan: New onset atrial flutter. Chronic systolic congestive heart failure. Elevated troponin secondary to atrial flutter and congestive heart failure. LV thrombus. Patient so far has converted to sinus rhythm, currently does not seem to have any volume overload.  Patient has been evaluated by cardiology, on heparin drip.   Heart cath scheduled for tomorrow   Syncope and collapse. Most likely secondary to atrial  flutter and drop of blood pressure.  Continue telemetry monitoring.   stage IVa oropharyngeal squamous cell carcinoma on on concurrent chemoradiation  Dysphagia secondary to oropharyngeal cancer. Severe protein calorie malnutrition secondary dysphagia. PEG tube was placed on 5/17, tube feeding started on 5/18. Patient has been tolerating tube feeding well.  Type 2 diabetes Diabetes still controlled.  Continue to follow.   Hypokalemia Hypomagnesemia. Recheck labs tomorrow.   Chronic thrombocytopenia. Pancytopenia. Continue to follow.     Subjective:  Patient doing well, tolerating tube feeding.  Physical Exam: Vitals:   01/09/23 0556 01/09/23 0610 01/09/23 0615 01/09/23 0802  BP: 91/64 95/63  93/65  Pulse: 71   81  Resp: 18 15  15   Temp: 98.4 F (36.9 C)   97.8 F (36.6 C)  TempSrc:    Oral  SpO2: 98%   97%  Weight:   77.3 kg   Height:       General exam: Appears calm and comfortable  Respiratory system: Clear to auscultation. Respiratory effort normal. Cardiovascular system: S1 & S2 heard, RRR. No JVD, murmurs, rubs, gallops or clicks. No pedal edema. Gastrointestinal system: Abdomen is nondistended, soft and nontender. No organomegaly or masses felt. Normal bowel sounds heard. Central nervous system: Alert and oriented. No focal neurological deficits. Extremities: Symmetric 5 x 5 power. Skin: No rashes, lesions or ulcers Psychiatry: Judgement and insight appear normal. Mood & affect appropriate.    Data Reviewed:  Lab results reviewed.  Family Communication: None  Disposition: Status is: Inpatient Remains inpatient appropriate because: Severity of disease, IV treatment.  Inpatient procedure.     Time spent: 35 minutes  Author: Marrion Coy,  MD 01/09/2023 11:17 AM  For on call review [email or web address].

## 2023-01-09 NOTE — Progress Notes (Signed)
Progress Note  Patient Name: Kenneth Gilmore Date of Encounter: 01/09/2023  Primary Cardiologist: New - consult by Kirke Corin  Subjective   No chest pain, dyspnea, palpitations, dizziness, presyncope, or syncope. Status post PEG tube placement 5/17. Planning for [facility] 5/20.   Inpatient Medications    Scheduled Meds:  aspirin EC  81 mg Oral Daily   atorvastatin  80 mg Oral Daily   empagliflozin  10 mg Oral Daily   feeding supplement (PROSource TF20)  60 mL Per Tube BID   free water  140 mL Per Tube Q4H   metoprolol succinate  25 mg Oral Daily   neomycin-bacitracin-polymyxin  1 Application Topical Daily   QUEtiapine  25 mg Per Tube QHS   thiamine  100 mg Per Tube Daily   Continuous Infusions:  feeding supplement (OSMOLITE 1.5 CAL) 30 mL/hr at 01/09/23 0025   heparin 1,100 Units/hr (01/08/23 1009)   PRN Meds: oxyCODONE, senna-docusate   Vital Signs    Vitals:   01/08/23 2259 01/09/23 0556 01/09/23 0610 01/09/23 0615  BP: 101/70 91/64 95/63    Pulse: 78 71    Resp: 18 18 15    Temp: 97.7 F (36.5 C) 98.4 F (36.9 C)    TempSrc:      SpO2: 97% 98%    Weight:    77.3 kg  Height:        Intake/Output Summary (Last 24 hours) at 01/09/2023 0732 Last data filed at 01/09/2023 0615 Gross per 24 hour  Intake 1471.27 ml  Output 2725 ml  Net -1253.73 ml    Filed Weights   01/07/23 1309 01/08/23 0500 01/09/23 0615  Weight: 79.1 kg 80.1 kg 77.3 kg    Telemetry    SR with PACs and PVCs - Personally Reviewed  ECG    No new tracings - Personally Reviewed  Physical Exam   GEN: No acute distress.   Neck: No JVD. Cardiac: RRR, no murmurs, rubs, or gallops.  Respiratory: Clear to auscultation bilaterally.  GI: Soft, nontender, non-distended.   MS: No edema; No deformity. Neuro:  Alert and oriented x 3; Nonfocal.  Psych: Normal affect.  Labs    Chemistry Recent Labs  Lab 01/03/23 0850 01/03/23 1033 01/07/23 0535 01/08/23 0440 01/09/23 0425  NA 137   < > 139  137 136  K 3.7   < > 3.3* 3.4* 3.9  CL 98   < > 103 102 101  CO2 22   < > 25 23 24   GLUCOSE 172*   < > 103* 107* 154*  BUN 21   < > 10 10 14   CREATININE 0.87   < > 0.65 0.70 0.70  CALCIUM 8.9   < > 8.4* 8.2* 8.5*  PROT 6.7  --   --   --   --   ALBUMIN 3.4*  --   --   --   --   AST 24  --   --   --   --   ALT 19  --   --   --   --   ALKPHOS 68  --   --   --   --   BILITOT 1.4*  --   --   --   --   GFRNONAA >60   < > >60 >60 >60  ANIONGAP 17*   < > 11 12 11    < > = values in this interval not displayed.      Hematology Recent Labs  Lab 01/07/23 [phone]  01/08/23 0019 01/08/23 0440  WBC 3.2* 5.4 5.1  RBC 3.49* 3.24* 3.30*  HGB 11.2* 10.4* 10.5*  HCT 31.3* 29.1* 29.5*  MCV 89.7 89.8 89.4  MCH 32.1 32.1 31.8  MCHC 35.8 35.7 35.6  RDW 12.3 12.4 12.5  PLT 92* 97* 94*     Cardiac EnzymesNo results for input(s): "TROPONINI" in the last 168 hours. No results for input(s): "TROPIPOC" in the last 168 hours.   BNPNo results for input(s): "BNP", "PROBNP" in the last 168 hours.   DDimer No results for input(s): "DDIMER" in the last 168 hours.   Radiology    IR GASTROSTOMY TUBE MOD SED  Result Date: 01/07/2023 IMPRESSION: Status post fluoroscopic placed percutaneous gastrostomy tube, with 20 [place] pull-through. Signed, Yvone Neu. Loreta Ave, DO [facility] Electronically Signed   By: Gilmer Mor D.O.   On: 01/07/2023 14:13    Cardiac Studies   2D echo 01/03/2023: 1. There is a 3.3 x 1.7 cm mural thrombus near the left ventricular apex.  Left ventricular ejection fraction, by estimation, is 35 to 40%. The left  ventricle has moderately decreased function. The left ventricle  demonstrates regional wall motion  abnormalities (see scoring diagram/findings for description). Left  ventricular diastolic parameters are consistent with Grade I diastolic  dysfunction (impaired relaxation). There is akinesis of the left  ventricular,  mid-apical anterior wall and  anteroseptal wall. There is akinesis of the left ventricular, apical  inferior segment. There is akinesis of the left ventricular, apical  segment.   2. Right ventricular systolic function is normal. The right ventricular  size is mildly enlarged. There is normal pulmonary artery systolic  pressure.   3. The mitral valve is abnormal. Trivial mitral valve regurgitation. No  evidence of mitral stenosis.   4. The aortic valve has an indeterminant number of cusps. There is  moderate thickening of the aortic valve. Aortic valve regurgitation is  trivial. Aortic valve sclerosis/calcification is present, without any  evidence of aortic stenosis.   5. The inferior vena cava is normal in size with greater than 50%  respiratory variability, suggesting right atrial pressure of 3 mmHg.   Patient Profile     72 y.o. male with history of cCAD status post PCI of the LAD and OM 2 (3/03), hypertension,hyperlipidemia, type 2 diabetes, stage IVa oropharyngeal cancer with squamous cell carcinoma on concurrent chemoradiation, history of DVT previously on rivaroxaban stopped by his PCP at the [place], who has been seen and evaluated for atrial flutter, NSTEMI, and mural thrombus.   Assessment & Plan    1. NSTEMI: -Currently, without symptoms of angina or cardiac decompensation -Peak troponin 3672 -Heparin gtt as below -NPO at midnight, Sunday 5/19, going into Monday, 5/20 -Plan for [facility] 5/20 -ASA, Lipitor, Toprol  2. HFrEF with mural thrombus: -Euvolemic  -Plan for [facility] 5/20 as above for further evaluation of cardiomyopathy -Toprol XL and Jardiance  -Further escalation of GDMT with ARNI/ARB/ACEi/MRA limited by relative hypotension, escalate as able -Currently on heparin gtt with regards to mural thrombus, transition to [facility] prior to discharge with plans for a follow up echo in 3 months -CHF education -Daily weights, strict I/O  3. New onset atrial flutter: -Maintaining  sinus rhythm  -Toprol XL  -CHADS2VASc at least 4 (CHF, HTN, age x 1, vascular disease) -Transition from heparin gtt to [facility] prior to discharge  4. Stage IV oropharyngeal squamous cell carcinoma: -Status post PEG tub placement 5/17 with severe protein calorie malnutrition  -  On chemoradiation  5. Hypokalemia: -Repletion ordered  6. Anemia/thrombocytopenia: -Overall, stable -Monitor   7. HLD: -LDL 92 this admission with goal being at least < 70, possibly 55 -Now on titrated dose of Lipitor to 80 mg -Follow up fasting lipid panel and LFT in the office in 2 months with recommendation to escalate lipid lowering therapy as indicated to achieve target LDL    Shared Decision Making/Informed Consent{  The risks [stroke (1 in 1000), death (1 in 1000), kidney failure [usually temporary] (1 in 500), bleeding (1 in 200), allergic reaction [possibly serious] (1 in 200)], benefits (diagnostic support and management of coronary artery disease) and alternatives of a cardiac catheterization were discussed in detail with Mr. Glessner and he is willing to proceed.   For questions or updates, please contact [facility] Please consult [email or web address] for contact info under Cardiology/STEMI.    Signed, Eula Listen, PA-C [facility] Pager: [phone] 01/09/2023, 7:32 AM

## 2023-01-09 NOTE — Plan of Care (Signed)

## 2023-01-09 NOTE — Progress Notes (Signed)
PT Cancellation Note  Patient Details Name: Kenneth Gilmore MRN: [id number] DOB: 03-24-1951   Cancelled Treatment:     Pt refused. Agreeable to session on a later date.    Kynlea Blackston A Breyon Sigg 01/09/2023, 1:16 PM

## 2023-01-09 NOTE — H&P (View-Only) (Signed)
 Progress Note  Patient Name: Kenneth Gilmore Date of Encounter: 01/09/2023  Primary Cardiologist: New - consult by Arida  Subjective   No chest pain, dyspnea, palpitations, dizziness, presyncope, or syncope. Status post PEG tube placement 5/17. Planning for R/LHC 5/20.   Inpatient Medications    Scheduled Meds:  aspirin EC  81 mg Oral Daily   atorvastatin  80 mg Oral Daily   empagliflozin  10 mg Oral Daily   feeding supplement (PROSource TF20)  60 mL Per Tube BID   free water  140 mL Per Tube Q4H   metoprolol succinate  25 mg Oral Daily   neomycin-bacitracin-polymyxin  1 Application Topical Daily   QUEtiapine  25 mg Per Tube QHS   thiamine  100 mg Per Tube Daily   Continuous Infusions:  feeding supplement (OSMOLITE 1.5 CAL) 30 mL/hr at 01/09/23 0025   heparin 1,100 Units/hr (01/08/23 1009)   PRN Meds: oxyCODONE, senna-docusate   Vital Signs    Vitals:   01/08/23 2259 01/09/23 0556 01/09/23 0610 01/09/23 0615  BP: 101/70 91/64 95/63   Pulse: 78 71    Resp: 18 18 15   Temp: 97.7 F (36.5 C) 98.4 F (36.9 C)    TempSrc:      SpO2: 97% 98%    Weight:    77.3 kg  Height:        Intake/Output Summary (Last 24 hours) at 01/09/2023 0732 Last data filed at 01/09/2023 0615 Gross per 24 hour  Intake 1471.27 ml  Output 2725 ml  Net -1253.73 ml    Filed Weights   01/07/23 1309 01/08/23 0500 01/09/23 0615  Weight: 79.1 kg 80.1 kg 77.3 kg    Telemetry    SR with PACs and PVCs - Personally Reviewed  ECG    No new tracings - Personally Reviewed  Physical Exam   GEN: No acute distress.   Neck: No JVD. Cardiac: RRR, no murmurs, rubs, or gallops.  Respiratory: Clear to auscultation bilaterally.  GI: Soft, nontender, non-distended.   MS: No edema; No deformity. Neuro:  Alert and oriented x 3; Nonfocal.  Psych: Normal affect.  Labs    Chemistry Recent Labs  Lab 01/03/23 0850 01/03/23 1033 01/07/23 0535 01/08/23 0440 01/09/23 0425  NA 137   < > 139  137 136  K 3.7   < > 3.3* 3.4* 3.9  CL 98   < > 103 102 101  CO2 22   < > 25 23 24  GLUCOSE 172*   < > 103* 107* 154*  BUN 21   < > 10 10 14  CREATININE 0.87   < > 0.65 0.70 0.70  CALCIUM 8.9   < > 8.4* 8.2* 8.5*  PROT 6.7  --   --   --   --   ALBUMIN 3.4*  --   --   --   --   AST 24  --   --   --   --   ALT 19  --   --   --   --   ALKPHOS 68  --   --   --   --   BILITOT 1.4*  --   --   --   --   GFRNONAA >60   < > >60 >60 >60  ANIONGAP 17*   < > 11 12 11   < > = values in this interval not displayed.      Hematology Recent Labs  Lab 01/07/23 0535   01/08/23 0019 01/08/23 0440  WBC 3.2* 5.4 5.1  RBC 3.49* 3.24* 3.30*  HGB 11.2* 10.4* 10.5*  HCT 31.3* 29.1* 29.5*  MCV 89.7 89.8 89.4  MCH 32.1 32.1 31.8  MCHC 35.8 35.7 35.6  RDW 12.3 12.4 12.5  PLT 92* 97* 94*     Cardiac EnzymesNo results for input(s): "TROPONINI" in the last 168 hours. No results for input(s): "TROPIPOC" in the last 168 hours.   BNPNo results for input(s): "BNP", "PROBNP" in the last 168 hours.   DDimer No results for input(s): "DDIMER" in the last 168 hours.   Radiology    IR GASTROSTOMY TUBE MOD SED  Result Date: 01/07/2023 IMPRESSION: Status post fluoroscopic placed percutaneous gastrostomy tube, with 20 [place] pull-through. Signed, Jaime S. Wagner, DO [facility] [place] [facility] Electronically Signed   By: Jaime  Wagner D.O.   On: 01/07/2023 14:13    Cardiac Studies   2D echo 01/03/2023: 1. There is a 3.3 x 1.7 cm mural thrombus near the left ventricular apex.  Left ventricular ejection fraction, by estimation, is 35 to 40%. The left  ventricle has moderately decreased function. The left ventricle  demonstrates regional wall motion  abnormalities (see scoring diagram/findings for description). Left  ventricular diastolic parameters are consistent with Grade I diastolic  dysfunction (impaired relaxation). There is akinesis of the left  ventricular,  mid-apical anterior wall and  anteroseptal wall. There is akinesis of the left ventricular, apical  inferior segment. There is akinesis of the left ventricular, apical  segment.   2. Right ventricular systolic function is normal. The right ventricular  size is mildly enlarged. There is normal pulmonary artery systolic  pressure.   3. The mitral valve is abnormal. Trivial mitral valve regurgitation. No  evidence of mitral stenosis.   4. The aortic valve has an indeterminant number of cusps. There is  moderate thickening of the aortic valve. Aortic valve regurgitation is  trivial. Aortic valve sclerosis/calcification is present, without any  evidence of aortic stenosis.   5. The inferior vena cava is normal in size with greater than 50%  respiratory variability, suggesting right atrial pressure of 3 mmHg.   Patient Profile     72 y.o. male with history of cCAD status post PCI of the LAD and OM 2 (3/03), hypertension,hyperlipidemia, type 2 diabetes, stage IVa oropharyngeal cancer with squamous cell carcinoma on concurrent chemoradiation, history of DVT [facility] and evaluated for atrial flutter, NSTEMI, and mural thrombus.   Assessment & Plan    1. NSTEMI: -Currently, without symptoms of angina or cardiac decompensation -Peak troponin 3672 -Heparin gtt as below -NPO at midnight, Sunday 5/19, going into Monday, 5/20 -Plan for R/LHC 5/20 -ASA, Lipitor, Toprol  2. HFrEF with mural thrombus: -Euvolemic  -Plan for R/LHC 5/20 as above for further evaluation of cardiomyopathy -Toprol XL and Jardiance  -Further escalation of GDMT with ARNI/ARB/ACEi/MRA limited by relative hypotension, escalate as able -Currently on heparin gtt with regards to mural thrombus, transition to OAC prior to discharge with plans for a follow up echo in 3 months -CHF education -Daily weights, strict I/O  3. New onset atrial flutter: -Maintaining  sinus rhythm  -Toprol XL  -CHADS2VASc at least 4 (CHF, HTN, age x 1, vascular disease) -Transition from heparin gtt to OAC prior to discharge  4. Stage IV oropharyngeal squamous cell carcinoma: -Status post PEG tub placement 5/17 with severe protein calorie malnutrition  -  On chemoradiation  5. Hypokalemia: -Repletion ordered  6. Anemia/thrombocytopenia: -Overall, stable -Monitor   7. HLD: -LDL 92 this admission with goal being at least < 70, possibly 55 -Now on titrated dose of Lipitor to 80 mg -Follow up fasting lipid panel and LFT in the office in 2 months with recommendation to escalate lipid lowering therapy as indicated to achieve target LDL    Shared Decision Making/Informed Consent{  The risks [stroke (1 in 1000), death (1 in 1000), kidney failure [usually temporary] (1 in 500), bleeding (1 in 200), allergic reaction [possibly serious] (1 in 200)], benefits (diagnostic support and management of coronary artery disease) and alternatives of a cardiac catheterization were discussed in detail with Mr. Victorian and he is willing to proceed.   For questions or updates, please contact [facility] Please consult [email or web address] for contact info under Cardiology/STEMI.    Signed, Ardelle Haliburton, PA-C [facility] Pager: [phone] 01/09/2023, 7:32 AM  

## 2023-01-10 ENCOUNTER — Ambulatory Visit: Payer: Medicare Other

## 2023-01-10 ENCOUNTER — Other Ambulatory Visit: Payer: Self-pay

## 2023-01-10 ENCOUNTER — Telehealth: Payer: Self-pay | Admitting: Speech Pathology

## 2023-01-10 ENCOUNTER — Encounter
Admission: EM | Disposition: A | Discharge status: Home / Self Care | Payer: Self-pay | Source: Home / Self Care | Attending: Internal Medicine

## 2023-01-10 DIAGNOSIS — I2510 Atherosclerotic heart disease of native coronary artery without angina pectoris: Secondary | ICD-10-CM

## 2023-01-10 DIAGNOSIS — I5022 Chronic systolic (congestive) heart failure: Secondary | ICD-10-CM | POA: Diagnosis not present

## 2023-01-10 DIAGNOSIS — Z510 Encounter for antineoplastic radiation therapy: Secondary | ICD-10-CM | POA: Diagnosis not present

## 2023-01-10 DIAGNOSIS — I214 Non-ST elevation (NSTEMI) myocardial infarction: Secondary | ICD-10-CM | POA: Diagnosis not present

## 2023-01-10 DIAGNOSIS — I483 Typical atrial flutter: Secondary | ICD-10-CM | POA: Diagnosis not present

## 2023-01-10 DIAGNOSIS — F17220 Nicotine dependence, chewing tobacco, uncomplicated: Secondary | ICD-10-CM | POA: Diagnosis not present

## 2023-01-10 DIAGNOSIS — C109 Malignant neoplasm of oropharynx, unspecified: Secondary | ICD-10-CM | POA: Diagnosis not present

## 2023-01-10 DIAGNOSIS — I513 Intracardiac thrombosis, not elsewhere classified: Secondary | ICD-10-CM | POA: Diagnosis not present

## 2023-01-10 HISTORY — PX: CORONARY STENT INTERVENTION: CATH118234

## 2023-01-10 HISTORY — PX: RIGHT/LEFT HEART CATH AND CORONARY ANGIOGRAPHY: CATH118266

## 2023-01-10 LAB — BASIC METABOLIC PANEL
Anion gap: 11 (ref 5–15)
BUN: 14 mg/dL (ref 8–23)
CO2: 30 mmol/L (ref 22–32)
Calcium: 8.6 mg/dL — ABNORMAL LOW (ref 8.9–10.3)
Chloride: 97 mmol/L — ABNORMAL LOW (ref 98–111)
Creatinine, Ser: 0.62 mg/dL (ref 0.61–1.24)
GFR, Estimated: >60 mL/min (ref >60)
Glucose, Bld: 179 mg/dL — ABNORMAL HIGH (ref 70–99)
Potassium: 3.6 mmol/L (ref 3.5–5.1)
Sodium: 138 mmol/L (ref 135–145)

## 2023-01-10 LAB — RAD ONC ARIA SESSION SUMMARY
Course Elapsed Days: 32
Plan Fractions Treated to Date: 17
Plan Prescribed Dose Per Fraction: 2 Gy
Plan Total Fractions Prescribed: 35
Plan Total Prescribed Dose: 70 Gy
Reference Point Dosage Given to Date: 34 Gy
Reference Point Session Dosage Given: 2 Gy
Session Number: 17

## 2023-01-10 LAB — POCT I-STAT EG7
Acid-Base Excess: 7 mmol/L — ABNORMAL HIGH (ref 0.0–2.0)
Acid-Base Excess: 7 mmol/L — ABNORMAL HIGH (ref 0.0–2.0)
Bicarbonate: 32.1 mmol/L — ABNORMAL HIGH (ref 20.0–28.0)
Bicarbonate: 32.4 mmol/L — ABNORMAL HIGH (ref 20.0–28.0)
Calcium, Ion: 1.2 mmol/L (ref 1.15–1.40)
Calcium, Ion: 1.2 mmol/L (ref 1.15–1.40)
HCT: 28 % — ABNORMAL LOW (ref 39.0–52.0)
HCT: 28 % — ABNORMAL LOW (ref 39.0–52.0)
Hemoglobin: 9.5 g/dL — ABNORMAL LOW (ref 13.0–17.0)
Hemoglobin: 9.5 g/dL — ABNORMAL LOW (ref 13.0–17.0)
O2 Saturation: 70 %
O2 Saturation: 71 %
Potassium: 3.9 mmol/L (ref 3.5–5.1)
Potassium: 3.9 mmol/L (ref 3.5–5.1)
Sodium: 137 mmol/L (ref 135–145)
Sodium: 137 mmol/L (ref 135–145)
TCO2: 34 mmol/L — ABNORMAL HIGH (ref 22–32)
TCO2: 34 mmol/L — ABNORMAL HIGH (ref 22–32)
pCO2, Ven: 48.3 mmHg (ref 44–60)
pCO2, Ven: 49 mmHg (ref 44–60)
pH, Ven: 7.43 (ref 7.25–7.43)
pH, Ven: 7.429 (ref 7.25–7.43)
pO2, Ven: 36 mmHg (ref 32–45)
pO2, Ven: 37 mmHg (ref 32–45)

## 2023-01-10 LAB — POCT I-STAT 7, (LYTES, BLD GAS, ICA,H+H)
Acid-base deficit: 1 mmol/L (ref 0.0–2.0)
Bicarbonate: 25 mmol/L (ref 20.0–28.0)
Calcium, Ion: 1.08 mmol/L — ABNORMAL LOW (ref 1.15–1.40)
HCT: 29 % — ABNORMAL LOW (ref 39.0–52.0)
Hemoglobin: 9.9 g/dL — ABNORMAL LOW (ref 13.0–17.0)
O2 Saturation: 99 %
Potassium: 3.1 mmol/L — ABNORMAL LOW (ref 3.5–5.1)
Sodium: 118 mmol/L — CL (ref 135–145)
TCO2: 26 mmol/L (ref 22–32)
pCO2 arterial: 48.4 mmHg — ABNORMAL HIGH (ref 32–48)
pH, Arterial: 7.321 — ABNORMAL LOW (ref 7.35–7.45)
pO2, Arterial: 140 mmHg — ABNORMAL HIGH (ref 83–108)

## 2023-01-10 LAB — MAGNESIUM: Magnesium: 1.8 mg/dL (ref 1.7–2.4)

## 2023-01-10 LAB — GLUCOSE, CAPILLARY
Glucose-Capillary: 122 mg/dL — ABNORMAL HIGH (ref 70–99)
Glucose-Capillary: 140 mg/dL — ABNORMAL HIGH (ref 70–99)
Glucose-Capillary: 150 mg/dL — ABNORMAL HIGH (ref 70–99)
Glucose-Capillary: 152 mg/dL — ABNORMAL HIGH (ref 70–99)
Glucose-Capillary: 76 mg/dL (ref 70–99)

## 2023-01-10 LAB — CBC
HCT: 29.9 % — ABNORMAL LOW (ref 39.0–52.0)
Hemoglobin: 10.6 g/dL — ABNORMAL LOW (ref 13.0–17.0)
MCH: 32.4 pg (ref 26.0–34.0)
MCHC: 35.5 g/dL (ref 30.0–36.0)
MCV: 91.4 fL (ref 80.0–100.0)
Platelets: 121 10*3/uL — ABNORMAL LOW (ref 150–400)
RBC: 3.27 MIL/uL — ABNORMAL LOW (ref 4.22–5.81)
RDW: 12.7 % (ref 11.5–15.5)
WBC: 2.8 10*3/uL — ABNORMAL LOW (ref 4.0–10.5)
nRBC: 0 % (ref 0.0–0.2)

## 2023-01-10 LAB — POCT ACTIVATED CLOTTING TIME
Activated Clotting Time: 244 seconds
Activated Clotting Time: 282 seconds
Activated Clotting Time: 352 seconds

## 2023-01-10 LAB — HEPARIN LEVEL (UNFRACTIONATED): Heparin Unfractionated: 0.31 IU/mL (ref 0.30–0.70)

## 2023-01-10 LAB — PHOSPHORUS: Phosphorus: 3.6 mg/dL (ref 2.5–4.6)

## 2023-01-10 SURGERY — RIGHT/LEFT HEART CATH AND CORONARY ANGIOGRAPHY
Anesthesia: Moderate Sedation

## 2023-01-10 MED ORDER — SODIUM CHLORIDE 0.9% FLUSH: 3.0000 mL | INTRAVENOUS | Status: DC | PRN

## 2023-01-10 MED ORDER — VERAPAMIL HCL 2.5 MG/ML IV SOLN
INTRAVENOUS | Status: AC
  Filled 2023-01-10: qty 2

## 2023-01-10 MED ORDER — FREE WATER
200.0000 mL | Freq: Three times a day (TID) | Status: DC
  Administered 2023-01-11 (×2): 200 mL

## 2023-01-10 MED ORDER — CLOPIDOGREL BISULFATE 300 MG PO TABS
ORAL_TABLET | ORAL | Status: AC
  Filled 2023-01-10: qty 2

## 2023-01-10 MED ORDER — HEPARIN (PORCINE) IN NACL 1000-0.9 UT/500ML-% IV SOLN
INTRAVENOUS | Status: AC
  Filled 2023-01-10: qty 1000

## 2023-01-10 MED ORDER — SODIUM CHLORIDE 0.9 % IV SOLN: INTRAVENOUS | Status: DC

## 2023-01-10 MED ORDER — MIDAZOLAM HCL 2 MG/2ML IJ SOLN
INTRAMUSCULAR | Status: DC | PRN
  Administered 2023-01-10: 1 mg via INTRAVENOUS
  Administered 2023-01-10: .5 mg via INTRAVENOUS
  Administered 2023-01-10: 1 mg via INTRAVENOUS

## 2023-01-10 MED ORDER — HEPARIN SODIUM (PORCINE) 1000 UNIT/ML IJ SOLN
INTRAMUSCULAR | Status: AC
  Filled 2023-01-10: qty 10

## 2023-01-10 MED ORDER — LIDOCAINE HCL 1 % IJ SOLN
INTRAMUSCULAR | Status: AC
  Filled 2023-01-10: qty 20

## 2023-01-10 MED ORDER — SODIUM CHLORIDE 0.9% FLUSH
3.0000 mL | Freq: Two times a day (BID) | INTRAVENOUS | Status: DC
  Administered 2023-01-10: 3 mL via INTRAVENOUS

## 2023-01-10 MED ORDER — FENTANYL CITRATE (PF) 100 MCG/2ML IJ SOLN
INTRAMUSCULAR | Status: AC
  Filled 2023-01-10: qty 2

## 2023-01-10 MED ORDER — CLOPIDOGREL BISULFATE 75 MG PO TABS
ORAL_TABLET | ORAL | Status: DC | PRN
  Administered 2023-01-10: 600 mg via ORAL

## 2023-01-10 MED ORDER — MIDAZOLAM HCL 2 MG/2ML IJ SOLN
INTRAMUSCULAR | Status: AC
  Filled 2023-01-10: qty 2

## 2023-01-10 MED ORDER — ASPIRIN 81 MG PO CHEW: 81.0000 mg | CHEWABLE_TABLET | ORAL | Status: DC

## 2023-01-10 MED ORDER — OSMOLITE 1.5 CAL PO LIQD
415.0000 mL | Freq: Three times a day (TID) | ORAL | Status: DC
  Administered 2023-01-11 (×2): 415 mL

## 2023-01-10 MED ORDER — VERAPAMIL HCL 2.5 MG/ML IV SOLN
INTRAVENOUS | Status: DC | PRN
  Administered 2023-01-10: 2.5 mg via INTRA_ARTERIAL

## 2023-01-10 MED ORDER — HEPARIN SODIUM (PORCINE) 1000 UNIT/ML IJ SOLN
INTRAMUSCULAR | Status: DC | PRN
  Administered 2023-01-10: 3500 [IU] via INTRAVENOUS
  Administered 2023-01-10: 2000 [IU] via INTRAVENOUS
  Administered 2023-01-10: 3500 [IU] via INTRAVENOUS

## 2023-01-10 MED ORDER — HEPARIN (PORCINE) 25000 UT/250ML-% IV SOLN: 1100.0000 [IU]/h | INTRAVENOUS | Status: DC

## 2023-01-10 MED ORDER — CLOPIDOGREL BISULFATE 75 MG PO TABS
75.0000 mg | ORAL_TABLET | Freq: Every day | ORAL | Status: DC
  Administered 2023-01-11: 75 mg via ORAL
  Filled 2023-01-10: qty 1

## 2023-01-10 MED ORDER — SODIUM CHLORIDE 0.9 % IV SOLN: INTRAVENOUS | Status: AC

## 2023-01-10 MED ORDER — HEPARIN (PORCINE) IN NACL 1000-0.9 UT/500ML-% IV SOLN
INTRAVENOUS | Status: DC | PRN
  Administered 2023-01-10 (×2): 1000 mL

## 2023-01-10 MED ORDER — IOHEXOL 300 MG/ML  SOLN
INTRAMUSCULAR | Status: DC | PRN
  Administered 2023-01-10: 180 mL

## 2023-01-10 MED ORDER — DEXTROSE IN LACTATED RINGERS 5 % IV SOLN: INTRAVENOUS | Status: DC

## 2023-01-10 MED ORDER — METOPROLOL SUCCINATE ER 50 MG PO TB24
50.0000 mg | ORAL_TABLET | Freq: Every day | ORAL | Status: DC
  Administered 2023-01-11: 50 mg via ORAL
  Filled 2023-01-10: qty 1

## 2023-01-10 MED ORDER — SODIUM CHLORIDE 0.9% FLUSH
3.0000 mL | Freq: Two times a day (BID) | INTRAVENOUS | Status: DC
  Administered 2023-01-11: 3 mL via INTRAVENOUS

## 2023-01-10 MED ORDER — SODIUM CHLORIDE 0.9 % IV SOLN: 250.0000 mL | INTRAVENOUS | Status: DC | PRN

## 2023-01-10 MED ORDER — FENTANYL CITRATE (PF) 100 MCG/2ML IJ SOLN
INTRAMUSCULAR | Status: DC | PRN
  Administered 2023-01-10 (×2): 25 ug via INTRAVENOUS

## 2023-01-10 SURGICAL SUPPLY — 31 items
BALLN MINITREK RX 2.0X20 (BALLOONS) ×1
BALLN SCOREFLEX 2.50X15 (BALLOONS) ×1
BALLN TREK RX 2.5X12 (BALLOONS) ×1
BALLN ~~LOC~~ TREK NEO RX 2.5X12 (BALLOONS) ×1
BALLN ~~LOC~~ TREK NEO RX 2.5X20 (BALLOONS)
BALLOON MINITREK RX 2.0X20 (BALLOONS) IMPLANT
BALLOON SCOREFLEX 2.50X15 (BALLOONS) IMPLANT
BALLOON TREK RX 2.5X12 (BALLOONS) IMPLANT
BALLOON ~~LOC~~ TREK NEO RX 2.5X12 (BALLOONS) IMPLANT
BALLOON ~~LOC~~ TREK NEO RX 2.5X20 (BALLOONS) IMPLANT
CATH BALLN WEDGE 5F 110CM (CATHETERS) IMPLANT
CATH INFINITI 5FR JK (CATHETERS) IMPLANT
CATH LAUNCHER 5F EBU3.5 (CATHETERS) IMPLANT
CATH TELESCOPE 6F GEC (CATHETERS) IMPLANT
DEVICE RAD TR BAND REGULAR (VASCULAR PRODUCTS) IMPLANT
DRAPE BRACHIAL (DRAPES) IMPLANT
DRAPE FEMORAL ANGIO W/ POUCH (DRAPES) IMPLANT
GLIDESHEATH SLEND SS 6F .021 (SHEATH) IMPLANT
GUIDEWIRE INQWIRE 1.5J.035X260 (WIRE) IMPLANT
INQWIRE 1.5J .035X260CM (WIRE) ×1
KIT ENCORE 26 ADVANTAGE (KITS) IMPLANT
KIT SYRINGE INJ CVI SPIKEX1 (MISCELLANEOUS) IMPLANT
PACK CARDIAC CATH (CUSTOM PROCEDURE TRAY) ×1 IMPLANT
PROTECTION STATION PRESSURIZED (MISCELLANEOUS) ×1
SET ATX-X65L (MISCELLANEOUS) IMPLANT
SHEATH GLIDE SLENDER 4/5FR (SHEATH) IMPLANT
STATION PROTECTION PRESSURIZED (MISCELLANEOUS) IMPLANT
STENT ONYX FRONTIER 2.25X38 (Permanent Stent) IMPLANT
STENT ONYX FRONTIER 2.5X12 (Permanent Stent) IMPLANT
TUBING CIL FLEX 10 FLL-RA (TUBING) IMPLANT
WIRE RUNTHROUGH .014X180CM (WIRE) IMPLANT

## 2023-01-10 NOTE — Progress Notes (Signed)
Patient returned to [facility] halfway thru left and right heart catheterization d/t emergent case. This RN started phase II recovery. Patient will be returning to cath lab after emergent case finished.

## 2023-01-10 NOTE — TOC Progression Note (Signed)
Transition of Care [facility]) - Progression Note    Patient Details  Name: Kenneth Gilmore MRN: [id number] Date of Birth: 1951/03/05  Transition of Care [facility]) CM/SW Contact  Truddie Hidden, RN Phone Number: 01/10/2023, 4:36 PM  Clinical Narrative:    Patient DME for TF sent to [facility] at [facility].         Expected Discharge Plan and Services                                               Social Determinants of Health (SDOH) Interventions SDOH Screenings   Food Insecurity: No Food Insecurity (01/06/2023)  Housing: Low Risk  (01/06/2023)  Transportation Needs: No Transportation Needs (01/06/2023)  Utilities: Not At Risk (01/06/2023)  Financial Resource Strain: Low Risk  (12/27/2022)  Social Connections: Moderately Integrated (12/27/2022)  Stress: Stress Concern Present (12/27/2022)  Tobacco Use: High Risk (01/07/2023)    Readmission Risk Interventions     No data to display

## 2023-01-10 NOTE — Progress Notes (Signed)
ANTICOAGULATION CONSULT NOTE  Pharmacy Consult for Heparin Indication: NSTEMI/mural thrombus/hx of DVT/aflutter  No Known Allergies  Patient Measurements: Height: 5\' 9"  (175.3 cm) Weight: 75.5 kg (166 lb 7.2 oz) IBW/kg (Calculated) : 70.7 Heparin Dosing Weight: 78 kg  Vital Signs: Temp: 98.4 F (36.9 C) (05/20 0319) Temp Source: Oral (05/20 0319) BP: 104/73 (05/20 0319) Pulse Rate: 71 (05/20 0319)  Labs: Recent Labs    01/07/23 0956 01/08/23 0019 01/08/23 0019 01/08/23 0440 01/08/23 0854 01/09/23 0425 01/10/23 0518  HGB  --  10.4*   < > 10.5*  --   --  10.6*  HCT  --  29.1*  --  29.5*  --   --  29.9*  PLT  --  97*  --  94*  --   --  121*  LABPROT 14.4  --   --   --   --   --   --   INR 1.1  --   --   --   --   --   --   HEPARINUNFRC  --  0.34   < >  --  0.53 0.34 0.31  CREATININE  --   --   --  0.70  --  0.70 0.62   < > = values in this interval not displayed.     Estimated Creatinine Clearance: 83.5 mL/min (by C-G formula based on SCr of 0.62 mg/dL).  Assessment: 35 yoM w/ hx CAD s/p PCI and recently off rivaroxiban for hx of DVT, admitted with syncopal episode, new onset Afib and elevated troponins. Pharmacy has been consulted to assist with systemic heparin therapy. Hgb and plt are trending down. Per patient account, has been off xarelto for several weeks due to difficulty swallowing and [facility]. Pt is on asa 81. Echo showed possible mural thrombus. New onset aflutter (CHADSVASc 4)  Goal of Therapy:  Heparin level 0.3-0.7 units/ml Monitor platelets by anticoagulation protocol: Yes  Plan:  Heparin level therapeutic x 3 Continue heparin infusion at 1100 units/hr Recheck HL with AM labs CBC daily while on heparin  Otelia Sergeant, PharmD, [facility] 01/10/2023 6:35 AM

## 2023-01-10 NOTE — Progress Notes (Addendum)
ANTICOAGULATION CONSULT NOTE  Pharmacy Consult for Heparin Indication: NSTEMI/mural thrombus/hx of DVT/aflutter  No Known Allergies  Patient Measurements: Height: 5\' 9"  (175.3 cm) Weight: 75.5 kg (166 lb 7.2 oz) IBW/kg (Calculated) : 70.7 Heparin Dosing Weight: 78 kg  Vital Signs: Temp: 98.6 F (37 C) (05/20 1246) Temp Source: Oral (05/20 1246) BP: 142/75 (05/20 1915) Pulse Rate: 68 (05/20 1915)  Labs: Recent Labs    01/08/23 0019 01/08/23 0440 01/08/23 0854 01/09/23 0425 01/10/23 0518 01/10/23 1427 01/10/23 1428 01/10/23 1752  HGB 10.4* 10.5*  --   --  10.6* 9.5* 9.5* 9.9*  HCT 29.1* 29.5*  --   --  29.9* 28.0* 28.0* 29.0*  PLT 97* 94*  --   --  121*  --   --   --   HEPARINUNFRC 0.34  --  0.53 0.34 0.31  --   --   --   CREATININE  --  0.70  --  0.70 0.62  --   --   --      Estimated Creatinine Clearance: 83.5 mL/min (by C-G formula based on SCr of 0.62 mg/dL).  Assessment: 73 yoM w/ hx CAD s/p PCI and recently off rivaroxiban for hx of DVT, admitted with syncopal episode, new onset Afib and elevated troponins. Pharmacy has been consulted to assist with systemic heparin therapy. Hgb and plt are trending down. Per patient account, has been off xarelto for several weeks due to difficulty swallowing and [facility]. Pt is on asa 81. Echo showed possible mural thrombus. New onset aflutter (CHADSVASc 4)  Goal of Therapy:  Heparin level 0.3-0.7 units/ml Monitor platelets by anticoagulation protocol: Yes  Plan:  Patient s/p R/LHC with stent placement Restart heparin infusion at previously therapeutic rate of 1100 units/hr @ 2300 on 01/10/23 Recheck HL 8 hours after infusion resumed  CBC daily while on heparin  Sharen Hones, PharmD, BCPS Clinical Pharmacist   01/10/2023 7:21 PM

## 2023-01-10 NOTE — Progress Notes (Signed)
Progress Note   Patient: CHANE LUNN ZOX:[id number] DOB: 01/24/1951 DOA: 01/03/2023     7 DOS: the patient was seen and examined on 01/10/2023   Brief hospital course: Mr. Davit Bretl is a 72 year old male with non-insulin-dependent diabetes mellitus, hyperlipidemia, history of DVT on Xarelto, oropharyngeal squamous cell carcinoma stage IVa, who presents emergency department from oncology clinic for chief concerns of syncope event at home while he was in the shower. High sensitive troponin is 2925.  Telemetry showed atrial flutter, he was initially given diltiazem gtt., and quickly converted to sinus. Patient also had a significant dysphagia from oropharyngeal cancer, PEG tube is placed on 5/17, TF started.  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  IV heparin started.    Principal Problem:   NSTEMI (non-ST elevated myocardial infarction) (HCC) Active Problems:   Oropharyngeal cancer (HCC)   History of DVT (deep vein thrombosis)   Neoplasm related pain   Dysphagia   Diabetes mellitus type 2, noninsulin dependent (HCC)   Protein-calorie malnutrition, severe (HCC)   Benign essential tremor   Atrial flutter (HCC)   Hypotension   Syncope and collapse   Palliative care encounter   Hypomagnesemia   Hypokalemia   LV (left ventricular) mural thrombus   Chronic systolic CHF (congestive heart failure) (HCC)   Thrombocythemia   Poor nutrition   Dilated cardiomyopathy (HCC)   Pancytopenia (HCC)   Assessment and Plan: New onset atrial flutter. Chronic systolic congestive heart failure. Elevated troponin secondary to atrial flutter and congestive heart failure. LV thrombus. Patient so far has converted to sinus rhythm, currently does not seem to have any volume overload.  Patient has been evaluated by cardiology, on heparin drip.   Pending heart cath today.   Syncope and collapse. Most likely secondary to atrial flutter and  drop of blood pressure.  Continue telemetry monitoring.   stage IVa oropharyngeal squamous cell carcinoma on on concurrent chemoradiation  Dysphagia secondary to oropharyngeal cancer. Severe protein calorie malnutrition secondary dysphagia. PEG tube was placed on 5/17, tube feeding started on 5/18. Patient has been tolerating tube feeding well. Received radiation therapy today.   Type 2 diabetes Diabetes still controlled.  Continue to follow.   Hypokalemia Hypomagnesemia. Recheck labs tomorrow.   Chronic thrombocytopenia. Pancytopenia. Continue to follow.      Subjective:  Patient doing well today, no abdominal pain nausea vomiting.  Physical Exam: Vitals:   01/10/23 0319 01/10/23 0743 01/10/23 1135 01/10/23 1246  BP: 104/73 100/67 108/66 104/69  Pulse: 71 69 66 67  Resp: 18 18 18 15   Temp: 98.4 F (36.9 C) 97.8 F (36.6 C) (!) 97.5 F (36.4 C) 98.6 F (37 C)  TempSrc: Oral Oral Oral Oral  SpO2: 98% 98% 98% 99%  Weight: 75.5 kg     Height:       General exam: Appears calm and comfortable  Respiratory system: Clear to auscultation. Respiratory effort normal. Cardiovascular system: S1 & S2 heard, RRR. No JVD, murmurs, rubs, gallops or clicks. No pedal edema. Gastrointestinal system: Abdomen is nondistended, soft and nontender. No organomegaly or masses felt. Normal bowel sounds heard. Central nervous system: Alert and oriented. No focal neurological deficits. Extremities: Symmetric 5 x 5 power. Skin: No rashes, lesions or ulcers Psychiatry: Judgement and insight appear normal. Mood & affect appropriate.    Data Reviewed:  Lab results reviewed.  Family Communication: None  Disposition: Status is: Inpatient Remains inpatient appropriate because: Severity of disease, IV treatment.  Time spent: 35 minutes  Author: Marrion Coy, MD 01/10/2023 12:53 PM  For on call review [email or web address].

## 2023-01-10 NOTE — Telephone Encounter (Signed)
Spoke with his sister and let her know we cancelled patients upcoming appointment. Told her when he is ready to return to have his PCP send [place] a new SLP referral.

## 2023-01-10 NOTE — Progress Notes (Addendum)
Nutrition Follow-up  DOCUMENTATION CODES:   Severe malnutrition in context of chronic illness  INTERVENTION:   -Transition to bolus feedings:  415 ml (1.75 cans) Osmolite 1.5 4 times daily per PEG (total volume of 7 cans per day)  100 ml free water flush before and after each feeding administration  Tube feeding regimen provides 2485 kcal (100% of needs), 104 grams of protein, and 1267 ml of H2O.  Total free water: 2067 ml daily  NUTRITION DIAGNOSIS:   Severe Malnutrition related to chronic illness (squamous cell carcinoma) as evidenced by percent weight loss, energy intake < or equal to 75% for > or equal to 1 month, severe fat depletion, moderate fat depletion, moderate muscle depletion, severe muscle depletion.  Ongoing  GOAL:   Patient will meet greater than or equal to 90% of their needs  Met with TF  MONITOR:   PO intake, Other (Comment)  REASON FOR ASSESSMENT:   Consult Assessment of nutrition requirement/status, Enteral/tube feeding initiation and management  ASSESSMENT:   Pt with non-insulin-dependent diabetes mellitus, hyperlipidemia, history of DVT on Xarelto, oropharyngeal squamous cell carcinoma stage IVa, who presents for chief concerns of syncope event at home while he was in the shower.  5/17- s/p PEG, TF initiated  Reviewed I/O's: +647 ml x 24 hours and +3.2 L since admission  UOP: 1.6 L x 24 hours   Plan for rt/lt heart cath and coronary angiography today.   Pt underwent radiation today.   Attempted to speak with pt x 2, however, pt out of room at times of visits.   Pt tolerating TF well and electrolytes WDL. TOC consult pending for home TF needs. RD will transition to bolus feedings tomorrow in anticipation for discharge.   Case discussed with TOC, RN, and MD.   Medications reviewed and include thiamine.   Labs reviewed: K, Mg, and Phos WDL. CBGS: 122.   Diet Order:   Diet Order             Diet NPO time specified Except for: Sips  with Meds  Diet effective midnight                   EDUCATION NEEDS:   Education needs have been addressed  Skin:  Skin Assessment: Reviewed RN Assessment  Last BM:  01/05/23  Height:   Ht Readings from Last 1 Encounters:  01/07/23 5\' 9"  (1.753 m)    Weight:   Wt Readings from Last 1 Encounters:  01/10/23 75.5 kg    Ideal Body Weight:  72.7 kg  BMI:  Body mass index is 24.58 kg/m.  Estimated Nutritional Needs:   Kcal:  2200-2400  Protein:  100-115 grams  Fluid:  2.0-2.2 L    Levada Schilling, RD, LDN, CDCES Registered Dietitian II Certified Diabetes Care and Education Specialist Please refer to [facility] for RD and/or RD on-call/weekend/after hours pager

## 2023-01-10 NOTE — Interval H&P Note (Signed)
History and Physical Interval Note:  01/10/2023 2:08 PM  Kenneth Gilmore  has presented today for surgery, with the diagnosis of non-ST segment myocardial infarction.  The various methods of treatment have been discussed with the patient and family. After consideration of risks, benefits and other options for treatment, the patient has consented to  Procedure(s): RIGHT/LEFT HEART CATH AND CORONARY ANGIOGRAPHY (N/A) as a surgical intervention.  The patient's history has been reviewed, patient examined, no change in status, stable for surgery.  I have reviewed the patient's chart and labs.  Questions were answered to the patient's satisfaction.     [name]

## 2023-01-10 NOTE — Progress Notes (Signed)
PT Cancellation Note  Patient Details Name: TYNELL HOLLINGHEAD MRN: [id number] DOB: 06-25-1951   Cancelled Treatment:    Reason Eval/Treat Not Completed: Other (comment). Chart reviewed. Pt out of room for radiation and then has plans for B heart cath today. Will re-attempt when available.   Korina Tretter 01/10/2023, 10:55 AM Elizabeth Palau, PT, DPT, GCS [phone]

## 2023-01-10 NOTE — Care Management Important Message (Signed)
Important Message  Patient Details  Name: Kenneth Gilmore MRN: [id number] Date of Birth: [birth year]   Medicare Important Message Given:  Yes     [name] 01/10/2023, 11:35 AM

## 2023-01-11 ENCOUNTER — Encounter: Payer: Self-pay | Admitting: Cardiovascular Disease

## 2023-01-11 ENCOUNTER — Other Ambulatory Visit (HOSPITAL_COMMUNITY): Payer: Self-pay

## 2023-01-11 ENCOUNTER — Encounter: Payer: Self-pay | Admitting: Oncology

## 2023-01-11 ENCOUNTER — Telehealth (HOSPITAL_COMMUNITY): Payer: Self-pay | Admitting: Pharmacy Technician

## 2023-01-11 ENCOUNTER — Other Ambulatory Visit: Payer: Self-pay

## 2023-01-11 ENCOUNTER — Ambulatory Visit: Payer: Medicare Other

## 2023-01-11 ENCOUNTER — Ambulatory Visit: Payer: Medicare Other | Admitting: Speech Pathology

## 2023-01-11 DIAGNOSIS — F17220 Nicotine dependence, chewing tobacco, uncomplicated: Secondary | ICD-10-CM | POA: Diagnosis not present

## 2023-01-11 DIAGNOSIS — I214 Non-ST elevation (NSTEMI) myocardial infarction: Secondary | ICD-10-CM | POA: Diagnosis not present

## 2023-01-11 DIAGNOSIS — I4892 Unspecified atrial flutter: Secondary | ICD-10-CM | POA: Diagnosis not present

## 2023-01-11 DIAGNOSIS — C109 Malignant neoplasm of oropharynx, unspecified: Secondary | ICD-10-CM | POA: Diagnosis not present

## 2023-01-11 DIAGNOSIS — Z510 Encounter for antineoplastic radiation therapy: Secondary | ICD-10-CM | POA: Diagnosis not present

## 2023-01-11 LAB — CBC
HCT: 28.6 % — ABNORMAL LOW (ref 39.0–52.0)
Hemoglobin: 10.1 g/dL — ABNORMAL LOW (ref 13.0–17.0)
MCH: 32.4 pg (ref 26.0–34.0)
MCHC: 35.3 g/dL (ref 30.0–36.0)
MCV: 91.7 fL (ref 80.0–100.0)
Platelets: 137 10*3/uL — ABNORMAL LOW (ref 150–400)
RBC: 3.12 MIL/uL — ABNORMAL LOW (ref 4.22–5.81)
RDW: 12.9 % (ref 11.5–15.5)
WBC: 2.7 10*3/uL — ABNORMAL LOW (ref 4.0–10.5)
nRBC: 0 % (ref 0.0–0.2)

## 2023-01-11 LAB — RAD ONC ARIA SESSION SUMMARY
Course Elapsed Days: 33
Plan Fractions Treated to Date: 18
Plan Prescribed Dose Per Fraction: 2 Gy
Plan Total Fractions Prescribed: 35
Plan Total Prescribed Dose: 70 Gy
Reference Point Dosage Given to Date: 36 Gy
Reference Point Session Dosage Given: 2 Gy
Session Number: 18

## 2023-01-11 LAB — GLUCOSE, CAPILLARY
Glucose-Capillary: 92 mg/dL (ref 70–99)
Glucose-Capillary: 91 mg/dL (ref 70–99)
Glucose-Capillary: 223 mg/dL — ABNORMAL HIGH (ref 70–99)

## 2023-01-11 LAB — BASIC METABOLIC PANEL
Anion gap: 11 (ref 5–15)
BUN: 11 mg/dL (ref 8–23)
CO2: 27 mmol/L (ref 22–32)
Calcium: 8.2 mg/dL — ABNORMAL LOW (ref 8.9–10.3)
Chloride: 99 mmol/L (ref 98–111)
Creatinine, Ser: 0.57 mg/dL — ABNORMAL LOW (ref 0.61–1.24)
GFR, Estimated: >60 mL/min (ref >60)
Glucose, Bld: 87 mg/dL (ref 70–99)
Potassium: 3.8 mmol/L (ref 3.5–5.1)
Sodium: 137 mmol/L (ref 135–145)

## 2023-01-11 LAB — HEPARIN LEVEL (UNFRACTIONATED): Heparin Unfractionated: 0.34 IU/mL (ref 0.30–0.70)

## 2023-01-11 LAB — PHOSPHORUS: Phosphorus: 3.7 mg/dL (ref 2.5–4.6)

## 2023-01-11 LAB — MAGNESIUM: Magnesium: 1.6 mg/dL — ABNORMAL LOW (ref 1.7–2.4)

## 2023-01-11 MED ORDER — ATORVASTATIN CALCIUM 80 MG PO TABS: 80.0000 mg | ORAL_TABLET | Freq: Every day | ORAL | 0 refills | Status: AC

## 2023-01-11 MED ORDER — OSMOLITE 1.5 CAL PO LIQD: 415.0000 mL | Freq: Three times a day (TID) | ORAL | 0 refills | Status: DC

## 2023-01-11 MED ORDER — EMPAGLIFLOZIN 10 MG PO TABS: 10.0000 mg | ORAL_TABLET | Freq: Every day | ORAL | 0 refills | Status: AC

## 2023-01-11 MED ORDER — PROSOURCE TF20 ENFIT COMPATIBL EN LIQD: 60.0000 mL | Freq: Two times a day (BID) | ENTERAL | Status: AC

## 2023-01-11 MED ORDER — METOPROLOL TARTRATE 25 MG PO TABS: 25.0000 mg | ORAL_TABLET | Freq: Two times a day (BID) | ORAL | 0 refills | Status: DC

## 2023-01-11 MED ORDER — MAGNESIUM SULFATE 2 GM/50ML IV SOLN
2.0000 g | Freq: Once | INTRAVENOUS | Status: AC
  Administered 2023-01-11: 2 g via INTRAVENOUS
  Filled 2023-01-11: qty 50

## 2023-01-11 MED ORDER — APIXABAN 5 MG PO TABS: 5.0000 mg | ORAL_TABLET | Freq: Two times a day (BID) | ORAL | 0 refills | Status: DC

## 2023-01-11 MED ORDER — OXYCODONE HCL 5 MG PO TABS: 5.0000 mg | ORAL_TABLET | Freq: Four times a day (QID) | ORAL | 0 refills | Status: AC | PRN

## 2023-01-11 MED ORDER — APIXABAN 5 MG PO TABS
5.0000 mg | ORAL_TABLET | Freq: Two times a day (BID) | ORAL | Status: DC
  Administered 2023-01-11: 5 mg via ORAL
  Filled 2023-01-11: qty 1

## 2023-01-11 MED ORDER — FREE WATER: 200.0000 mL | Freq: Three times a day (TID) | Status: AC

## 2023-01-11 MED ORDER — CLOPIDOGREL BISULFATE 75 MG PO TABS: 75.0000 mg | ORAL_TABLET | Freq: Every day | ORAL | 0 refills | Status: AC

## 2023-01-11 NOTE — Consult Note (Signed)
[facility] Customer service manager [facility]) Accountable Care Organization (ACO) [facility] Liaison Note  01/11/2023  Kenneth Gilmore 22-Jul-1951 [id number]  Location: [facility] RN Hospital Liaison screened the patient remotely at [facility].  Insurance: [employer]   Kenneth Gilmore is a 72 y.o. 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 The patient was screened for readmission hospitalization with noted high risk score for unplanned readmission risk with 1 IP in 6 months.  The patient was assessed for potential [facility]) Care Management service needs for post hospital transition for care coordination. Review of patient's electronic medical record reveals patient was admitted with NSTEMI. [facility] liaison spoke with pt concerning [facility] services. Pt declined services and verified [facility] at this [facility]. [facility] Liaison offered to provide magnet with [facility] contact however pt declined. Pt is also being followed closely by the Oncology team.    Plan: [facility] Liaison will continue to follow progress and disposition to asess for post hospital community care coordination/management needs.  Referral request for community care coordination: anticipate [facility] Transitions of Care Team follow up.   [facility] Care Management/Population Health does not replace or interfere with any arrangements made by the Inpatient Transition of Care team.   For questions contact:   Elliot Cousin, RN, BSN [facility] Liaison [place]   [facility]  Population Health Office Hours MTWF  8:00 am-6:00 pm Off on Thursday [phone] mobile [phone] [Office toll free line][facility] Office Hours are M-F 8:30 - 5 pm 24 hour nurse advise line [phone] [email or web address]  [email or web address]

## 2023-01-11 NOTE — TOC Transition Note (Addendum)
Transition of Care [facility]) - CM/SW Discharge Note   Patient Details  Name: Kenneth Gilmore MRN: [id number] Date of Birth: 1951/05/30  Transition of [facility]) CM/SW Contact:  Truddie Hidden, RN Phone Number: 01/11/2023, 12:05 PM   Clinical Narrative:    Spoke with patient at bedside regarding discharge home today. He has a ride home. He was advised requested for TF supplies had been requested via [employer]. He was also advised HH would be arranged for RN support. Patient advised the [facility] agency would contact him directly to arranged SOC.   Contacted Jessica from [employer] regarding when Osmolite and supplies. No time or estimated time of delivery is able to be provided.   12:08pm Referral for [facility] RN sent and accepted by [facility] from [place].   1:28pm Patient advised supplies for TF will be supplied via [employer].  He was also advised Danelle Earthly will provide [facility] and will call him soon to schedule his appointment.   TOC signing off.          Patient Goals and CMS Choice      Discharge Placement                         Discharge Plan and Services Additional resources added to the After Visit Summary for                                       Social Determinants of Health (SDOH) Interventions SDOH Screenings   Food Insecurity: No Food Insecurity (01/06/2023)  Housing: Low Risk  (01/06/2023)  Transportation Needs: No Transportation Needs (01/06/2023)  Utilities: Not At Risk (01/06/2023)  Financial Resource Strain: Low Risk  (12/27/2022)  Social Connections: Moderately Integrated (12/27/2022)  Stress: Stress Concern Present (12/27/2022)  Tobacco Use: High Risk (01/11/2023)     Readmission Risk Interventions     No data to display

## 2023-01-11 NOTE — Progress Notes (Signed)
Rounding Note    Patient Name: Kenneth Gilmore Date of Encounter: 01/11/2023  [facility] Cardiologist: None  New consult done by Dr. Kirke Corin Subjective   Patient seen on a.m. rounds.  Denies any chest pain, palpitations, shortness of breath, or dizziness.  He is status post flank tube placement 5/17 and recently has undergone a right and left heart catheterization on 5/20 with successful PCI/DES with 2 overlapping drug-eluting stents into the left circumflex extending to the ostium.  Inpatient Medications    Scheduled Meds:  aspirin EC  81 mg Oral Daily   atorvastatin  80 mg Oral Daily   clopidogrel  75 mg Oral Q breakfast   empagliflozin  10 mg Oral Daily   feeding supplement (OSMOLITE 1.5 CAL)  415 mL Per Tube TID PC & HS   feeding supplement (PROSource TF20)  60 mL Per Tube BID   free water  200 mL Per Tube TID PC & HS   metoprolol succinate  50 mg Oral Daily   neomycin-bacitracin-polymyxin  1 Application Topical Daily   QUEtiapine  25 mg Per Tube QHS   sodium chloride flush  3 mL Intravenous Q12H   thiamine  100 mg Per Tube Daily   Continuous Infusions:  sodium chloride     dextrose 5% lactated ringers 50 mL/hr at 01/11/23 0722   heparin 1,100 Units/hr (01/11/23 0722)   PRN Meds: sodium chloride, oxyCODONE, senna-docusate, sodium chloride flush   Vital Signs    Vitals:   01/10/23 2343 01/11/23 0401 01/11/23 0500 01/11/23 0750  BP: (!) 150/71 (!) 152/83  106/66  Pulse: 70 67  66  Resp: 18 18  20   Temp: 98.2 F (36.8 C) 98.1 F (36.7 C)  98.2 F (36.8 C)  TempSrc: Oral Oral    SpO2: 100% 98%  98%  Weight:   75 kg   Height:        Intake/Output Summary (Last 24 hours) at 01/11/2023 1031 Last data filed at 01/11/2023 1610 Gross per 24 hour  Intake 1481.36 ml  Output 1450 ml  Net 31.36 ml      01/11/2023    5:00 AM 01/10/2023    3:19 AM 01/09/2023    6:15 AM  Last 3 Weights  Weight (lbs) 165 lb 5.5 oz 166 lb 7.2 oz 170 lb 8 oz  Weight (kg) 75  kg 75.5 kg 77.338 kg      Telemetry    Sinus rhythm with occasional unifocal PVCs rates of 60-70- Personally Reviewed  ECG    No new tracings- Personally Reviewed  Physical Exam   GEN: No acute distress.   Neck: No JVD Cardiac: RRR, no murmurs, rubs, or gallops.  Right radial cath site 2+ radial pulse, side and gauze dressing clean, dry, intact, no bleeding bruising or hematoma Respiratory: Clear to auscultation bilaterally.  Respirations are unlabored on room air GI: Soft, mildly tender, non-distended, G-tube is intact and clamped MS: No edema; No deformity. Neuro:  Nonfocal  Psych: Normal affect   Labs    High Sensitivity Troponin:   Recent Labs  Lab 01/03/23 1032 01/03/23 1354 01/04/23 0910  TROPONINIHS 2,925* 3,672* 2,721*     Chemistry Recent Labs  Lab 01/09/23 0425 01/10/23 0518 01/10/23 1427 01/10/23 1428 01/10/23 1752 01/11/23 0550  NA 136 138   < > 137 118* 137  K 3.9 3.6   < > 3.9 3.1* 3.8  CL 101 97*  --   --   --  99  CO2 24 30  --   --   --  27  GLUCOSE 154* 179*  --   --   --  87  BUN 14 14  --   --   --  11  CREATININE 0.70 0.62  --   --   --  0.57*  CALCIUM 8.5* 8.6*  --   --   --  8.2*  MG 1.9 1.8  --   --   --  1.6*  GFRNONAA >60 >60  --   --   --  >60  ANIONGAP 11 11  --   --   --  11   < > = values in this interval not displayed.    Lipids  Recent Labs  Lab 01/05/23 0530  CHOL 138  TRIG 84  HDL 29*  LDLCALC 92  CHOLHDL 4.8    Hematology Recent Labs  Lab 01/08/23 0440 01/10/23 0518 01/10/23 1427 01/10/23 1428 01/10/23 1752 01/11/23 0550  WBC 5.1 2.8*  --   --   --  2.7*  RBC 3.30* 3.27*  --   --   --  3.12*  HGB 10.5* 10.6*   < > 9.5* 9.9* 10.1*  HCT 29.5* 29.9*   < > 28.0* 29.0* 28.6*  MCV 89.4 91.4  --   --   --  91.7  MCH 31.8 32.4  --   --   --  32.4  MCHC 35.6 35.5  --   --   --  35.3  RDW 12.5 12.7  --   --   --  12.9  PLT 94* 121*  --   --   --  137*   < > = values in this interval not displayed.   Thyroid  No results for input(s): "TSH", "FREET4" in the last 168 hours.  BNPNo results for input(s): "BNP", "PROBNP" in the last 168 hours.  DDimer No results for input(s): "DDIMER" in the last 168 hours.   Radiology      Cardiac Studies  [facility] 01/10/23   Ost RCA to Prox RCA lesion is 40% stenosed.   Mid RCA lesion is 40% stenosed.   RPAV lesion is 30% stenosed.   Mid LM to Dist LM lesion is 20% stenosed.   Mid LAD-2 lesion is 100% stenosed.   Mid LAD-1 lesion is 100% stenosed.   Lat 1st Diag lesion is 90% stenosed.   Prox Cx to Mid Cx lesion is 99% stenosed.   Ost Cx to Prox Cx lesion is 70% stenosed.   A drug-eluting stent was successfully placed using a STENT ONYX FRONTIER 2.25X38.   A drug-eluting stent was successfully placed using a STENT ONYX FRONTIER 2.5X12.   Post intervention, there is a 0% residual stenosis.   Post intervention, there is a 0% residual stenosis.   1.  Heavily calcified coronary arteries with severe two-vessel coronary artery disease.  Chronically occluded mid LAD stent with faint left to left collaterals.  Severe in-stent restenosis of the mid left circumflex as well as severe calcified stenosis proximally.  The RCA has moderate nonobstructive disease. 2.  Left ventricular angiography was not performed.  EF was moderately reduced by echo. 3.  Right heart catheterization showed low filling pressures, normal pulmonary pressure and normal cardiac output. 4.  Successful complex angioplasty and 2 overlapped drug-eluting stent placement to the left circumflex extending to the ostium.  Difficult procedure due to diffuse calcified disease and difficulty delivering stents.  An extension guide catheter had  to be used with predilation to high-pressure with a score flex balloon.   Recommendations: Treat the rest of coronary artery disease medically. The patient was loaded with clopidogrel which should be continued for at least 1 year.  Aspirin can be discontinued once the patient  is started on oral anticoagulation for LV thrombus and atrial flutter.  For now, will resume heparin drip in 4 hours.  TTE 01/03/23 1. There is a 3.3 x 1.7 cm mural thrombus near the left ventricular apex.  Left ventricular ejection fraction, by estimation, is 35 to 40%. The left  ventricle has moderately decreased function. The left ventricle  demonstrates regional wall motion  abnormalities (see scoring diagram/findings for description). Left  ventricular diastolic parameters are consistent with Grade I diastolic  dysfunction (impaired relaxation). There is akinesis of the left  ventricular, mid-apical anterior wall and  anteroseptal wall. There is akinesis of the left ventricular, apical  inferior segment. There is akinesis of the left ventricular, apical  segment.   2. Right ventricular systolic function is normal. The right ventricular  size is mildly enlarged. There is normal pulmonary artery systolic  pressure.   3. The mitral valve is abnormal. Trivial mitral valve regurgitation. No  evidence of mitral stenosis.   4. The aortic valve has an indeterminant number of cusps. There is  moderate thickening of the aortic valve. Aortic valve regurgitation is  trivial. Aortic valve sclerosis/calcification is present, without any  evidence of aortic stenosis.   5. The inferior vena cava is normal in size with greater than 50%  respiratory variability, suggesting right atrial pressure of 3 mmHg.    Patient Profile     72 y.o. 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, history of DVT previously on rivaroxaban  Assessment & Plan    NSTEMI -Underwent right and left heart catheterization on 01/10/2023 with 2 overlapping drug-eluting stents placed to the left circumflex extending into the ostium -He was  loaded on clopidogrel which she will continue clopidogrel 75 mg daily for minimum of the 1 year and aspirin can be discontinued once he is started on oral anticoagulation for his LV thrombus and atrial flutter -Heparin drip to be discontinued today and he will be placed on apixaban 5 mg twice daily -Remains chest pain-free -No ischemic changes noted on telemetry monitoring -Continued on atorvastatin 80 mg daily -EKG as needed for pain or changes  HFrEF with mural thrombus -LVEF 35-40% -Chronic appearing mural thrombus -Heparin drip to be discontinued initiated with apixaban 5 mg twice daily -He is continued on metoprolol XL 50 mg daily and Jardiance 10 mg daily -Continue to escalate GDMT as tolerated by blood pressure -Right heart catheterization revealed low filling pressures, normal pulmonary pressure and normal cardiac output, left heart catheterization resulted in 2 overlapping stents placed -Heart failure education -Daily weights, I's and O's, low-sodium diet  New onset atrial flutter -Currently in sinus rhythm -Serum potassium of 3.8 recommend keeping close to 4 but less than 5 -Transitioning to apixaban 5 mg twice daily for CHA2DS2-VASc score of at least 4 for stroke prophylaxis -Continued on Toprol-XL  -Continuous telemetry monitoring  Stage IVa oropharyngeal squamous cell carcinoma -Status post G-tube placement on 5/17 with severe protein calorie malnutrition -Continued on chemoradiation -Management per IM and oncology  Hyperlipidemia -LDL 92 this admission with goal being less  than 70 possibly 55 -Atorvastatin increased to 80 mg daily -Follow-up LFTs and fasting lipid panel in the office in 2 months -Escalate lipid-lowering therapy as indicated to achieve target LDL  Anemia/thrombocytopenia -Hemoglobin stable at 10.1 and WBCs 2.7 -Overall stable     For questions or updates, please contact [facility] Please consult [email or web address] for contact info under      CHA2DS2-VASc Score = 4   This indicates a 4.8% annual risk of stroke. The patient's score is based upon: CHF History: 0 HTN History: 1 Diabetes History: 1 Stroke History: 0 Vascular Disease History: 1 Age Score: 1 Gender Score: 0        Signed, Nafis Farnan, NP  01/11/2023, 10:31 AM

## 2023-01-11 NOTE — TOC Benefit Eligibility Note (Signed)
Patient Advocate Encounter  Insurance verification completed.    The patient is currently admitted and upon discharge could be taking Eliquis 5 mg.  The current 30 day co-pay is $47.00.   The patient is insured through [employer] Medicare Part D   This test claim was processed through [facility]- copay amounts may vary at other pharmacies due to pharmacy/plan contracts, or as the patient moves through the different stages of their insurance plan.  Kenzley Ke, CPHT Pharmacy Patient Advocate Specialist [facility] Patient Advocate Team Direct Number: [phone]  Fax: [phone]       

## 2023-01-11 NOTE — Telephone Encounter (Signed)
Pharmacy Patient Advocate Encounter  Insurance verification completed.    The patient is insured through [employer] part D   The patient is currently admitted and ran test claims for the following: Eliquis.  Copays and coinsurance results were relayed to Inpatient clinical team.

## 2023-01-11 NOTE — Progress Notes (Signed)
ANTICOAGULATION CONSULT NOTE  Pharmacy Consult for Heparin Indication: NSTEMI/mural thrombus/hx of DVT/aflutter  No Known Allergies  Patient Measurements: Height: 5\' 9"  (175.3 cm) Weight: 75 kg (165 lb 5.5 oz) IBW/kg (Calculated) : 70.7 Heparin Dosing Weight: 78 kg  Vital Signs: Temp: 98.2 F (36.8 C) (05/21 0750) Temp Source: Oral (05/21 0401) BP: 106/66 (05/21 0750) Pulse Rate: 66 (05/21 0750)  Labs: Recent Labs    01/09/23 0425 01/10/23 0518 01/10/23 1427 01/10/23 1428 01/10/23 1752 01/11/23 0550  HGB  --  10.6*   < > 9.5* 9.9* 10.1*  HCT  --  29.9*   < > 28.0* 29.0* 28.6*  PLT  --  121*  --   --   --  137*  HEPARINUNFRC 0.34 0.31  --   --   --  0.34  CREATININE 0.70 0.62  --   --   --  0.57*   < > = values in this interval not displayed.     Estimated Creatinine Clearance: 83.5 mL/min (A) (by C-G formula based on SCr of 0.57 mg/dL (L)).  Assessment: 65 yoM w/ hx CAD s/p PCI and recently off rivaroxiban for hx of DVT, admitted with syncopal episode, new onset Afib and elevated troponins. Pharmacy has been consulted to assist with systemic heparin therapy. Hgb and plt are trending down. Per patient account, has been off xarelto for several weeks due to difficulty swallowing and [facility]. Pt is on asa 81. Echo showed possible mural thrombus. New onset aflutter (CHADSVASc 4)  Date/time APTT/HL Comments 5/18@0019  HL 0.34 Therapeutic 5/18@0854  HL 0.53 Therapeutic 5/19@ 0425 HL 0.34 Therapeutic 5/20@0518  HL 0.31 Therapeutic @ 1100 un/hr (cath, heparin resumed at 2300) 5/21@0550  HL 0.34 Therapeutic   Goal of Therapy:  Heparin level 0.3-0.7 units/ml Monitor platelets by anticoagulation protocol: Yes  Plan:  Patient s/p R/LHC with stent placement Will need transition to [facility] prior to discharge for LV thrombus treatment. Plan to continue heparin at current rate for now and discuss change to enoxaparin + warfarin with hospitalist. CBC daily while on  heparin  Kenneth Gilmore PharmD, BCPS 01/11/2023 7:57 AM

## 2023-01-11 NOTE — Discharge Summary (Signed)
Physician Discharge Summary   Patient: Kenneth Gilmore MRN: [id number] DOB: 06/29/1951  Admit date:     01/03/2023  Discharge date: 01/11/23  Discharge Physician: Marrion Coy   PCP: [facility]   Recommendations at discharge:   Follow-up PCP in 1 week. Follow-up with cardiology in 2 weeks.  Discharge Diagnoses: Principal Problem:   NSTEMI (non-ST elevated myocardial infarction) (HCC) Active Problems:   Oropharyngeal cancer (HCC)   History of DVT (deep vein thrombosis)   Neoplasm related pain   Dysphagia   Diabetes mellitus type 2, noninsulin dependent (HCC)   Protein-calorie malnutrition, severe (HCC)   Benign essential tremor   Atrial flutter (HCC)   Hypotension   Syncope and collapse   Palliative care encounter   Hypomagnesemia   Hypokalemia   LV (left ventricular) mural thrombus   Chronic systolic heart failure (HCC)   Thrombocythemia   Poor nutrition   Dilated cardiomyopathy (HCC)   Pancytopenia (HCC)  Resolved Problems:   * No resolved hospital problems. [place] [facility] Course: Kenneth Gilmore is a 72 year old male with non-insulin-dependent diabetes mellitus, hyperlipidemia, history of DVT on Xarelto, oropharyngeal squamous cell carcinoma stage IVa, who presents emergency department from oncology clinic for chief concerns of syncope event at home while he was in the shower. High sensitive troponin is 2925.  Telemetry showed atrial flutter, he was initially given diltiazem gtt., and quickly converted to sinus. Patient also had a significant dysphagia from oropharyngeal cancer, PEG tube is placed on 5/17, TF started.  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  IV heparin started. Patient had a heart cath performed on 5/20, showed multivessel disease.  2 drug-eluting stents were placed.  Patient is placed on Plavix.  Patient was also transitioned to Eliquis for LV thrombus. Patient has been tolerating  tube feeding, medically stable to be discharged.  Patient will be educated discharge, home health will also be arranged.  Assessment and Plan: New onset atrial flutter. Chronic systolic congestive heart failure. Elevated troponin secondary to atrial flutter and congestive heart failure. LV thrombus. Patient so far has converted to sinus rhythm, currently does not seem to have any volume overload.  Patient has been evaluated by cardiology, on heparin drip.   Patient was found to have multivessel disease on heart cath, drug-eluting stents placed.  Continue Plavix as well as Eliquis.   Syncope and collapse. Most likely secondary to atrial flutter and drop of blood pressure.  Continue telemetry monitoring.   stage IVa oropharyngeal squamous cell carcinoma on on concurrent chemoradiation  Dysphagia secondary to oropharyngeal cancer. Severe protein calorie malnutrition secondary dysphagia. PEG tube was placed on 5/17, tube feeding started on 5/18. Patient has been tolerating tube feeding well. Received radiation therapy.    Type 2 diabetes Resume home treatment.   Hypokalemia Hypomagnesemia. Potassium has normalized, give additional dose of IV magnesium sulfate.   Chronic thrombocytopenia. Pancytopenia. Stable/improving         Consultants: Cardiology Procedures performed: Heart cath, PEG tube, radiation therapy. Disposition: Home health Diet recommendation:  Discharge Diet Orders (From admission, onward)     Start     Ordered   01/11/23 0000  Diet general       Comments: NPO, tube feeding only   01/11/23 1112           NPO Tube feeding only DISCHARGE MEDICATION: Allergies as of 01/11/2023   No Known Allergies      Medication List  STOP taking these medications    prochlorperazine 10 MG tablet Commonly known as: COMPAZINE   sucralfate 1 GM/10ML suspension Commonly known as: Carafate       TAKE these medications    apixaban 5 MG Tabs  tablet Commonly known as: ELIQUIS Place 1 tablet (5 mg total) into feeding tube 2 (two) times daily.   atorvastatin 80 MG tablet Commonly known as: LIPITOR Place 1 tablet (80 mg total) into feeding tube daily. Start taking on: Jan 12, 2023   clopidogrel 75 MG tablet Commonly known as: PLAVIX Place 1 tablet (75 mg total) into feeding tube daily with breakfast. Start taking on: Jan 12, 2023   empagliflozin 10 MG Tabs tablet Commonly known as: JARDIANCE Take 1 tablet (10 mg total) by mouth daily. Start taking on: Jan 12, 2023   feeding supplement (OSMOLITE 1.5 CAL) Liqd Place 415 mLs into feeding tube 4 (four) times daily - after meals and at bedtime.   feeding supplement (PROSource TF20) liquid Place 60 mLs into feeding tube 2 (two) times daily.   free water Soln Place 200 mLs into feeding tube 4 (four) times daily - after meals and at bedtime.   metoprolol tartrate 25 MG tablet Commonly known as: LOPRESSOR Place 1 tablet (25 mg total) into feeding tube 2 (two) times daily.   oxyCODONE 5 MG immediate release tablet Commonly known as: Oxy IR/ROXICODONE Place 1 tablet (5 mg total) into feeding tube every 6 (six) hours as needed for severe pain. What changed: how to take this               Durable Medical Equipment  (From admission, onward)           Start     Ordered   01/10/23 1509  For home use only DME Tube feeding  Once       Comments: For home tube feeding:   415 ml (1.75 cans) Osmolite 1.5 4 times daily per PEG (total volume of 7 cans per day)  100 ml free water flush before and after each feeding administration  Tube feeding regimen provides 2485 kcal (100% of needs), 104 grams of protein, and 1267 ml of H2O. Total free water: 2067 ml daily   01/10/23 1508            Follow-up Information     [facility] Follow up in 1 week(s).   Contact information: [address] [place] [address] [phone]         Antonieta Iba, MD Follow up in 2 week(s).   Specialty: Cardiology Contact information: [address] [place] [address] [phone]                Discharge Exam: Ceasar Mons Weights   01/09/23 0615 01/10/23 0319 01/11/23 0500  Weight: 77.3 kg 75.5 kg 75 kg   General exam: Appears calm and comfortable  Respiratory system: Clear to auscultation. Respiratory effort normal. Cardiovascular system: S1 & S2 heard, RRR. No JVD, murmurs, rubs, gallops or clicks. No pedal edema. Gastrointestinal system: Abdomen is nondistended, soft and nontender. No organomegaly or masses felt. Normal bowel sounds heard. Central nervous system: Alert and oriented. No focal neurological deficits. Extremities: Symmetric 5 x 5 power. Skin: No rashes, lesions or ulcers Psychiatry: Judgement and insight appear normal. Mood & affect appropriate.    Condition at discharge: good  The results of significant diagnostics from this hospitalization (including imaging, microbiology, ancillary and laboratory) are listed below  for reference.   Imaging Studies: CARDIAC CATHETERIZATION  Result Date: 01/10/2023   Ost RCA to Prox RCA lesion is 40% stenosed.   Mid RCA lesion is 40% stenosed.   RPAV lesion is 30% stenosed.   Mid LM to Dist LM lesion is 20% stenosed.   Mid LAD-2 lesion is 100% stenosed.   Mid LAD-1 lesion is 100% stenosed.   Lat 1st Diag lesion is 90% stenosed.   Prox Cx to Mid Cx lesion is 99% stenosed.   Ost Cx to Prox Cx lesion is 70% stenosed.   A drug-eluting stent was successfully placed using a STENT ONYX FRONTIER 2.25X38.   A drug-eluting stent was successfully placed using a STENT ONYX FRONTIER 2.5X12.   Post intervention, there is a 0% residual stenosis.   Post intervention, there is a 0% residual stenosis. 1.  Heavily calcified coronary arteries with severe two-vessel coronary artery disease.  Chronically occluded mid LAD stent with faint left to left collaterals.  Severe in-stent  restenosis of the mid left circumflex as well as severe calcified stenosis proximally.  The RCA has moderate nonobstructive disease. 2.  Left ventricular angiography was not performed.  EF was moderately reduced by echo. 3.  Right heart catheterization showed low filling pressures, normal pulmonary pressure and normal cardiac output. 4.  Successful complex angioplasty and 2 overlapped drug-eluting stent placement to the left circumflex extending to the ostium.  Difficult procedure due to diffuse calcified disease and difficulty delivering stents.  An extension guide catheter had to be used with predilation to high-pressure with a score flex balloon. Recommendations: Treat the rest of coronary artery disease medically. The patient was loaded with clopidogrel which should be continued for at least 1 year.  Aspirin can be discontinued once the patient is started on oral anticoagulation for LV thrombus and atrial flutter.  For now, will resume heparin drip in 4 hours.   IR GASTROSTOMY TUBE MOD SED  Result Date: 01/07/2023 INDICATION: 72 year old male with a history of dysphagia EXAM: PERC PLACEMENT GASTROSTOMY MEDICATIONS: 2 g Ancef; Antibiotics were administered within 1 hour of the procedure. ANESTHESIA/SEDATION: Versed 2.0 mg IV; Fentanyl 50 mcg IV Moderate Sedation Time:  12 minutes The patient was continuously monitored during the procedure by the interventional radiology nurse under my direct supervision. CONTRAST:  10 cc-administered into the gastric lumen. FLUOROSCOPY: Radiation Exposure Index (as provided by the fluoroscopic device): 13 mGy Kerma COMPLICATIONS: None PROCEDURE: Informed written consent was obtained from the patient and the patient's family after a thorough discussion of the procedural risks, benefits and alternatives. All questions were addressed. Maximal Sterile Barrier Technique was utilized including caps, mask, sterile gowns, sterile gloves, sterile drape, hand hygiene and skin  antiseptic. A timeout was performed prior to the initiation of the procedure. The epigastrium was prepped with Betadine in a sterile fashion, and a sterile drape was applied covering the operative field. A sterile gown and sterile gloves were used for the procedure. A 5-French orogastric tube is placed under fluoroscopic guidance. Scout imaging of the abdomen confirms barium within the transverse colon. The stomach was distended with gas. Under fluoroscopic guidance, an 18 gauge needle was utilized to puncture the anterior wall of the body of the stomach. An Amplatz wire was advanced through the needle passing a T fastener into the lumen of the stomach. The T fastener was secured for gastropexy. A 9-French sheath was inserted. A snare was advanced through the 9-French sheath. A Teena Dunk was advanced through the orogastric tube. It  was snared then pulled out the oral cavity, pulling the snare, as well. The leading edge of the gastrostomy was attached to the snare. It was then pulled down the esophagus and out the percutaneous site. Tube secured in place. Contrast was injected. Patient tolerated the procedure well and remained hemodynamically stable throughout. No complications were encountered and no significant blood loss encountered. IMPRESSION: Status post fluoroscopic placed percutaneous gastrostomy tube, with 20 [place] pull-through. Signed, Yvone Neu. Loreta Ave, DO [facility] Radiology Electronically Signed   By: Gilmer Mor D.O.   On: 01/07/2023 14:13   ECHOCARDIOGRAM COMPLETE  Result Date: 01/04/2023    ECHOCARDIOGRAM REPORT   Patient Name:   Kenneth Gilmore Date of Exam: 01/03/2023 Medical Rec #:  [id number]       Height:       69.0 in Accession #:    [id number]      Weight:       172.0 lb Date of Birth:  1951-04-10        BSA:          1.938 m Patient Age:    72 years        BP:           105/55 mmHg Patient Gender: M               HR:           76 bpm. Exam  Location:  [facility] Procedure: 2D Echo, Cardiac Doppler, Color Doppler and Intracardiac            Opacification Agent Indications:     I48.92 Atrial Flutter  History:         Patient has no prior history of Echocardiogram examinations.                  Risk Factors:Hypertension, Dyslipidemia and Diabetes.  Sonographer:     Daphine Deutscher RDCS Referring Phys:  [id number] AMY N COX Diagnosing Phys: Yvonne Kendall MD IMPRESSIONS  1. There is a 3.3 x 1.7 cm mural thrombus near the left ventricular apex. Left ventricular ejection fraction, by estimation, is 35 to 40%. The left ventricle has moderately decreased function. The left ventricle demonstrates regional wall motion abnormalities (see scoring diagram/findings for description). Left ventricular diastolic parameters are consistent with Grade I diastolic dysfunction (impaired relaxation). There is akinesis of the left ventricular, mid-apical anterior wall and anteroseptal wall. There is akinesis of the left ventricular, apical inferior segment. There is akinesis of the left ventricular, apical segment.  2. Right ventricular systolic function is normal. The right ventricular size is mildly enlarged. There is normal pulmonary artery systolic pressure.  3. The mitral valve is abnormal. Trivial mitral valve regurgitation. No evidence of mitral stenosis.  4. The aortic valve has an indeterminant number of cusps. There is moderate thickening of the aortic valve. Aortic valve regurgitation is trivial. Aortic valve sclerosis/calcification is present, without any evidence of aortic stenosis.  5. The inferior vena cava is normal in size with greater than 50% respiratory variability, suggesting right atrial pressure of 3 mmHg. FINDINGS  Left Ventricle: There is a 3.3 x 1.7 cm mural thrombus near the left ventricular apex. Left ventricular ejection fraction, by estimation, is 35 to 40%. The left ventricle has moderately decreased function. The left ventricle demonstrates  regional wall motion abnormalities. Definity contrast agent was given IV to delineate the left ventricular endocardial borders. The left ventricular internal cavity size was normal in size. There  is no left ventricular hypertrophy. Left ventricular diastolic parameters are consistent with Grade I diastolic dysfunction (impaired relaxation). Right Ventricle: The right ventricular size is mildly enlarged. No increase in right ventricular wall thickness. Right ventricular systolic function is normal. There is normal pulmonary artery systolic pressure. The tricuspid regurgitant velocity is 2.86  m/s, and with an assumed right atrial pressure of 3 mmHg, the estimated right ventricular systolic pressure is 35.7 mmHg. Left Atrium: Left atrial size was normal in size. Right Atrium: Right atrial size was normal in size. Pericardium: There is no evidence of pericardial effusion. Mitral Valve: The mitral valve is abnormal. There is mild thickening of the anterior mitral valve leaflet(s). Trivial mitral valve regurgitation. No evidence of mitral valve stenosis. Tricuspid Valve: The tricuspid valve is normal in structure. Tricuspid valve regurgitation is mild. Aortic Valve: The aortic valve has an indeterminant number of cusps. There is moderate thickening of the aortic valve. Aortic valve regurgitation is trivial. Aortic valve sclerosis/calcification is present, without any evidence of aortic stenosis. Aortic  valve mean gradient measures 6.5 mmHg. Aortic valve peak gradient measures 12.9 mmHg. Aortic valve area, by VTI measures 1.52 cm. Pulmonic Valve: The pulmonic valve was not well visualized. Pulmonic valve regurgitation is trivial. No evidence of pulmonic stenosis. Aorta: The aortic root and ascending aorta are structurally normal, with no evidence of dilitation. Pulmonary Artery: The pulmonary artery is of normal size. Venous: The inferior vena cava is normal in size with greater than 50% respiratory variability,  suggesting right atrial pressure of 3 mmHg. IAS/Shunts: The interatrial septum was not well visualized.  LEFT VENTRICLE PLAX 2D LVIDd:         4.80 cm   Diastology LVIDs:         3.70 cm   LV e' medial:    6.64 cm/s LV PW:         1.00 cm   LV E/e' medial:  11.9 LV IVS:        1.00 cm   LV e' lateral:   7.56 cm/s LVOT diam:     2.00 cm   LV E/e' lateral: 10.5 LV SV:         54 LV SV Index:   28 LVOT Area:     3.14 cm  RIGHT VENTRICLE             IVC RV Basal diam:  4.30 cm     IVC diam: 1.20 cm RV S prime:     18.20 cm/s TAPSE (M-mode): 2.8 cm LEFT ATRIUM             Index        RIGHT ATRIUM           Index LA diam:        4.50 cm 2.32 cm/m   RA Area:     10.70 cm LA Vol (A2C):   40.1 ml 20.70 ml/m  RA Volume:   24.10 ml  12.44 ml/m LA Vol (A4C):   53.3 ml 27.51 ml/m LA Biplane Vol: 46.0 ml 23.74 ml/m  AORTIC VALVE AV Area (Vmax):    1.55 cm AV Area (Vmean):   1.46 cm AV Area (VTI):     1.52 cm AV Vmax:           179.50 cm/s AV Vmean:          120.500 cm/s AV VTI:            0.359 m AV Peak Grad:  12.9 mmHg AV Mean Grad:      6.5 mmHg LVOT Vmax:         88.77 cm/s LVOT Vmean:        56.100 cm/s LVOT VTI:          0.173 m LVOT/AV VTI ratio: 0.48  AORTA Ao Root diam: 3.40 cm Ao Asc diam:  3.40 cm MITRAL VALVE                TRICUSPID VALVE MV Area (PHT): 3.42 cm     TR Peak grad:   32.7 mmHg MV Decel Time: 222 msec     TR Vmax:        286.00 cm/s MV E velocity: 79.20 cm/s MV A velocity: 111.00 cm/s  SHUNTS MV E/A ratio:  0.71         Systemic VTI:  0.17 m                             Systemic Diam: 2.00 cm Yvonne Kendall MD Electronically signed by Yvonne Kendall MD Signature Date/Time: 01/04/2023/10:24:53 AM    Final    DG Chest Port 1 View  Result Date: 01/03/2023 CLINICAL DATA:  Syncope EXAM: PORTABLE CHEST 1 VIEW COMPARISON:  X-ray 12/30/2022 FINDINGS: Hyperinflation. Normal cardiopericardial silhouette when adjusting for technique. No consolidation, pneumothorax, effusion or edema. Overlapping  cardiac leads. Degenerative changes of the spine. Fixation pin along the distal left clavicle. IMPRESSION: Hyperinflation.  No acute cardiopulmonary disease. Electronically Signed   By: Karen Kays M.D.   On: 01/03/2023 15:10   CT Head Wo Contrast  Result Date: 01/03/2023 CLINICAL DATA:  Trauma.  History of head neck cancer. EXAM: CT HEAD WITHOUT CONTRAST CT CERVICAL SPINE WITHOUT CONTRAST TECHNIQUE: Multidetector CT imaging of the head and cervical spine was performed following the standard protocol without intravenous contrast. Multiplanar CT image reconstructions of the cervical spine were also generated. RADIATION DOSE REDUCTION: This exam was performed according to the departmental dose-optimization program which includes automated exposure control, adjustment of the mA and/or kV according to patient size and/or use of iterative reconstruction technique. COMPARISON:  MR brain 03/29/2020 FINDINGS: CT HEAD FINDINGS Brain: No evidence of acute infarction, hemorrhage, hydrocephalus, extra-axial collection or mass lesion/mass effect. There is heterogeneous low-attenuation within the subcortical and periventricular white matter compatible with chronic microvascular disease. Vascular: No hyperdense vessel or unexpected calcification. Skull: Normal. Negative for fracture or focal lesion. Sinuses/Orbits: No acute finding. Other: None. CT CERVICAL SPINE FINDINGS Alignment: The alignment of the cervical spine is normal. Skull base and vertebrae: No signs of acute fracture or subluxation. Soft tissues and spinal canal: No prevertebral fluid or swelling. No visible canal hematoma. Disc levels: There are large, bulky, flowing ventral syndesmophytes throughout the cervical spine. Disc space narrowing and calcification identified at C3-4. There is also disc space narrowing at C5-6 and C7-T1. Mild bilateral facet arthropathy. Upper chest: Right base of tongue/oropharyngeal mass is again identified. This is only partially  visualized on the current study, image 39/4. Other: None IMPRESSION: 1. No evidence for acute intracranial abnormality. 2. Chronic microvascular disease. 3. No evidence for acute cervical spine fracture or subluxation. 4. 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 5. 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 This is only partially visualized on the current study. See PET-CT report from 11/15/2022 for details. Electronically Signed   By: Signa Kell M.D.   On: 01/03/2023 13:37   CT Cervical  Spine Wo Contrast  Result Date: 01/03/2023 CLINICAL DATA:  Trauma.  History of head neck cancer. EXAM: CT HEAD WITHOUT CONTRAST CT CERVICAL SPINE WITHOUT CONTRAST TECHNIQUE: Multidetector CT imaging of the head and cervical spine was performed following the standard protocol without intravenous contrast. Multiplanar CT image reconstructions of the cervical spine were also generated. RADIATION DOSE REDUCTION: This exam was performed according to the departmental dose-optimization program which includes automated exposure control, adjustment of the mA and/or kV according to patient size and/or use of iterative reconstruction technique. COMPARISON:  MR brain 03/29/2020 FINDINGS: CT HEAD FINDINGS Brain: No evidence of acute infarction, hemorrhage, hydrocephalus, extra-axial collection or mass lesion/mass effect. There is heterogeneous low-attenuation within the subcortical and periventricular white matter compatible with chronic microvascular disease. Vascular: No hyperdense vessel or unexpected calcification. Skull: Normal. Negative for fracture or focal lesion. Sinuses/Orbits: No acute finding. Other: None. CT CERVICAL SPINE FINDINGS Alignment: The alignment of the cervical spine is normal. Skull base and vertebrae: No signs of acute fracture or subluxation. Soft tissues and spinal canal: No prevertebral fluid or swelling. No visible canal hematoma. Disc levels: There are large, bulky,  flowing ventral syndesmophytes throughout the cervical spine. Disc space narrowing and calcification identified at C3-4. There is also disc space narrowing at C5-6 and C7-T1. Mild bilateral facet arthropathy. Upper chest: Right base of tongue/oropharyngeal mass is again identified. This is only partially visualized on the current study, image 39/4. Other: None IMPRESSION: 1. No evidence for acute intracranial abnormality. 2. Chronic microvascular disease. 3. No evidence for acute cervical spine fracture or subluxation. 4. 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 5. 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 This is only partially visualized on the current study. See PET-CT report from 11/15/2022 for details. Electronically Signed   By: Signa Kell M.D.   On: 01/03/2023 13:37   DG Chest 2 View  Result Date: 12/30/2022 CLINICAL DATA:  Provided history: Aspiration, weakness. EXAM: CHEST - 2 VIEW COMPARISON:  PET CT 11/15/2022. FINDINGS: Heart size within normal limits. No appreciable airspace consolidation. No evidence of pleural effusion or pneumothorax. No acute osseous abnormality identified. No acute osseous abnormality identified. Dextrocurvature of the thoracic spine. Metallic screw within the distal left clavicle. IMPRESSION: No evidence of active cardiopulmonary disease. Electronically Signed   By: Jackey Loge D.O.   On: 12/30/2022 10:04   DG SWALLOW FUNC OP MEDICARE SPEECH PATH  Result Date: 12/22/2022 Table formatting from the original result was not included. Modified Barium Swallow Study Patient Details Name: Kenneth Gilmore MRN: [id number] Date of Birth: 14-Mar-1951 Today's Date: 12/22/2022 HPI/PMH: HPI: Pt is a 72 year old male with past medical history of DVT who is currently undergoing concurrent chemoradiation treatment for at 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 Soft tissue neck - 09/30/2022  Infiltrating mass in the right tongue measuring   up  to 4 cm. Prominent submucosal involvement including in the right   parapharyngeal fat. Assessment of relationship to the pterygoids and   extrinsic tongue musculature is somewhat hindered by streak artifact   from dental amalgam.      US biopsy - 10/22/2022  Lymph node, right cervical:  Positive for malignancy; Metastatic Squamous Cell Carcinoma, Keratinizing, in a background of dense fibrosis and tumor necrosis  An immunohistochemical study directed against p16 is strongly and   diffusely positive, indicating an HPV driven malignancy. Clinical Impression: Pt presents severe oropharyngeal dysphagia when consuming thin liquids via cup, nectar thick liquids via cup and  puree.      His dysphagia is multifactorial in nature:  severe trismus - trials limited in study d/t inability to open mouth to receive spoon, graham cracker or barium tablet  increased pharyngeal weakness (BOT/hyolaryngeal weakness resulting in decreased epiglottic ) from chemoradiation for oropharyngeal cancer  tumor burden (see CT Soft Tissue Neck)  baseline structural dysphagia d/t the appearance of large osteophytes at C4-C5     As a result, pt presents with silent aspiration of thin liquids and nectar thick liquids and decreased intake of puree with ineffiecent pharyngeal clearing of puree resulting in multiple swallows. At this time, recommend pt be NPO with water protocol following oral care, intensive trismus and pharyngeal treatment with serious consideration for alternative means of nutrition to prevent further dehydration and malnutrition while undergoing cancer treatments. Factors that may increase risk of adverse event in presence of aspiration Rubye Oaks & Clearance Coots 2021): Factors that may increase risk of adverse event in presence of aspiration Rubye Oaks & Clearance Coots 2021): Frail or deconditioned; Inadequate oral hygiene; Reduced saliva; Weak cough (orophrayngeal mass, radiation treatment for oropharyngeal mass, weakness/deconditioning d/t weight  loss associated with severe trismus; severe trismus) Recommendations/Plan: Swallowing Evaluation Recommendations Swallowing Evaluation Recommendations Recommendations: NPO; Free water protocol after oral care; Alternative means of nutrition - G Tube Medication Administration: Via alternative means Supervision: Patient able to self-feed Oral care recommendations: Oral care QID (4x/day) Recommended consults: Consider GI consultation (for G-Tube placement) Treatment Plan Treatment Plan Treatment recommendations: Other (comment) (continue Outpatient ST services) Follow-up recommendations: Outpatient SLP Functional status assessment: Patient has had a recent decline in their functional status and demonstrates the ability to make significant improvements in function in a reasonable and predictable amount of time. Recommendations Recommendations for follow up therapy are one component of a multi-disciplinary discharge planning process, led by the attending physician.  Recommendations may be updated based on patient status, additional functional criteria and insurance authorization. Assessment: Orofacial Exam: Orofacial Exam Oral Cavity: Oral Hygiene: WFL Oral Cavity - Dentition: Adequate natural dentition Orofacial Anatomy: WFL Anatomy: Anatomy: -- (Large appearing osteophytes at C4-C5) Boluses Administered: Boluses Administered Boluses Administered: Thin liquids (Level 0); Mildly thick liquids (Level 2, nectar thick); Puree  Oral Impairment Domain: Oral Impairment Domain Lip Closure: No labial escape Tongue control during bolus hold: Cohesive bolus between tongue to palatal seal Bolus preparation/mastication: Disorganized chewing/mashing with solid pieces of bolus unchewed (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) Bolus transport/lingual motion: Brisk tongue motion; Repetitive/disorganized tongue motion Oral residue: Trace residue lining oral structures Location of oral residue : Tongue Initiation of  pharyngeal swallow : Valleculae; Pyriform sinuses  Pharyngeal Impairment Domain: Pharyngeal Impairment Domain Soft palate elevation: No bolus between soft palate (SP)/pharyngeal wall (PW) Laryngeal elevation: Partial superior movement of thyroid cartilage/partial approximation of arytenoids to epiglottic petiole Anterior hyoid excursion: Partial anterior movement Epiglottic movement: Partial inversion Laryngeal vestibule closure: Incomplete, narrow column air/contrast in laryngeal vestibule Pharyngeal stripping wave : Present - diminished Pharyngoesophageal segment opening: Partial distention/partial duration, partial obstruction of flow (suspect that large osetophytes play role in clearance thru PE segment) Tongue base retraction: Trace column of contrast or air between tongue base and PPW Pharyngeal residue: Collection of residue within or on pharyngeal structures; Majority of contrast within or on pharyngeal structures Location of pharyngeal residue: Valleculae; Pharyngeal wall; Pyriform sinuses; Diffuse (>3 areas)  Esophageal Impairment Domain: No data recorded Pill: No data recorded Penetration/Aspiration Scale Score: Penetration/Aspiration Scale Score 3.  Material enters airway, remains ABOVE vocal cords and not ejected out:  Puree 8.  Material enters airway, passes BELOW cords without attempt by patient to eject out (silent aspiration) : Thin liquids (Level 0); Mildly thick liquids (Level 2, nectar thick) Compensatory Strategies: No data recorded  General Information: Caregiver present: No  Diet Prior to this Study: Dysphagia 1 (pureed); Thin liquids (Level 0)   Temperature : Normal   Respiratory Status: WFL   Supplemental O2: None (Room air)   History of Recent Intubation: No  Behavior/Cognition: Alert; Cooperative; Pleasant mood (emotional d/t recent staging of cancer) Self-Feeding Abilities: Able to self-feed Baseline vocal quality/speech: Abnormal resonance (Wet; low vocal intensity) Volitional Cough: Able  to elicit Volitional Swallow: Unable to elicit (attempted but unable to complete swallow) Exam Limitations: Other (comment) (bolus adminstration and bolus consistencies limited by severe trismus) Goal Planning: Prognosis for improved oropharyngeal function: Fair Barriers to Reach Goals: Overall medical prognosis; Severity of deficits; Medication (chemo, radiation) No data recorded No data recorded Consulted and agree with results and recommendations: Patient Pain: Pain Assessment Pain Assessment: 0-10 Pain Score: 8 Pain Location: jaw, tongue Pain Descriptors / Indicators: Aching; Constant; Contraction Pain Intervention(s): Limited activity within patient's tolerance; Monitored during session End of Session: Start Time:SLP Start Time (ACUTE ONLY): 1230 Stop Time: SLP Stop Time (ACUTE ONLY): 1300 Time Calculation:SLP Time Calculation (min) (ACUTE ONLY): 30 min Charges: SLP Evaluations $ SLP Speech Visit: 1 Visit SLP Evaluations $MBS Swallow: 1 Procedure SLP visit diagnosis: SLP Visit Diagnosis: Dysphagia, oropharyngeal phase (R13.12) Past Medical History: Past Medical History: Diagnosis Date  Diabetes mellitus without complication (HCC)   Hyperlipidemia   Hypertension  Past Surgical History: Past Surgical History: Procedure Laterality Date  arm surgery Left   CORONARY ANGIOPLASTY WITH STENT PLACEMENT    left side rotator cuff surgery    SHOULDER SURGERY   Happi Overton 12/22/2022, 3:55 PM CLINICAL DATA:  Patient with a history of oropharyngeal squamous cell carcinoma. Patient presents today with complaints of dysphagia, throat pain. EXAM: MODIFIED BARIUM SWALLOW TECHNIQUE: Different consistencies of barium were administered orally to the patient by the Speech Pathologist. Imaging of the pharynx was performed in the lateral projection. Alwyn Ren, NP was present in the fluoroscopy room during this study, which was supervised and interpreted by Dr. Grace Isaac. FLUOROSCOPY: Radiation Exposure Index (as provided by the  fluoroscopic device): 5.20 mGy Kerma COMPARISON:  Neck CT-09/28/2022 FINDINGS: Vestibular  Penetration:  Observed with all consistencies of barium. Aspiration:  Observed with thin and nectar thick barium. Other: Note is made of prominent anteriorly directed syndesmophytes within mid and inferior aspects of the cervical spine as demonstrated on recent neck CT IMPRESSION: Vestibular penetration with all consistencies of barium. Aspiration observed with thin and nectar thick barium. Read by: Alwyn Ren, NP Please refer to the Speech Pathologists report for complete details and recommendations. Electronically Signed   By: Simonne Come M.D.   On: 12/21/2022 13:38   Microbiology: No results found for this or any previous visit.  Labs: CBC: Recent Labs  Lab 01/07/23 0535 01/08/23 0019 01/08/23 0440 01/10/23 0518 01/10/23 1427 01/10/23 1428 01/10/23 1752 01/11/23 0550  WBC 3.2* 5.4 5.1 2.8*  --   --   --  2.7*  HGB 11.2* 10.4* 10.5* 10.6* 9.5* 9.5* 9.9* 10.1*  HCT 31.3* 29.1* 29.5* 29.9* 28.0* 28.0* 29.0* 28.6*  MCV 89.7 89.8 89.4 91.4  --   --   --  91.7  PLT 92* 97* 94* 121*  --   --   --  137*   Basic Metabolic Panel: Recent  Labs  Lab 01/07/23 0535 01/08/23 0440 01/09/23 0425 01/10/23 0518 01/10/23 1427 01/10/23 1428 01/10/23 1752 01/11/23 0550  NA 139 137 136 138 137 137 118* 137  K 3.3* 3.4* 3.9 3.6 3.9 3.9 3.1* 3.8  CL 103 102 101 97*  --   --   --  99  CO2 25 23 24 30   --   --   --  27  GLUCOSE 103* 107* 154* 179*  --   --   --  87  BUN 10 10 14 14   --   --   --  11  CREATININE 0.65 0.70 0.70 0.62  --   --   --  0.57*  CALCIUM 8.4* 8.2* 8.5* 8.6*  --   --   --  8.2*  MG 1.7 1.5* 1.9 1.8  --   --   --  1.6*  PHOS 2.9 3.4 3.1 3.6  --   --   --  3.7   Liver Function Tests: No results for input(s): "AST", "ALT", "ALKPHOS", "BILITOT", "PROT", "ALBUMIN" in the last 168 hours. CBG: Recent Labs  Lab 01/10/23 0926 01/10/23 2202 01/10/23 2345 01/11/23 0403 01/11/23 0753   GLUCAP 122* 76 140* 92 91    Discharge time spent: greater than 30 minutes.  Signed: Marrion Coy, MD [facility] 01/11/2023

## 2023-01-11 NOTE — Progress Notes (Signed)
Nutrition Follow-up  DOCUMENTATION CODES:   Severe malnutrition in context of chronic illness  INTERVENTION:   -TF via PEG:   415 ml (1.75 cans) Osmolite 1.5 4 times daily per PEG (total volume of 7 cans per day)   100 ml free water flush before and after each feeding administration   Tube feeding regimen provides 2485 kcal (100% of needs), 104 grams of protein, and 1267 ml of H2O.  Total free water: 2067 ml daily  -Continue 100 mg thiamine via tube  NUTRITION DIAGNOSIS:   Severe Malnutrition related to chronic illness (squamous cell carcinoma) as evidenced by percent weight loss, energy intake < or equal to 75% for > or equal to 1 month, severe fat depletion, moderate fat depletion, moderate muscle depletion, severe muscle depletion.  Ongoing  GOAL:   Patient will meet greater than or equal to 90% of their needs  Met with TF  MONITOR:   PO intake, Other (Comment)  REASON FOR ASSESSMENT:   Consult Assessment of nutrition requirement/status, Enteral/tube feeding initiation and management  ASSESSMENT:   Pt with non-insulin-dependent diabetes mellitus, hyperlipidemia, history of DVT on Xarelto, oropharyngeal squamous cell carcinoma stage IVa, who presents for chief concerns of syncope event at home while he was in the shower.  5/17- s/p PEG, TF initiated 5/20- s/p  rt/lt heart cath and coronary angiography   Reviewed I/O's: -26 ml x 24 hours and +3.2 L since admission  UOP: 1.5 L x 24 hours  Case discussed with RN, MD, and TOC. Pt will discharge home today. RN to provide TF education. Per RN, pt is requesting to speak with RD prior to discharge.   Spoke with pt, who was sitting in recliner chair at time of visit. Pt looks better overall and is much more animated compared to when RD last him. Pt denies any nausea, vomiting, or abdominal pain with his tube feedings (per RN, pt also tolerated AM feed without difficulty). Pt shares that he still feels a little distressed  about having feeding tube ("it was not part of the plan"), but understands the importance of it to help improve his nutritional status as well as support him through cancer treatment. He desires to continue TF, but is hopeful that there will be a time where he will no longer need to depend on PEG tube.   RD validated pt feelings and also shared that nutritional care of plan may change, however, he is currently being prescribed 100% of his nutritional needs via TF due to extremely poor oral intake and concerns for silent aspiration per MBSS completed as an outpatient earlier this month. Discussed that TF may potentially be decreased once eating better, but ideally pt would need to demonstrate consistent sufficient oral intake prior to tube being removed if this remains within his goals of care.   Reviewed TF regimen for home with pt. Pt verbalized understanding and desires to continue to follow with cancer center RD.   RD provided 3 day supply of TF to pt room. Per TOC, unsure when next TF shipment will be delivered. Also notified cancer center RD of discharge.   Medications reviewed and include thiamine and dextrose 5% in lactated ringers infusion @ 50 ml/hr.   Labs reviewed: Mg: 1.6, CBGS: 91-223 (inpatient orders for glycemic control are none).    Diet Order:   Diet Order             Diet NPO time specified Except for: Ice Chips  Diet effective now  Diet general                   EDUCATION NEEDS:   Education needs have been addressed  Skin:  Skin Assessment: Reviewed RN Assessment  Last BM:  01/05/23  Height:   Ht Readings from Last 1 Encounters:  01/07/23 5\' 9"  (1.753 m)    Weight:   Wt Readings from Last 1 Encounters:  01/11/23 75 kg    Ideal Body Weight:  72.7 kg  BMI:  Body mass index is 24.42 kg/m.  Estimated Nutritional Needs:   Kcal:  2200-2400  Protein:  100-115 grams  Fluid:  2.0-2.2 L    Levada Schilling, RD, LDN, CDCES Registered Dietitian  II Certified Diabetes Care and Education Specialist Please refer to [facility] for RD and/or RD on-call/weekend/after hours pager

## 2023-01-11 NOTE — Evaluation (Signed)
Physical Therapy Evaluation Patient Details Name: Kenneth Gilmore MRN: [id number] DOB: September 01, 1950 Today's Date: 01/11/2023  History of Present Illness  Pt admitted for NSTEMI and is s/p radial cath on 01/10/23. Pt with history of oraopharyngeal cancer currently on chemo, CAD s/p PCI, HTN, HLD, and DM. Pt also with PEG tube this admission.  Clinical Impression  Pt is a pleasant 72 year old male who was admitted for NSTEMI and is s/p radial cath with stent placement. Pt performs bed mobility/transfers with cga with HHA. Will continue to progress ambulation in next session. Broken wing protocol. Pt demonstrates deficits with strength/mobility/balance. Would benefit from skilled PT to address above deficits and promote optimal return to PLOF. Pt will continue to receive skilled PT services while admitted and will defer to TOC/care team for updates regarding disposition planning.      Recommendations for follow up therapy are one component of a multi-disciplinary discharge planning process, led by the attending physician.  Recommendations may be updated based on patient status, additional functional criteria and insurance authorization.  Follow Up Recommendations       Assistance Recommended at Discharge Intermittent Supervision/Assistance  Patient can return home with the following  A little help with walking and/or transfers;A little help with bathing/dressing/bathroom;Help with stairs or ramp for entrance    Equipment Recommendations None recommended by PT  Recommendations for Other Services       Functional Status Assessment Patient has had a recent decline in their functional status and demonstrates the ability to make significant improvements in function in a reasonable and predictable amount of time.     Precautions / Restrictions Precautions Precautions: Fall Restrictions Weight Bearing Restrictions: No      Mobility  Bed Mobility Overal bed mobility: Needs Assistance Bed  Mobility: Supine to Sit     Supine to sit: Min guard     General bed mobility comments: safe technique, need cues for sequencing and uses bed function to exit bed.    Transfers Overall transfer level: Needs assistance Equipment used: 1 person hand held assist Transfers: Bed to chair/wheelchair/BSC     Step pivot transfers: Min guard       General transfer comment: able to take a few steps over to recliner. Will plan to progress ambulation next session    Ambulation/Gait                  Stairs            Wheelchair Mobility    Modified Rankin (Stroke Patients Only)       Balance Overall balance assessment: Needs assistance, History of Falls Sitting-balance support: Feet supported Sitting balance-Leahy Scale: Good     Standing balance support: Single extremity supported Standing balance-Leahy Scale: Fair                               Pertinent Vitals/Pain Pain Assessment Pain Assessment: No/denies pain    Home Living Family/patient expects to be discharged to:: Private residence Living Arrangements: Alone Available Help at Discharge: Family;Available PRN/intermittently Type of Home: House Home Access: Stairs to enter Entrance Stairs-Rails: None (said family has since installed railings, but he isn't sure where they are) Secretary/administrator of Steps: 3   Home Layout: One level Home Equipment: None Additional Comments: reports his family in process of obtaining RW, denies need for further equipment    Prior Function Prior Level of Function : Independent/Modified Independent;Driving;History of Falls (  last six months)             Mobility Comments: was recently having trouble with mobility, 1 recent fall and had recently stopped driving. Reports his sisters are available to help as needed at discharge ADLs Comments: indep     Hand Dominance        Extremity/Trunk Assessment   Upper Extremity Assessment Upper  Extremity Assessment:  (R side not tested due to recent heart cath/ L UE WNL)    Lower Extremity Assessment Lower Extremity Assessment: Generalized weakness (B LE grossly 3+/5)       Communication   Communication: No difficulties  Cognition Arousal/Alertness: Awake/alert Behavior During Therapy: WFL for tasks assessed/performed Overall Cognitive Status: Within Functional Limits for tasks assessed                                 General Comments: eager to participate        General Comments      Exercises     Assessment/Plan    PT Assessment Patient needs continued PT services  PT Problem List Decreased strength;Decreased balance;Decreased mobility       PT Treatment Interventions DME instruction;Gait training;Stair training;Therapeutic exercise    PT Goals (Current goals can be found in the Care Plan section)  Acute Rehab PT Goals Patient Stated Goal: to go home PT Goal Formulation: With patient Time For Goal Achievement: 01/25/23 Potential to Achieve Goals: Good    Frequency Min 3X/week     Co-evaluation               AM-PAC PT "6 Clicks" Mobility  Outcome Measure Help needed turning from your back to your side while in a flat bed without using bedrails?: A Little Help needed moving from lying on your back to sitting on the side of a flat bed without using bedrails?: A Little Help needed moving to and from a bed to a chair (including a wheelchair)?: A Little Help needed standing up from a chair using your arms (e.g., wheelchair or bedside chair)?: A Little Help needed to walk in hospital room?: A Lot Help needed climbing 3-5 steps with a railing? : A Lot 6 Click Score: 16    End of Session Equipment Utilized During Treatment: Gait belt Activity Tolerance: Patient tolerated treatment well Patient left: in chair;with chair alarm set Nurse Communication: Mobility status PT Visit Diagnosis: Unsteadiness on feet (R26.81);Muscle weakness  (generalized) (M62.81);Difficulty in walking, not elsewhere classified (R26.2)    Time: 1610-9604 PT Time Calculation (min) (ACUTE ONLY): 14 min   Charges:   PT Evaluation $PT Eval Low Complexity: 1 Low          Elizabeth Palau, PT, DPT, GCS [phone]   Aydan Levitz 01/11/2023, 12:40 PM

## 2023-01-12 ENCOUNTER — Inpatient Hospital Stay: Payer: Medicare Other

## 2023-01-12 ENCOUNTER — Ambulatory Visit
Admission: RE | Admit: 2023-01-12 | Discharge: 2023-01-12 | Disposition: A | Discharge status: Home / Self Care | Payer: Medicare Other | Source: Ambulatory Visit | Attending: Radiation Oncology | Admitting: Radiation Oncology

## 2023-01-12 ENCOUNTER — Other Ambulatory Visit: Payer: Self-pay

## 2023-01-12 ENCOUNTER — Telehealth: Payer: Self-pay

## 2023-01-12 ENCOUNTER — Telehealth: Payer: Self-pay | Admitting: Internal Medicine

## 2023-01-12 ENCOUNTER — Encounter: Payer: Self-pay | Admitting: Emergency Medicine

## 2023-01-12 ENCOUNTER — Emergency Department
Admission: EM | Admit: 2023-01-12 | Discharge: 2023-01-12 | Disposition: A | Discharge status: Home / Self Care | Payer: Medicare Other | Attending: Emergency Medicine | Admitting: Emergency Medicine

## 2023-01-12 ENCOUNTER — Telehealth: Payer: Self-pay | Admitting: *Deleted

## 2023-01-12 DIAGNOSIS — K9423 Gastrostomy malfunction: Secondary | ICD-10-CM | POA: Insufficient documentation

## 2023-01-12 DIAGNOSIS — Z85818 Personal history of malignant neoplasm of other sites of lip, oral cavity, and pharynx: Secondary | ICD-10-CM | POA: Insufficient documentation

## 2023-01-12 DIAGNOSIS — Z510 Encounter for antineoplastic radiation therapy: Secondary | ICD-10-CM | POA: Diagnosis not present

## 2023-01-12 DIAGNOSIS — R1310 Dysphagia, unspecified: Secondary | ICD-10-CM | POA: Diagnosis not present

## 2023-01-12 DIAGNOSIS — G893 Neoplasm related pain (acute) (chronic): Secondary | ICD-10-CM | POA: Diagnosis not present

## 2023-01-12 DIAGNOSIS — E119 Type 2 diabetes mellitus without complications: Secondary | ICD-10-CM | POA: Diagnosis not present

## 2023-01-12 DIAGNOSIS — I10 Essential (primary) hypertension: Secondary | ICD-10-CM | POA: Insufficient documentation

## 2023-01-12 DIAGNOSIS — E43 Unspecified severe protein-calorie malnutrition: Secondary | ICD-10-CM | POA: Diagnosis not present

## 2023-01-12 DIAGNOSIS — C770 Secondary and unspecified malignant neoplasm of lymph nodes of head, face and neck: Secondary | ICD-10-CM | POA: Diagnosis not present

## 2023-01-12 DIAGNOSIS — Z86718 Personal history of other venous thrombosis and embolism: Secondary | ICD-10-CM | POA: Diagnosis not present

## 2023-01-12 DIAGNOSIS — F17220 Nicotine dependence, chewing tobacco, uncomplicated: Secondary | ICD-10-CM | POA: Diagnosis not present

## 2023-01-12 DIAGNOSIS — Z79899 Other long term (current) drug therapy: Secondary | ICD-10-CM | POA: Diagnosis not present

## 2023-01-12 DIAGNOSIS — C109 Malignant neoplasm of oropharynx, unspecified: Secondary | ICD-10-CM | POA: Diagnosis not present

## 2023-01-12 DIAGNOSIS — Z5111 Encounter for antineoplastic chemotherapy: Secondary | ICD-10-CM | POA: Diagnosis not present

## 2023-01-12 DIAGNOSIS — Z7982 Long term (current) use of aspirin: Secondary | ICD-10-CM | POA: Diagnosis not present

## 2023-01-12 LAB — RAD ONC ARIA SESSION SUMMARY
Course Elapsed Days: 34
Plan Fractions Treated to Date: 19
Plan Prescribed Dose Per Fraction: 2 Gy
Plan Total Fractions Prescribed: 35
Plan Total Prescribed Dose: 70 Gy
Reference Point Dosage Given to Date: 38 Gy
Reference Point Session Dosage Given: 2 Gy
Session Number: 19

## 2023-01-12 NOTE — Telephone Encounter (Signed)
-----   Message from Yvonne Kendall, MD sent at 01/11/2023  1:48 PM EDT ----- Regarding: Hospital follow-up Good afternoon,  Mr. Bieker will be discharged from the hospital later today.  Can you arrange for him to f/u with one of the APP's in ~2 weeks?  Thanks.  Thayer Ohm

## 2023-01-12 NOTE — Telephone Encounter (Signed)
Call returned to [facility] and left message per Kathyrn Drown, NP OK to manage patient PEG

## 2023-01-12 NOTE — ED Provider Notes (Signed)
   [facility] Provider Note    Event Date/Time   First MD Initiated Contact with Patient 01/12/23 1443     (approximate)  History   Chief Complaint: Feeding Tube Problem  HPI  Kenneth Gilmore is a 72 y.o. male with a past medical history diabetes, hypertension, hyperlipidemia, oropharyngeal cancer who presents to the emergency department for malfunctioning G-tube.  According to the patient he just had a G-tube placed endoscopically 2 days ago.  He states he woke up this morning and found that the tip had broken off and the G-tube was leaking into his bed so the patient came to the emergency department for evaluation.  Denies any abdominal pain denies any other symptoms.  Physical Exam   Triage Vital Signs: ED Triage Vitals  Enc Vitals Group     BP 01/12/23 1312 107/60     Pulse Rate 01/12/23 1312 78     Resp 01/12/23 1312 18     Temp 01/12/23 1312 98.4 F (36.9 C)     Temp Source 01/12/23 1312 Oral     SpO2 01/12/23 1312 98 %     Weight --      Height --      Head Circumference --      Peak Flow --      Pain Score 01/12/23 1313 0     Pain Loc --      Pain Edu? --      Excl. in GC? --     Most recent vital signs: Vitals:   01/12/23 1312  BP: 107/60  Pulse: 78  Resp: 18  Temp: 98.4 F (36.9 C)  SpO2: 98%    General: Awake, no distress.  CV:  Good peripheral perfusion.  Resp:  Normal effort.   Abd:  No distention.  Soft, nontender.  G-tube present approximately 6 to 8 inches of the G-tube is present however the tip is missing and the G-tube is leaking gastric contents onto the bed.  ED Results / Procedures / Treatments   MEDICATIONS ORDERED IN ED: Medications - No data to display   IMPRESSION / MDM / ASSESSMENT AND PLAN / ED COURSE  I reviewed the triage vital signs and the nursing notes.  Patient's presentation is most consistent with acute, uncomplicated illness.  Patient presents to the emergency department for leaking G-tube  that was placed endoscopically 2 days ago.  Patient's tube appears to be functioning well in the correct location as gastric contents are coming out of the end of the tube.  The issue with the tube is that the tip has broken off where the Luer-Lok feeding tubing would typically connect.  I was able to remove the Luer-Lok tip from a 22 [place] G-tube and placed this into the end of the patient's current G-tube.  Appears to be functioning well appears to be a good fit for the tube and appears to be tightly secured.  Patient has no other complaints and states he is ready to go home.  Will discharge the patient.  I did ask that the patient follow-up with his GI doctor regarding the G-tube malfunction to see if any permanent solution can be found however I believe this will work well for the foreseeable future.  FINAL CLINICAL IMPRESSION(S) / ED DIAGNOSES   G-tube malfunction    Note:  This document was prepared using Dragon voice recognition software and may include unintentional dictation errors.   Minna Antis, MD 01/12/23 1504

## 2023-01-12 NOTE — Telephone Encounter (Signed)
Left message on voice MAIL

## 2023-01-12 NOTE — Telephone Encounter (Signed)
Call from [facility]) stating that patient had a PEG tube inserted and they have been referred for this. Patient has not seen his PCP in a long time so she is asking if Dr Cathie Hoops or Geanie Berlin, NP would sign orders for home health to manage his tube. Please advise

## 2023-01-12 NOTE — ED Triage Notes (Signed)
Patient to ED via POV for feeding tube problem. Patient states he woke up this AM with tube leaking. Appears tubing broke off and is needing to be replaced. Denies any pain.

## 2023-01-12 NOTE — ED Notes (Signed)
Pt in NAD; G-tube assessed by provider and tip replaced by other staff member. Visitor at bedside.

## 2023-01-12 NOTE — Telephone Encounter (Signed)
-----   Message from Christopher End, MD sent at 01/11/2023  1:48 PM EDT ----- Regarding: Hospital follow-up Good afternoon,  Mr. Leonardo will be discharged from the hospital later today.  Can you arrange for him to f/u with one of the APP's in ~2 weeks?  Thanks.  Chris  

## 2023-01-12 NOTE — Telephone Encounter (Signed)
Nutrition  Received phone call from patient this am.  Says that he is scared that the feeding tube is leaking out the end of it.  Denies leaking around his skin.  Reports fluid is dull clear looking.  "My T-shirt is wet and my bed linen is wet."  Says that tube does not have a clamp on tubing part or stopper on the end of the tube.  Otherwise feels ok.    RD offered for patient to come in this am to look at tube.  He will be coming at 11:30am for radiation.  Someone is bringing him to this appointment and he can't get here before then.  RD will see patient after radiation.    Asked patient to use piece of gauze with tape to cover the end of the tube to help catch the leaking fluid.  From what patient is describing it sounds like normal gastric juices flowing out of the end of the tube without a stopper.  Explained this to patient.  Verbalized understanding.   Hessie Varone B. Freida Busman, RD, LDN Registered Dietitian [phone]

## 2023-01-12 NOTE — Telephone Encounter (Signed)
Left voice mail to schedule appt

## 2023-01-12 NOTE — Discharge Instructions (Signed)
You have been seen in the emergency department for malfunctioning G-tube.  The tip has been replaced and should be functioning properly once again.  Return to the emergency department for any further leaking or any other symptom concerning to yourself.  Otherwise please call your GI doctor who placed the G-tube to have it evaluated.

## 2023-01-12 NOTE — Progress Notes (Signed)
Nutrition  Acute add on after radiation.   Patient seen in clinic and end feeding port of tube has been removed from feeding tube.  Patient has the end of tube in bag.  Tube is leaking gastric contents.  Reviewed feeding schedule with patient and sister once tube if replaced. Written instructions given  Spoke with Elouise Munroe, NP and recommended patient go to ER to have tube replaced. NP spoke with patient and sister.   RD will follow-up with patient on Friday, 5/24 as planned  Mirza Fessel B. Freida Busman, RD, LDN Registered Dietitian [phone]

## 2023-01-13 ENCOUNTER — Inpatient Hospital Stay (HOSPITAL_BASED_OUTPATIENT_CLINIC_OR_DEPARTMENT_OTHER): Payer: Medicare Other | Admitting: Oncology

## 2023-01-13 ENCOUNTER — Ambulatory Visit: Payer: Medicare Other | Admitting: Oncology

## 2023-01-13 ENCOUNTER — Other Ambulatory Visit: Payer: Medicare Other

## 2023-01-13 ENCOUNTER — Ambulatory Visit: Payer: Medicare Other

## 2023-01-13 ENCOUNTER — Encounter: Payer: Self-pay | Admitting: Oncology

## 2023-01-13 ENCOUNTER — Inpatient Hospital Stay: Payer: Medicare Other

## 2023-01-13 ENCOUNTER — Ambulatory Visit
Admission: RE | Admit: 2023-01-13 | Discharge: 2023-01-13 | Disposition: A | Discharge status: Home / Self Care | Payer: Medicare Other | Source: Ambulatory Visit | Attending: Radiation Oncology | Admitting: Radiation Oncology

## 2023-01-13 ENCOUNTER — Other Ambulatory Visit: Payer: Self-pay

## 2023-01-13 VITALS — BP 97/69 | HR 85 | Temp 95.6°F | Resp 18 | Wt 168.9 lb

## 2023-01-13 DIAGNOSIS — Z86718 Personal history of other venous thrombosis and embolism: Secondary | ICD-10-CM | POA: Diagnosis not present

## 2023-01-13 DIAGNOSIS — Z7982 Long term (current) use of aspirin: Secondary | ICD-10-CM | POA: Diagnosis not present

## 2023-01-13 DIAGNOSIS — B370 Candidal stomatitis: Secondary | ICD-10-CM

## 2023-01-13 DIAGNOSIS — G893 Neoplasm related pain (acute) (chronic): Secondary | ICD-10-CM | POA: Diagnosis not present

## 2023-01-13 DIAGNOSIS — C770 Secondary and unspecified malignant neoplasm of lymph nodes of head, face and neck: Secondary | ICD-10-CM | POA: Diagnosis not present

## 2023-01-13 DIAGNOSIS — I513 Intracardiac thrombosis, not elsewhere classified: Secondary | ICD-10-CM

## 2023-01-13 DIAGNOSIS — Z931 Gastrostomy status: Secondary | ICD-10-CM | POA: Insufficient documentation

## 2023-01-13 DIAGNOSIS — E43 Unspecified severe protein-calorie malnutrition: Secondary | ICD-10-CM

## 2023-01-13 DIAGNOSIS — C109 Malignant neoplasm of oropharynx, unspecified: Secondary | ICD-10-CM | POA: Diagnosis not present

## 2023-01-13 DIAGNOSIS — I2510 Atherosclerotic heart disease of native coronary artery without angina pectoris: Secondary | ICD-10-CM | POA: Diagnosis not present

## 2023-01-13 DIAGNOSIS — F17220 Nicotine dependence, chewing tobacco, uncomplicated: Secondary | ICD-10-CM | POA: Diagnosis not present

## 2023-01-13 DIAGNOSIS — Z510 Encounter for antineoplastic radiation therapy: Secondary | ICD-10-CM | POA: Diagnosis not present

## 2023-01-13 DIAGNOSIS — R252 Cramp and spasm: Secondary | ICD-10-CM

## 2023-01-13 DIAGNOSIS — Z79899 Other long term (current) drug therapy: Secondary | ICD-10-CM | POA: Diagnosis not present

## 2023-01-13 DIAGNOSIS — R1310 Dysphagia, unspecified: Secondary | ICD-10-CM | POA: Diagnosis not present

## 2023-01-13 DIAGNOSIS — Z5111 Encounter for antineoplastic chemotherapy: Secondary | ICD-10-CM | POA: Diagnosis not present

## 2023-01-13 LAB — CBC WITH DIFFERENTIAL (CANCER CENTER ONLY)
Abs Immature Granulocytes: 0.04 10*3/uL (ref 0.00–0.07)
Basophils Absolute: 0 10*3/uL (ref 0.0–0.1)
Basophils Relative: 1 %
Eosinophils Absolute: 0 10*3/uL (ref 0.0–0.5)
Eosinophils Relative: 1 %
HCT: 34.3 % — ABNORMAL LOW (ref 39.0–52.0)
Hemoglobin: 11.6 g/dL — ABNORMAL LOW (ref 13.0–17.0)
Immature Granulocytes: 2 %
Lymphocytes Relative: 23 %
Lymphs Abs: 0.6 10*3/uL — ABNORMAL LOW (ref 0.7–4.0)
MCH: 32.6 pg (ref 26.0–34.0)
MCHC: 33.8 g/dL (ref 30.0–36.0)
MCV: 96.3 fL (ref 80.0–100.0)
Monocytes Absolute: 0.4 10*3/uL (ref 0.1–1.0)
Monocytes Relative: 14 %
Neutro Abs: 1.6 10*3/uL — ABNORMAL LOW (ref 1.7–7.7)
Neutrophils Relative %: 59 %
Platelet Count: 259 10*3/uL (ref 150–400)
RBC: 3.56 MIL/uL — ABNORMAL LOW (ref 4.22–5.81)
RDW: 13.3 % (ref 11.5–15.5)
WBC Count: 2.7 10*3/uL — ABNORMAL LOW (ref 4.0–10.5)
nRBC: 0 % (ref 0.0–0.2)

## 2023-01-13 LAB — CMP (CANCER CENTER ONLY)
ALT: 19 U/L (ref 0–44)
AST: 30 U/L (ref 15–41)
Albumin: 3.4 g/dL — ABNORMAL LOW (ref 3.5–5.0)
Alkaline Phosphatase: 57 U/L (ref 38–126)
Anion gap: 20 — ABNORMAL HIGH (ref 5–15)
BUN: 26 mg/dL — ABNORMAL HIGH (ref 8–23)
CO2: 26 mmol/L (ref 22–32)
Calcium: 9 mg/dL (ref 8.9–10.3)
Chloride: 93 mmol/L — ABNORMAL LOW (ref 98–111)
Creatinine: 0.89 mg/dL (ref 0.61–1.24)
GFR, Estimated: >60 mL/min (ref >60)
Glucose, Bld: 138 mg/dL — ABNORMAL HIGH (ref 70–99)
Potassium: 4 mmol/L (ref 3.5–5.1)
Sodium: 139 mmol/L (ref 135–145)
Total Bilirubin: 1.2 mg/dL (ref 0.3–1.2)
Total Protein: 6.6 g/dL (ref 6.5–8.1)

## 2023-01-13 LAB — RAD ONC ARIA SESSION SUMMARY
Course Elapsed Days: 35
Plan Fractions Treated to Date: 20
Plan Prescribed Dose Per Fraction: 2 Gy
Plan Total Fractions Prescribed: 35
Plan Total Prescribed Dose: 70 Gy
Reference Point Dosage Given to Date: 40 Gy
Reference Point Session Dosage Given: 2 Gy
Session Number: 20

## 2023-01-13 LAB — MAGNESIUM: Magnesium: 2 mg/dL (ref 1.7–2.4)

## 2023-01-13 MED FILL — Fosaprepitant Dimeglumine For IV Infusion 150 MG (Base Eq): INTRAVENOUS | Qty: 5 | Status: AC

## 2023-01-13 MED FILL — Dexamethasone Sodium Phosphate Inj 100 MG/10ML: INTRAMUSCULAR | Qty: 1 | Status: AC

## 2023-01-13 NOTE — Progress Notes (Signed)
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  Labs are reviewed and discussed with patient. Proceed with cisplatin dose reduce to 30mg /m2 - performance status, recent cardiovascular comorbidity Refer to home health   History of DVT (deep vein thrombosis) Now on Eliquis and Plavix    Neoplasm related pain  oxycodone 5mg  Q6h PRN pain.    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   Encounter for antineoplastic chemotherapy Chemotherapy plan as listed above  Protein-calorie malnutrition, severe (HCC) On Tube Feeding. Follow up with nutritionist.   S/P percutaneous endoscopic gastrostomy (PEG) tube placement [facility]) Home health referral for PEG tube care.    Thrush Recommend nystatin mouth rinse swish and spit.   Trismus Barium swallow indicates aspiration.  recommend NPO, Tube feeding, medication via G tube.    Orders Placed This Encounter  Procedures   CMP (Cancer Center only)    Standing Status:   Future    Standing Expiration Date:   02/04/2024   CBC with Differential (Cancer Center Only)    Standing Status:   Future    Standing Expiration Date:   02/04/2024   Magnesium    Standing Status:   Future    Standing Expiration Date:   02/04/2024   Follow-up 1 week lab MD cisplatin.  All questions were answered. The patient knows to call the clinic with any problems, questions or concerns.  [name], MD, PhD [facility] Hematology Oncology 01/13/2023   HISTORY OF PRESENTING ILLNESS:  Patient presented outpatient ultrasound of his left lower extremity showing positive for DVT. Denies any recent,, surgery, travel or issue.  Denies any family history of DVT.  Reports that he developed left lower extremity swelling for a week, associated with moderate severity throbbing aching discomfort feels like pulling a muscle.  Denies any shortness of breath. Patient was started on Xarelto starting kit and advised to follow-up with heme-onc.  INTERVAL HISTORY Kenneth Gilmore is a 72 y.o. male presents to re-establish care for oropharyngeal squamous cell carcinoma  Oncology History  Oropharyngeal cancer (HCC)  09/28/2022 Imaging   CT soft tissue neck with contrast showed infiltrating mass in the right tongue with multiple ipsilateral nodal metastases. The primary mass measures at least 4 cm and infiltrates parapharyngeal fat, visualization of tumor extent is limited by artifact from dental amalgam.   10/28/2022 Initial Diagnosis   Squamous cell carcinoma of tongue (HCC) Patient has throat pain and was evaluated by ENT Dr.Bennett.  Physical examination showed cervical lymphadenopathy.  10/15/2022 ultrasound-guided biopsy of right cervical lymph node showed fibrous tissue, detach minute salivary gland tissue.  Granular debris's and a few dysplastic squamous epithelial cells, nondiagnostic.  10/22/2022 repeat ultrasound-guided biopsy of right cervical lymph node was positive for malignancy, metastatic squamous cell carcinoma, keratinizing, in a background of dense fibrosis and tumor necrosis.   10/28/2022 Cancer Staging   Staging form: Oral Cavity, AJCC 8th Edition - Clinical: Stage IVA (cT3, cN2a, cM0) - Signed by [name], MD on  11/02/2022   11/16/2022 Imaging   PET scan showed 1. Large infiltrating right oropharyngeal mass is markedly hypermetabolic and consistent with known squamous cell  carcinoma. 2. Bilateral hypermetabolic metastatic cervical lymphadenopathy. 3. No evidence of metastatic disease involving the chest, abdomen/pelvis or bony structures.     12/09/2022 -  Chemotherapy   Patient is on Treatment Plan : HEAD/NECK Cisplatin (40) q7d     01/03/2023 - 01/11/2023 Hospital Admission   Kenneth Gilmore is a 72 year old male with non-insulin-dependent diabetes mellitus, hyperlipidemia, history of DVT on Xarelto, oropharyngeal squamous cell carcinoma stage IVa, who presents emergency department from oncology clinic for chief concerns of syncope event at home while he was in the shower. High sensitive troponin is 2925.  Telemetry showed atrial flutter, he was initially given diltiazem gtt., and quickly converted to sinus. Patient also had a significant dysphagia from oropharyngeal cancer, G tube is placed on 5/17, TF started.  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  2 drug-eluting stents were placed.  Patient is placed on Plavix.     01/07/2023 Procedure   S/p gastrostomy tube.  placement.  5/22 G tube tip was broken and Luer-Lok tip was replaced in ER    INTERVAL HISTORY Kenneth Gilmore is a 72 y.o. male who has above history reviewed by me today presents for follow up visit for P16 + oropharyngeal squamous cell carcinoma Currently on concurrent chemotherapy with radiation.  Treatment was held last week due to weakness.  Hospitalization last week. See oncology history. He accidentally cut the tip of G tube yesterday and had tip replaced in ER.  He did not do much of tube feeding yesterday, still takes PO Denies fever, cough, chills, abdominal pain.    Review of Systems  Constitutional:  Negative for chills, fever, malaise/fatigue and weight loss.  HENT:  Negative for sore throat.         Sore throat, right ear pain  Eyes:  Negative for redness.  Respiratory:  Negative for cough, shortness of breath and wheezing.   Cardiovascular:  Negative for chest pain and palpitations.  Gastrointestinal:  Negative for abdominal pain, blood in stool, nausea and vomiting.  Genitourinary:  Negative for dysuria.  Musculoskeletal:  Negative for myalgias.  Skin:  Negative for rash.  Neurological:  Negative for dizziness, tingling and tremors.  Endo/Heme/Allergies:  Does not bruise/bleed easily.  Psychiatric/Behavioral:  Negative for hallucinations.     MEDICAL HISTORY:  Past Medical History:  Diagnosis Date   Diabetes mellitus without complication (HCC)    Hyperlipidemia    Hypertension    Oropharyngeal cancer (HCC)     SURGICAL HISTORY: Past Surgical History:  Procedure Laterality Date   arm surgery Left    CORONARY ANGIOPLASTY WITH STENT PLACEMENT     CORONARY STENT INTERVENTION N/A 01/10/2023   Procedure: CORONARY STENT INTERVENTION;  Surgeon: Iran Ouch, MD;  Location: [facility] INVASIVE CV LAB;  Service: Cardiovascular;  Laterality: N/A;   IR GASTROSTOMY TUBE MOD SED  01/07/2023   left side rotator cuff surgery     RIGHT/LEFT HEART CATH AND CORONARY ANGIOGRAPHY N/A 01/10/2023   Procedure: RIGHT/LEFT HEART CATH AND CORONARY ANGIOGRAPHY;  Surgeon: Iran Ouch, MD;  Location: [facility] INVASIVE CV LAB;  Service: Cardiovascular;  Laterality: N/A;   SHOULDER SURGERY      SOCIAL HISTORY: Social History  Socioeconomic History   Marital status: Single    Spouse name: Not on file   Number of children: Not on file   Years of education: Not on file   Highest education level: Not on file  Occupational History   Occupation: retired  Tobacco Use   Smoking status: Never   Smokeless tobacco: Current    Types: Chew  Substance and Sexual Activity   Alcohol use: No   Drug use: Never   Sexual activity: Not Currently  Other Topics Concern   Not on file  Social History  Narrative   Not on file   Social Determinants of Health   Financial Resource Strain: Low Risk  (12/27/2022)   Overall Financial Resource Strain (CARDIA)    Difficulty of Paying Living Expenses: Not hard at all  Food Insecurity: No Food Insecurity (01/06/2023)   Hunger Vital Sign    Worried About Running Out of Food in the Last Year: Never true    Ran Out of Food in the Last Year: Never true  Transportation Needs: No Transportation Needs (01/06/2023)   PRAPARE - Administrator, Civil Service (Medical): No    Lack of Transportation (Non-Medical): No  Physical Activity: Not on file  Stress: Stress Concern Present (12/27/2022)   [employer] of Occupational Health - Occupational Stress Questionnaire    Feeling of Stress : To some extent  Social Connections: Moderately Integrated (12/27/2022)   Social Connection and Isolation Panel [NHANES]    Frequency of Communication with Friends and Family: More than three times a week    Frequency of Social Gatherings with Friends and Family: More than three times a week    Attends Religious Services: More than 4 times per year    Active Member of [employer] or Organizations: Yes    Attends Banker Meetings: More than 4 times per year    Marital Status: Never married  Intimate Partner Violence: Not At Risk (01/06/2023)   Humiliation, Afraid, Rape, and Kick questionnaire    Fear of Current or Ex-Partner: No    Emotionally Abused: No    Physically Abused: No    Sexually Abused: No    FAMILY HISTORY: Family History  Problem Relation Age of Onset   Cancer Father        lung    ALLERGIES:  has No Known Allergies.  MEDICATIONS:  Current Outpatient Medications  Medication Sig Dispense Refill   apixaban (ELIQUIS) 5 MG TABS tablet Place 1 tablet (5 mg total) into feeding tube 2 (two) times daily. 60 tablet 0   atorvastatin (LIPITOR) 80 MG tablet Place 1 tablet (80 mg total) into feeding tube daily. 30 tablet 0   clopidogrel  (PLAVIX) 75 MG tablet Place 1 tablet (75 mg total) into feeding tube daily with breakfast. 30 tablet 0   empagliflozin (JARDIANCE) 10 MG TABS tablet Take 1 tablet (10 mg total) by mouth daily. 30 tablet 0   metoprolol tartrate (LOPRESSOR) 25 MG tablet Place 1 tablet (25 mg total) into feeding tube 2 (two) times daily. 60 tablet 0   Nutritional Supplements (FEEDING SUPPLEMENT, OSMOLITE 1.5 CAL,) LIQD Place 415 mLs into feeding tube 4 (four) times daily - after meals and at bedtime.  0   oxyCODONE (OXY IR/ROXICODONE) 5 MG immediate release tablet Place 1 tablet (5 mg total) into feeding tube every 6 (six) hours as needed for severe pain. 30 tablet 0   Protein (FEEDING SUPPLEMENT, PROSOURCE TF20,) liquid Place 60 mLs into feeding  tube 2 (two) times daily.     Water For Irrigation, Sterile (FREE WATER) SOLN Place 200 mLs into feeding tube 4 (four) times daily - after meals and at bedtime.     No current facility-administered medications for this visit.     PHYSICAL EXAMINATION: ECOG PERFORMANCE STATUS: 1 - Symptomatic but completely ambulatory Vitals:   01/13/23 0857  BP: 97/69  Pulse: 85  Resp: 18  Temp: (!) 95.6 F (35.3 C)  SpO2: 95%   Filed Weights   01/13/23 0857  Weight: 168 lb 14.4 oz (76.6 kg)    Physical Exam Constitutional:      General: He is not in acute distress. HENT:     Head: Normocephalic and atraumatic.     Mouth/Throat:     Comments: Restricted mouth opening/trismus Thrush Eyes:     General: No scleral icterus. Cardiovascular:     Rate and Rhythm: Normal rate.  Pulmonary:     Effort: Pulmonary effort is normal. No respiratory distress.     Breath sounds: Normal breath sounds. No wheezing.  Abdominal:     General: Bowel sounds are normal. There is no distension.     Palpations: Abdomen is soft.  Musculoskeletal:        General: No swelling. Normal range of motion.     Cervical back: Normal range of motion and neck supple.  Lymphadenopathy:     Cervical:  Cervical adenopathy present.  Skin:    General: Skin is warm and dry.     Findings: No erythema or rash.  Neurological:     Mental Status: He is alert and oriented to person, place, and time. Mental status is at baseline.     Cranial Nerves: No cranial nerve deficit.     Coordination: Coordination normal.  Psychiatric:        Mood and Affect: Mood normal.     LABORATORY DATA:  I have reviewed the data as listed     Latest Ref Rng & Units 01/13/2023    8:52 AM 01/11/2023    5:50 AM 01/10/2023    5:52 PM  CBC  WBC 4.0 - 10.5 K/uL 2.7  2.7    Hemoglobin 13.0 - 17.0 g/dL 16.1  09.6  9.9   Hematocrit 39.0 - 52.0 % 34.3  28.6  29.0   Platelets 150 - 400 K/uL 259  137        Latest Ref Rng & Units 01/13/2023    8:52 AM 01/11/2023    5:50 AM 01/10/2023    5:52 PM  CMP  Glucose 70 - 99 mg/dL 045  87    BUN 8 - 23 mg/dL 26  11    Creatinine 4.09 - 1.24 mg/dL 8.11  9.14    Sodium 782 - 145 mmol/L 139  137  118   Potassium 3.5 - 5.1 mmol/L 4.0  3.8  3.1   Chloride 98 - 111 mmol/L 93  99    CO2 22 - 32 mmol/L 26  27    Calcium 8.9 - 10.3 mg/dL 9.0  8.2    Total Protein 6.5 - 8.1 g/dL 6.6     Total Bilirubin 0.3 - 1.2 mg/dL 1.2     Alkaline Phos 38 - 126 U/L 57     AST 15 - 41 U/L 30     ALT 0 - 44 U/L 19

## 2023-01-13 NOTE — Assessment & Plan Note (Signed)
Barium swallow indicates aspiration.  recommend NPO, Tube feeding, medication via G tube.

## 2023-01-13 NOTE — Assessment & Plan Note (Signed)
Chemotherapy plan as listed above 

## 2023-01-13 NOTE — Assessment & Plan Note (Signed)
Now on Eliquis and Plavix

## 2023-01-13 NOTE — Assessment & Plan Note (Signed)
oxycodone 5mg Q6h PRN pain.   

## 2023-01-13 NOTE — Assessment & Plan Note (Addendum)
LV thrombus and Atrial Flutter,ejection fraction 35 to 40%,  Continue Eliquis 5mg  BID

## 2023-01-13 NOTE — Assessment & Plan Note (Signed)
Recommend nystatin mouth rinse swish and spit.  

## 2023-01-13 NOTE — Assessment & Plan Note (Signed)
S/p angioplasty and stents Continue Plavix.

## 2023-01-13 NOTE — Assessment & Plan Note (Addendum)
stage IVA oropharyngeal squamous cell carcinoma, Patient is on concurrent chemotherapy with Radiation.  Labs are reviewed and discussed with patient. Proceed with cisplatin dose reduce to 30mg /m2 - performance status, recent cardiovascular comorbidity Refer to home health

## 2023-01-13 NOTE — Assessment & Plan Note (Signed)
On Tube Feeding. Follow up with nutritionist.

## 2023-01-13 NOTE — Assessment & Plan Note (Addendum)
Home health referral for PEG tube care.

## 2023-01-14 ENCOUNTER — Inpatient Hospital Stay: Payer: Medicare Other

## 2023-01-14 ENCOUNTER — Other Ambulatory Visit: Payer: Self-pay

## 2023-01-14 ENCOUNTER — Ambulatory Visit: Payer: Medicare Other

## 2023-01-14 ENCOUNTER — Ambulatory Visit
Admission: RE | Admit: 2023-01-14 | Discharge: 2023-01-14 | Disposition: A | Discharge status: Home / Self Care | Payer: Medicare Other | Source: Ambulatory Visit | Attending: Radiation Oncology | Admitting: Radiation Oncology

## 2023-01-14 VITALS — BP 99/63 | HR 77 | Temp 98.0°F | Resp 16

## 2023-01-14 DIAGNOSIS — C109 Malignant neoplasm of oropharynx, unspecified: Secondary | ICD-10-CM | POA: Diagnosis not present

## 2023-01-14 DIAGNOSIS — F17220 Nicotine dependence, chewing tobacco, uncomplicated: Secondary | ICD-10-CM | POA: Diagnosis not present

## 2023-01-14 DIAGNOSIS — G893 Neoplasm related pain (acute) (chronic): Secondary | ICD-10-CM | POA: Diagnosis not present

## 2023-01-14 DIAGNOSIS — C770 Secondary and unspecified malignant neoplasm of lymph nodes of head, face and neck: Secondary | ICD-10-CM | POA: Diagnosis not present

## 2023-01-14 DIAGNOSIS — R1310 Dysphagia, unspecified: Secondary | ICD-10-CM | POA: Diagnosis not present

## 2023-01-14 DIAGNOSIS — E43 Unspecified severe protein-calorie malnutrition: Secondary | ICD-10-CM | POA: Diagnosis not present

## 2023-01-14 DIAGNOSIS — Z86718 Personal history of other venous thrombosis and embolism: Secondary | ICD-10-CM | POA: Diagnosis not present

## 2023-01-14 DIAGNOSIS — Z79899 Other long term (current) drug therapy: Secondary | ICD-10-CM | POA: Diagnosis not present

## 2023-01-14 DIAGNOSIS — Z510 Encounter for antineoplastic radiation therapy: Secondary | ICD-10-CM | POA: Diagnosis not present

## 2023-01-14 DIAGNOSIS — Z5111 Encounter for antineoplastic chemotherapy: Secondary | ICD-10-CM | POA: Diagnosis not present

## 2023-01-14 DIAGNOSIS — Z7982 Long term (current) use of aspirin: Secondary | ICD-10-CM | POA: Diagnosis not present

## 2023-01-14 LAB — RAD ONC ARIA SESSION SUMMARY
Course Elapsed Days: 36
Plan Fractions Treated to Date: 21
Plan Prescribed Dose Per Fraction: 2 Gy
Plan Total Fractions Prescribed: 35
Plan Total Prescribed Dose: 70 Gy
Reference Point Dosage Given to Date: 42 Gy
Reference Point Session Dosage Given: 2 Gy
Session Number: 21

## 2023-01-14 MED ORDER — MAGNESIUM SULFATE 2 GM/50ML IV SOLN
2.0000 g | Freq: Once | INTRAVENOUS | Status: AC
  Administered 2023-01-14: 2 g via INTRAVENOUS
  Filled 2023-01-14: qty 50

## 2023-01-14 MED ORDER — POTASSIUM CHLORIDE IN NACL 20-0.9 MEQ/L-% IV SOLN
Freq: Once | INTRAVENOUS | Status: AC
  Filled 2023-01-14: qty 1000

## 2023-01-14 MED ORDER — SODIUM CHLORIDE 0.9 % IV SOLN
30.0000 mg/m2 | Freq: Once | INTRAVENOUS | Status: AC
  Administered 2023-01-14: 58 mg via INTRAVENOUS
  Filled 2023-01-14: qty 58

## 2023-01-14 MED ORDER — OSMOLITE 1.5 CAL PO LIQD: ORAL | 0 refills | Status: AC

## 2023-01-14 MED ORDER — SODIUM CHLORIDE 0.9 % IV SOLN
10.0000 mg | Freq: Once | INTRAVENOUS | Status: AC
  Administered 2023-01-14: 10 mg via INTRAVENOUS
  Filled 2023-01-14: qty 10

## 2023-01-14 MED ORDER — SODIUM CHLORIDE 0.9 % IV SOLN
Freq: Once | INTRAVENOUS | Status: AC
  Filled 2023-01-14: qty 250

## 2023-01-14 MED ORDER — PALONOSETRON HCL INJECTION 0.25 MG/5ML
0.2500 mg | Freq: Once | INTRAVENOUS | Status: AC
  Administered 2023-01-14: 0.25 mg via INTRAVENOUS
  Filled 2023-01-14: qty 5

## 2023-01-14 MED ORDER — SODIUM CHLORIDE 0.9 % IV SOLN
150.0000 mg | Freq: Once | INTRAVENOUS | Status: AC
  Administered 2023-01-14: 150 mg via INTRAVENOUS
  Filled 2023-01-14: qty 150

## 2023-01-14 NOTE — Progress Notes (Signed)
1020: Osmolite 1.5 8oz feeding given to patient via peg tube. (Assisted)  1152: Okay to Run post fluids with treatment, per MD.  1400: 2nd Feeding assisted with same amount.

## 2023-01-14 NOTE — Progress Notes (Addendum)
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  Patient with trismus unable to open mouth wide enough to eat.  Also with silent aspiration per SLP.  Recent hospital admission with PEG tube placement on 5/20.   Patient in ED on 5/22 with PEG tube malfunction (feeding port end cut off of feeding tube). Anticipate long term need for enteral nutrition due to oropharyngeal cancer, treatment side effects and severe trismus.   Met with patient during infusion.  Reports that he was able to give 2 feedings of osmolite 1.5 yesterday.  Gave 1 feeding before coming to clinic this am.  Reports that tube is working well.  Has a hard time screwing the enfit top back on the tube due to shaky hands.  Says that he is overwhelmed and trying to do the best that he can.  Thinks he is giving about 1/2 cup water to start with and then about 1 cup water after.  Patient only able to take few sips of water by mouth, no other nutrition orally.    RD observed PEG tube and site.     Medications: reviewed  Labs: reviewed  Anthropometrics:   Weight 168 lb 14.4 oz today  177 lb on 5/9 190 lb 4.8 oz on 4/25 UBW of 205-210 lb 3-4 months ago   Estimated Energy Needs  Kcals: 2150-2580 Protein: 108-129 g Fluid: 2150-2580 ml  NUTRITION DIAGNOSIS: Severe malnutrition continues   MALNUTRITION DIAGNOSIS: Severe malnutrition continues   INTERVENTION:  Patient will need total of 7 cartons of osmolite 1.5, daily via feeding tube (2 cartons at 8am, noon and 4pm and 1 carton at 8pm).  Flush with 60cc water before and after each feeding. Additional water TID between feedings Provides: 2485 calories, 104 g protein and 2467 ml free water (with water flush and free water from formula). Meets 100% of calorie needs.  Reviewed tube feeding regimen again with patient and wrote it down.  Flush with 1/4 cup (60 cc water) before and after each feeding QID.  Give 1  carton of osmolite 1.4 at 8am, noon, 4pm and 8pm.  Flush with additional 1 cup water between feedings TID.  Will titrate patient up after tolerating this regimen.   Reviewed PEG care and cleaning around site.  Sent message to [facility] as patient has not received formula or bolus supplies.  Message received back that patient is not on service with [facility]. Spoke with representative with [facility], Pam and referral for tube feeding sent to them.   Patient has donated cases of formula and syringes to use until able to get a delivery of formula    MONITORING, EVALUATION, GOAL: weight trends, tube feeding   NEXT VISIT: Friday, May 31 during infusion  Jayshawn Colston B. Freida Busman, RD, LDN Registered Dietitian [phone]

## 2023-01-14 NOTE — Addendum Note (Signed)
Addended by: Alphonse Guild B on: 01/14/2023 02:56 PM   Modules accepted: Orders

## 2023-01-15 DIAGNOSIS — I2510 Atherosclerotic heart disease of native coronary artery without angina pectoris: Secondary | ICD-10-CM | POA: Diagnosis not present

## 2023-01-15 DIAGNOSIS — Z48812 Encounter for surgical aftercare following surgery on the circulatory system: Secondary | ICD-10-CM | POA: Diagnosis not present

## 2023-01-15 DIAGNOSIS — I4892 Unspecified atrial flutter: Secondary | ICD-10-CM | POA: Diagnosis not present

## 2023-01-15 DIAGNOSIS — I4891 Unspecified atrial fibrillation: Secondary | ICD-10-CM | POA: Diagnosis not present

## 2023-01-15 DIAGNOSIS — I214 Non-ST elevation (NSTEMI) myocardial infarction: Secondary | ICD-10-CM | POA: Diagnosis not present

## 2023-01-18 ENCOUNTER — Telehealth: Payer: Self-pay

## 2023-01-18 ENCOUNTER — Ambulatory Visit
Admission: RE | Admit: 2023-01-18 | Discharge: 2023-01-18 | Disposition: A | Discharge status: Home / Self Care | Payer: Medicare Other | Source: Ambulatory Visit | Attending: Radiation Oncology | Admitting: Radiation Oncology

## 2023-01-18 ENCOUNTER — Other Ambulatory Visit: Payer: Self-pay

## 2023-01-18 DIAGNOSIS — Z510 Encounter for antineoplastic radiation therapy: Secondary | ICD-10-CM | POA: Diagnosis not present

## 2023-01-18 DIAGNOSIS — E43 Unspecified severe protein-calorie malnutrition: Secondary | ICD-10-CM | POA: Diagnosis not present

## 2023-01-18 DIAGNOSIS — C109 Malignant neoplasm of oropharynx, unspecified: Secondary | ICD-10-CM | POA: Diagnosis not present

## 2023-01-18 DIAGNOSIS — F17220 Nicotine dependence, chewing tobacco, uncomplicated: Secondary | ICD-10-CM | POA: Diagnosis not present

## 2023-01-18 DIAGNOSIS — Z7982 Long term (current) use of aspirin: Secondary | ICD-10-CM | POA: Diagnosis not present

## 2023-01-18 DIAGNOSIS — Z5111 Encounter for antineoplastic chemotherapy: Secondary | ICD-10-CM | POA: Diagnosis not present

## 2023-01-18 DIAGNOSIS — C770 Secondary and unspecified malignant neoplasm of lymph nodes of head, face and neck: Secondary | ICD-10-CM | POA: Diagnosis not present

## 2023-01-18 DIAGNOSIS — Z86718 Personal history of other venous thrombosis and embolism: Secondary | ICD-10-CM | POA: Diagnosis not present

## 2023-01-18 DIAGNOSIS — G893 Neoplasm related pain (acute) (chronic): Secondary | ICD-10-CM | POA: Diagnosis not present

## 2023-01-18 DIAGNOSIS — R1310 Dysphagia, unspecified: Secondary | ICD-10-CM | POA: Diagnosis not present

## 2023-01-18 DIAGNOSIS — Z79899 Other long term (current) drug therapy: Secondary | ICD-10-CM | POA: Diagnosis not present

## 2023-01-18 LAB — RAD ONC ARIA SESSION SUMMARY
Course Elapsed Days: 40
Plan Fractions Treated to Date: 22
Plan Prescribed Dose Per Fraction: 2 Gy
Plan Total Fractions Prescribed: 35
Plan Total Prescribed Dose: 70 Gy
Reference Point Dosage Given to Date: 44 Gy
Reference Point Session Dosage Given: 2 Gy
Session Number: 22

## 2023-01-18 NOTE — Telephone Encounter (Signed)
Nutrition  Received call from patient wanting to know how to titrate feeding.  Reports that he has been tolerating 4 cartons of osmolite 1.5 over the weekend.  Has a few cartons of osmolite 1.5 left and about 2 cases of isosource 1.5 (RD provided prior to admission).    Instructed patient to try 1 1/2 cartons of osmolite 1.5/isosource 1.5 QID.  Patient to place remaining 1/2 carton of formula in refrigerator and pull out prior to next feeding to warm to room temperature.  Patient verbalized understanding.  Informed patient that [facility] was sent referral for enteral supplies.  Patient knows to contact RD with questions about feeding or if needs more formula or supplies prior to delivery.  Kenneth Gilmore, RD, LDN Registered Dietitian [phone]

## 2023-01-19 ENCOUNTER — Telehealth: Payer: Self-pay | Admitting: *Deleted

## 2023-01-19 ENCOUNTER — Other Ambulatory Visit: Payer: Self-pay | Admitting: Hospice and Palliative Medicine

## 2023-01-19 ENCOUNTER — Ambulatory Visit
Admission: RE | Admit: 2023-01-19 | Discharge: 2023-01-19 | Disposition: A | Discharge status: Home / Self Care | Payer: Medicare Other | Source: Ambulatory Visit | Attending: Radiation Oncology | Admitting: Radiation Oncology

## 2023-01-19 ENCOUNTER — Telehealth: Payer: Self-pay | Admitting: Hospice and Palliative Medicine

## 2023-01-19 ENCOUNTER — Other Ambulatory Visit: Payer: Self-pay

## 2023-01-19 DIAGNOSIS — R1310 Dysphagia, unspecified: Secondary | ICD-10-CM | POA: Diagnosis not present

## 2023-01-19 DIAGNOSIS — Z5111 Encounter for antineoplastic chemotherapy: Secondary | ICD-10-CM | POA: Diagnosis not present

## 2023-01-19 DIAGNOSIS — C109 Malignant neoplasm of oropharynx, unspecified: Secondary | ICD-10-CM | POA: Diagnosis not present

## 2023-01-19 DIAGNOSIS — Z7982 Long term (current) use of aspirin: Secondary | ICD-10-CM | POA: Diagnosis not present

## 2023-01-19 DIAGNOSIS — Z86718 Personal history of other venous thrombosis and embolism: Secondary | ICD-10-CM | POA: Diagnosis not present

## 2023-01-19 DIAGNOSIS — C770 Secondary and unspecified malignant neoplasm of lymph nodes of head, face and neck: Secondary | ICD-10-CM | POA: Diagnosis not present

## 2023-01-19 DIAGNOSIS — Z79899 Other long term (current) drug therapy: Secondary | ICD-10-CM | POA: Diagnosis not present

## 2023-01-19 DIAGNOSIS — G893 Neoplasm related pain (acute) (chronic): Secondary | ICD-10-CM | POA: Diagnosis not present

## 2023-01-19 DIAGNOSIS — F17220 Nicotine dependence, chewing tobacco, uncomplicated: Secondary | ICD-10-CM | POA: Diagnosis not present

## 2023-01-19 DIAGNOSIS — E43 Unspecified severe protein-calorie malnutrition: Secondary | ICD-10-CM | POA: Diagnosis not present

## 2023-01-19 DIAGNOSIS — Z510 Encounter for antineoplastic radiation therapy: Secondary | ICD-10-CM | POA: Diagnosis not present

## 2023-01-19 LAB — RAD ONC ARIA SESSION SUMMARY
Course Elapsed Days: 41
Plan Fractions Treated to Date: 23
Plan Prescribed Dose Per Fraction: 2 Gy
Plan Total Fractions Prescribed: 35
Plan Total Prescribed Dose: 70 Gy
Reference Point Dosage Given to Date: 46 Gy
Reference Point Session Dosage Given: 2 Gy
Session Number: 23

## 2023-01-19 NOTE — Telephone Encounter (Signed)
Home health nurse called and stated pt was having reaction to medication and was told to speak to Laurette Schimke.,  Nurse name Joannie [place] @ [phone]

## 2023-01-19 NOTE — Telephone Encounter (Signed)
Eliquis and plavix managed bycardiology. They should reach out to his cardiology office to check on this.

## 2023-01-19 NOTE — Telephone Encounter (Signed)
[name] with [name] called reporting that there is a drug interaction between patient Kenneth Gilmore and Plavix. Please advise.if he is to continue both or not and call her back

## 2023-01-20 ENCOUNTER — Other Ambulatory Visit: Payer: Self-pay

## 2023-01-20 ENCOUNTER — Ambulatory Visit: Payer: Medicare Other | Admitting: Oncology

## 2023-01-20 ENCOUNTER — Other Ambulatory Visit: Payer: Medicare Other

## 2023-01-20 ENCOUNTER — Ambulatory Visit
Admission: RE | Admit: 2023-01-20 | Discharge: 2023-01-20 | Disposition: A | Discharge status: Home / Self Care | Payer: Medicare Other | Source: Ambulatory Visit | Attending: Radiation Oncology | Admitting: Radiation Oncology

## 2023-01-20 ENCOUNTER — Ambulatory Visit: Payer: Medicare Other

## 2023-01-20 ENCOUNTER — Encounter: Payer: Self-pay | Admitting: Oncology

## 2023-01-20 DIAGNOSIS — R1310 Dysphagia, unspecified: Secondary | ICD-10-CM | POA: Diagnosis not present

## 2023-01-20 DIAGNOSIS — Z7982 Long term (current) use of aspirin: Secondary | ICD-10-CM | POA: Diagnosis not present

## 2023-01-20 DIAGNOSIS — C109 Malignant neoplasm of oropharynx, unspecified: Secondary | ICD-10-CM | POA: Diagnosis not present

## 2023-01-20 DIAGNOSIS — C770 Secondary and unspecified malignant neoplasm of lymph nodes of head, face and neck: Secondary | ICD-10-CM | POA: Diagnosis not present

## 2023-01-20 DIAGNOSIS — Z5189 Encounter for other specified aftercare: Secondary | ICD-10-CM | POA: Diagnosis not present

## 2023-01-20 DIAGNOSIS — F17220 Nicotine dependence, chewing tobacco, uncomplicated: Secondary | ICD-10-CM | POA: Diagnosis not present

## 2023-01-20 DIAGNOSIS — Z5111 Encounter for antineoplastic chemotherapy: Secondary | ICD-10-CM | POA: Diagnosis not present

## 2023-01-20 DIAGNOSIS — E43 Unspecified severe protein-calorie malnutrition: Secondary | ICD-10-CM | POA: Diagnosis not present

## 2023-01-20 DIAGNOSIS — G893 Neoplasm related pain (acute) (chronic): Secondary | ICD-10-CM | POA: Diagnosis not present

## 2023-01-20 DIAGNOSIS — Z510 Encounter for antineoplastic radiation therapy: Secondary | ICD-10-CM | POA: Diagnosis not present

## 2023-01-20 DIAGNOSIS — Z79899 Other long term (current) drug therapy: Secondary | ICD-10-CM | POA: Diagnosis not present

## 2023-01-20 DIAGNOSIS — Z86718 Personal history of other venous thrombosis and embolism: Secondary | ICD-10-CM | POA: Diagnosis not present

## 2023-01-20 LAB — RAD ONC ARIA SESSION SUMMARY
Course Elapsed Days: 42
Plan Fractions Treated to Date: 24
Plan Prescribed Dose Per Fraction: 2 Gy
Plan Total Fractions Prescribed: 35
Plan Total Prescribed Dose: 70 Gy
Reference Point Dosage Given to Date: 48 Gy
Reference Point Session Dosage Given: 2 Gy
Session Number: 24

## 2023-01-20 MED FILL — Dexamethasone Sodium Phosphate Inj 100 MG/10ML: INTRAMUSCULAR | Qty: 1 | Status: AC

## 2023-01-20 MED FILL — Fosaprepitant Dimeglumine For IV Infusion 150 MG (Base Eq): INTRAVENOUS | Qty: 5 | Status: AC

## 2023-01-20 NOTE — Telephone Encounter (Signed)
CAll returned to [facility] and I got voice mail and left message to contact patient cardiologists office re medicine interactions

## 2023-01-21 ENCOUNTER — Ambulatory Visit
Admission: RE | Admit: 2023-01-21 | Discharge: 2023-01-21 | Disposition: A | Discharge status: Home / Self Care | Payer: Medicare Other | Source: Ambulatory Visit | Attending: Radiation Oncology | Admitting: Radiation Oncology

## 2023-01-21 ENCOUNTER — Inpatient Hospital Stay: Payer: Medicare Other

## 2023-01-21 ENCOUNTER — Encounter: Payer: Self-pay | Admitting: Oncology

## 2023-01-21 ENCOUNTER — Inpatient Hospital Stay (HOSPITAL_BASED_OUTPATIENT_CLINIC_OR_DEPARTMENT_OTHER): Payer: Medicare Other | Admitting: Oncology

## 2023-01-21 ENCOUNTER — Ambulatory Visit: Payer: Medicare Other

## 2023-01-21 ENCOUNTER — Other Ambulatory Visit: Payer: Self-pay

## 2023-01-21 VITALS — BP 94/69 | HR 75 | Temp 98.3°F | Resp 18 | Wt 168.8 lb

## 2023-01-21 DIAGNOSIS — Z5111 Encounter for antineoplastic chemotherapy: Secondary | ICD-10-CM | POA: Diagnosis not present

## 2023-01-21 DIAGNOSIS — Z510 Encounter for antineoplastic radiation therapy: Secondary | ICD-10-CM | POA: Diagnosis not present

## 2023-01-21 DIAGNOSIS — E43 Unspecified severe protein-calorie malnutrition: Secondary | ICD-10-CM

## 2023-01-21 DIAGNOSIS — C109 Malignant neoplasm of oropharynx, unspecified: Secondary | ICD-10-CM

## 2023-01-21 DIAGNOSIS — Z7982 Long term (current) use of aspirin: Secondary | ICD-10-CM | POA: Diagnosis not present

## 2023-01-21 DIAGNOSIS — C770 Secondary and unspecified malignant neoplasm of lymph nodes of head, face and neck: Secondary | ICD-10-CM | POA: Diagnosis not present

## 2023-01-21 DIAGNOSIS — Z86718 Personal history of other venous thrombosis and embolism: Secondary | ICD-10-CM | POA: Diagnosis not present

## 2023-01-21 DIAGNOSIS — R1310 Dysphagia, unspecified: Secondary | ICD-10-CM | POA: Diagnosis not present

## 2023-01-21 DIAGNOSIS — G893 Neoplasm related pain (acute) (chronic): Secondary | ICD-10-CM

## 2023-01-21 DIAGNOSIS — Z931 Gastrostomy status: Secondary | ICD-10-CM | POA: Diagnosis not present

## 2023-01-21 DIAGNOSIS — Z79899 Other long term (current) drug therapy: Secondary | ICD-10-CM | POA: Diagnosis not present

## 2023-01-21 DIAGNOSIS — T8131XA Disruption of external operation (surgical) wound, not elsewhere classified, initial encounter: Secondary | ICD-10-CM | POA: Diagnosis not present

## 2023-01-21 DIAGNOSIS — S31109A Unspecified open wound of abdominal wall, unspecified quadrant without penetration into peritoneal cavity, initial encounter: Secondary | ICD-10-CM | POA: Diagnosis not present

## 2023-01-21 DIAGNOSIS — F17220 Nicotine dependence, chewing tobacco, uncomplicated: Secondary | ICD-10-CM | POA: Diagnosis not present

## 2023-01-21 LAB — RAD ONC ARIA SESSION SUMMARY
Course Elapsed Days: 43
Plan Fractions Treated to Date: 25
Plan Prescribed Dose Per Fraction: 2 Gy
Plan Total Fractions Prescribed: 35
Plan Total Prescribed Dose: 70 Gy
Reference Point Dosage Given to Date: 50 Gy
Reference Point Session Dosage Given: 2 Gy
Session Number: 25

## 2023-01-21 LAB — CMP (CANCER CENTER ONLY)
ALT: 30 U/L (ref 0–44)
AST: 31 U/L (ref 15–41)
Albumin: 3.7 g/dL (ref 3.5–5.0)
Alkaline Phosphatase: 75 U/L (ref 38–126)
Anion gap: 12 (ref 5–15)
BUN: 26 mg/dL — ABNORMAL HIGH (ref 8–23)
CO2: 28 mmol/L (ref 22–32)
Calcium: 9.1 mg/dL (ref 8.9–10.3)
Chloride: 95 mmol/L — ABNORMAL LOW (ref 98–111)
Creatinine: 0.75 mg/dL (ref 0.61–1.24)
GFR, Estimated: >60 mL/min (ref >60)
Glucose, Bld: 273 mg/dL — ABNORMAL HIGH (ref 70–99)
Potassium: 4.4 mmol/L (ref 3.5–5.1)
Sodium: 135 mmol/L (ref 135–145)
Total Bilirubin: 0.5 mg/dL (ref 0.3–1.2)
Total Protein: 7 g/dL (ref 6.5–8.1)

## 2023-01-21 LAB — CBC WITH DIFFERENTIAL (CANCER CENTER ONLY)
Abs Immature Granulocytes: 0.13 10*3/uL — ABNORMAL HIGH (ref 0.00–0.07)
Basophils Absolute: 0 10*3/uL (ref 0.0–0.1)
Basophils Relative: 1 %
Eosinophils Absolute: 0 10*3/uL (ref 0.0–0.5)
Eosinophils Relative: 0 %
HCT: 34.2 % — ABNORMAL LOW (ref 39.0–52.0)
Hemoglobin: 11.7 g/dL — ABNORMAL LOW (ref 13.0–17.0)
Immature Granulocytes: 3 %
Lymphocytes Relative: 14 %
Lymphs Abs: 0.7 10*3/uL (ref 0.7–4.0)
MCH: 32.9 pg (ref 26.0–34.0)
MCHC: 34.2 g/dL (ref 30.0–36.0)
MCV: 96.1 fL (ref 80.0–100.0)
Monocytes Absolute: 0.8 10*3/uL (ref 0.1–1.0)
Monocytes Relative: 16 %
Neutro Abs: 3.5 10*3/uL (ref 1.7–7.7)
Neutrophils Relative %: 66 %
Platelet Count: 499 10*3/uL — ABNORMAL HIGH (ref 150–400)
RBC: 3.56 MIL/uL — ABNORMAL LOW (ref 4.22–5.81)
RDW: 14.8 % (ref 11.5–15.5)
WBC Count: 5.3 10*3/uL (ref 4.0–10.5)
nRBC: 0.8 % — ABNORMAL HIGH (ref 0.0–0.2)

## 2023-01-21 LAB — MAGNESIUM: Magnesium: 2.2 mg/dL (ref 1.7–2.4)

## 2023-01-21 MED ORDER — POTASSIUM CHLORIDE IN NACL 20-0.9 MEQ/L-% IV SOLN
Freq: Once | INTRAVENOUS | Status: AC
  Filled 2023-01-21: qty 1000

## 2023-01-21 MED ORDER — SODIUM CHLORIDE 0.9 % IV SOLN
10.0000 mg | Freq: Once | INTRAVENOUS | Status: AC
  Administered 2023-01-21: 10 mg via INTRAVENOUS
  Filled 2023-01-21: qty 10

## 2023-01-21 MED ORDER — PALONOSETRON HCL INJECTION 0.25 MG/5ML
0.2500 mg | Freq: Once | INTRAVENOUS | Status: AC
  Administered 2023-01-21: 0.25 mg via INTRAVENOUS
  Filled 2023-01-21: qty 5

## 2023-01-21 MED ORDER — SODIUM CHLORIDE 0.9 % IV SOLN
30.0000 mg/m2 | Freq: Once | INTRAVENOUS | Status: AC
  Administered 2023-01-21: 58 mg via INTRAVENOUS
  Filled 2023-01-21: qty 58

## 2023-01-21 MED ORDER — MAGNESIUM SULFATE 2 GM/50ML IV SOLN
2.0000 g | Freq: Once | INTRAVENOUS | Status: AC
  Administered 2023-01-21: 2 g via INTRAVENOUS
  Filled 2023-01-21: qty 50

## 2023-01-21 MED ORDER — SODIUM CHLORIDE 0.9 % IV SOLN
150.0000 mg | Freq: Once | INTRAVENOUS | Status: AC
  Administered 2023-01-21: 150 mg via INTRAVENOUS
  Filled 2023-01-21: qty 150

## 2023-01-21 MED ORDER — SODIUM CHLORIDE 0.9 % IV SOLN
Freq: Once | INTRAVENOUS | Status: AC
  Filled 2023-01-21: qty 250

## 2023-01-21 NOTE — Assessment & Plan Note (Signed)
Chemotherapy plan as listed above 

## 2023-01-21 NOTE — Progress Notes (Signed)
Per Dr. Orlie Dakin, OK to run post IV fluids+potassium with Cisplatin. Patient tolerated well. Stable at discharge.

## 2023-01-21 NOTE — Patient Instructions (Signed)
[facility]  Discharge Instructions: Thank you for choosing [facility] to provide your oncology and hematology care.  If you have a lab appointment with the Cancer Center, please go directly to the Cancer Center and check in at the registration area.  Wear comfortable clothing and clothing appropriate for easy access to any Portacath or PICC line.   We strive to give you quality time with your provider. You may need to reschedule your appointment if you arrive late (15 or more minutes).  Arriving late affects you and other patients whose appointments are after yours.  Also, if you miss three or more appointments without notifying the office, you may be dismissed from the clinic at the provider's discretion.      For prescription refill requests, have your pharmacy contact our office and allow 72 hours for refills to be completed.     To help prevent nausea and vomiting after your treatment, we encourage you to take your nausea medication as directed.  BELOW ARE SYMPTOMS THAT SHOULD BE REPORTED IMMEDIATELY: *FEVER GREATER THAN 100.4 F (38 C) OR HIGHER *CHILLS OR SWEATING *NAUSEA AND VOMITING THAT IS NOT CONTROLLED WITH YOUR NAUSEA MEDICATION *UNUSUAL SHORTNESS OF BREATH *UNUSUAL BRUISING OR BLEEDING *URINARY PROBLEMS (pain or burning when urinating, or frequent urination) *BOWEL PROBLEMS (unusual diarrhea, constipation, pain near the anus) TENDERNESS IN MOUTH AND THROAT WITH OR WITHOUT PRESENCE OF ULCERS (sore throat, sores in mouth, or a toothache) UNUSUAL RASH, SWELLING OR PAIN  UNUSUAL VAGINAL DISCHARGE OR ITCHING   Items with * indicate a potential emergency and should be followed up as soon as possible or go to the Emergency Department if any problems should occur.  Please show the CHEMOTHERAPY ALERT CARD or IMMUNOTHERAPY ALERT CARD at check-in to the Emergency Department and triage nurse.  Should you have questions after your visit  or need to cancel or reschedule your appointment, please contact [facility]  [phone] and follow the prompts.  Office hours are 8:00 a.m. to 4:30 p.m. Monday - Friday. Please note that voicemails left after 4:00 p.m. may not be returned until the following business day.  We are closed weekends and major holidays. You have access to a nurse at all times for urgent questions. Please call the main number to the clinic [phone] and follow the prompts.  For any non-urgent questions, you may also contact your provider using MyChart. We now offer e-Visits for anyone 18 and older to request care online for non-urgent symptoms. For details visit [email or web address].   Also download the MyChart app! Go to the app store, search "MyChart", open the app, select [place], and log in with your MyChart username and password.    

## 2023-01-21 NOTE — Assessment & Plan Note (Signed)
Home health referral for PEG tube care.   

## 2023-01-21 NOTE — Progress Notes (Signed)
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    Met with patient during infusion.  Reports that he is giving 12 oz of formula 4 times a day (osmolite/isosource).  Flushing with 1 cup of water at each feeding (some before and some after).  Reports taking dulcolax times 2 yesterday then had watery bowel movement.  Still has not received shipment from [facility]   Medications: reviewed  Labs: reviewed  Anthropometrics:   Weight 168 lb 12.8 oz  today  168 lb 14.4 oz on 5/24 177 lb on 5/9 190 lb 4.8 oz on 4/25  UBW of 205-210 lb 3-4 months ago   Estimated Energy Needs  Kcals: 2150-2580 Protein: 108-129 g Fluid: 2150-2580 ml  NUTRITION DIAGNOSIS: Severe malnutrition continues   MALNUTRITION DIAGNOSIS: Severe malnutrition   INTERVENTION:  Patient does not think he can increase feeding to 7 cartons per day as he thinks he will throw up (2 cartons at a feeding).  Currently tolerating 6 cartons (12 oz 4 times a day).  Flush 1 cup QID.   Tube feeding provides 2130 calories, 89 g protein and free water Called [facility] representative for update on enteral supplies 2 cases of osmolite 1.5 given to patient today as running low on formula    MONITORING, EVALUATION, GOAL: weight trends, tube feeding   NEXT VISIT: phone call Thursday, June 6  Anberlin Diez B. Freida Busman, RD, LDN Registered Dietitian [phone]

## 2023-01-21 NOTE — Progress Notes (Signed)
Hematology/Oncology Progress note [phone] [phone]      REASON FOR VISIT Follow up for oropharyngeal squamous cell carcinoma  ASSESSMENT & PLAN:   Cancer Staging  Oropharyngeal cancer (HCC) Staging form: Oral Cavity, AJCC 8th Edition - Clinical: Stage IVA (cT3, cN2a, cM0) - Signed by Kenneth Patience, MD on 11/02/2022   Oropharyngeal cancer (HCC) stage IVA oropharyngeal squamous cell carcinoma, Patient is on concurrent chemotherapy with Radiation.  Labs are reviewed and discussed with patient. Proceed with cisplatin dose reduce to 30mg /m2 - dose reduce due to performance status, recent cardiovascular comorbidity Refer to home health   Neoplasm related pain  oxycodone 5mg  Q6h PRN pain.    Encounter for antineoplastic chemotherapy Chemotherapy plan as listed above  Protein-calorie malnutrition, severe (HCC) On Tube Feeding. Follow up with nutritionist. Weight is stable.   S/P percutaneous endoscopic gastrostomy (PEG) tube placement [facility]) Home health referral for PEG tube care.     No orders of the defined types were placed in this encounter.  Follow-up 1 week lab MD cisplatin.  All questions were answered. The patient knows to call the clinic with any problems, questions or concerns.  Kenneth Patience, MD, PhD [facility] Hematology Oncology 01/21/2023   HISTORY OF PRESENTING ILLNESS:  Patient presented outpatient ultrasound of his left lower extremity showing positive for DVT. Denies any recent,, surgery, travel or issue.  Denies any family history of DVT.  Reports that he developed left lower extremity swelling for a week, associated with moderate severity throbbing aching discomfort feels like pulling a muscle.  Denies any shortness of breath. Patient was started on Xarelto starting kit and advised to follow-up with heme-onc.  INTERVAL HISTORY Kenneth Gilmore is a 72 y.o. male presents to re-establish care for oropharyngeal squamous cell  carcinoma  Oncology History  Oropharyngeal cancer (HCC)  09/28/2022 Imaging   CT soft tissue neck with contrast showed infiltrating mass in the right tongue with multiple ipsilateral nodal metastases. The primary mass measures at least 4 cm and infiltrates parapharyngeal fat, visualization of tumor extent is limited by artifact from dental amalgam.   10/28/2022 Initial Diagnosis   Squamous cell carcinoma of tongue (HCC) Patient has throat pain and was evaluated by ENT Dr.Bennett.  Physical examination showed cervical lymphadenopathy.  10/15/2022 ultrasound-guided biopsy of right cervical lymph node showed fibrous tissue, detach minute salivary gland tissue.  Granular debris's and a few dysplastic squamous epithelial cells, nondiagnostic.  10/22/2022 repeat ultrasound-guided biopsy of right cervical lymph node was positive for malignancy, metastatic squamous cell carcinoma, keratinizing, in a background of dense fibrosis and tumor necrosis.   10/28/2022 Cancer Staging   Staging form: Oral Cavity, AJCC 8th Edition - Clinical: Stage IVA (cT3, cN2a, cM0) - Signed by Kenneth Patience, MD on 11/02/2022   11/16/2022 Imaging   PET scan showed 1. Large infiltrating right oropharyngeal mass is markedly hypermetabolic and consistent with known squamous cell carcinoma. 2. Bilateral hypermetabolic metastatic cervical lymphadenopathy. 3. No evidence of metastatic disease involving the chest, abdomen/pelvis or bony structures.     12/09/2022 -  Chemotherapy   Patient is on Treatment Plan : HEAD/NECK Cisplatin (40) q7d     01/03/2023 - 01/11/2023 Hospital Admission   Mr. Kenneth Gilmore is a 72 year old male with non-insulin-dependent diabetes mellitus, hyperlipidemia, history of DVT on Xarelto, oropharyngeal squamous cell carcinoma stage IVa, who presents emergency department from oncology clinic for chief concerns of syncope event at home while he was in the shower. High sensitive troponin is 2925.  Telemetry showed  atrial flutter, he was initially given diltiazem gtt., and quickly converted to sinus. Patient also had a significant dysphagia from oropharyngeal cancer, G tube is placed on 5/17, TF started.  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  2 drug-eluting stents were placed.  Patient is placed on Plavix.     01/07/2023 Procedure   S/p gastrostomy tube.  placement.  5/22 G tube tip was broken and Luer-Lok tip was replaced in ER    INTERVAL HISTORY Kenneth Gilmore is a 72 y.o. male who has above history reviewed by me today presents for follow up visit for P16 + oropharyngeal squamous cell carcinoma Currently on concurrent chemotherapy with radiation.  Today he reports feeling better. Weight is stable.  Denies fever, cough, chills, abdominal pain.   He does tube feeding 12oz 4 time per day, he tolerates well.  Denies nausea vomiting, diarrhea, abdominal pain.   Review of Systems  Constitutional:  Negative for chills, fever, malaise/fatigue and weight loss.  HENT:  Negative for sore throat.        Right ear pain  Eyes:  Negative for redness.  Respiratory:  Negative for cough, shortness of breath and wheezing.   Cardiovascular:  Negative for chest pain and palpitations.  Gastrointestinal:  Negative for abdominal pain, blood in stool, nausea and vomiting.  Genitourinary:  Negative for dysuria.  Musculoskeletal:  Negative for myalgias.  Skin:  Negative for rash.  Neurological:  Negative for dizziness, tingling and tremors.  Endo/Heme/Allergies:  Does not bruise/bleed easily.  Psychiatric/Behavioral:  Negative for hallucinations.     MEDICAL HISTORY:  Past Medical History:  Diagnosis Date   Diabetes mellitus without complication (HCC)    Hyperlipidemia    Hypertension    Oropharyngeal cancer (HCC)      SURGICAL HISTORY: Past Surgical History:  Procedure Laterality Date   arm surgery Left    CORONARY ANGIOPLASTY WITH STENT PLACEMENT     CORONARY STENT INTERVENTION N/A 01/10/2023   Procedure: CORONARY STENT INTERVENTION;  Surgeon: Iran Ouch, MD;  Location: [facility] INVASIVE CV LAB;  Service: Cardiovascular;  Laterality: N/A;   IR GASTROSTOMY TUBE MOD SED  01/07/2023   left side rotator cuff surgery     RIGHT/LEFT HEART CATH AND CORONARY ANGIOGRAPHY N/A 01/10/2023   Procedure: RIGHT/LEFT HEART CATH AND CORONARY ANGIOGRAPHY;  Surgeon: Iran Ouch, MD;  Location: [facility] INVASIVE CV LAB;  Service: Cardiovascular;  Laterality: N/A;   SHOULDER SURGERY      SOCIAL HISTORY: Social History   Socioeconomic History   Marital status: Single    Spouse name: Not on file   Number of children: Not on file   Years of education: Not on file   Highest education level: Not on file  Occupational History   Occupation: retired  Tobacco Use   Smoking status: Never   Smokeless tobacco: Current    Types: Chew  Substance and Sexual Activity   Alcohol use: No   Drug use: Never   Sexual activity: Not Currently  Other Topics Concern   Not on file  Social History Narrative   Not on file   Social Determinants of Health   Financial Resource Strain: Low Risk  (12/27/2022)   Overall Financial Resource Strain (CARDIA)    Difficulty of Paying Living Expenses: Not hard at all  Food Insecurity: No Food Insecurity (  01/06/2023)   Hunger Vital Sign    Worried About Running Out of Food in the Last Year: Never true    Ran Out of Food in the Last Year: Never true  Transportation Needs: No Transportation Needs (01/06/2023)   PRAPARE - Administrator, Civil Service (Medical): No    Lack of Transportation (Non-Medical): No  Physical Activity: Not on file  Stress: Stress Concern Present (12/27/2022)   [employer] of Occupational Health - Occupational Stress Questionnaire    Feeling of  Stress : To some extent  Social Connections: Moderately Integrated (12/27/2022)   Social Connection and Isolation Panel [NHANES]    Frequency of Communication with Friends and Family: More than three times a week    Frequency of Social Gatherings with Friends and Family: More than three times a week    Attends Religious Services: More than 4 times per year    Active Member of [employer] or Organizations: Yes    Attends Banker Meetings: More than 4 times per year    Marital Status: Never married  Intimate Partner Violence: Not At Risk (01/06/2023)   Humiliation, Afraid, Rape, and Kick questionnaire    Fear of Current or Ex-Partner: No    Emotionally Abused: No    Physically Abused: No    Sexually Abused: No    FAMILY HISTORY: Family History  Problem Relation Age of Onset   Cancer Father        lung    ALLERGIES:  has No Known Allergies.  MEDICATIONS:  Current Outpatient Medications  Medication Sig Dispense Refill   apixaban (ELIQUIS) 5 MG TABS tablet Place 1 tablet (5 mg total) into feeding tube 2 (two) times daily. 60 tablet 0   atorvastatin (LIPITOR) 80 MG tablet Place 1 tablet (80 mg total) into feeding tube daily. 30 tablet 0   clopidogrel (PLAVIX) 75 MG tablet Place 1 tablet (75 mg total) into feeding tube daily with breakfast. 30 tablet 0   empagliflozin (JARDIANCE) 10 MG TABS tablet Take 1 tablet (10 mg total) by mouth daily. 30 tablet 0   metoprolol tartrate (LOPRESSOR) 25 MG tablet Place 1 tablet (25 mg total) into feeding tube 2 (two) times daily. 60 tablet 0   Nutritional Supplements (FEEDING SUPPLEMENT, OSMOLITE 1.5 CAL,) LIQD Give 2 cartons at 8am, noon, 4pm feeding via feeding tube.  Give 1 carton at 8pm via feeding tube.  Flush with 60ml water before and after each feeding.  Flush with additional TID between feedings to better meet hydration needs. Send bolus supplies. 1659 mL 0   oxyCODONE (OXY IR/ROXICODONE) 5 MG immediate release tablet Place 1 tablet  (5 mg total) into feeding tube every 6 (six) hours as needed for severe pain. 30 tablet 0   Protein (FEEDING SUPPLEMENT, PROSOURCE TF20,) liquid Place 60 mLs into feeding tube 2 (two) times daily.     Water For Irrigation, Sterile (FREE WATER) SOLN Place 200 mLs into feeding tube 4 (four) times daily - after meals and at bedtime.     No current facility-administered medications for this visit.     PHYSICAL EXAMINATION: ECOG PERFORMANCE STATUS: 1 - Symptomatic but completely ambulatory Vitals:   01/21/23 0852  BP: 94/69  Pulse: 75  Resp: 18  Temp: 98.3 F (36.8 C)   Filed Weights   01/21/23 0852  Weight: 168 lb 12.8 oz (76.6 kg)    Physical Exam Constitutional:      General: He is not in acute distress.  HENT:     Head: Normocephalic and atraumatic.     Mouth/Throat:     Comments: Restricted mouth opening/trismus Thrush Eyes:     General: No scleral icterus. Cardiovascular:     Rate and Rhythm: Normal rate.  Pulmonary:     Effort: Pulmonary effort is normal. No respiratory distress.     Breath sounds: Normal breath sounds. No wheezing.  Abdominal:     General: Bowel sounds are normal. There is no distension.     Palpations: Abdomen is soft.  Musculoskeletal:        General: No swelling. Normal range of motion.     Cervical back: Normal range of motion and neck supple.  Lymphadenopathy:     Cervical: Cervical adenopathy present.  Skin:    General: Skin is warm and dry.     Findings: No erythema or rash.  Neurological:     Mental Status: He is alert and oriented to person, place, and time. Mental status is at baseline.     Cranial Nerves: No cranial nerve deficit.     Coordination: Coordination normal.  Psychiatric:        Mood and Affect: Mood normal.      LABORATORY DATA:  I have reviewed the data as listed     Latest Ref Rng & Units 01/21/2023    8:23 AM 01/13/2023    8:52 AM 01/11/2023    5:50 AM  CBC  WBC 4.0 - 10.5 K/uL 5.3  2.7  2.7   Hemoglobin  13.0 - 17.0 g/dL 96.0  45.4  09.8   Hematocrit 39.0 - 52.0 % 34.2  34.3  28.6   Platelets 150 - 400 K/uL 499  259  137       Latest Ref Rng & Units 01/21/2023    8:23 AM 01/13/2023    8:52 AM 01/11/2023    5:50 AM  CMP  Glucose 70 - 99 mg/dL 119  147  87   BUN 8 - 23 mg/dL 26  26  11    Creatinine 0.61 - 1.24 mg/dL 8.29  5.62  1.30   Sodium 135 - 145 mmol/L 135  139  137   Potassium 3.5 - 5.1 mmol/L 4.4  4.0  3.8   Chloride 98 - 111 mmol/L 95  93  99   CO2 22 - 32 mmol/L 28  26  27    Calcium 8.9 - 10.3 mg/dL 9.1  9.0  8.2   Total Protein 6.5 - 8.1 g/dL 7.0  6.6    Total Bilirubin 0.3 - 1.2 mg/dL 0.5  1.2    Alkaline Phos 38 - 126 U/L 75  57    AST 15 - 41 U/L 31  30    ALT 0 - 44 U/L 30  19

## 2023-01-21 NOTE — Assessment & Plan Note (Signed)
oxycodone 5mg Q6h PRN pain.   

## 2023-01-21 NOTE — Assessment & Plan Note (Signed)
stage IVA oropharyngeal squamous cell carcinoma, Patient is on concurrent chemotherapy with Radiation.  Labs are reviewed and discussed with patient. Proceed with cisplatin dose reduce to 30mg /m2 - dose reduce due to performance status, recent cardiovascular comorbidity Refer to home health

## 2023-01-21 NOTE — Assessment & Plan Note (Signed)
On Tube Feeding. Follow up with nutritionist. Weight is stable.

## 2023-01-22 DIAGNOSIS — I4891 Unspecified atrial fibrillation: Secondary | ICD-10-CM | POA: Diagnosis not present

## 2023-01-22 DIAGNOSIS — I4892 Unspecified atrial flutter: Secondary | ICD-10-CM | POA: Diagnosis not present

## 2023-01-22 DIAGNOSIS — Z48812 Encounter for surgical aftercare following surgery on the circulatory system: Secondary | ICD-10-CM | POA: Diagnosis not present

## 2023-01-22 DIAGNOSIS — I2510 Atherosclerotic heart disease of native coronary artery without angina pectoris: Secondary | ICD-10-CM | POA: Diagnosis not present

## 2023-01-22 DIAGNOSIS — I214 Non-ST elevation (NSTEMI) myocardial infarction: Secondary | ICD-10-CM | POA: Diagnosis not present

## 2023-01-23 ENCOUNTER — Ambulatory Visit: Payer: Medicare Other

## 2023-01-24 ENCOUNTER — Ambulatory Visit
Admission: RE | Admit: 2023-01-24 | Discharge: 2023-01-24 | Disposition: A | Discharge status: Home / Self Care | Payer: Medicare Other | Source: Ambulatory Visit | Attending: Radiation Oncology | Admitting: Radiation Oncology

## 2023-01-24 ENCOUNTER — Other Ambulatory Visit: Payer: Self-pay

## 2023-01-24 DIAGNOSIS — C029 Malignant neoplasm of tongue, unspecified: Secondary | ICD-10-CM | POA: Insufficient documentation

## 2023-01-24 DIAGNOSIS — R1310 Dysphagia, unspecified: Secondary | ICD-10-CM | POA: Insufficient documentation

## 2023-01-24 DIAGNOSIS — R252 Cramp and spasm: Secondary | ICD-10-CM | POA: Diagnosis not present

## 2023-01-24 DIAGNOSIS — F17220 Nicotine dependence, chewing tobacco, uncomplicated: Secondary | ICD-10-CM | POA: Diagnosis not present

## 2023-01-24 DIAGNOSIS — C770 Secondary and unspecified malignant neoplasm of lymph nodes of head, face and neck: Secondary | ICD-10-CM | POA: Diagnosis not present

## 2023-01-24 DIAGNOSIS — Z510 Encounter for antineoplastic radiation therapy: Secondary | ICD-10-CM | POA: Insufficient documentation

## 2023-01-24 DIAGNOSIS — Z5111 Encounter for antineoplastic chemotherapy: Secondary | ICD-10-CM | POA: Diagnosis not present

## 2023-01-24 DIAGNOSIS — Z7982 Long term (current) use of aspirin: Secondary | ICD-10-CM | POA: Diagnosis not present

## 2023-01-24 DIAGNOSIS — G893 Neoplasm related pain (acute) (chronic): Secondary | ICD-10-CM | POA: Diagnosis not present

## 2023-01-24 DIAGNOSIS — Z86718 Personal history of other venous thrombosis and embolism: Secondary | ICD-10-CM | POA: Insufficient documentation

## 2023-01-24 DIAGNOSIS — Z931 Gastrostomy status: Secondary | ICD-10-CM | POA: Diagnosis not present

## 2023-01-24 DIAGNOSIS — C109 Malignant neoplasm of oropharynx, unspecified: Secondary | ICD-10-CM | POA: Diagnosis not present

## 2023-01-24 DIAGNOSIS — Z79899 Other long term (current) drug therapy: Secondary | ICD-10-CM | POA: Insufficient documentation

## 2023-01-24 LAB — RAD ONC ARIA SESSION SUMMARY
Course Elapsed Days: 46
Plan Fractions Treated to Date: 26
Plan Prescribed Dose Per Fraction: 2 Gy
Plan Total Fractions Prescribed: 35
Plan Total Prescribed Dose: 70 Gy
Reference Point Dosage Given to Date: 52 Gy
Reference Point Session Dosage Given: 2 Gy
Session Number: 26

## 2023-01-25 ENCOUNTER — Other Ambulatory Visit: Payer: Self-pay

## 2023-01-25 ENCOUNTER — Ambulatory Visit
Admission: RE | Admit: 2023-01-25 | Discharge: 2023-01-25 | Disposition: A | Discharge status: Home / Self Care | Payer: Medicare Other | Source: Ambulatory Visit | Attending: Radiation Oncology | Admitting: Radiation Oncology

## 2023-01-25 ENCOUNTER — Other Ambulatory Visit: Payer: Self-pay | Admitting: *Deleted

## 2023-01-25 ENCOUNTER — Inpatient Hospital Stay: Payer: Medicare Other

## 2023-01-25 ENCOUNTER — Encounter: Payer: Self-pay | Admitting: Nurse Practitioner

## 2023-01-25 ENCOUNTER — Inpatient Hospital Stay (HOSPITAL_BASED_OUTPATIENT_CLINIC_OR_DEPARTMENT_OTHER): Payer: Medicare Other | Admitting: Nurse Practitioner

## 2023-01-25 VITALS — Temp 97.1°F

## 2023-01-25 VITALS — BP 98/60 | HR 74

## 2023-01-25 DIAGNOSIS — I10 Essential (primary) hypertension: Secondary | ICD-10-CM | POA: Insufficient documentation

## 2023-01-25 DIAGNOSIS — E7800 Pure hypercholesterolemia, unspecified: Secondary | ICD-10-CM | POA: Insufficient documentation

## 2023-01-25 DIAGNOSIS — C109 Malignant neoplasm of oropharynx, unspecified: Secondary | ICD-10-CM

## 2023-01-25 DIAGNOSIS — E43 Unspecified severe protein-calorie malnutrition: Secondary | ICD-10-CM | POA: Insufficient documentation

## 2023-01-25 DIAGNOSIS — G250 Essential tremor: Secondary | ICD-10-CM | POA: Insufficient documentation

## 2023-01-25 DIAGNOSIS — F17220 Nicotine dependence, chewing tobacco, uncomplicated: Secondary | ICD-10-CM | POA: Diagnosis not present

## 2023-01-25 DIAGNOSIS — I4892 Unspecified atrial flutter: Secondary | ICD-10-CM | POA: Insufficient documentation

## 2023-01-25 DIAGNOSIS — Z5111 Encounter for antineoplastic chemotherapy: Secondary | ICD-10-CM | POA: Insufficient documentation

## 2023-01-25 DIAGNOSIS — Z510 Encounter for antineoplastic radiation therapy: Secondary | ICD-10-CM | POA: Diagnosis not present

## 2023-01-25 DIAGNOSIS — Z7729 Contact with and (suspected ) exposure to other hazardous substances: Secondary | ICD-10-CM | POA: Insufficient documentation

## 2023-01-25 DIAGNOSIS — Z5189 Encounter for other specified aftercare: Secondary | ICD-10-CM

## 2023-01-25 DIAGNOSIS — I959 Hypotension, unspecified: Secondary | ICD-10-CM

## 2023-01-25 DIAGNOSIS — Z7982 Long term (current) use of aspirin: Secondary | ICD-10-CM | POA: Diagnosis not present

## 2023-01-25 DIAGNOSIS — G893 Neoplasm related pain (acute) (chronic): Secondary | ICD-10-CM | POA: Diagnosis not present

## 2023-01-25 DIAGNOSIS — E860 Dehydration: Secondary | ICD-10-CM

## 2023-01-25 DIAGNOSIS — I82419 Acute embolism and thrombosis of unspecified femoral vein: Secondary | ICD-10-CM | POA: Insufficient documentation

## 2023-01-25 DIAGNOSIS — E119 Type 2 diabetes mellitus without complications: Secondary | ICD-10-CM | POA: Insufficient documentation

## 2023-01-25 DIAGNOSIS — R1310 Dysphagia, unspecified: Secondary | ICD-10-CM | POA: Diagnosis not present

## 2023-01-25 DIAGNOSIS — I951 Orthostatic hypotension: Secondary | ICD-10-CM

## 2023-01-25 DIAGNOSIS — Z7901 Long term (current) use of anticoagulants: Secondary | ICD-10-CM | POA: Insufficient documentation

## 2023-01-25 DIAGNOSIS — C029 Malignant neoplasm of tongue, unspecified: Secondary | ICD-10-CM | POA: Diagnosis not present

## 2023-01-25 DIAGNOSIS — Z79899 Other long term (current) drug therapy: Secondary | ICD-10-CM | POA: Insufficient documentation

## 2023-01-25 DIAGNOSIS — Z86718 Personal history of other venous thrombosis and embolism: Secondary | ICD-10-CM | POA: Diagnosis not present

## 2023-01-25 DIAGNOSIS — I2510 Atherosclerotic heart disease of native coronary artery without angina pectoris: Secondary | ICD-10-CM | POA: Insufficient documentation

## 2023-01-25 DIAGNOSIS — I259 Chronic ischemic heart disease, unspecified: Secondary | ICD-10-CM | POA: Insufficient documentation

## 2023-01-25 DIAGNOSIS — C770 Secondary and unspecified malignant neoplasm of lymph nodes of head, face and neck: Secondary | ICD-10-CM | POA: Diagnosis not present

## 2023-01-25 LAB — MAGNESIUM: Magnesium: 2 mg/dL (ref 1.7–2.4)

## 2023-01-25 LAB — CMP (CANCER CENTER ONLY)
ALT: 34 U/L (ref 0–44)
AST: 27 U/L (ref 15–41)
Albumin: 3.6 g/dL (ref 3.5–5.0)
Alkaline Phosphatase: 67 U/L (ref 38–126)
Anion gap: 8 (ref 5–15)
BUN: 30 mg/dL — ABNORMAL HIGH (ref 8–23)
CO2: 30 mmol/L (ref 22–32)
Calcium: 8.9 mg/dL (ref 8.9–10.3)
Chloride: 99 mmol/L (ref 98–111)
Creatinine: 0.78 mg/dL (ref 0.61–1.24)
GFR, Estimated: >60 mL/min (ref >60)
Glucose, Bld: 241 mg/dL — ABNORMAL HIGH (ref 70–99)
Potassium: 5.1 mmol/L (ref 3.5–5.1)
Sodium: 137 mmol/L (ref 135–145)
Total Bilirubin: 0.5 mg/dL (ref 0.3–1.2)
Total Protein: 6.4 g/dL — ABNORMAL LOW (ref 6.5–8.1)

## 2023-01-25 LAB — RAD ONC ARIA SESSION SUMMARY
Course Elapsed Days: 47
Plan Fractions Treated to Date: 27
Plan Prescribed Dose Per Fraction: 2 Gy
Plan Total Fractions Prescribed: 35
Plan Total Prescribed Dose: 70 Gy
Reference Point Dosage Given to Date: 54 Gy
Reference Point Session Dosage Given: 2 Gy
Session Number: 27

## 2023-01-25 LAB — PHOSPHORUS: Phosphorus: 4 mg/dL (ref 2.5–4.6)

## 2023-01-25 MED ORDER — SODIUM CHLORIDE 0.9 % IV SOLN
INTRAVENOUS | Status: DC
  Filled 2023-01-25 (×2): qty 250

## 2023-01-25 NOTE — Progress Notes (Signed)
[facility]  [facility] A Department of the [place]. [facility] [address], [address] [place], [place] [address] [phone] (phone) [phone] (fax)  Patient Care Team: [facility] as PCP - General   Name of the patient: Kenneth Gilmore  [id number]  May 31, 1951   Date of visit: 01/25/23  Diagnosis- SCC of tongue  Chief complaint/ Reason for visit- Malaise  Heme/Onc history:  Oncology History  Oropharyngeal cancer (HCC)  09/28/2022 Imaging   CT soft tissue neck with contrast showed infiltrating mass in the right tongue with multiple ipsilateral nodal metastases. The primary mass measures at least 4 cm and infiltrates parapharyngeal fat, visualization of tumor extent is limited by artifact from dental amalgam.   10/28/2022 Initial Diagnosis   Squamous cell carcinoma of tongue (HCC) Patient has throat pain and was evaluated by ENT Dr.Bennett.  Physical examination showed cervical lymphadenopathy.  10/15/2022 ultrasound-guided biopsy of right cervical lymph node showed fibrous tissue, detach minute salivary gland tissue.  Granular debris's and a few dysplastic squamous epithelial cells, nondiagnostic.  10/22/2022 repeat ultrasound-guided biopsy of right cervical lymph node was positive for malignancy, metastatic squamous cell carcinoma, keratinizing, in a background of dense fibrosis and tumor necrosis.   10/28/2022 Cancer Staging   Staging form: Oral Cavity, AJCC 8th Edition - Clinical: Stage IVA (cT3, cN2a, cM0) - Signed by [name], MD on 11/02/2022   11/16/2022 Imaging   PET scan showed 1. Large infiltrating right oropharyngeal mass is markedly hypermetabolic and consistent with known squamous cell carcinoma. 2. Bilateral hypermetabolic metastatic cervical lymphadenopathy. 3. No evidence of metastatic disease involving the chest, abdomen/pelvis or bony structures.     12/09/2022 -  Chemotherapy    Patient is on Treatment Plan : HEAD/NECK Cisplatin (40) q7d     01/03/2023 - 01/11/2023 Hospital Admission   Mr. Kenneth Gilmore is a 72 year old male with non-insulin-dependent diabetes mellitus, hyperlipidemia, history of DVT on Xarelto, oropharyngeal squamous cell carcinoma stage IVa, who presents emergency department from oncology clinic for chief concerns of syncope event at home while he was in the shower. High sensitive troponin is 2925.  Telemetry showed atrial flutter, he was initially given diltiazem gtt., and quickly converted to sinus. Patient also had a significant dysphagia from oropharyngeal cancer, G tube is placed on 5/17, TF started.  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  2 drug-eluting stents were placed.  Patient is placed on Plavix.     01/07/2023 Procedure   S/p gastrostomy tube.  placement.  5/22 G tube tip was broken and Luer-Lok tip was replaced in ER     Interval history- Patient is 72 year old male, diagnosed with stage IVa oropharyngeal SCC, currently receiving concurrent cisplatin chemtoherapy and radiation who presents to [facility] for complaints of fatigue and weakness. BP was found to be decreased. He continues tube feeds. Generally just feels tired and weak. Pain is well controlled. No nausea, vomiting, or diarrhea.   Review of systems- Review of Systems  Constitutional:  Positive for malaise/fatigue. Negative for chills, fever and weight loss.  HENT:  Positive for sore throat. Negative for tinnitus.   Respiratory:  Negative for cough, hemoptysis, shortness of breath and wheezing.   Cardiovascular:  Negative for chest pain, palpitations and leg swelling.  Gastrointestinal:  Negative for  abdominal pain, blood in stool, constipation, diarrhea, melena, nausea and  vomiting.  Genitourinary:  Negative for dysuria and urgency.  Musculoskeletal:  Negative for back pain, falls, joint pain and myalgias.  Skin:  Negative for itching and rash.  Neurological:  Positive for weakness. Negative for dizziness, tingling, sensory change, loss of consciousness and headaches.  Endo/Heme/Allergies:  Negative for environmental allergies. Does not bruise/bleed easily.  Psychiatric/Behavioral:  Negative for depression. The patient is not nervous/anxious and does not have insomnia.     No Known Allergies  Past Medical History:  Diagnosis Date   Diabetes mellitus without complication (HCC)    Hyperlipidemia    Hypertension    Oropharyngeal cancer (HCC)     Past Surgical History:  Procedure Laterality Date   arm surgery Left    CORONARY ANGIOPLASTY WITH STENT PLACEMENT     CORONARY STENT INTERVENTION N/A 01/10/2023   Procedure: CORONARY STENT INTERVENTION;  Surgeon: Iran Ouch, MD;  Location: [facility] INVASIVE CV LAB;  Service: Cardiovascular;  Laterality: N/A;   IR GASTROSTOMY TUBE MOD SED  01/07/2023   left side rotator cuff surgery     RIGHT/LEFT HEART CATH AND CORONARY ANGIOGRAPHY N/A 01/10/2023   Procedure: RIGHT/LEFT HEART CATH AND CORONARY ANGIOGRAPHY;  Surgeon: Iran Ouch, MD;  Location: [facility] INVASIVE CV LAB;  Service: Cardiovascular;  Laterality: N/A;   SHOULDER SURGERY      Social History   Socioeconomic History   Marital status: Single    Spouse name: Not on file   Number of children: Not on file   Years of education: Not on file   Highest education level: Not on file  Occupational History   Occupation: retired  Tobacco Use   Smoking status: Never   Smokeless tobacco: Current    Types: Chew  Substance and Sexual Activity   Alcohol use: No   Drug use: Never   Sexual activity: Not Currently  Other Topics Concern   Not on file  Social History Narrative   Not on file   Social Determinants of Health   Financial Resource Strain:  Low Risk  (12/27/2022)   Overall Financial Resource Strain (CARDIA)    Difficulty of Paying Living Expenses: Not hard at all  Food Insecurity: No Food Insecurity (01/06/2023)   Hunger Vital Sign    Worried About Running Out of Food in the Last Year: Never true    Ran Out of Food in the Last Year: Never true  Transportation Needs: No Transportation Needs (01/06/2023)   PRAPARE - Administrator, Civil Service (Medical): No    Lack of Transportation (Non-Medical): No  Physical Activity: Not on file  Stress: Stress Concern Present (12/27/2022)   [employer] of Occupational Health - Occupational Stress Questionnaire    Feeling of Stress : To some extent  Social Connections: Moderately Integrated (12/27/2022)   Social Connection and Isolation Panel [NHANES]    Frequency of Communication with Friends and Family: More than three times a week    Frequency of Social Gatherings with Friends and Family: More than three times a week    Attends Religious Services: More than 4 times per year    Active Member of [employer] or Organizations: Yes    Attends Banker Meetings: More than 4 times per year    Marital Status: Never married  Intimate Partner Violence: Not At Risk (01/06/2023)   Humiliation, Afraid, Rape, and Kick questionnaire    Fear of Current or Ex-Partner: No  Emotionally Abused: No    Physically Abused: No    Sexually Abused: No    Family History  Problem Relation Age of Onset   Cancer Father        lung     Current Outpatient Medications:    apixaban (ELIQUIS) 5 MG TABS tablet, Place 1 tablet (5 mg total) into feeding tube 2 (two) times daily., Disp: 60 tablet, Rfl: 0   atorvastatin (LIPITOR) 80 MG tablet, Place 1 tablet (80 mg total) into feeding tube daily., Disp: 30 tablet, Rfl: 0   clopidogrel (PLAVIX) 75 MG tablet, Place 1 tablet (75 mg total) into feeding tube daily with breakfast., Disp: 30 tablet, Rfl: 0   empagliflozin (JARDIANCE) 10 MG TABS tablet,  Take 1 tablet (10 mg total) by mouth daily., Disp: 30 tablet, Rfl: 0   metoprolol tartrate (LOPRESSOR) 25 MG tablet, Place 1 tablet (25 mg total) into feeding tube 2 (two) times daily., Disp: 60 tablet, Rfl: 0   Nutritional Supplements (FEEDING SUPPLEMENT, OSMOLITE 1.5 CAL,) LIQD, Give 2 cartons at 8am, noon, 4pm feeding via feeding tube.  Give 1 carton at 8pm via feeding tube.  Flush with 60ml water before and after each feeding.  Flush with additional TID between feedings to better meet hydration needs. Send bolus supplies., Disp: 1659 mL, Rfl: 0   oxyCODONE (OXY IR/ROXICODONE) 5 MG immediate release tablet, Place 1 tablet (5 mg total) into feeding tube every 6 (six) hours as needed for severe pain., Disp: 30 tablet, Rfl: 0   Protein (FEEDING SUPPLEMENT, PROSOURCE TF20,) liquid, Place 60 mLs into feeding tube 2 (two) times daily., Disp: , Rfl:    Water For Irrigation, Sterile (FREE WATER) SOLN, Place 200 mLs into feeding tube 4 (four) times daily - after meals and at bedtime., Disp: , Rfl:  No current facility-administered medications for this visit.  Facility-Administered Medications Ordered in Other Visits:    0.9 %  sodium chloride infusion, , Intravenous, Continuous, Alinda Dooms, NP, Last Rate: 999 mL/hr at 01/25/23 1250, New Bag at 01/25/23 1250  Physical exam:  Vitals:   01/25/23 1215  Temp: (!) 97.1 F (36.2 C)  TempSrc: Tympanic   Physical Exam Vitals reviewed.  Constitutional:      Comments: Fatigued appearing. Thin build. Unaccompanied.   HENT:     Mouth/Throat:     Mouth: Mucous membranes are dry.     Pharynx: Oropharyngeal exudate present.  Cardiovascular:     Rate and Rhythm: Normal rate and regular rhythm.  Pulmonary:     Effort: Pulmonary effort is normal.  Skin:    General: Skin is dry.     Coloration: Skin is pale.  Neurological:     Mental Status: He is oriented to person, place, and time.  Psychiatric:        Mood and Affect: Mood normal.         Behavior: Behavior normal.         Latest Ref Rng & Units 01/25/2023   12:12 PM  CMP  Glucose 70 - 99 mg/dL 811   BUN 8 - 23 mg/dL 30   Creatinine 9.14 - 1.24 mg/dL 7.82   Sodium 956 - 213 mmol/L 137   Potassium 3.5 - 5.1 mmol/L 5.1   Chloride 98 - 111 mmol/L 99   CO2 22 - 32 mmol/L 30   Calcium 8.9 - 10.3 mg/dL 8.9   Total Protein 6.5 - 8.1 g/dL 6.4   Total Bilirubin 0.3 - 1.2 mg/dL  0.5   Alkaline Phos 38 - 126 U/L 67   AST 15 - 41 U/L 27   ALT 0 - 44 U/L 34       Latest Ref Rng & Units 01/21/2023    8:23 AM  CBC  WBC 4.0 - 10.5 K/uL 5.3   Hemoglobin 13.0 - 17.0 g/dL 16.1   Hematocrit 09.6 - 52.0 % 34.2   Platelets 150 - 400 K/uL 499     No images are attached to the encounter.  CARDIAC CATHETERIZATION  Result Date: 01/10/2023   Ost RCA to Prox RCA lesion is 40% stenosed.   Mid RCA lesion is 40% stenosed.   RPAV lesion is 30% stenosed.   Mid LM to Dist LM lesion is 20% stenosed.   Mid LAD-2 lesion is 100% stenosed.   Mid LAD-1 lesion is 100% stenosed.   Lat 1st Diag lesion is 90% stenosed.   Prox Cx to Mid Cx lesion is 99% stenosed.   Ost Cx to Prox Cx lesion is 70% stenosed.   A drug-eluting stent was successfully placed using a STENT ONYX FRONTIER 2.25X38.   A drug-eluting stent was successfully placed using a STENT ONYX FRONTIER 2.5X12.   Post intervention, there is a 0% residual stenosis.   Post intervention, there is a 0% residual stenosis. 1.  Heavily calcified coronary arteries with severe two-vessel coronary artery disease.  Chronically occluded mid LAD stent with faint left to left collaterals.  Severe in-stent restenosis of the mid left circumflex as well as severe calcified stenosis proximally.  The RCA has moderate nonobstructive disease. 2.  Left ventricular angiography was not performed.  EF was moderately reduced by echo. 3.  Right heart catheterization showed low filling pressures, normal pulmonary pressure and normal cardiac output. 4.  Successful complex  angioplasty and 2 overlapped drug-eluting stent placement to the left circumflex extending to the ostium.  Difficult procedure due to diffuse calcified disease and difficulty delivering stents.  An extension guide catheter had to be used with predilation to high-pressure with a score flex balloon. Recommendations: Treat the rest of coronary artery disease medically. The patient was loaded with clopidogrel which should be continued for at least 1 year.  Aspirin can be discontinued once the patient is started on oral anticoagulation for LV thrombus and atrial flutter.  For now, will resume heparin drip in 4 hours.   IR GASTROSTOMY TUBE MOD SED  Result Date: 01/07/2023 INDICATION: 72 year old male with a history of dysphagia EXAM: PERC PLACEMENT GASTROSTOMY MEDICATIONS: 2 g Ancef; Antibiotics were administered within 1 hour of the procedure. ANESTHESIA/SEDATION: Versed 2.0 mg IV; Fentanyl 50 mcg IV Moderate Sedation Time:  12 minutes The patient was continuously monitored during the procedure by the interventional radiology nurse under my direct supervision. CONTRAST:  10 cc-administered into the gastric lumen. FLUOROSCOPY: Radiation Exposure Index (as provided by the fluoroscopic device): 13 mGy Kerma COMPLICATIONS: None PROCEDURE: Informed written consent was obtained from the patient and the patient's family after a thorough discussion of the procedural risks, benefits and alternatives. All questions were addressed. Maximal Sterile Barrier Technique was utilized including caps, mask, sterile gowns, sterile gloves, sterile drape, hand hygiene and skin antiseptic. A timeout was performed prior to the initiation of the procedure. The epigastrium was prepped with Betadine in a sterile fashion, and a sterile drape was applied covering the operative field. A sterile gown and sterile gloves were used for the procedure. A 5-French orogastric tube is placed under fluoroscopic guidance. Scout imaging of  the abdomen  confirms barium within the transverse colon. The stomach was distended with gas. Under fluoroscopic guidance, an 18 gauge needle was utilized to puncture the anterior wall of the body of the stomach. An Amplatz wire was advanced through the needle passing a T fastener into the lumen of the stomach. The T fastener was secured for gastropexy. A 9-French sheath was inserted. A snare was advanced through the 9-French sheath. A Teena Dunk was advanced through the orogastric tube. It was snared then pulled out the oral cavity, pulling the snare, as well. The leading edge of the gastrostomy was attached to the snare. It was then pulled down the esophagus and out the percutaneous site. Tube secured in place. Contrast was injected. Patient tolerated the procedure well and remained hemodynamically stable throughout. No complications were encountered and no significant blood loss encountered. IMPRESSION: Status post fluoroscopic placed percutaneous gastrostomy tube, with 20 [place] pull-through. Signed, Yvone Neu. Loreta Ave, DO [facility] Electronically Signed   By: Gilmer Mor D.O.   On: 01/07/2023 14:13   ECHOCARDIOGRAM COMPLETE  Result Date: 01/04/2023    ECHOCARDIOGRAM REPORT   Patient Name:   NOBEL SHADE Date of Exam: 01/03/2023 Medical Rec #:  [id number]       Height:       69.0 in Accession #:    [id number]      Weight:       172.0 lb Date of Birth:  05-04-1951        BSA:          1.938 m Patient Age:    72 years        BP:           105/55 mmHg Patient Gender: M               HR:           76 bpm. Exam Location:  [facility] Procedure: 2D Echo, Cardiac Doppler, Color Doppler and Intracardiac            Opacification Agent Indications:     I48.92 Atrial Flutter  History:         Patient has no prior history of Echocardiogram examinations.                  Risk Factors:Hypertension, Dyslipidemia and Diabetes.  Sonographer:     Daphine Deutscher RDCS Referring Phys:   [id number] AMY N COX Diagnosing Phys: Yvonne Kendall MD IMPRESSIONS  1. There is a 3.3 x 1.7 cm mural thrombus near the left ventricular apex. Left ventricular ejection fraction, by estimation, is 35 to 40%. The left ventricle has moderately decreased function. The left ventricle demonstrates regional wall motion abnormalities (see scoring diagram/findings for description). Left ventricular diastolic parameters are consistent with Grade I diastolic dysfunction (impaired relaxation). There is akinesis of the left ventricular, mid-apical anterior wall and anteroseptal wall. There is akinesis of the left ventricular, apical inferior segment. There is akinesis of the left ventricular, apical segment.  2. Right ventricular systolic function is normal. The right ventricular size is mildly enlarged. There is normal pulmonary artery systolic pressure.  3. The mitral valve is abnormal. Trivial mitral valve regurgitation. No evidence of mitral stenosis.  4. The aortic valve has an indeterminant number of cusps. There is moderate thickening of the aortic valve. Aortic valve regurgitation is trivial. Aortic valve sclerosis/calcification is present, without any evidence of aortic stenosis.  5. The inferior vena cava is normal in  size with greater than 50% respiratory variability, suggesting right atrial pressure of 3 mmHg. FINDINGS  Left Ventricle: There is a 3.3 x 1.7 cm mural thrombus near the left ventricular apex. Left ventricular ejection fraction, by estimation, is 35 to 40%. The left ventricle has moderately decreased function. The left ventricle demonstrates regional wall motion abnormalities. Definity contrast agent was given IV to delineate the left ventricular endocardial borders. The left ventricular internal cavity size was normal in size. There is no left ventricular hypertrophy. Left ventricular diastolic parameters are consistent with Grade I diastolic dysfunction (impaired relaxation). Right Ventricle: The right  ventricular size is mildly enlarged. No increase in right ventricular wall thickness. Right ventricular systolic function is normal. There is normal pulmonary artery systolic pressure. The tricuspid regurgitant velocity is 2.86  m/s, and with an assumed right atrial pressure of 3 mmHg, the estimated right ventricular systolic pressure is 35.7 mmHg. Left Atrium: Left atrial size was normal in size. Right Atrium: Right atrial size was normal in size. Pericardium: There is no evidence of pericardial effusion. Mitral Valve: The mitral valve is abnormal. There is mild thickening of the anterior mitral valve leaflet(s). Trivial mitral valve regurgitation. No evidence of mitral valve stenosis. Tricuspid Valve: The tricuspid valve is normal in structure. Tricuspid valve regurgitation is mild. Aortic Valve: The aortic valve has an indeterminant number of cusps. There is moderate thickening of the aortic valve. Aortic valve regurgitation is trivial. Aortic valve sclerosis/calcification is present, without any evidence of aortic stenosis. Aortic  valve mean gradient measures 6.5 mmHg. Aortic valve peak gradient measures 12.9 mmHg. Aortic valve area, by VTI measures 1.52 cm. Pulmonic Valve: The pulmonic valve was not well visualized. Pulmonic valve regurgitation is trivial. No evidence of pulmonic stenosis. Aorta: The aortic root and ascending aorta are structurally normal, with no evidence of dilitation. Pulmonary Artery: The pulmonary artery is of normal size. Venous: The inferior vena cava is normal in size with greater than 50% respiratory variability, suggesting right atrial pressure of 3 mmHg. IAS/Shunts: The interatrial septum was not well visualized.  LEFT VENTRICLE PLAX 2D LVIDd:         4.80 cm   Diastology LVIDs:         3.70 cm   LV e' medial:    6.64 cm/s LV PW:         1.00 cm   LV E/e' medial:  11.9 LV IVS:        1.00 cm   LV e' lateral:   7.56 cm/s LVOT diam:     2.00 cm   LV E/e' lateral: 10.5 LV SV:          54 LV SV Index:   28 LVOT Area:     3.14 cm  RIGHT VENTRICLE             IVC RV Basal diam:  4.30 cm     IVC diam: 1.20 cm RV S prime:     18.20 cm/s TAPSE (M-mode): 2.8 cm LEFT ATRIUM             Index        RIGHT ATRIUM           Index LA diam:        4.50 cm 2.32 cm/m   RA Area:     10.70 cm LA Vol (A2C):   40.1 ml 20.70 ml/m  RA Volume:   24.10 ml  12.44 ml/m LA Vol (A4C):   53.3 ml  27.51 ml/m LA Biplane Vol: 46.0 ml 23.74 ml/m  AORTIC VALVE AV Area (Vmax):    1.55 cm AV Area (Vmean):   1.46 cm AV Area (VTI):     1.52 cm AV Vmax:           179.50 cm/s AV Vmean:          120.500 cm/s AV VTI:            0.359 m AV Peak Grad:      12.9 mmHg AV Mean Grad:      6.5 mmHg LVOT Vmax:         88.77 cm/s LVOT Vmean:        56.100 cm/s LVOT VTI:          0.173 m LVOT/AV VTI ratio: 0.48  AORTA Ao Root diam: 3.40 cm Ao Asc diam:  3.40 cm MITRAL VALVE                TRICUSPID VALVE MV Area (PHT): 3.42 cm     TR Peak grad:   32.7 mmHg MV Decel Time: 222 msec     TR Vmax:        286.00 cm/s MV E velocity: 79.20 cm/s MV A velocity: 111.00 cm/s  SHUNTS MV E/A ratio:  0.71         Systemic VTI:  0.17 m                             Systemic Diam: 2.00 cm Yvonne Kendall MD Electronically signed by Yvonne Kendall MD Signature Date/Time: 01/04/2023/10:24:53 AM    Final    DG Chest Port 1 View  Result Date: 01/03/2023 CLINICAL DATA:  Syncope EXAM: PORTABLE CHEST 1 VIEW COMPARISON:  X-ray 12/30/2022 FINDINGS: Hyperinflation. Normal cardiopericardial silhouette when adjusting for technique. No consolidation, pneumothorax, effusion or edema. Overlapping cardiac leads. Degenerative changes of the spine. Fixation pin along the distal left clavicle. IMPRESSION: Hyperinflation.  No acute cardiopulmonary disease. Electronically Signed   By: Karen Kays M.D.   On: 01/03/2023 15:10   CT Head Wo Contrast  Result Date: 01/03/2023 CLINICAL DATA:  Trauma.  History of head neck cancer. EXAM: CT HEAD WITHOUT CONTRAST CT CERVICAL  SPINE WITHOUT CONTRAST TECHNIQUE: Multidetector CT imaging of the head and cervical spine was performed following the standard protocol without intravenous contrast. Multiplanar CT image reconstructions of the cervical spine were also generated. RADIATION DOSE REDUCTION: This exam was performed according to the departmental dose-optimization program which includes automated exposure control, adjustment of the mA and/or kV according to patient size and/or use of iterative reconstruction technique. COMPARISON:  MR brain 03/29/2020 FINDINGS: CT HEAD FINDINGS Brain: No evidence of acute infarction, hemorrhage, hydrocephalus, extra-axial collection or mass lesion/mass effect. There is heterogeneous low-attenuation within the subcortical and periventricular white matter compatible with chronic microvascular disease. Vascular: No hyperdense vessel or unexpected calcification. Skull: Normal. Negative for fracture or focal lesion. Sinuses/Orbits: No acute finding. Other: None. CT CERVICAL SPINE FINDINGS Alignment: The alignment of the cervical spine is normal. Skull base and vertebrae: No signs of acute fracture or subluxation. Soft tissues and spinal canal: No prevertebral fluid or swelling. No visible canal hematoma. Disc levels: There are large, bulky, flowing ventral syndesmophytes throughout the cervical spine. Disc space narrowing and calcification identified at C3-4. There is also disc space narrowing at C5-6 and C7-T1. Mild bilateral facet arthropathy. Upper chest: Right base of tongue/oropharyngeal mass is again identified. This  is only partially visualized on the current study, image 39/4. Other: None IMPRESSION: 1. No evidence for acute intracranial abnormality. 2. Chronic microvascular disease. 3. No evidence for acute cervical spine fracture or subluxation. 4. 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 5. 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 This is only partially  visualized on the current study. See PET-CT report from 11/15/2022 for details. Electronically Signed   By: Signa Kell M.D.   On: 01/03/2023 13:37   CT Cervical Spine Wo Contrast  Result Date: 01/03/2023 CLINICAL DATA:  Trauma.  History of head neck cancer. EXAM: CT HEAD WITHOUT CONTRAST CT CERVICAL SPINE WITHOUT CONTRAST TECHNIQUE: Multidetector CT imaging of the head and cervical spine was performed following the standard protocol without intravenous contrast. Multiplanar CT image reconstructions of the cervical spine were also generated. RADIATION DOSE REDUCTION: This exam was performed according to the departmental dose-optimization program which includes automated exposure control, adjustment of the mA and/or kV according to patient size and/or use of iterative reconstruction technique. COMPARISON:  MR brain 03/29/2020 FINDINGS: CT HEAD FINDINGS Brain: No evidence of acute infarction, hemorrhage, hydrocephalus, extra-axial collection or mass lesion/mass effect. There is heterogeneous low-attenuation within the subcortical and periventricular white matter compatible with chronic microvascular disease. Vascular: No hyperdense vessel or unexpected calcification. Skull: Normal. Negative for fracture or focal lesion. Sinuses/Orbits: No acute finding. Other: None. CT CERVICAL SPINE FINDINGS Alignment: The alignment of the cervical spine is normal. Skull base and vertebrae: No signs of acute fracture or subluxation. Soft tissues and spinal canal: No prevertebral fluid or swelling. No visible canal hematoma. Disc levels: There are large, bulky, flowing ventral syndesmophytes throughout the cervical spine. Disc space narrowing and calcification identified at C3-4. There is also disc space narrowing at C5-6 and C7-T1. Mild bilateral facet arthropathy. Upper chest: Right base of tongue/oropharyngeal mass is again identified. This is only partially visualized on the current study, image 39/4. Other: None  IMPRESSION: 1. No evidence for acute intracranial abnormality. 2. Chronic microvascular disease. 3. No evidence for acute cervical spine fracture or subluxation. 4. 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 5. 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 This is only partially visualized on the current study. See PET-CT report from 11/15/2022 for details. Electronically Signed   By: Signa Kell M.D.   On: 01/03/2023 13:37   DG Chest 2 View  Result Date: 12/30/2022 CLINICAL DATA:  Provided history: Aspiration, weakness. EXAM: CHEST - 2 VIEW COMPARISON:  PET CT 11/15/2022. FINDINGS: Heart size within normal limits. No appreciable airspace consolidation. No evidence of pleural effusion or pneumothorax. No acute osseous abnormality identified. No acute osseous abnormality identified. Dextrocurvature of the thoracic spine. Metallic screw within the distal left clavicle. IMPRESSION: No evidence of active cardiopulmonary disease. Electronically Signed   By: Jackey Loge D.O.   On: 12/30/2022 10:04    Assessment and plan- Patient is a 72 y.o. male currently undergoing concurrent cisplatin chemotherapy and radiation for SCC of oropharynx who presents to symptom management clinic for complaints of   Weakness and lethargy- likely related to treatment and soft blood pressure. Labs overall are stable. IV fluids in clinic today. BP 90s/50s post fluids which I suspect is secondary to his cardiac medications. Recommend he discuss with cardiology to see if medication could be reduced or held to improve his clinical symptoms.   Follow up as needed. Return to clinic sooner if symptoms odn't improve or worsen.    Visit Diagnosis 1. Convalescence following  chemotherapy   2. Convalescence after radiotherapy     Patient expressed understanding and was in agreement with this plan. He also understands that He can call clinic at any time with any questions, concerns, or complaints.   Thank  you for allowing me to participate in the care of this very pleasant patient.   Consuello Masse, DNP, AGNP-C, AOCNP [facility] [phone]

## 2023-01-26 ENCOUNTER — Other Ambulatory Visit: Payer: Self-pay

## 2023-01-26 ENCOUNTER — Telehealth: Payer: Self-pay | Admitting: Internal Medicine

## 2023-01-26 ENCOUNTER — Ambulatory Visit
Admission: RE | Admit: 2023-01-26 | Discharge: 2023-01-26 | Disposition: A | Discharge status: Home / Self Care | Payer: Medicare Other | Source: Ambulatory Visit | Attending: Radiation Oncology | Admitting: Radiation Oncology

## 2023-01-26 ENCOUNTER — Ambulatory Visit: Payer: Medicare Other | Attending: Radiation Oncology | Admitting: Speech Pathology

## 2023-01-26 DIAGNOSIS — G893 Neoplasm related pain (acute) (chronic): Secondary | ICD-10-CM | POA: Diagnosis not present

## 2023-01-26 DIAGNOSIS — I2510 Atherosclerotic heart disease of native coronary artery without angina pectoris: Secondary | ICD-10-CM | POA: Diagnosis not present

## 2023-01-26 DIAGNOSIS — R252 Cramp and spasm: Secondary | ICD-10-CM | POA: Diagnosis not present

## 2023-01-26 DIAGNOSIS — Z86718 Personal history of other venous thrombosis and embolism: Secondary | ICD-10-CM | POA: Diagnosis not present

## 2023-01-26 DIAGNOSIS — C109 Malignant neoplasm of oropharynx, unspecified: Secondary | ICD-10-CM | POA: Insufficient documentation

## 2023-01-26 DIAGNOSIS — R1310 Dysphagia, unspecified: Secondary | ICD-10-CM | POA: Diagnosis not present

## 2023-01-26 DIAGNOSIS — R471 Dysarthria and anarthria: Secondary | ICD-10-CM | POA: Insufficient documentation

## 2023-01-26 DIAGNOSIS — C029 Malignant neoplasm of tongue, unspecified: Secondary | ICD-10-CM | POA: Diagnosis not present

## 2023-01-26 DIAGNOSIS — I4891 Unspecified atrial fibrillation: Secondary | ICD-10-CM | POA: Diagnosis not present

## 2023-01-26 DIAGNOSIS — F17220 Nicotine dependence, chewing tobacco, uncomplicated: Secondary | ICD-10-CM | POA: Diagnosis not present

## 2023-01-26 DIAGNOSIS — Z48812 Encounter for surgical aftercare following surgery on the circulatory system: Secondary | ICD-10-CM | POA: Diagnosis not present

## 2023-01-26 DIAGNOSIS — I214 Non-ST elevation (NSTEMI) myocardial infarction: Secondary | ICD-10-CM | POA: Diagnosis not present

## 2023-01-26 DIAGNOSIS — Z79899 Other long term (current) drug therapy: Secondary | ICD-10-CM | POA: Diagnosis not present

## 2023-01-26 DIAGNOSIS — C770 Secondary and unspecified malignant neoplasm of lymph nodes of head, face and neck: Secondary | ICD-10-CM | POA: Diagnosis not present

## 2023-01-26 DIAGNOSIS — R1312 Dysphagia, oropharyngeal phase: Secondary | ICD-10-CM | POA: Insufficient documentation

## 2023-01-26 DIAGNOSIS — Z5111 Encounter for antineoplastic chemotherapy: Secondary | ICD-10-CM | POA: Diagnosis not present

## 2023-01-26 DIAGNOSIS — Z7982 Long term (current) use of aspirin: Secondary | ICD-10-CM | POA: Diagnosis not present

## 2023-01-26 DIAGNOSIS — Z510 Encounter for antineoplastic radiation therapy: Secondary | ICD-10-CM | POA: Diagnosis not present

## 2023-01-26 DIAGNOSIS — I4892 Unspecified atrial flutter: Secondary | ICD-10-CM | POA: Diagnosis not present

## 2023-01-26 LAB — RAD ONC ARIA SESSION SUMMARY
Course Elapsed Days: 48
Plan Fractions Treated to Date: 28
Plan Prescribed Dose Per Fraction: 2 Gy
Plan Total Fractions Prescribed: 35
Plan Total Prescribed Dose: 70 Gy
Reference Point Dosage Given to Date: 56 Gy
Reference Point Session Dosage Given: 2 Gy
Session Number: 28

## 2023-01-26 MED ORDER — METOPROLOL TARTRATE 25 MG PO TABS: 12.5000 mg | ORAL_TABLET | Freq: Two times a day (BID) | ORAL | 0 refills | Status: AC

## 2023-01-26 NOTE — Therapy (Signed)
OUTPATIENT SPEECH LANGUAGE PATHOLOGY  TREATMENT NOTE   Patient Name: Kenneth Gilmore MRN: [id number] DOB:Jan 19, 1951, 72 y.o., male Today's Date: 01/26/2023  PCP: [facility], [place] REFERRING PROVIDER: Laurette Schimke, NP  END OF SESSION:  End of Session - 01/26/23 1224     Visit Number 5    Number of Visits 17    Date for SLP Re-Evaluation 02/09/23    Authorization Type [employer]    Progress Note Due on Visit 10    SLP Start Time 1147    SLP Stop Time  1210    SLP Time Calculation (min) 23 min    Activity Tolerance Patient tolerated treatment well             Past Medical History:  Diagnosis Date   Diabetes mellitus without complication (HCC)    Hyperlipidemia    Hypertension    Oropharyngeal cancer (HCC)    Past Surgical History:  Procedure Laterality Date   arm surgery Left    CORONARY ANGIOPLASTY WITH STENT PLACEMENT     CORONARY STENT INTERVENTION N/A 01/10/2023   Procedure: CORONARY STENT INTERVENTION;  Surgeon: Iran Ouch, MD;  Location: [facility] INVASIVE CV LAB;  Service: Cardiovascular;  Laterality: N/A;   IR GASTROSTOMY TUBE MOD SED  01/07/2023   left side rotator cuff surgery     RIGHT/LEFT HEART CATH AND CORONARY ANGIOGRAPHY N/A 01/10/2023   Procedure: RIGHT/LEFT HEART CATH AND CORONARY ANGIOGRAPHY;  Surgeon: Iran Ouch, MD;  Location: [facility] INVASIVE CV LAB;  Service: Cardiovascular;  Laterality: N/A;   SHOULDER SURGERY     Patient Active Problem List   Diagnosis Date Noted   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) 01/25/2023   Essential tremor 01/25/2023   Pure hypercholesterolemia, unspecified 01/25/2023   Benign hypertension 01/25/2023   Type 2 diabetes mellitus (HCC) 01/25/2023   Long term (current) use of anticoagulants 01/25/2023   Exposure to potentially hazardous substance 01/25/2023   Atherosclerosis of coronary artery without angina pectoris 01/25/2023   Chronic ischemic heart disease 01/25/2023   S/P  percutaneous endoscopic gastrostomy (PEG) tube placement (HCC) 01/13/2023   CAD, multiple vessel 01/13/2023   Pancytopenia (HCC) 01/07/2023   Poor nutrition 01/06/2023   Dilated cardiomyopathy (HCC) 01/06/2023   Hypomagnesemia 01/05/2023   Hypokalemia 01/05/2023   LV (left ventricular) mural thrombus 01/05/2023   Chronic systolic heart failure (HCC) 01/05/2023   Thrombocythemia 01/05/2023   Palliative care encounter 01/04/2023   NSTEMI (non-ST elevated myocardial infarction) (HCC) 01/03/2023   Diabetes mellitus type 2, noninsulin dependent (HCC) 01/03/2023   Protein-calorie malnutrition, severe (HCC) 01/03/2023   Benign essential tremor 01/03/2023   Atrial flutter (HCC) 01/03/2023   Hypotension 01/03/2023   Syncope and collapse 01/03/2023   Trismus 12/23/2022   Dysphagia 12/16/2022   Thrush 12/16/2022   Encounter for antineoplastic chemotherapy 12/09/2022   Neoplasm related pain 12/09/2022   Goals of care, counseling/discussion 11/02/2022   History of DVT (deep vein thrombosis) 11/02/2022   Oropharyngeal cancer (HCC) 10/28/2022   Hyperlipidemia 04/01/2014   Systemic primary arterial hypertension 04/01/2014    ONSET DATE: 10/28/2022 date of diagnosis;  12/14/2022 date of referral  REFERRING DIAG: C10.9 (ICD-10-CM) - Oropharyngeal cancer (HCC)   THERAPY DIAG:  Trismus  Dysphagia, oropharyngeal phase  Oropharyngeal cancer (HCC)  Dysarthria and anarthria  Rationale for Evaluation and Treatment: Rehabilitation  SUBJECTIVE:   PERTINENT HISTORY:  Pt is a 72 year old male with past medical history of DVT who is currently undergoing concurrent chemoradiation treatment  for at 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   DIAGNOSTIC FINDINGS:  CT Soft tissue neck - 09/30/2022 Infiltrating mass in the right tongue measuring  up to 4 cm. Prominent submucosal involvement including in the right  parapharyngeal fat. Assessment of relationship to the pterygoids and   extrinsic tongue musculature is somewhat hindered by streak artifact  from dental amalgam.   US biopsy - 10/22/2022 Lymph node, right cervical: Positive for malignancy; Metastatic Squamous Cell Carcinoma, Keratinizing, in a background of dense fibrosis and tumor necrosis An immunohistochemical study directed against p16 is strongly and  diffusely positive, indicating an HPV driven malignancy.   PAIN:  Are you having pain? Yes: NPRS scale: 8/10 Pain location: right tongue and jaw Pain description: pt with difficulty describing Aggravating factors: none Relieving factors: heat  FALLS: Has patient fallen in last 6 months?  No  LIVING ENVIRONMENT: Lives with: lives alone Lives in: House/apartment  PLOF:  Level of assistance: Independent with ADLs, Independent with IADLs Employment: Retired  PATIENT GOALS: to improve mandibular opening  SUBJECTIVE STATEMENT: Pt voices being overwhelmed and fatigued, but with improved speech intelligibility Pt accompanied by: self  OBJECTIVE:   TODAY'S TREATMENT Skilled treatment session focused on pt's dysphagia, speech intelligibility and trismus goals. SLP facilitated session by providing the following interventions:  Since last session, pt with hospitalization. He had a cardiac cath and PEG placement while hospitalized (01/03/2023 - 01/11/2023). He reports current fatigue as well as being overall overwhelmed but multiple medical issues and ongoing chemoradiation.    SLP facilitated today's session by providing verbal and written instructions for cervical ROM exercises. Pt currently presents with minimal but improved cervical ROM. Extensive education provided that Neck range of motion exercises should be done to the point of feeling a GENTLE, TOLERABLE stretch only. Demonstration provided with pt able to imitate for the following stretches: Head Tilt: Forward and Back - Gently bow your head and try to touch your chin to your chest. Raise your  chin back to the starting position. Tilt your head back as far as possible so you are looking up at the ceiling. Return your head to the starting position. - pt with some movement noted when bowing head down, improved minimal movement noted when tilting head back Head Tilt: Side to Side: Tilt your head to the side, bringing your ear toward your shoulder. Do not raise your shoulder to your ear. Keep your shoulder still. Return your head to the starting position. - minimal movement noted bilaterally Head turns: Turn your head to look over your shoulder. Tilt your chin down and try to touch it to your shoulder. Do not raise your shoulder to your chin. Face forward again - minimal movement noted bilaterally  Water Protocol - pt instructed to brush his teeth and consume ice chips to aid in perseveration of swallow musculature and to thin oral secretions  Trismus - pt with noted improved speech intelligibility and lingual ROM. Measurement taken with improved MIO of 25mm - much improved over previous measurement on 12/22/2022 of 15mm.   PATIENT EDUCATION: Education details: see above Person educated: Patient Education method: Explanation Education comprehension: verbalized understanding and needs further education  Research states the risk for dysphagia increases due to radiation and/or chemotherapy treatment due to a variety of factors, so SLP educated the pt about the possibility of reduced/limited ability for PO intake during rad tx. SLP also educated pt regarding possible changes to swallowing musculature after rad tx, and why adherence to dysphagia  HEP provided today and PO consumption was necessary to inhibit muscle fibrosis following rad tx and to mitigate muscle disuse atrophy. SLP informed pt why this would be detrimental to their swallowing status and to their pulmonary health. Pt demonstrated understanding of these things to SLP. SLP encouraged pt to safely eat and drink as deep into their  radiation/chemotherapy as possible to provide the best possible long-term swallowing outcome for pt.    GOALS: Goals reviewed with patient? Yes  SHORT TERM GOALS: Target date: 10 sessions  Patient will participate in objective swallowing evaluation (MBSS) to identify safest diet recommendation as well as therapeutic targets. Baseline: Goal status: INITIAL  2.  The patient will achieve 90% intelligibility with use of compensatory speech intelligibility strategies while reading aloud "The Grandfather Passage" and "The Rainbow Passage" to an unfamiliar listener.  Baseline:  Goal status: INITIAL  3.  Pt will demonstrate a return to full cervical ROM and function post operatively compared to baselines and not demonstrate any signs or symptoms of lymphedema.  Baseline:  Goal status: INITIAL  LONG TERM GOALS: Target date: 02/09/2023  Pt will be able to verbalize understanding of a home exercise program for pharyngeal, trismus and cervical range of motion.  Baseline:  Goal status: INITIAL  2.  Patient will improve perception of swallowing as indicated by an improvement in EAT-10 score to 5 (Baseline = 10) by 12 weeks from initial swallowing therapy session. Baseline:  Goal status: INITIAL  3.  The patient will increase his maximal jaw opening by 10 mm to achieve enhanced speech intelligibility as evidenced by an improved score on a standardized dysarthria assessment.  Baseline:  Goal status: INITIAL  ASSESSMENT:  CLINICAL IMPRESSION: Pt is a 72 year old male who was seen today for a swallowing treatment d/t oropharyngeal cancer resulting in severe trismus and severe oropharyngeal dysphagia. See above treatment note for details. Pt presents with improved trismus with MIO of 25mm, much improved over 15mm on 12/22/2022.  Data indicate that pt's swallow ability will likely decrease over the course of radiation/chemoradiation therapy and could very well decline over time following the  conclusion of that therapy due to muscle disuse atrophy and/or muscle fibrosis. Pt will continue to need to be seen by SLP in order to assess safety of PO intake, assess the need for recommending any objective swallow assessment, and ensuring pt is correctly completing the individualized HEP.   OBJECTIVE IMPAIRMENTS: include dysarthria, dysphagia, and trismus. These impairments are limiting patient from effectively communicating at home and in community and safety when swallowing. Factors affecting potential to achieve goals and functional outcome are medical prognosis and poor health literacy. Patient will benefit from skilled SLP services to address above impairments and improve overall function.  REHAB POTENTIAL: Good  PLAN:  SLP FREQUENCY: 1-2x/week  SLP DURATION: 8 weeks  PLANNED INTERVENTIONS: Aspiration precaution training, Pharyngeal strengthening exercises, Diet toleration management , Trials of upgraded texture/liquids, SLP instruction and feedback, Compensatory strategies, and Patient/family education   Michaline Kindig B. Dreama Saa, M.S., CCC-SLP, Tree surgeon Certified Brain Injury Specialist [facility]  [facility] Rehabilitation Services Office [phone] Ascom [phone] Fax [phone]

## 2023-01-26 NOTE — Telephone Encounter (Signed)
Pt c/o medication issue:  1. Name of Medication:   metoprolol tartrate (LOPRESSOR) 25 MG tablet    2. How are you currently taking this medication (dosage and times per day)? Place 1 tablet (25 mg total) into feeding tube 2 (two) times daily.   3. Are you having a reaction (difficulty breathing--STAT)? No  4. What is your medication issue? Pt's provider at the [facility] wanted to make provider aware that above medication may need to be decreased due to pt's BP running low. She states that BP has been running 90/50's (low 50's). She would like for pt to be called regarding this matter. Please advise

## 2023-01-26 NOTE — Telephone Encounter (Signed)
Spoke with patient and informed him of the provider's recommendations as follows:  "I believe Dr. Kirke Corin will be Kenneth Gilmore's primary cardiologist.  I recommend that Kenneth Gilmore decrease metoprolol tartrate to 12.5 mg twice daily and follow-up with Eula Listen, PA, later this month as scheduled."  Patient understood with read back

## 2023-01-26 NOTE — Telephone Encounter (Signed)
I believe Dr. Kirke Corin will be Kenneth Gilmore's primary cardiologist.  I recommend that Kenneth Gilmore decrease metoprolol tartrate to 12.5 mg twice daily and follow-up with Eula Listen, PA, later this month as scheduled.

## 2023-01-27 ENCOUNTER — Ambulatory Visit: Payer: Medicare Other

## 2023-01-27 ENCOUNTER — Ambulatory Visit
Admission: RE | Admit: 2023-01-27 | Discharge: 2023-01-27 | Disposition: A | Discharge status: Home / Self Care | Payer: Medicare Other | Source: Ambulatory Visit | Attending: Radiation Oncology | Admitting: Radiation Oncology

## 2023-01-27 ENCOUNTER — Inpatient Hospital Stay: Payer: Medicare Other

## 2023-01-27 ENCOUNTER — Inpatient Hospital Stay: Payer: Medicare Other | Admitting: Oncology

## 2023-01-27 ENCOUNTER — Encounter: Payer: Self-pay | Admitting: Oncology

## 2023-01-27 ENCOUNTER — Other Ambulatory Visit: Payer: Self-pay

## 2023-01-27 VITALS — BP 103/67 | HR 73 | Temp 96.0°F | Resp 18 | Wt 170.9 lb

## 2023-01-27 DIAGNOSIS — I513 Intracardiac thrombosis, not elsewhere classified: Secondary | ICD-10-CM | POA: Diagnosis not present

## 2023-01-27 DIAGNOSIS — Z931 Gastrostomy status: Secondary | ICD-10-CM

## 2023-01-27 DIAGNOSIS — Z5111 Encounter for antineoplastic chemotherapy: Secondary | ICD-10-CM | POA: Diagnosis not present

## 2023-01-27 DIAGNOSIS — C109 Malignant neoplasm of oropharynx, unspecified: Secondary | ICD-10-CM

## 2023-01-27 DIAGNOSIS — R1310 Dysphagia, unspecified: Secondary | ICD-10-CM | POA: Diagnosis not present

## 2023-01-27 DIAGNOSIS — E43 Unspecified severe protein-calorie malnutrition: Secondary | ICD-10-CM | POA: Diagnosis not present

## 2023-01-27 DIAGNOSIS — Z7982 Long term (current) use of aspirin: Secondary | ICD-10-CM | POA: Diagnosis not present

## 2023-01-27 DIAGNOSIS — G893 Neoplasm related pain (acute) (chronic): Secondary | ICD-10-CM | POA: Diagnosis not present

## 2023-01-27 DIAGNOSIS — C770 Secondary and unspecified malignant neoplasm of lymph nodes of head, face and neck: Secondary | ICD-10-CM | POA: Diagnosis not present

## 2023-01-27 DIAGNOSIS — C029 Malignant neoplasm of tongue, unspecified: Secondary | ICD-10-CM | POA: Diagnosis not present

## 2023-01-27 DIAGNOSIS — F17220 Nicotine dependence, chewing tobacco, uncomplicated: Secondary | ICD-10-CM | POA: Diagnosis not present

## 2023-01-27 DIAGNOSIS — Z79899 Other long term (current) drug therapy: Secondary | ICD-10-CM | POA: Diagnosis not present

## 2023-01-27 DIAGNOSIS — Z86718 Personal history of other venous thrombosis and embolism: Secondary | ICD-10-CM | POA: Diagnosis not present

## 2023-01-27 DIAGNOSIS — Z510 Encounter for antineoplastic radiation therapy: Secondary | ICD-10-CM | POA: Diagnosis not present

## 2023-01-27 LAB — RAD ONC ARIA SESSION SUMMARY
Course Elapsed Days: 49
Plan Fractions Treated to Date: 29
Plan Prescribed Dose Per Fraction: 2 Gy
Plan Total Fractions Prescribed: 35
Plan Total Prescribed Dose: 70 Gy
Reference Point Dosage Given to Date: 58 Gy
Reference Point Session Dosage Given: 2 Gy
Session Number: 29

## 2023-01-27 LAB — CMP (CANCER CENTER ONLY)
ALT: 31 U/L (ref 0–44)
AST: 25 U/L (ref 15–41)
Albumin: 3.4 g/dL — ABNORMAL LOW (ref 3.5–5.0)
Alkaline Phosphatase: 67 U/L (ref 38–126)
Anion gap: 11 (ref 5–15)
BUN: 21 mg/dL (ref 8–23)
CO2: 29 mmol/L (ref 22–32)
Calcium: 8.9 mg/dL (ref 8.9–10.3)
Chloride: 97 mmol/L — ABNORMAL LOW (ref 98–111)
Creatinine: 0.72 mg/dL (ref 0.61–1.24)
GFR, Estimated: >60 mL/min (ref >60)
Glucose, Bld: 293 mg/dL — ABNORMAL HIGH (ref 70–99)
Potassium: 4.9 mmol/L (ref 3.5–5.1)
Sodium: 137 mmol/L (ref 135–145)
Total Bilirubin: 0.4 mg/dL (ref 0.3–1.2)
Total Protein: 6.2 g/dL — ABNORMAL LOW (ref 6.5–8.1)

## 2023-01-27 LAB — CBC WITH DIFFERENTIAL (CANCER CENTER ONLY)
Abs Immature Granulocytes: 0.06 10*3/uL (ref 0.00–0.07)
Basophils Absolute: 0.1 10*3/uL (ref 0.0–0.1)
Basophils Relative: 1 %
Eosinophils Absolute: 0 10*3/uL (ref 0.0–0.5)
Eosinophils Relative: 0 %
HCT: 31.4 % — ABNORMAL LOW (ref 39.0–52.0)
Hemoglobin: 10.6 g/dL — ABNORMAL LOW (ref 13.0–17.0)
Immature Granulocytes: 1 %
Lymphocytes Relative: 14 %
Lymphs Abs: 0.8 10*3/uL (ref 0.7–4.0)
MCH: 33.2 pg (ref 26.0–34.0)
MCHC: 33.8 g/dL (ref 30.0–36.0)
MCV: 98.4 fL (ref 80.0–100.0)
Monocytes Absolute: 0.9 10*3/uL (ref 0.1–1.0)
Monocytes Relative: 16 %
Neutro Abs: 4 10*3/uL (ref 1.7–7.7)
Neutrophils Relative %: 68 %
Platelet Count: 278 10*3/uL (ref 150–400)
RBC: 3.19 MIL/uL — ABNORMAL LOW (ref 4.22–5.81)
RDW: 16.5 % — ABNORMAL HIGH (ref 11.5–15.5)
WBC Count: 5.8 10*3/uL (ref 4.0–10.5)
nRBC: 0 % (ref 0.0–0.2)

## 2023-01-27 LAB — MAGNESIUM: Magnesium: 2.1 mg/dL (ref 1.7–2.4)

## 2023-01-27 MED FILL — Fosaprepitant Dimeglumine For IV Infusion 150 MG (Base Eq): INTRAVENOUS | Qty: 5 | Status: AC

## 2023-01-27 MED FILL — Dexamethasone Sodium Phosphate Inj 100 MG/10ML: INTRAMUSCULAR | Qty: 1 | Status: AC

## 2023-01-27 NOTE — Assessment & Plan Note (Signed)
LV thrombus and Atrial Flutter,ejection fraction 35 to 40%,  Continue Eliquis 5mg BID 

## 2023-01-27 NOTE — Progress Notes (Signed)
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    Spoke with patient via phone.  Patient reports that he continues to tolerate 12 oz of osmolite 1.5 4 times a day (6 cartons total).  Water flush of 1 cup, 4 times a day with each feeding.  Had loose bowel movement daily or every other day.  No nausea.    Reports that he received a shipment of enteral formula and supplies    Medications: jardiance  Labs: K 4.9, glucose 293  Anthropometrics:   Weight 170 lb 14.4 oz on 6/6 168 lb 12.8 oz on 5/31 168 lb 14.4 oz on 5/24 177 lb on 5/9 190 lb on 4.8 oz on 4/25   Estimated Energy Needs  Kcals: 2150-2580 Protein: 108-129 g Fluid: 2150-2580 ml  NUTRITION DIAGNOSIS: Severe malnutrition continues   MALNUTRITION DIAGNOSIS: severe malnutrition   INTERVENTION:  Continue osmolite 1.5, 12 oz 4 times a day (6 cartons total/day).  Water flush of 1 cup at each feeding.      MONITORING, EVALUATION, GOAL: weight trends, tube feeding    NEXT VISIT: Friday, June 14 during infusion  Zonie Crutcher B. Freida Busman, RD, LDN Registered Dietitian [phone]

## 2023-01-27 NOTE — Assessment & Plan Note (Addendum)
stage IVA oropharyngeal squamous cell carcinoma, Patient is on concurrent chemotherapy with Radiation.  Labs are reviewed and discussed with patient. Proceed with cisplatin resume 40 mg/m2 - dose reduce due to performance status, recent cardiovascular comorbidity Refer to home health

## 2023-01-27 NOTE — Assessment & Plan Note (Signed)
oxycodone 5mg Q6h PRN pain.   

## 2023-01-27 NOTE — Assessment & Plan Note (Signed)
On Tube Feeding. Follow up with nutritionist. Weight is stable.

## 2023-01-27 NOTE — Progress Notes (Signed)
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  Labs are reviewed and discussed with patient. Proceed with cisplatin resume 40 mg/m2 - dose reduce due to performance status, recent cardiovascular comorbidity Refer to home health   Encounter for antineoplastic chemotherapy Chemotherapy plan as listed above  Neoplasm related pain  oxycodone 5mg  Q6h PRN pain.    Protein-calorie malnutrition, severe (HCC) On Tube Feeding. Follow up with nutritionist. Weight is stable.   LV (left ventricular) mural thrombus LV thrombus and Atrial Flutter,ejection fraction 35 to 40%,  Continue Eliquis 5mg  BID  S/P percutaneous endoscopic gastrostomy (PEG) tube placement [facility]) Home health referral for PEG tube care.    No orders of the defined types were placed in this encounter.  Follow-up 1 week lab MD cisplatin.  All questions were answered. The patient knows to call the clinic with any problems, questions or concerns.  [name], MD, PhD [facility] Hematology Oncology 01/27/2023   HISTORY OF PRESENTING ILLNESS:  Patient presented outpatient ultrasound of his left lower extremity showing positive for DVT. Denies any recent,, surgery, travel or issue.  Denies any family history of DVT.  Reports that he developed left lower extremity swelling for a week, associated with moderate severity throbbing aching discomfort feels like pulling a muscle.  Denies any shortness of breath. Patient was started on Xarelto starting kit and advised to follow-up with heme-onc.  INTERVAL  HISTORY Kenneth Gilmore is a 72 y.o. male presents to re-establish care for oropharyngeal squamous cell carcinoma  Oncology History  Oropharyngeal cancer (HCC)  09/28/2022 Imaging   CT soft tissue neck with contrast showed infiltrating mass in the right tongue with multiple ipsilateral nodal metastases. The primary mass measures at least 4 cm and infiltrates parapharyngeal fat, visualization of tumor extent is limited by artifact from dental amalgam.   10/28/2022 Initial Diagnosis   Squamous cell carcinoma of tongue (HCC) Patient has throat pain and was evaluated by ENT Dr.Bennett.  Physical examination showed cervical lymphadenopathy.  10/15/2022 ultrasound-guided biopsy of right cervical lymph node showed fibrous tissue, detach minute salivary gland tissue.  Granular debris's and a few dysplastic squamous epithelial cells, nondiagnostic.  10/22/2022 repeat ultrasound-guided biopsy of right cervical lymph node was positive for malignancy, metastatic squamous cell carcinoma, keratinizing, in a background of dense fibrosis and tumor necrosis.   10/28/2022 Cancer Staging   Staging form: Oral Cavity, AJCC 8th Edition - Clinical: Stage IVA (cT3, cN2a, cM0) - Signed by [name], MD on 11/02/2022   11/16/2022 Imaging   PET scan showed 1. Large infiltrating right oropharyngeal mass is markedly hypermetabolic and consistent with known squamous cell carcinoma. 2. Bilateral hypermetabolic metastatic cervical lymphadenopathy. 3. No evidence of metastatic disease involving the chest, abdomen/pelvis or bony structures.     12/09/2022 -  Chemotherapy   Patient is on Treatment Plan : HEAD/NECK Cisplatin (40) q7d     01/03/2023 - 01/11/2023 Hospital Admission   Kenneth Gilmore is a 72 year old male with non-insulin-dependent diabetes mellitus, hyperlipidemia, history of DVT on Xarelto, oropharyngeal squamous cell carcinoma stage IVa, who presents emergency department from oncology clinic for chief concerns of  syncope event at home while he was in the shower. High sensitive troponin is 2925.  Telemetry showed atrial flutter, he was initially given diltiazem gtt., and quickly converted to sinus. Patient also had a significant dysphagia from oropharyngeal cancer, G tube is placed on 5/17, TF started.  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  2 drug-eluting stents were placed.  Patient is placed on Plavix.     01/07/2023 Procedure   S/p gastrostomy tube.  placement.  5/22 G tube tip was broken and Luer-Lok tip was replaced in ER    INTERVAL HISTORY Kenneth Gilmore is a 72 y.o. male who has above history reviewed by me today presents for follow up visit for P16 + oropharyngeal squamous cell carcinoma Currently on concurrent chemotherapy with radiation.  Today he reports feeling better. + fatigue He does tube feeding 12oz 4 time per day, he tolerates well. Gained 2 pounds. Denies nausea vomiting, diarrhea, abdominal pain.   Review of Systems  Constitutional:  Positive for malaise/fatigue. Negative for chills, fever and weight loss.  HENT:  Negative for sore throat.   Eyes:  Negative for redness.  Respiratory:  Negative for cough, shortness of breath and wheezing.   Cardiovascular:  Negative for chest pain and palpitations.  Gastrointestinal:  Negative for abdominal pain, blood in stool, nausea and vomiting.  Genitourinary:  Negative for dysuria.  Musculoskeletal:  Negative for myalgias.  Skin:  Negative for rash.  Neurological:  Negative for dizziness, tingling and tremors.  Endo/Heme/Allergies:  Does not bruise/bleed easily.  Psychiatric/Behavioral:  Negative for hallucinations.     MEDICAL HISTORY:  Past Medical History:  Diagnosis Date   Diabetes mellitus without complication (HCC)    Hyperlipidemia     Hypertension    Oropharyngeal cancer (HCC)     SURGICAL HISTORY: Past Surgical History:  Procedure Laterality Date   arm surgery Left    CORONARY ANGIOPLASTY WITH STENT PLACEMENT     CORONARY STENT INTERVENTION N/A 01/10/2023   Procedure: CORONARY STENT INTERVENTION;  Surgeon: Iran Ouch, MD;  Location: [facility] INVASIVE CV LAB;  Service: Cardiovascular;  Laterality: N/A;   IR GASTROSTOMY TUBE MOD SED  01/07/2023   left side rotator cuff surgery     RIGHT/LEFT HEART CATH AND CORONARY ANGIOGRAPHY N/A 01/10/2023   Procedure: RIGHT/LEFT HEART CATH AND CORONARY ANGIOGRAPHY;  Surgeon: Iran Ouch, MD;  Location: [facility] INVASIVE CV LAB;  Service: Cardiovascular;  Laterality: N/A;   SHOULDER SURGERY      SOCIAL HISTORY: Social History   Socioeconomic History   Marital status: Single    Spouse name: Not on file   Number of children: Not on file   Years of education: Not on file   Highest education level: Not on file  Occupational History   Occupation: retired  Tobacco Use   Smoking status: Never   Smokeless tobacco: Current    Types: Chew  Substance and Sexual Activity   Alcohol use: No   Drug use: Never   Sexual activity: Not Currently  Other Topics Concern   Not on file  Social History Narrative   Not on file   Social Determinants of Health   Financial Resource Strain: Low Risk  (12/27/2022)   Overall Financial Resource Strain (CARDIA)    Difficulty of Paying Living Expenses: Not hard at all  Food Insecurity:  No Food Insecurity (01/06/2023)   Hunger Vital Sign    Worried About Running Out of Food in the Last Year: Never true    Ran Out of Food in the Last Year: Never true  Transportation Needs: No Transportation Needs (01/06/2023)   PRAPARE - Administrator, Civil Service (Medical): No    Lack of Transportation (Non-Medical): No  Physical Activity: Not on file  Stress: Stress Concern Present (12/27/2022)   [employer] of Occupational Health -  Occupational Stress Questionnaire    Feeling of Stress : To some extent  Social Connections: Moderately Integrated (12/27/2022)   Social Connection and Isolation Panel [NHANES]    Frequency of Communication with Friends and Family: More than three times a week    Frequency of Social Gatherings with Friends and Family: More than three times a week    Attends Religious Services: More than 4 times per year    Active Member of [employer] or Organizations: Yes    Attends Banker Meetings: More than 4 times per year    Marital Status: Never married  Intimate Partner Violence: Not At Risk (01/06/2023)   Humiliation, Afraid, Rape, and Kick questionnaire    Fear of Current or Ex-Partner: No    Emotionally Abused: No    Physically Abused: No    Sexually Abused: No    FAMILY HISTORY: Family History  Problem Relation Age of Onset   Cancer Father        lung    ALLERGIES:  has No Known Allergies.  MEDICATIONS:  Current Outpatient Medications  Medication Sig Dispense Refill   apixaban (ELIQUIS) 5 MG TABS tablet Place 1 tablet (5 mg total) into feeding tube 2 (two) times daily. 60 tablet 0   atorvastatin (LIPITOR) 80 MG tablet Place 1 tablet (80 mg total) into feeding tube daily. 30 tablet 0   clopidogrel (PLAVIX) 75 MG tablet Place 1 tablet (75 mg total) into feeding tube daily with breakfast. 30 tablet 0   empagliflozin (JARDIANCE) 10 MG TABS tablet Take 1 tablet (10 mg total) by mouth daily. 30 tablet 0   metoprolol tartrate (LOPRESSOR) 25 MG tablet Place 0.5 tablets (12.5 mg total) into feeding tube 2 (two) times daily. 60 tablet 0   Nutritional Supplements (FEEDING SUPPLEMENT, OSMOLITE 1.5 CAL,) LIQD Give 2 cartons at 8am, noon, 4pm feeding via feeding tube.  Give 1 carton at 8pm via feeding tube.  Flush with 60ml water before and after each feeding.  Flush with additional TID between feedings to better meet hydration needs. Send bolus supplies. 1659 mL 0   oxyCODONE (OXY  IR/ROXICODONE) 5 MG immediate release tablet Place 1 tablet (5 mg total) into feeding tube every 6 (six) hours as needed for severe pain. 30 tablet 0   Protein (FEEDING SUPPLEMENT, PROSOURCE TF20,) liquid Place 60 mLs into feeding tube 2 (two) times daily.     Water For Irrigation, Sterile (FREE WATER) SOLN Place 200 mLs into feeding tube 4 (four) times daily - after meals and at bedtime.     No current facility-administered medications for this visit.     PHYSICAL EXAMINATION: ECOG PERFORMANCE STATUS: 1 - Symptomatic but completely ambulatory Vitals:   01/27/23 0930  BP: 103/67  Pulse: 73  Resp: 18  Temp: (!) 96 F (35.6 C)  SpO2: 100%   Filed Weights   01/27/23 0930  Weight: 170 lb 14.4 oz (77.5 kg)    Physical Exam Constitutional:  General: He is not in acute distress. HENT:     Head: Normocephalic and atraumatic.     Mouth/Throat:     Comments: Restricted mouth opening/trismus Thrush Eyes:     General: No scleral icterus. Cardiovascular:     Rate and Rhythm: Normal rate.  Pulmonary:     Effort: Pulmonary effort is normal. No respiratory distress.     Breath sounds: Normal breath sounds. No wheezing.  Abdominal:     General: Bowel sounds are normal. There is no distension.     Palpations: Abdomen is soft.  Musculoskeletal:        General: No swelling. Normal range of motion.     Cervical back: Normal range of motion and neck supple.  Lymphadenopathy:     Cervical: Cervical adenopathy present.  Skin:    General: Skin is warm and dry.     Findings: No erythema or rash.  Neurological:     Mental Status: He is alert and oriented to person, place, and time. Mental status is at baseline.     Cranial Nerves: No cranial nerve deficit.     Coordination: Coordination normal.  Psychiatric:        Mood and Affect: Mood normal.      LABORATORY DATA:  I have reviewed the data as listed     Latest Ref Rng & Units 01/27/2023    9:19 AM 01/21/2023    8:23 AM  01/13/2023    8:52 AM  CBC  WBC 4.0 - 10.5 K/uL 5.8  5.3  2.7   Hemoglobin 13.0 - 17.0 g/dL 14.7  82.9  56.2   Hematocrit 39.0 - 52.0 % 31.4  34.2  34.3   Platelets 150 - 400 K/uL 278  499  259       Latest Ref Rng & Units 01/27/2023    9:19 AM 01/25/2023   12:12 PM 01/21/2023    8:23 AM  CMP  Glucose 70 - 99 mg/dL 130  865  784   BUN 8 - 23 mg/dL 21  30  26    Creatinine 0.61 - 1.24 mg/dL 6.96  2.95  2.84   Sodium 135 - 145 mmol/L 137  137  135   Potassium 3.5 - 5.1 mmol/L 4.9  5.1  4.4   Chloride 98 - 111 mmol/L 97  99  95   CO2 22 - 32 mmol/L 29  30  28    Calcium 8.9 - 10.3 mg/dL 8.9  8.9  9.1   Total Protein 6.5 - 8.1 g/dL 6.2  6.4  7.0   Total Bilirubin 0.3 - 1.2 mg/dL 0.4  0.5  0.5   Alkaline Phos 38 - 126 U/L 67  67  75   AST 15 - 41 U/L 25  27  31    ALT 0 - 44 U/L 31  34  30

## 2023-01-27 NOTE — Assessment & Plan Note (Signed)
Home health referral for PEG tube care.   

## 2023-01-27 NOTE — Assessment & Plan Note (Signed)
Chemotherapy plan as listed above 

## 2023-01-28 ENCOUNTER — Other Ambulatory Visit: Payer: Self-pay

## 2023-01-28 ENCOUNTER — Ambulatory Visit
Admission: RE | Admit: 2023-01-28 | Discharge: 2023-01-28 | Disposition: A | Discharge status: Home / Self Care | Payer: Medicare Other | Source: Ambulatory Visit | Attending: Radiation Oncology | Admitting: Radiation Oncology

## 2023-01-28 ENCOUNTER — Inpatient Hospital Stay: Payer: Medicare Other

## 2023-01-28 ENCOUNTER — Ambulatory Visit: Payer: Medicare Other

## 2023-01-28 VITALS — BP 99/75 | HR 72 | Temp 97.9°F | Resp 18

## 2023-01-28 DIAGNOSIS — Z510 Encounter for antineoplastic radiation therapy: Secondary | ICD-10-CM | POA: Diagnosis not present

## 2023-01-28 DIAGNOSIS — R1310 Dysphagia, unspecified: Secondary | ICD-10-CM | POA: Diagnosis not present

## 2023-01-28 DIAGNOSIS — Z7982 Long term (current) use of aspirin: Secondary | ICD-10-CM | POA: Diagnosis not present

## 2023-01-28 DIAGNOSIS — C109 Malignant neoplasm of oropharynx, unspecified: Secondary | ICD-10-CM

## 2023-01-28 DIAGNOSIS — F17220 Nicotine dependence, chewing tobacco, uncomplicated: Secondary | ICD-10-CM | POA: Diagnosis not present

## 2023-01-28 DIAGNOSIS — Z79899 Other long term (current) drug therapy: Secondary | ICD-10-CM | POA: Diagnosis not present

## 2023-01-28 DIAGNOSIS — G893 Neoplasm related pain (acute) (chronic): Secondary | ICD-10-CM | POA: Diagnosis not present

## 2023-01-28 DIAGNOSIS — Z86718 Personal history of other venous thrombosis and embolism: Secondary | ICD-10-CM | POA: Diagnosis not present

## 2023-01-28 DIAGNOSIS — Z5111 Encounter for antineoplastic chemotherapy: Secondary | ICD-10-CM | POA: Diagnosis not present

## 2023-01-28 DIAGNOSIS — C029 Malignant neoplasm of tongue, unspecified: Secondary | ICD-10-CM | POA: Diagnosis not present

## 2023-01-28 DIAGNOSIS — C770 Secondary and unspecified malignant neoplasm of lymph nodes of head, face and neck: Secondary | ICD-10-CM | POA: Diagnosis not present

## 2023-01-28 LAB — RAD ONC ARIA SESSION SUMMARY
Course Elapsed Days: 50
Plan Fractions Treated to Date: 30
Plan Prescribed Dose Per Fraction: 2 Gy
Plan Total Fractions Prescribed: 35
Plan Total Prescribed Dose: 70 Gy
Reference Point Dosage Given to Date: 60 Gy
Reference Point Session Dosage Given: 2 Gy
Session Number: 30

## 2023-01-28 MED ORDER — SODIUM CHLORIDE 0.9 % IV SOLN
Freq: Once | INTRAVENOUS | Status: AC
  Filled 2023-01-28: qty 250

## 2023-01-28 MED ORDER — SODIUM CHLORIDE 0.9 % IV SOLN
150.0000 mg | Freq: Once | INTRAVENOUS | Status: AC
  Administered 2023-01-28: 150 mg via INTRAVENOUS
  Filled 2023-01-28: qty 150

## 2023-01-28 MED ORDER — POTASSIUM CHLORIDE IN NACL 20-0.9 MEQ/L-% IV SOLN
Freq: Once | INTRAVENOUS | Status: AC
  Filled 2023-01-28: qty 1000

## 2023-01-28 MED ORDER — MAGNESIUM SULFATE 2 GM/50ML IV SOLN
2.0000 g | Freq: Once | INTRAVENOUS | Status: AC
  Administered 2023-01-28: 2 g via INTRAVENOUS
  Filled 2023-01-28: qty 50

## 2023-01-28 MED ORDER — SODIUM CHLORIDE 0.9 % IV SOLN
40.0000 mg/m2 | Freq: Once | INTRAVENOUS | Status: AC
  Administered 2023-01-28: 77 mg via INTRAVENOUS
  Filled 2023-01-28: qty 77

## 2023-01-28 MED ORDER — PALONOSETRON HCL INJECTION 0.25 MG/5ML
0.2500 mg | Freq: Once | INTRAVENOUS | Status: AC
  Administered 2023-01-28: 0.25 mg via INTRAVENOUS
  Filled 2023-01-28: qty 5

## 2023-01-28 MED ORDER — SODIUM CHLORIDE 0.9 % IV SOLN
10.0000 mg | Freq: Once | INTRAVENOUS | Status: AC
  Administered 2023-01-28: 10 mg via INTRAVENOUS
  Filled 2023-01-28: qty 10

## 2023-01-28 NOTE — Patient Instructions (Signed)
[facility]  Discharge Instructions: Thank you for choosing [facility] to provide your oncology and hematology care.  If you have a lab appointment with the Cancer Center, please go directly to the Cancer Center and check in at the registration area.  Wear comfortable clothing and clothing appropriate for easy access to any Portacath or PICC line.   We strive to give you quality time with your provider. You may need to reschedule your appointment if you arrive late (15 or more minutes).  Arriving late affects you and other patients whose appointments are after yours.  Also, if you miss three or more appointments without notifying the office, you may be dismissed from the clinic at the provider's discretion.      For prescription refill requests, have your pharmacy contact our office and allow 72 hours for refills to be completed.    Today you received the following chemotherapy and/or immunotherapy agents Cisplatin      To help prevent nausea and vomiting after your treatment, we encourage you to take your nausea medication as directed.  BELOW ARE SYMPTOMS THAT SHOULD BE REPORTED IMMEDIATELY: *FEVER GREATER THAN 100.4 F (38 C) OR HIGHER *CHILLS OR SWEATING *NAUSEA AND VOMITING THAT IS NOT CONTROLLED WITH YOUR NAUSEA MEDICATION *UNUSUAL SHORTNESS OF BREATH *UNUSUAL BRUISING OR BLEEDING *URINARY PROBLEMS (pain or burning when urinating, or frequent urination) *BOWEL PROBLEMS (unusual diarrhea, constipation, pain near the anus) TENDERNESS IN MOUTH AND THROAT WITH OR WITHOUT PRESENCE OF ULCERS (sore throat, sores in mouth, or a toothache) UNUSUAL RASH, SWELLING OR PAIN  UNUSUAL VAGINAL DISCHARGE OR ITCHING   Items with * indicate a potential emergency and should be followed up as soon as possible or go to the Emergency Department if any problems should occur.  Please show the CHEMOTHERAPY ALERT CARD or IMMUNOTHERAPY ALERT CARD at check-in to  the Emergency Department and triage nurse.  Should you have questions after your visit or need to cancel or reschedule your appointment, please contact [facility]  [phone] and follow the prompts.  Office hours are 8:00 a.m. to 4:30 p.m. Monday - Friday. Please note that voicemails left after 4:00 p.m. may not be returned until the following business day.  We are closed weekends and major holidays. You have access to a nurse at all times for urgent questions. Please call the main number to the clinic [phone] and follow the prompts.  For any non-urgent questions, you may also contact your provider using MyChart. We now offer e-Visits for anyone 18 and older to request care online for non-urgent symptoms. For details visit [email or web address].   Also download the MyChart app! Go to the app store, search "MyChart", open the app, select [facility], and log in with your MyChart username and password.    

## 2023-01-28 NOTE — Progress Notes (Signed)
Per Dr. Cathie Hoops, ok to run post hydration fluids concurrently with Cisplatin

## 2023-01-31 ENCOUNTER — Ambulatory Visit
Admission: RE | Admit: 2023-01-31 | Discharge: 2023-01-31 | Disposition: A | Discharge status: Home / Self Care | Payer: Medicare Other | Source: Ambulatory Visit | Attending: Radiation Oncology | Admitting: Radiation Oncology

## 2023-01-31 ENCOUNTER — Other Ambulatory Visit: Payer: Self-pay

## 2023-01-31 DIAGNOSIS — Z7982 Long term (current) use of aspirin: Secondary | ICD-10-CM | POA: Diagnosis not present

## 2023-01-31 DIAGNOSIS — C109 Malignant neoplasm of oropharynx, unspecified: Secondary | ICD-10-CM | POA: Diagnosis not present

## 2023-01-31 DIAGNOSIS — Z5111 Encounter for antineoplastic chemotherapy: Secondary | ICD-10-CM | POA: Diagnosis not present

## 2023-01-31 DIAGNOSIS — C770 Secondary and unspecified malignant neoplasm of lymph nodes of head, face and neck: Secondary | ICD-10-CM | POA: Diagnosis not present

## 2023-01-31 DIAGNOSIS — F17220 Nicotine dependence, chewing tobacco, uncomplicated: Secondary | ICD-10-CM | POA: Diagnosis not present

## 2023-01-31 DIAGNOSIS — Z79899 Other long term (current) drug therapy: Secondary | ICD-10-CM | POA: Diagnosis not present

## 2023-01-31 DIAGNOSIS — Z86718 Personal history of other venous thrombosis and embolism: Secondary | ICD-10-CM | POA: Diagnosis not present

## 2023-01-31 DIAGNOSIS — C029 Malignant neoplasm of tongue, unspecified: Secondary | ICD-10-CM | POA: Diagnosis not present

## 2023-01-31 DIAGNOSIS — Z510 Encounter for antineoplastic radiation therapy: Secondary | ICD-10-CM | POA: Diagnosis not present

## 2023-01-31 DIAGNOSIS — G893 Neoplasm related pain (acute) (chronic): Secondary | ICD-10-CM | POA: Diagnosis not present

## 2023-01-31 DIAGNOSIS — R1310 Dysphagia, unspecified: Secondary | ICD-10-CM | POA: Diagnosis not present

## 2023-01-31 LAB — RAD ONC ARIA SESSION SUMMARY
Course Elapsed Days: 53
Plan Fractions Treated to Date: 31
Plan Prescribed Dose Per Fraction: 2 Gy
Plan Total Fractions Prescribed: 35
Plan Total Prescribed Dose: 70 Gy
Reference Point Dosage Given to Date: 62 Gy
Reference Point Session Dosage Given: 2 Gy
Session Number: 31

## 2023-02-01 ENCOUNTER — Ambulatory Visit
Admission: RE | Admit: 2023-02-01 | Discharge: 2023-02-01 | Disposition: A | Discharge status: Home / Self Care | Payer: Medicare Other | Source: Ambulatory Visit | Attending: Radiation Oncology | Admitting: Radiation Oncology

## 2023-02-01 ENCOUNTER — Other Ambulatory Visit: Payer: Self-pay

## 2023-02-01 ENCOUNTER — Ambulatory Visit: Payer: Medicare Other

## 2023-02-01 DIAGNOSIS — R1310 Dysphagia, unspecified: Secondary | ICD-10-CM | POA: Diagnosis not present

## 2023-02-01 DIAGNOSIS — C029 Malignant neoplasm of tongue, unspecified: Secondary | ICD-10-CM | POA: Diagnosis not present

## 2023-02-01 DIAGNOSIS — G893 Neoplasm related pain (acute) (chronic): Secondary | ICD-10-CM | POA: Diagnosis not present

## 2023-02-01 DIAGNOSIS — I4892 Unspecified atrial flutter: Secondary | ICD-10-CM | POA: Diagnosis not present

## 2023-02-01 DIAGNOSIS — C770 Secondary and unspecified malignant neoplasm of lymph nodes of head, face and neck: Secondary | ICD-10-CM | POA: Diagnosis not present

## 2023-02-01 DIAGNOSIS — I2510 Atherosclerotic heart disease of native coronary artery without angina pectoris: Secondary | ICD-10-CM | POA: Diagnosis not present

## 2023-02-01 DIAGNOSIS — Z79899 Other long term (current) drug therapy: Secondary | ICD-10-CM | POA: Diagnosis not present

## 2023-02-01 DIAGNOSIS — Z48812 Encounter for surgical aftercare following surgery on the circulatory system: Secondary | ICD-10-CM | POA: Diagnosis not present

## 2023-02-01 DIAGNOSIS — Z510 Encounter for antineoplastic radiation therapy: Secondary | ICD-10-CM | POA: Diagnosis not present

## 2023-02-01 DIAGNOSIS — Z7982 Long term (current) use of aspirin: Secondary | ICD-10-CM | POA: Diagnosis not present

## 2023-02-01 DIAGNOSIS — I214 Non-ST elevation (NSTEMI) myocardial infarction: Secondary | ICD-10-CM | POA: Diagnosis not present

## 2023-02-01 DIAGNOSIS — F17220 Nicotine dependence, chewing tobacco, uncomplicated: Secondary | ICD-10-CM | POA: Diagnosis not present

## 2023-02-01 DIAGNOSIS — I4891 Unspecified atrial fibrillation: Secondary | ICD-10-CM | POA: Diagnosis not present

## 2023-02-01 DIAGNOSIS — C109 Malignant neoplasm of oropharynx, unspecified: Secondary | ICD-10-CM | POA: Diagnosis not present

## 2023-02-01 DIAGNOSIS — Z86718 Personal history of other venous thrombosis and embolism: Secondary | ICD-10-CM | POA: Diagnosis not present

## 2023-02-01 DIAGNOSIS — Z5111 Encounter for antineoplastic chemotherapy: Secondary | ICD-10-CM | POA: Diagnosis not present

## 2023-02-01 LAB — RAD ONC ARIA SESSION SUMMARY
Course Elapsed Days: 54
Plan Fractions Treated to Date: 32
Plan Prescribed Dose Per Fraction: 2 Gy
Plan Total Fractions Prescribed: 35
Plan Total Prescribed Dose: 70 Gy
Reference Point Dosage Given to Date: 64 Gy
Reference Point Session Dosage Given: 2 Gy
Session Number: 32

## 2023-02-02 ENCOUNTER — Ambulatory Visit: Payer: Medicare Other | Admitting: Speech Pathology

## 2023-02-02 ENCOUNTER — Other Ambulatory Visit: Payer: Self-pay

## 2023-02-02 ENCOUNTER — Ambulatory Visit
Admission: RE | Admit: 2023-02-02 | Discharge: 2023-02-02 | Disposition: A | Discharge status: Home / Self Care | Payer: Medicare Other | Source: Ambulatory Visit | Attending: Radiation Oncology | Admitting: Radiation Oncology

## 2023-02-02 ENCOUNTER — Ambulatory Visit: Payer: Medicare Other

## 2023-02-02 DIAGNOSIS — Z86718 Personal history of other venous thrombosis and embolism: Secondary | ICD-10-CM | POA: Diagnosis not present

## 2023-02-02 DIAGNOSIS — Z5111 Encounter for antineoplastic chemotherapy: Secondary | ICD-10-CM | POA: Diagnosis not present

## 2023-02-02 DIAGNOSIS — Z510 Encounter for antineoplastic radiation therapy: Secondary | ICD-10-CM | POA: Diagnosis not present

## 2023-02-02 DIAGNOSIS — Z79899 Other long term (current) drug therapy: Secondary | ICD-10-CM | POA: Diagnosis not present

## 2023-02-02 DIAGNOSIS — C770 Secondary and unspecified malignant neoplasm of lymph nodes of head, face and neck: Secondary | ICD-10-CM | POA: Diagnosis not present

## 2023-02-02 DIAGNOSIS — R1310 Dysphagia, unspecified: Secondary | ICD-10-CM | POA: Diagnosis not present

## 2023-02-02 DIAGNOSIS — F17220 Nicotine dependence, chewing tobacco, uncomplicated: Secondary | ICD-10-CM | POA: Diagnosis not present

## 2023-02-02 DIAGNOSIS — Z7982 Long term (current) use of aspirin: Secondary | ICD-10-CM | POA: Diagnosis not present

## 2023-02-02 DIAGNOSIS — C029 Malignant neoplasm of tongue, unspecified: Secondary | ICD-10-CM | POA: Diagnosis not present

## 2023-02-02 DIAGNOSIS — C109 Malignant neoplasm of oropharynx, unspecified: Secondary | ICD-10-CM | POA: Diagnosis not present

## 2023-02-02 DIAGNOSIS — G893 Neoplasm related pain (acute) (chronic): Secondary | ICD-10-CM | POA: Diagnosis not present

## 2023-02-02 LAB — RAD ONC ARIA SESSION SUMMARY
Course Elapsed Days: 55
Plan Fractions Treated to Date: 33
Plan Prescribed Dose Per Fraction: 2 Gy
Plan Total Fractions Prescribed: 35
Plan Total Prescribed Dose: 70 Gy
Reference Point Dosage Given to Date: 66 Gy
Reference Point Session Dosage Given: 2 Gy
Session Number: 33

## 2023-02-03 ENCOUNTER — Encounter: Payer: Self-pay | Admitting: Oncology

## 2023-02-03 ENCOUNTER — Ambulatory Visit
Admission: RE | Admit: 2023-02-03 | Discharge: 2023-02-03 | Disposition: A | Discharge status: Home / Self Care | Payer: Medicare Other | Source: Ambulatory Visit | Attending: Radiation Oncology | Admitting: Radiation Oncology

## 2023-02-03 ENCOUNTER — Other Ambulatory Visit: Payer: Self-pay

## 2023-02-03 ENCOUNTER — Ambulatory Visit: Payer: Medicare Other

## 2023-02-03 ENCOUNTER — Inpatient Hospital Stay: Payer: Medicare Other

## 2023-02-03 ENCOUNTER — Inpatient Hospital Stay (HOSPITAL_BASED_OUTPATIENT_CLINIC_OR_DEPARTMENT_OTHER): Payer: Medicare Other | Admitting: Oncology

## 2023-02-03 VITALS — BP 97/65 | HR 72 | Temp 96.4°F | Resp 18 | Wt 170.5 lb

## 2023-02-03 DIAGNOSIS — Z5111 Encounter for antineoplastic chemotherapy: Secondary | ICD-10-CM | POA: Diagnosis not present

## 2023-02-03 DIAGNOSIS — R1310 Dysphagia, unspecified: Secondary | ICD-10-CM | POA: Diagnosis not present

## 2023-02-03 DIAGNOSIS — F17220 Nicotine dependence, chewing tobacco, uncomplicated: Secondary | ICD-10-CM | POA: Diagnosis not present

## 2023-02-03 DIAGNOSIS — Z931 Gastrostomy status: Secondary | ICD-10-CM | POA: Diagnosis not present

## 2023-02-03 DIAGNOSIS — Z510 Encounter for antineoplastic radiation therapy: Secondary | ICD-10-CM | POA: Diagnosis not present

## 2023-02-03 DIAGNOSIS — G893 Neoplasm related pain (acute) (chronic): Secondary | ICD-10-CM

## 2023-02-03 DIAGNOSIS — C109 Malignant neoplasm of oropharynx, unspecified: Secondary | ICD-10-CM | POA: Diagnosis not present

## 2023-02-03 DIAGNOSIS — Z7982 Long term (current) use of aspirin: Secondary | ICD-10-CM | POA: Diagnosis not present

## 2023-02-03 DIAGNOSIS — Z86718 Personal history of other venous thrombosis and embolism: Secondary | ICD-10-CM | POA: Diagnosis not present

## 2023-02-03 DIAGNOSIS — C029 Malignant neoplasm of tongue, unspecified: Secondary | ICD-10-CM | POA: Diagnosis not present

## 2023-02-03 DIAGNOSIS — E43 Unspecified severe protein-calorie malnutrition: Secondary | ICD-10-CM | POA: Diagnosis not present

## 2023-02-03 DIAGNOSIS — Z79899 Other long term (current) drug therapy: Secondary | ICD-10-CM | POA: Diagnosis not present

## 2023-02-03 DIAGNOSIS — C770 Secondary and unspecified malignant neoplasm of lymph nodes of head, face and neck: Secondary | ICD-10-CM | POA: Diagnosis not present

## 2023-02-03 LAB — CBC WITH DIFFERENTIAL (CANCER CENTER ONLY)
Abs Immature Granulocytes: 0.02 10*3/uL (ref 0.00–0.07)
Basophils Absolute: 0 10*3/uL (ref 0.0–0.1)
Basophils Relative: 1 %
Eosinophils Absolute: 0 10*3/uL (ref 0.0–0.5)
Eosinophils Relative: 1 %
HCT: 29.5 % — ABNORMAL LOW (ref 39.0–52.0)
Hemoglobin: 10.1 g/dL — ABNORMAL LOW (ref 13.0–17.0)
Immature Granulocytes: 0 %
Lymphocytes Relative: 11 %
Lymphs Abs: 0.5 10*3/uL — ABNORMAL LOW (ref 0.7–4.0)
MCH: 33.7 pg (ref 26.0–34.0)
MCHC: 34.2 g/dL (ref 30.0–36.0)
MCV: 98.3 fL (ref 80.0–100.0)
Monocytes Absolute: 0.6 10*3/uL (ref 0.1–1.0)
Monocytes Relative: 12 %
Neutro Abs: 3.7 10*3/uL (ref 1.7–7.7)
Neutrophils Relative %: 75 %
Platelet Count: 151 10*3/uL (ref 150–400)
RBC: 3 MIL/uL — ABNORMAL LOW (ref 4.22–5.81)
RDW: 17.6 % — ABNORMAL HIGH (ref 11.5–15.5)
WBC Count: 4.9 10*3/uL (ref 4.0–10.5)
nRBC: 0 % (ref 0.0–0.2)

## 2023-02-03 LAB — CMP (CANCER CENTER ONLY)
ALT: 26 U/L (ref 0–44)
AST: 26 U/L (ref 15–41)
Albumin: 3.5 g/dL (ref 3.5–5.0)
Alkaline Phosphatase: 75 U/L (ref 38–126)
Anion gap: 8 (ref 5–15)
BUN: 25 mg/dL — ABNORMAL HIGH (ref 8–23)
CO2: 30 mmol/L (ref 22–32)
Calcium: 9.1 mg/dL (ref 8.9–10.3)
Chloride: 99 mmol/L (ref 98–111)
Creatinine: 0.71 mg/dL (ref 0.61–1.24)
GFR, Estimated: >60 mL/min (ref >60)
Glucose, Bld: 280 mg/dL — ABNORMAL HIGH (ref 70–99)
Potassium: 4.4 mmol/L (ref 3.5–5.1)
Sodium: 137 mmol/L (ref 135–145)
Total Bilirubin: 0.5 mg/dL (ref 0.3–1.2)
Total Protein: 6.4 g/dL — ABNORMAL LOW (ref 6.5–8.1)

## 2023-02-03 LAB — RAD ONC ARIA SESSION SUMMARY
Course Elapsed Days: 56
Plan Fractions Treated to Date: 34
Plan Prescribed Dose Per Fraction: 2 Gy
Plan Total Fractions Prescribed: 35
Plan Total Prescribed Dose: 70 Gy
Reference Point Dosage Given to Date: 68 Gy
Reference Point Session Dosage Given: 2 Gy
Session Number: 34

## 2023-02-03 LAB — MAGNESIUM: Magnesium: 2 mg/dL (ref 1.7–2.4)

## 2023-02-03 MED FILL — Fosaprepitant Dimeglumine For IV Infusion 150 MG (Base Eq): INTRAVENOUS | Qty: 5 | Status: AC

## 2023-02-03 MED FILL — Dexamethasone Sodium Phosphate Inj 100 MG/10ML: INTRAMUSCULAR | Qty: 1 | Status: AC

## 2023-02-03 NOTE — Assessment & Plan Note (Signed)
On Tube Feeding. Follow up with nutritionist. Weight is stable.  

## 2023-02-03 NOTE — Progress Notes (Signed)
Pt here for follow up. No new concerns voiced. Pt states he is tolerating tube feedings well.

## 2023-02-03 NOTE — Assessment & Plan Note (Signed)
Chemotherapy plan as listed above 

## 2023-02-03 NOTE — Assessment & Plan Note (Addendum)
stage IVA oropharyngeal squamous cell carcinoma, Patient is on concurrent chemotherapy with Radiation.  Labs are reviewed and discussed with patient. Proceed with cisplatin 40 mg/m2 He is finishing chemotherapy and radiation.  Plan PET restaging in 2 months.

## 2023-02-03 NOTE — Assessment & Plan Note (Signed)
Home health for PEG tube care.

## 2023-02-03 NOTE — Progress Notes (Signed)
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  Labs are reviewed and discussed with patient. Proceed with cisplatin 40 mg/m2 He is finishing chemotherapy and radiation.  Plan PET restaging in 2 months.   Encounter for antineoplastic chemotherapy Chemotherapy plan as listed above  Neoplasm related pain  oxycodone 5mg  Q6h PRN pain.    Protein-calorie malnutrition, severe (HCC) On Tube Feeding. Follow up with nutritionist. Weight is stable.   S/P percutaneous endoscopic gastrostomy (PEG) tube placement [facility]) Home health for PEG tube care.     Orders Placed This Encounter  Procedures   NM PET Image Restag (PS) Skull Base To Thigh    Standing Status:   Future    Standing Expiration Date:   08/05/2023    Order Specific Question:   If indicated for the ordered procedure, I authorize the administration of a radiopharmaceutical per Radiology protocol    Answer:   Yes    Order Specific Question:   Preferred imaging location?    Answer:   [facility]   CBC with Differential (Cancer Center Only)    Standing Status:   Future    Standing Expiration Date:   02/03/2024   CMP (Cancer Center only)    Standing Status:   Future    Standing Expiration Date:   02/03/2024   Magnesium    Standing Status:   Future    Standing Expiration Date:   02/03/2024   CBC with Differential (Cancer Center Only)    Standing Status:   Future    Standing Expiration Date:   02/03/2024   CMP (Cancer Center only)    Standing Status:   Future    Standing Expiration Date:    02/03/2024   Magnesium    Standing Status:   Future    Standing Expiration Date:   02/03/2024    Follow-up 2 weeks. .  All questions were answered. The patient knows to call the clinic with any problems, questions or concerns.  [name], MD, PhD [facility] Hematology Oncology 02/03/2023   HISTORY OF PRESENTING ILLNESS:  Patient presented outpatient ultrasound of his left lower extremity showing positive for DVT. Denies any recent,, surgery, travel or issue.  Denies any family history of DVT.  Reports that he developed left lower extremity swelling for a week, associated with moderate severity throbbing aching discomfort feels like pulling a muscle.  Denies any shortness of breath. Patient was started on Xarelto starting kit and advised to follow-up with heme-onc.  INTERVAL HISTORY Kenneth Gilmore is a 72 y.o. male presents to re-establish care for oropharyngeal squamous cell carcinoma  Oncology History  Oropharyngeal cancer (HCC)  09/28/2022 Imaging   CT soft tissue neck with contrast showed infiltrating mass in the right tongue with multiple ipsilateral nodal metastases. The primary mass measures at least 4 cm and infiltrates parapharyngeal fat, visualization of tumor extent is limited by artifact from dental amalgam.   10/28/2022 Initial Diagnosis   Squamous cell carcinoma of tongue (HCC) Patient has throat pain and was evaluated by ENT Dr.Bennett.  Physical examination showed cervical lymphadenopathy.  10/15/2022 ultrasound-guided biopsy of right cervical  lymph node showed fibrous tissue, detach minute salivary gland tissue.  Granular debris's and a few dysplastic squamous epithelial cells, nondiagnostic.  10/22/2022 repeat ultrasound-guided biopsy of right cervical lymph node was positive for malignancy, metastatic squamous cell carcinoma, keratinizing, in a background of dense fibrosis and tumor necrosis.   10/28/2022 Cancer Staging   Staging form: Oral Cavity, AJCC 8th Edition -  Clinical: Stage IVA (cT3, cN2a, cM0) - Signed by [name], MD on 11/02/2022   11/16/2022 Imaging   PET scan showed 1. Large infiltrating right oropharyngeal mass is markedly hypermetabolic and consistent with known squamous cell carcinoma. 2. Bilateral hypermetabolic metastatic cervical lymphadenopathy. 3. No evidence of metastatic disease involving the chest, abdomen/pelvis or bony structures.     12/09/2022 -  Chemotherapy   Patient is on Treatment Plan : HEAD/NECK Cisplatin (40) q7d     01/03/2023 - 01/11/2023 Hospital Admission   Kenneth Gilmore is a 72 year old male with non-insulin-dependent diabetes mellitus, hyperlipidemia, history of DVT on Xarelto, oropharyngeal squamous cell carcinoma stage IVa, who presents emergency department from oncology clinic for chief concerns of syncope event at home while he was in the shower. High sensitive troponin is 2925.  Telemetry showed atrial flutter, he was initially given diltiazem gtt., and quickly converted to sinus. Patient also had a significant dysphagia from oropharyngeal cancer, G tube is placed on 5/17, TF started.  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  2 drug-eluting stents were placed.  Patient is placed on Plavix.     01/07/2023 Procedure   S/p gastrostomy tube.  placement.  5/22 G tube tip was broken and Luer-Lok tip was replaced in ER    INTERVAL HISTORY Kenneth Gilmore is a 72 y.o. male who has above history reviewed by me today presents for follow up visit for P16 + oropharyngeal squamous cell carcinoma Currently on concurrent chemotherapy with radiation.  Today he reports feeling better. + fatigue He does tube feeding 12oz 4 time per day, he tolerates well. Stable weight.  Denies nausea vomiting, diarrhea, abdominal pain.   Review of Systems   Constitutional:  Positive for malaise/fatigue. Negative for chills, fever and weight loss.  HENT:  Negative for sore throat.   Eyes:  Negative for redness.  Respiratory:  Negative for cough, shortness of breath and wheezing.   Cardiovascular:  Negative for chest pain and palpitations.  Gastrointestinal:  Negative for abdominal pain, blood in stool, nausea and vomiting.  Genitourinary:  Negative for dysuria.  Musculoskeletal:  Negative for myalgias.  Skin:  Negative for rash.  Neurological:  Negative for dizziness, tingling and tremors.  Endo/Heme/Allergies:  Does not bruise/bleed easily.  Psychiatric/Behavioral:  Negative for hallucinations.     MEDICAL HISTORY:  Past Medical History:  Diagnosis Date   Diabetes mellitus without complication (HCC)    Hyperlipidemia    Hypertension    Oropharyngeal cancer (HCC)     SURGICAL HISTORY: Past Surgical History:  Procedure Laterality Date   arm surgery Left    CORONARY ANGIOPLASTY WITH STENT PLACEMENT     CORONARY STENT INTERVENTION N/A 01/10/2023   Procedure: CORONARY STENT INTERVENTION;  Surgeon: Iran Ouch, MD;  Location: [facility] INVASIVE CV LAB;  Service: Cardiovascular;  Laterality: N/A;   IR GASTROSTOMY TUBE MOD SED  01/07/2023   left side rotator cuff surgery     RIGHT/LEFT HEART  CATH AND CORONARY ANGIOGRAPHY N/A 01/10/2023   Procedure: RIGHT/LEFT HEART CATH AND CORONARY ANGIOGRAPHY;  Surgeon: Iran Ouch, MD;  Location: [facility] INVASIVE CV LAB;  Service: Cardiovascular;  Laterality: N/A;   SHOULDER SURGERY      SOCIAL HISTORY: Social History   Socioeconomic History   Marital status: Single    Spouse name: Not on file   Number of children: Not on file   Years of education: Not on file   Highest education level: Not on file  Occupational History   Occupation: retired  Tobacco Use   Smoking status: Never   Smokeless tobacco: Current    Types: Chew  Substance and Sexual Activity   Alcohol use: No   Drug use:  Never   Sexual activity: Not Currently  Other Topics Concern   Not on file  Social History Narrative   Not on file   Social Determinants of Health   Financial Resource Strain: Low Risk  (12/27/2022)   Overall Financial Resource Strain (CARDIA)    Difficulty of Paying Living Expenses: Not hard at all  Food Insecurity: No Food Insecurity (01/06/2023)   Hunger Vital Sign    Worried About Running Out of Food in the Last Year: Never true    Ran Out of Food in the Last Year: Never true  Transportation Needs: No Transportation Needs (01/06/2023)   PRAPARE - Administrator, Civil Service (Medical): No    Lack of Transportation (Non-Medical): No  Physical Activity: Not on file  Stress: Stress Concern Present (12/27/2022)   [employer] of Occupational Health - Occupational Stress Questionnaire    Feeling of Stress : To some extent  Social Connections: Moderately Integrated (12/27/2022)   Social Connection and Isolation Panel [NHANES]    Frequency of Communication with Friends and Family: More than three times a week    Frequency of Social Gatherings with Friends and Family: More than three times a week    Attends Religious Services: More than 4 times per year    Active Member of [employer] or Organizations: Yes    Attends Banker Meetings: More than 4 times per year    Marital Status: Never married  Intimate Partner Violence: Not At Risk (01/06/2023)   Humiliation, Afraid, Rape, and Kick questionnaire    Fear of Current or Ex-Partner: No    Emotionally Abused: No    Physically Abused: No    Sexually Abused: No    FAMILY HISTORY: Family History  Problem Relation Age of Onset   Cancer Father        lung    ALLERGIES:  has No Known Allergies.  MEDICATIONS:  Current Outpatient Medications  Medication Sig Dispense Refill   apixaban (ELIQUIS) 5 MG TABS tablet Place 1 tablet (5 mg total) into feeding tube 2 (two) times daily. 60 tablet 0   atorvastatin (LIPITOR) 80  MG tablet Place 1 tablet (80 mg total) into feeding tube daily. 30 tablet 0   clopidogrel (PLAVIX) 75 MG tablet Place 1 tablet (75 mg total) into feeding tube daily with breakfast. 30 tablet 0   empagliflozin (JARDIANCE) 10 MG TABS tablet Take 1 tablet (10 mg total) by mouth daily. 30 tablet 0   metoprolol tartrate (LOPRESSOR) 25 MG tablet Place 0.5 tablets (12.5 mg total) into feeding tube 2 (two) times daily. 60 tablet 0   Nutritional Supplements (FEEDING SUPPLEMENT, OSMOLITE 1.5 CAL,) LIQD Give 2 cartons at 8am, noon, 4pm feeding via feeding tube.  Give 1  carton at 8pm via feeding tube.  Flush with 60ml water before and after each feeding.  Flush with additional TID between feedings to better meet hydration needs. Send bolus supplies. 1659 mL 0   Protein (FEEDING SUPPLEMENT, PROSOURCE TF20,) liquid Place 60 mLs into feeding tube 2 (two) times daily.     Water For Irrigation, Sterile (FREE WATER) SOLN Place 200 mLs into feeding tube 4 (four) times daily - after meals and at bedtime.     oxyCODONE (OXY IR/ROXICODONE) 5 MG immediate release tablet Place 1 tablet (5 mg total) into feeding tube every 6 (six) hours as needed for severe pain. (Patient not taking: Reported on 02/03/2023) 30 tablet 0   No current facility-administered medications for this visit.     PHYSICAL EXAMINATION: ECOG PERFORMANCE STATUS: 1 - Symptomatic but completely ambulatory Vitals:   02/03/23 0915  BP: 97/65  Pulse: 72  Resp: 18  Temp: (!) 96.4 F (35.8 C)   Filed Weights   02/03/23 0915  Weight: 170 lb 8 oz (77.3 kg)    Physical Exam Constitutional:      General: He is not in acute distress. HENT:     Head: Normocephalic and atraumatic.     Mouth/Throat:     Comments: Restricted mouth opening/trismus Thrush Eyes:     General: No scleral icterus. Cardiovascular:     Rate and Rhythm: Normal rate.  Pulmonary:     Effort: Pulmonary effort is normal. No respiratory distress.     Breath sounds: Normal  breath sounds. No wheezing.  Abdominal:     General: Bowel sounds are normal. There is no distension.     Palpations: Abdomen is soft.  Musculoskeletal:        General: No swelling. Normal range of motion.     Cervical back: Normal range of motion and neck supple.  Lymphadenopathy:     Cervical: Cervical adenopathy present.  Skin:    General: Skin is warm and dry.     Findings: No erythema or rash.  Neurological:     Mental Status: He is alert and oriented to person, place, and time. Mental status is at baseline.     Cranial Nerves: No cranial nerve deficit.     Coordination: Coordination normal.  Psychiatric:        Mood and Affect: Mood normal.      LABORATORY DATA:  I have reviewed the data as listed     Latest Ref Rng & Units 02/03/2023    8:50 AM 01/27/2023    9:19 AM 01/21/2023    8:23 AM  CBC  WBC 4.0 - 10.5 K/uL 4.9  5.8  5.3   Hemoglobin 13.0 - 17.0 g/dL 16.1  09.6  04.5   Hematocrit 39.0 - 52.0 % 29.5  31.4  34.2   Platelets 150 - 400 K/uL 151  278  499       Latest Ref Rng & Units 02/03/2023    8:51 AM 01/27/2023    9:19 AM 01/25/2023   12:12 PM  CMP  Glucose 70 - 99 mg/dL 409  811  914   BUN 8 - 23 mg/dL 25  21  30    Creatinine 0.61 - 1.24 mg/dL 7.82  9.56  2.13   Sodium 135 - 145 mmol/L 137  137  137   Potassium 3.5 - 5.1 mmol/L 4.4  4.9  5.1   Chloride 98 - 111 mmol/L 99  97  99   CO2 22 - 32  mmol/L 30  29  30    Calcium 8.9 - 10.3 mg/dL 9.1  8.9  8.9   Total Protein 6.5 - 8.1 g/dL 6.4  6.2  6.4   Total Bilirubin 0.3 - 1.2 mg/dL 0.5  0.4  0.5   Alkaline Phos 38 - 126 U/L 75  67  67   AST 15 - 41 U/L 26  25  27    ALT 0 - 44 U/L 26  31  34

## 2023-02-03 NOTE — Assessment & Plan Note (Signed)
oxycodone 5mg Q6h PRN pain.   

## 2023-02-04 ENCOUNTER — Inpatient Hospital Stay: Payer: Medicare Other

## 2023-02-04 ENCOUNTER — Other Ambulatory Visit: Payer: Self-pay

## 2023-02-04 ENCOUNTER — Telehealth: Payer: Self-pay

## 2023-02-04 ENCOUNTER — Ambulatory Visit
Admission: RE | Admit: 2023-02-04 | Discharge: 2023-02-04 | Disposition: A | Discharge status: Home / Self Care | Payer: Medicare Other | Source: Ambulatory Visit | Attending: Radiation Oncology | Admitting: Radiation Oncology

## 2023-02-04 ENCOUNTER — Ambulatory Visit: Payer: Medicare Other

## 2023-02-04 VITALS — BP 107/66 | HR 66 | Temp 97.0°F | Resp 18

## 2023-02-04 DIAGNOSIS — R1310 Dysphagia, unspecified: Secondary | ICD-10-CM | POA: Diagnosis not present

## 2023-02-04 DIAGNOSIS — Z5111 Encounter for antineoplastic chemotherapy: Secondary | ICD-10-CM | POA: Diagnosis not present

## 2023-02-04 DIAGNOSIS — Z510 Encounter for antineoplastic radiation therapy: Secondary | ICD-10-CM | POA: Diagnosis not present

## 2023-02-04 DIAGNOSIS — G893 Neoplasm related pain (acute) (chronic): Secondary | ICD-10-CM | POA: Diagnosis not present

## 2023-02-04 DIAGNOSIS — Z7982 Long term (current) use of aspirin: Secondary | ICD-10-CM | POA: Diagnosis not present

## 2023-02-04 DIAGNOSIS — C029 Malignant neoplasm of tongue, unspecified: Secondary | ICD-10-CM | POA: Diagnosis not present

## 2023-02-04 DIAGNOSIS — Z79899 Other long term (current) drug therapy: Secondary | ICD-10-CM | POA: Diagnosis not present

## 2023-02-04 DIAGNOSIS — C770 Secondary and unspecified malignant neoplasm of lymph nodes of head, face and neck: Secondary | ICD-10-CM | POA: Diagnosis not present

## 2023-02-04 DIAGNOSIS — Z86718 Personal history of other venous thrombosis and embolism: Secondary | ICD-10-CM | POA: Diagnosis not present

## 2023-02-04 DIAGNOSIS — F17220 Nicotine dependence, chewing tobacco, uncomplicated: Secondary | ICD-10-CM | POA: Diagnosis not present

## 2023-02-04 DIAGNOSIS — C109 Malignant neoplasm of oropharynx, unspecified: Secondary | ICD-10-CM

## 2023-02-04 LAB — RAD ONC ARIA SESSION SUMMARY
Course Elapsed Days: 57
Plan Fractions Treated to Date: 35
Plan Prescribed Dose Per Fraction: 2 Gy
Plan Total Fractions Prescribed: 35
Plan Total Prescribed Dose: 70 Gy
Reference Point Dosage Given to Date: 70 Gy
Reference Point Session Dosage Given: 2 Gy
Session Number: 35

## 2023-02-04 MED ORDER — SODIUM CHLORIDE 0.9 % IV SOLN
40.0000 mg/m2 | Freq: Once | INTRAVENOUS | Status: AC
  Administered 2023-02-04: 77 mg via INTRAVENOUS
  Filled 2023-02-04: qty 77

## 2023-02-04 MED ORDER — SODIUM CHLORIDE 0.9 % IV SOLN
150.0000 mg | Freq: Once | INTRAVENOUS | Status: AC
  Administered 2023-02-04: 150 mg via INTRAVENOUS
  Filled 2023-02-04: qty 150

## 2023-02-04 MED ORDER — SODIUM CHLORIDE 0.9 % IV SOLN
Freq: Once | INTRAVENOUS | Status: AC
  Filled 2023-02-04: qty 250

## 2023-02-04 MED ORDER — POTASSIUM CHLORIDE IN NACL 20-0.9 MEQ/L-% IV SOLN
Freq: Once | INTRAVENOUS | Status: AC
  Filled 2023-02-04: qty 1000

## 2023-02-04 MED ORDER — PALONOSETRON HCL INJECTION 0.25 MG/5ML
0.2500 mg | Freq: Once | INTRAVENOUS | Status: AC
  Administered 2023-02-04: 0.25 mg via INTRAVENOUS
  Filled 2023-02-04: qty 5

## 2023-02-04 MED ORDER — MAGNESIUM SULFATE 2 GM/50ML IV SOLN
2.0000 g | Freq: Once | INTRAVENOUS | Status: AC
  Administered 2023-02-04: 2 g via INTRAVENOUS
  Filled 2023-02-04: qty 50

## 2023-02-04 MED ORDER — SODIUM CHLORIDE 0.9 % IV SOLN
10.0000 mg | Freq: Once | INTRAVENOUS | Status: AC
  Administered 2023-02-04: 10 mg via INTRAVENOUS
  Filled 2023-02-04: qty 10

## 2023-02-04 NOTE — Progress Notes (Signed)
Nutrition Follow-up:   Patient with stage IV oropharyngeal SCC undergoing concurrent chemotherapy and radiation.  Met with patient during infusion.  Unable to get a vein this am. Reports that he has been giving osmolite 1.5, 12 oz 4 times per day.  Flushing with at least 1 cup + little more with each feeding.  Has not had bowel movement in 2 days.  Planning take liquid stool softner when he gets home today.  No nausea.      Medications: reviewed  Labs: glucose 280, BUN 25, creatinine 0.71  Anthropometrics:   Weight 170 lb 8 oz today 168 lb 12.8 oz on 5/31 168 lb 14.4 oz on 5/24 177 lb on 5/9 190 lb 4.8 oz on 4/25   Estimated Energy Needs  Kcals: 2150-2580 Protein: 108-129 g Fluid: 2150-2580 ml  NUTRITION DIAGNOSIS: Severe malnutrition improving   MALNUTRITION DIAGNOSIS: Severe malnutrition improving   INTERVENTION:  Continue 12 oz of osmolite 1.5 4 times per day via feeding tube.  Increase flush to 5 cups daily.  Pt agreeable to adding extra water flush separate from feeding.  Reminded patient to call [employer] when gets down to 1 week left of formula Patient has RD contact information    MONITORING, EVALUATION, GOAL: weight trends, tube feeding   NEXT VISIT: phone call, Friday, June 28   Jeorge Reister B. Freida Busman, RD, LDN Registered Dietitian [phone]

## 2023-02-04 NOTE — Progress Notes (Signed)
Per Dr. Cathie Hoops okay to run Cisplatin and post Cisplatin fluids together.

## 2023-02-04 NOTE — Patient Instructions (Signed)
[facility]  Discharge Instructions: Thank you for choosing [facility] to provide your oncology and hematology care.  If you have a lab appointment with the Cancer Center, please go directly to the Cancer Center and check in at the registration area.  Wear comfortable clothing and clothing appropriate for easy access to any Portacath or PICC line.   We strive to give you quality time with your provider. You may need to reschedule your appointment if you arrive late (15 or more minutes).  Arriving late affects you and other patients whose appointments are after yours.  Also, if you miss three or more appointments without notifying the office, you may be dismissed from the clinic at the provider's discretion.      For prescription refill requests, have your pharmacy contact our office and allow 72 hours for refills to be completed.    Today you received the following chemotherapy and/or immunotherapy agents Cisplatin      To help prevent nausea and vomiting after your treatment, we encourage you to take your nausea medication as directed.  BELOW ARE SYMPTOMS THAT SHOULD BE REPORTED IMMEDIATELY: *FEVER GREATER THAN 100.4 F (38 C) OR HIGHER *CHILLS OR SWEATING *NAUSEA AND VOMITING THAT IS NOT CONTROLLED WITH YOUR NAUSEA MEDICATION *UNUSUAL SHORTNESS OF BREATH *UNUSUAL BRUISING OR BLEEDING *URINARY PROBLEMS (pain or burning when urinating, or frequent urination) *BOWEL PROBLEMS (unusual diarrhea, constipation, pain near the anus) TENDERNESS IN MOUTH AND THROAT WITH OR WITHOUT PRESENCE OF ULCERS (sore throat, sores in mouth, or a toothache) UNUSUAL RASH, SWELLING OR PAIN  UNUSUAL VAGINAL DISCHARGE OR ITCHING   Items with * indicate a potential emergency and should be followed up as soon as possible or go to the Emergency Department if any problems should occur.  Please show the CHEMOTHERAPY ALERT CARD or IMMUNOTHERAPY ALERT CARD at check-in to  the Emergency Department and triage nurse.  Should you have questions after your visit or need to cancel or reschedule your appointment, please contact [facility]  [phone] and follow the prompts.  Office hours are 8:00 a.m. to 4:30 p.m. Monday - Friday. Please note that voicemails left after 4:00 p.m. may not be returned until the following business day.  We are closed weekends and major holidays. You have access to a nurse at all times for urgent questions. Please call the main number to the clinic [phone] and follow the prompts.  For any non-urgent questions, you may also contact your provider using MyChart. We now offer e-Visits for anyone 18 and older to request care online for non-urgent symptoms. For details visit [email or web address].   Also download the MyChart app! Go to the app store, search "MyChart", open the app, select [facility], and log in with your MyChart username and password.    

## 2023-02-04 NOTE — Telephone Encounter (Signed)
Contacted [facility] and spoke to [place] regarding tube feeding dressing change orders. Per Eileen Stanford, they discharged patient last week. She states that patient was taught how to do dressing changes and once the caregiver is educated on how to manage feeding/ dressing change, they are discharged.

## 2023-02-09 ENCOUNTER — Ambulatory Visit: Payer: Medicare Other | Admitting: Speech Pathology

## 2023-02-15 NOTE — Progress Notes (Unsigned)
Cardiology Office Note    Date:  02/17/2023   ID:  CHILD Kenneth Gilmore, DOB 19-Jul-1951, MRN [id number]  PCP:  [facility]  Cardiologist:  [name], MD  Electrophysiologist:  None   Chief Complaint: Hospital follow-up  History of Present Illness:   Kenneth Gilmore is a 72 y.o. 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  He was previously followed by Dr. Lady Gary, last seeing him in 04/2022.  He was admitted to [facility] in 5/13 through 01/11/2023 with syncopal episode while in the shower, found to have NSTEMI, acute HFrEF complicated by mural thrombus, and new onset atrial flutter with RVR.  Episode of syncope was felt to be secondary to significant tachycardia in the setting of borderline low blood pressure with inability to exclude an arrhythmic event.  He converted to sinus rhythm during the admission.  On telemetry, he was having supraventricular and ventricular ectopy as well as runs of NSVT following conversion to sinus rhythm.  High-sensitivity troponin peaked at 3672.  Echo showed an EF of 35 to 40% with a 3.3 x 1.7 mural thrombus near the left ventricular apex, akinesis of mid apical anterolateral, anterior wall, apical, and apical segments, grade 1 diastolic dysfunction, normal RV systolic function with mildly large ventricular cavity size and normal PASP, trivial mitral regurgitation, indeterminate number of aortic valve cusps with trivial insufficiency and sclerosis without evidence of stenosis,  estimated right atrial pressure 3 mmHg.  R/LHC on 01/10/2023 showed heavily calcified coronary arteries with severe two-vessel CAD.  Chronically occluded mid LAD stent with faint left to left collaterals.  Severe in-stent restenosis of the mid LCx as well as severe calcified stenosis proximally.  The RCA had moderate nonobstructive disease.  RHC showed low filling pressures, normal pulmonary pressure and normal cardiac output.  He underwent successful complex angioplasty and 2 overlapping drug-eluting stents to the LCx extending into the ostium.  The procedure was overall difficult due to diffuse calcified disease and difficulty delivering stents.  An extension guide catheter had to be used with predilatation to high-pressure with a score flex balloon.  It was recommended to treat the remaining CAD medically.  During the admission, gastrostomy tube was placed.  Lopressor has subsequently been decreased to 12.5 mg twice daily given blood pressure in the 90s over 50s at his oncology office.  He comes in today and is without symptoms of angina or cardiac decompensation.  No presyncope or syncope.  Dizziness has improved, however he has been taking Lopressor 25 mg once daily rather than 12.5 mg twice daily.  No lower extremity swelling, abdominal distention, or progressive orthopnea.  No falls, hematochezia, or melena.  Adherent to cardiac medications without target effects.   Labs independently reviewed: 01/2023 - potassium 4.6, BUN 25, serum creatinine 0.89, albumin 3.5, AST/ALT normal, Hgb 9.2, PLT 171, magnesium 2.0 12/2022 - TC 138, TG 84, HDL 29, LDL 92, A1c 7.5 02/2020 - TSH normal  Past Medical History:  Diagnosis Date   Diabetes mellitus without complication (HCC)    Hyperlipidemia    Hypertension  Oropharyngeal cancer (HCC)     Past Surgical History:  Procedure Laterality Date   arm surgery Left    CORONARY ANGIOPLASTY WITH STENT PLACEMENT     CORONARY STENT INTERVENTION N/A 01/10/2023    Procedure: CORONARY STENT INTERVENTION;  Surgeon: Iran Ouch, MD;  Location: [facility] INVASIVE CV LAB;  Service: Cardiovascular;  Laterality: N/A;   IR GASTROSTOMY TUBE MOD SED  01/07/2023   left side rotator cuff surgery     RIGHT/LEFT HEART CATH AND CORONARY ANGIOGRAPHY N/A 01/10/2023   Procedure: RIGHT/LEFT HEART CATH AND CORONARY ANGIOGRAPHY;  Surgeon: Iran Ouch, MD;  Location: [facility] INVASIVE CV LAB;  Service: Cardiovascular;  Laterality: N/A;   SHOULDER SURGERY      Current Medications: Current Meds  Medication Sig   amoxicillin-clavulanate (AUGMENTIN) 200-28.5 MG/5ML suspension Place 15 mLs (600 mg total) into feeding tube 2 (two) times daily.   apixaban (ELIQUIS) 5 MG TABS tablet Place 1 tablet (5 mg total) into feeding tube 2 (two) times daily.   atorvastatin (LIPITOR) 80 MG tablet Place 1 tablet (80 mg total) into feeding tube daily.   clopidogrel (PLAVIX) 75 MG tablet Place 1 tablet (75 mg total) into feeding tube daily with breakfast.   empagliflozin (JARDIANCE) 10 MG TABS tablet Take 1 tablet (10 mg total) by mouth daily.   metoprolol tartrate (LOPRESSOR) 25 MG tablet Place 0.5 tablets (12.5 mg total) into feeding tube 2 (two) times daily.   Nutritional Supplements (FEEDING SUPPLEMENT, OSMOLITE 1.5 CAL,) LIQD Give 2 cartons at 8am, noon, 4pm feeding via feeding tube.  Give 1 carton at 8pm via feeding tube.  Flush with 60ml water before and after each feeding.  Flush with additional TID between feedings to better meet hydration needs. Send bolus supplies.   Protein (FEEDING SUPPLEMENT, PROSOURCE TF20,) liquid Place 60 mLs into feeding tube 2 (two) times daily.   Water For Irrigation, Sterile (FREE WATER) SOLN Place 200 mLs into feeding tube 4 (four) times daily - after meals and at bedtime.    Allergies:   Patient has no known allergies.   Social History   Socioeconomic History   Marital status: Single    Spouse name: Not on file   Number of children: Not on file    Years of education: Not on file   Highest education level: Not on file  Occupational History   Occupation: retired  Tobacco Use   Smoking status: Never   Smokeless tobacco: Current    Types: Chew  Substance and Sexual Activity   Alcohol use: No   Drug use: Never   Sexual activity: Not Currently  Other Topics Concern   Not on file  Social History Narrative   Not on file   Social Determinants of Health   Financial Resource Strain: Low Risk  (12/27/2022)   Overall Financial Resource Strain (CARDIA)    Difficulty of Paying Living Expenses: Not hard at all  Food Insecurity: No Food Insecurity (01/06/2023)   Hunger Vital Sign    Worried About Running Out of Food in the Last Year: Never true    Ran Out of Food in the Last Year: Never true  Transportation Needs: No Transportation Needs (01/06/2023)   PRAPARE - Administrator, Civil Service (Medical): No    Lack of Transportation (Non-Medical): No  Physical Activity: Not on file  Stress: Stress Concern Present (12/27/2022)   [employer] of Occupational Health - Occupational Stress Questionnaire    Feeling of Stress : To  some extent  Social Connections: Moderately Integrated (12/27/2022)   Social Connection and Isolation Panel [NHANES]    Frequency of Communication with Friends and Family: More than three times a week    Frequency of Social Gatherings with Friends and Family: More than three times a week    Attends Religious Services: More than 4 times per year    Active Member of [employer] or Organizations: Yes    Attends Engineer, structural: More than 4 times per year    Marital Status: Never married     Family History:  The patient's family history includes Cancer in his father.  ROS:   12-point review of systems is negative unless otherwise noted in the HPI.   EKGs/Labs/Other Studies Reviewed:    Studies reviewed were summarized above. The additional studies were reviewed today:  R/LHC 01/10/2023:    Ost RCA to Prox RCA lesion is 40% stenosed.   Mid RCA lesion is 40% stenosed.   RPAV lesion is 30% stenosed.   Mid LM to Dist LM lesion is 20% stenosed.   Mid LAD-2 lesion is 100% stenosed.   Mid LAD-1 lesion is 100% stenosed.   Lat 1st Diag lesion is 90% stenosed.   Prox Cx to Mid Cx lesion is 99% stenosed.   Ost Cx to Prox Cx lesion is 70% stenosed.   A drug-eluting stent was successfully placed using a STENT ONYX FRONTIER 2.25X38.   A drug-eluting stent was successfully placed using a STENT ONYX FRONTIER 2.5X12.   Post intervention, there is a 0% residual stenosis.   Post intervention, there is a 0% residual stenosis.   1.  Heavily calcified coronary arteries with severe two-vessel coronary artery disease.  Chronically occluded mid LAD stent with faint left to left collaterals.  Severe in-stent restenosis of the mid left circumflex as well as severe calcified stenosis proximally.  The RCA has moderate nonobstructive disease. 2.  Left ventricular angiography was not performed.  EF was moderately reduced by echo. 3.  Right heart catheterization showed low filling pressures, normal pulmonary pressure and normal cardiac output. 4.  Successful complex angioplasty and 2 overlapped drug-eluting stent placement to the left circumflex extending to the ostium.  Difficult procedure due to diffuse calcified disease and difficulty delivering stents.  An extension guide catheter had to be used with predilation to high-pressure with a score flex balloon.   Recommendations: Treat the rest of coronary artery disease medically. The patient was loaded with clopidogrel which should be continued for at least 1 year.  Aspirin can be discontinued once the patient is started on oral anticoagulation for LV thrombus and atrial flutter.  For now, will resume heparin drip in 4 hours. __________  2D echo 01/03/2023: 1. There is a 3.3 x 1.7 cm mural thrombus near the left ventricular apex.  Left ventricular ejection  fraction, by estimation, is 35 to 40%. The left  ventricle has moderately decreased function. The left ventricle  demonstrates regional wall motion  abnormalities (see scoring diagram/findings for description). Left  ventricular diastolic parameters are consistent with Grade I diastolic  dysfunction (impaired relaxation). There is akinesis of the left  ventricular, mid-apical anterior wall and  anteroseptal wall. There is akinesis of the left ventricular, apical  inferior segment. There is akinesis of the left ventricular, apical  segment.   2. Right ventricular systolic function is normal. The right ventricular  size is mildly enlarged. There is normal pulmonary artery systolic  pressure.   3. The mitral valve  is abnormal. Trivial mitral valve regurgitation. No  evidence of mitral stenosis.   4. The aortic valve has an indeterminant number of cusps. There is  moderate thickening of the aortic valve. Aortic valve regurgitation is  trivial. Aortic valve sclerosis/calcification is present, without any  evidence of aortic stenosis.   5. The inferior vena cava is normal in size with greater than 50%  respiratory variability, suggesting right atrial pressure of 3 mmHg.    EKG:  EKG is ordered today.  The EKG ordered today demonstrates NSR, 75 bpm, occasional isolated PVCs in a pattern of ventricular bigeminy, baseline, no acute ST-T changes  Recent Labs: 02/16/2023: ALT 18; BUN 25; Creatinine 0.89; Hemoglobin 9.2; Magnesium 2.1; Platelet Count 171; Potassium 4.6; Sodium 136  Recent Lipid Panel    Component Value Date/Time   CHOL 138 01/05/2023 0530   TRIG 84 01/05/2023 0530   HDL 29 (L) 01/05/2023 0530   CHOLHDL 4.8 01/05/2023 0530   VLDL 17 01/05/2023 0530   LDLCALC 92 01/05/2023 0530    PHYSICAL EXAM:    VS:  BP (!) 108/56 (BP Location: Left Arm, Patient Position: Sitting, Cuff Size: Normal)   Pulse 75   Ht 5\' 10"  (1.778 m)   Wt 176 lb 6.4 oz (80 kg)   SpO2 99%   BMI 25.31  kg/m   BMI: Body mass index is 25.31 kg/m.  Physical Exam Vitals reviewed.  Constitutional:      Appearance: He is well-developed.  HENT:     Head: Normocephalic and atraumatic.  Eyes:     General:        Right eye: No discharge.        Left eye: No discharge.  Neck:     Vascular: No JVD.  Cardiovascular:     Rate and Rhythm: Normal rate and regular rhythm.     Heart sounds: Normal heart sounds, S1 normal and S2 normal. Heart sounds not distant. No midsystolic click and no opening snap. No murmur heard.    No friction rub.     Comments: No right radial arteriotomy site complication. Pulmonary:     Effort: Pulmonary effort is normal. No respiratory distress.     Breath sounds: Normal breath sounds. No decreased breath sounds, wheezing or rales.  Chest:     Chest wall: No tenderness.  Abdominal:     General: There is no distension.  Musculoskeletal:     Cervical back: Normal range of motion.     Right lower leg: No edema.     Left lower leg: No edema.  Skin:    General: Skin is warm and dry.     Nails: There is no clubbing.  Neurological:     Mental Status: He is alert and oriented to person, place, and time.  Psychiatric:        Speech: Speech normal.        Behavior: Behavior normal.        Thought Content: Thought content normal.        Judgment: Judgment normal.     Wt Readings from Last 3 Encounters:  02/17/23 176 lb 6.4 oz (80 kg)  02/16/23 173 lb 9.6 oz (78.7 kg)  02/03/23 170 lb 8 oz (77.3 kg)     ASSESSMENT & PLAN:   CAD involving native coronary arteries without angina: He is doing well and without symptoms concerning for angina or cardiac decompensation.  Continue clopidogrel with apixaban (not on aspirin in an effort to avoid triple  therapy given the need for [facility] with atrial flutter and mural thrombus) for at least 12 months dating back to date of PCI (01/10/2023).  Continue aggressive risk factor modification and secondary prevention including metoprolol  and atorvastatin.  He has been referred to cardiac rehab.  No indication for further ischemic testing at this time.  HFrEF secondary to ICM: He is euvolemic and well compensated.  NYHA class is difficult to assess secondary to limited functional status in the setting of his comorbid conditions.  Relative hypotension precludes escalation of GDMT.  He has been managed with Lopressor over carvedilol given relative hypotension and overall Toprol-XL given G-tube.  Not requiring a standing loop diuretic.  Recommend obtaining echo in 2 months time following PCI to evaluate for improvement in cardiomyopathy.  Mural thrombus: Remains on apixaban 5 mg twice daily.  Though this is off label use of DOAC, it has been felt that warfarin is not a good option for [facility] given underlying comorbid conditions with active malignancy and baseline anemia/thrombocytopenia.  Plan to repeat limited echo in late 03/2023 to evaluate for resolution of LV thrombus.  Monitoring of hemoglobin and platelet count is being done closely through oncology.  Atrial flutter: Maintaining sinus rhythm.  Remains on Toprol-XL 12.5 mg daily.  CHA2DS2-VASc at least 5 (CHF, HTN, age x 1, DM, vascular disease).  Remains on apixaban 5 mg twice daily and does not meet reduced dosing criteria.  Labs to monitor closely through oncology.  Frequent PVCs: Asymptomatic.  Has been taking Lopressor 25 mg once daily rather than 12.5 mg twice daily.  Encouraged him to take Lopressor 12.5 mg twice daily, this may improve his ventricular ectopy.  Relative hypotension precludes escalation of beta-blocker.  Potassium and magnesium from yesterday at goal.  No indication for AAT at this time.  HTN: Blood pressure is soft, though stable in the office today at 108/56.  Lopressor as above.  HLD: LDL 92 in 12/2022 with goal being < 55.  Now on atorvastatin 80 mg.  Follow-up fasting lipid panel and LFT in 1 to 2 months with recommendation to escalate lipid-lowering therapy as  indicated to achieve target LDL.  History of DVT: On apixaban as outlined above.  Oropharyngeal cancer status postgastrostomy tube on chemoradiation with concurrent anemia and intermittent thrombocytopenia: Ongoing management per oncology.    Disposition: F/u with Dr. Kirke Corin or an APP in 3 months.   Medication Adjustments/Labs and Tests Ordered: Current medicines are reviewed at length with the patient today.  Concerns regarding medicines are outlined above. Medication changes, Labs and Tests ordered today are summarized above and listed in the Patient Instructions accessible in Encounters.   Signed, Eula Listen, PA-C 02/17/2023 2:23 PM     [facility] [place] [address] [place], [place] [address] [phone]

## 2023-02-16 ENCOUNTER — Inpatient Hospital Stay: Payer: Medicare Other

## 2023-02-16 ENCOUNTER — Encounter: Payer: Self-pay | Admitting: Oncology

## 2023-02-16 ENCOUNTER — Other Ambulatory Visit: Payer: Self-pay | Admitting: *Deleted

## 2023-02-16 ENCOUNTER — Inpatient Hospital Stay (HOSPITAL_BASED_OUTPATIENT_CLINIC_OR_DEPARTMENT_OTHER): Payer: Medicare Other | Admitting: Oncology

## 2023-02-16 ENCOUNTER — Ambulatory Visit: Payer: Medicare Other | Admitting: Speech Pathology

## 2023-02-16 VITALS — BP 104/70 | HR 59 | Temp 96.5°F | Resp 18 | Wt 173.6 lb

## 2023-02-16 DIAGNOSIS — E43 Unspecified severe protein-calorie malnutrition: Secondary | ICD-10-CM | POA: Diagnosis not present

## 2023-02-16 DIAGNOSIS — G893 Neoplasm related pain (acute) (chronic): Secondary | ICD-10-CM | POA: Diagnosis not present

## 2023-02-16 DIAGNOSIS — Z510 Encounter for antineoplastic radiation therapy: Secondary | ICD-10-CM | POA: Diagnosis not present

## 2023-02-16 DIAGNOSIS — C770 Secondary and unspecified malignant neoplasm of lymph nodes of head, face and neck: Secondary | ICD-10-CM | POA: Diagnosis not present

## 2023-02-16 DIAGNOSIS — C109 Malignant neoplasm of oropharynx, unspecified: Secondary | ICD-10-CM

## 2023-02-16 DIAGNOSIS — Z931 Gastrostomy status: Secondary | ICD-10-CM

## 2023-02-16 DIAGNOSIS — C029 Malignant neoplasm of tongue, unspecified: Secondary | ICD-10-CM | POA: Diagnosis not present

## 2023-02-16 DIAGNOSIS — Z7982 Long term (current) use of aspirin: Secondary | ICD-10-CM | POA: Diagnosis not present

## 2023-02-16 DIAGNOSIS — Z5111 Encounter for antineoplastic chemotherapy: Secondary | ICD-10-CM | POA: Diagnosis not present

## 2023-02-16 DIAGNOSIS — I513 Intracardiac thrombosis, not elsewhere classified: Secondary | ICD-10-CM | POA: Diagnosis not present

## 2023-02-16 DIAGNOSIS — Z86718 Personal history of other venous thrombosis and embolism: Secondary | ICD-10-CM | POA: Diagnosis not present

## 2023-02-16 DIAGNOSIS — Z79899 Other long term (current) drug therapy: Secondary | ICD-10-CM | POA: Diagnosis not present

## 2023-02-16 DIAGNOSIS — R1310 Dysphagia, unspecified: Secondary | ICD-10-CM | POA: Diagnosis not present

## 2023-02-16 LAB — CBC WITH DIFFERENTIAL (CANCER CENTER ONLY)
Abs Immature Granulocytes: 0.02 10*3/uL (ref 0.00–0.07)
Basophils Absolute: 0 10*3/uL (ref 0.0–0.1)
Basophils Relative: 1 %
Eosinophils Absolute: 0 10*3/uL (ref 0.0–0.5)
Eosinophils Relative: 1 %
HCT: 27.1 % — ABNORMAL LOW (ref 39.0–52.0)
Hemoglobin: 9.2 g/dL — ABNORMAL LOW (ref 13.0–17.0)
Immature Granulocytes: 1 %
Lymphocytes Relative: 17 %
Lymphs Abs: 0.6 10*3/uL — ABNORMAL LOW (ref 0.7–4.0)
MCH: 35.2 pg — ABNORMAL HIGH (ref 26.0–34.0)
MCHC: 33.9 g/dL (ref 30.0–36.0)
MCV: 103.8 fL — ABNORMAL HIGH (ref 80.0–100.0)
Monocytes Absolute: 0.8 10*3/uL (ref 0.1–1.0)
Monocytes Relative: 22 %
Neutro Abs: 2.3 10*3/uL (ref 1.7–7.7)
Neutrophils Relative %: 58 %
Platelet Count: 171 10*3/uL (ref 150–400)
RBC: 2.61 MIL/uL — ABNORMAL LOW (ref 4.22–5.81)
RDW: 20.1 % — ABNORMAL HIGH (ref 11.5–15.5)
WBC Count: 3.8 10*3/uL — ABNORMAL LOW (ref 4.0–10.5)
nRBC: 0.5 % — ABNORMAL HIGH (ref 0.0–0.2)

## 2023-02-16 LAB — CMP (CANCER CENTER ONLY)
ALT: 18 U/L (ref 0–44)
AST: 19 U/L (ref 15–41)
Albumin: 3.5 g/dL (ref 3.5–5.0)
Alkaline Phosphatase: 74 U/L (ref 38–126)
Anion gap: 8 (ref 5–15)
BUN: 25 mg/dL — ABNORMAL HIGH (ref 8–23)
CO2: 29 mmol/L (ref 22–32)
Calcium: 8.9 mg/dL (ref 8.9–10.3)
Chloride: 99 mmol/L (ref 98–111)
Creatinine: 0.89 mg/dL (ref 0.61–1.24)
GFR, Estimated: >60 mL/min (ref >60)
Glucose, Bld: 230 mg/dL — ABNORMAL HIGH (ref 70–99)
Potassium: 4.6 mmol/L (ref 3.5–5.1)
Sodium: 136 mmol/L (ref 135–145)
Total Bilirubin: 0.3 mg/dL (ref 0.3–1.2)
Total Protein: 6.3 g/dL — ABNORMAL LOW (ref 6.5–8.1)

## 2023-02-16 LAB — MAGNESIUM: Magnesium: 2.1 mg/dL (ref 1.7–2.4)

## 2023-02-16 MED ORDER — SODIUM CHLORIDE 0.9 % IV SOLN
Freq: Once | INTRAVENOUS | Status: AC
  Filled 2023-02-16: qty 250

## 2023-02-16 MED ORDER — AMOXICILLIN-POT CLAVULANATE 200-28.5 MG/5ML PO SUSR: 600.0000 mg | Freq: Two times a day (BID) | ORAL | 0 refills | Status: DC

## 2023-02-16 NOTE — Assessment & Plan Note (Signed)
LV thrombus and Atrial Flutter,ejection fraction 35 to 40%,  Continue Eliquis 5mg BID 

## 2023-02-16 NOTE — Assessment & Plan Note (Signed)
stage IVA oropharyngeal squamous cell carcinoma, Patient is on concurrent chemotherapy with Radiation.  Labs are reviewed and discussed with patient. Proceed with cisplatin 40 mg/m2 He is finishing chemotherapy and radiation.  Plan PET restaging in August. .

## 2023-02-16 NOTE — Assessment & Plan Note (Signed)
On Tube Feeding. Follow up with nutritionist. Weight is stable.  

## 2023-02-16 NOTE — Assessment & Plan Note (Addendum)
Patient can not care for his PEG tube Dressing was changed here in the clinic.  Home care only provides education but he can not do the change by himself.  He will return to our clinic next week for recheck tube site and dressing change.   He has mild tenderness and erythematous changes, possible cellulitis Recommend Augmentin suspension 600mg  BID per tube for 5 days.

## 2023-02-16 NOTE — Progress Notes (Signed)
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  Labs are reviewed and discussed with patient. Proceed with cisplatin 40 mg/m2 He is finishing chemotherapy and radiation.  Plan PET restaging in August. .   Protein-calorie malnutrition, severe (HCC) On Tube Feeding. Follow up with nutritionist. Weight is stable.   LV (left ventricular) mural thrombus LV thrombus and Atrial Flutter,ejection fraction 35 to 40%,  Continue Eliquis 5mg  BID  S/P percutaneous endoscopic gastrostomy (PEG) tube placement [facility]) Patient can not care for his PEG tube Dressing was changed here in the clinic.  Home care only provides education but he can not do the change by himself.  He will return to our clinic next week for recheck tube site and dressing change.   He has mild tenderness and erythematous changes, possible cellulitis Recommend Augmentin suspension 600mg  BID per tube for 5 days.     Orders Placed This Encounter  Procedures   CMP (Cancer Center only)    Standing Status:   Future    Standing Expiration Date:   02/16/2024   CBC with Differential (Cancer Center Only)    Standing Status:   Future    Standing Expiration Date:   02/16/2024   CBC with Differential (Cancer Center Only)    Standing Status:   Future    Standing Expiration Date:   02/16/2024   CMP (Cancer Center only)    Standing Status:   Future    Standing Expiration Date:   02/16/2024   Magnesium    Standing Status:   Future    Standing Expiration Date:   02/16/2024    Follow-up 1 week lab NP 2 weeks  lab MD. .  All questions were answered. The patient knows to call the clinic with any problems, questions or concerns.  [name], MD, PhD [facility] Hematology Oncology 02/16/2023   HISTORY OF PRESENTING ILLNESS:  Patient presented outpatient ultrasound of his left lower extremity showing positive for DVT. Denies any recent,, surgery, travel or issue.  Denies any family history of DVT.  Reports that he developed left lower extremity swelling for a week, associated with moderate severity throbbing aching discomfort feels like pulling a muscle.  Denies any shortness of breath. Patient was started on Xarelto starting kit and advised to follow-up with heme-onc.  INTERVAL HISTORY ANNETTE LIOTTA is a 72 y.o. male presents to re-establish care for oropharyngeal squamous cell carcinoma  Oncology History  Oropharyngeal cancer (HCC)  09/28/2022 Imaging   CT soft tissue neck with contrast showed infiltrating mass in the right tongue with multiple ipsilateral nodal metastases. The primary mass measures at least 4 cm and infiltrates parapharyngeal fat, visualization of tumor extent is limited by artifact from dental amalgam.   10/28/2022 Initial Diagnosis   Squamous cell carcinoma of tongue (HCC) Patient has throat pain and was evaluated by ENT Dr.Bennett.  Physical examination showed cervical lymphadenopathy.  10/15/2022 ultrasound-guided biopsy of right cervical lymph node showed fibrous tissue, detach minute salivary gland tissue.  Granular debris's and a few dysplastic squamous epithelial cells, nondiagnostic.  10/22/2022 repeat ultrasound-guided biopsy of right cervical lymph node was positive  for malignancy, metastatic squamous cell carcinoma, keratinizing, in a background of dense fibrosis and tumor necrosis.   10/28/2022 Cancer Staging   Staging form: Oral Cavity, AJCC 8th Edition - Clinical: Stage IVA (cT3, cN2a, cM0) - Signed by [name], MD on 11/02/2022   11/16/2022 Imaging   PET scan  showed 1. Large infiltrating right oropharyngeal mass is markedly hypermetabolic and consistent with known squamous cell carcinoma. 2. Bilateral hypermetabolic metastatic cervical lymphadenopathy. 3. No evidence of metastatic disease involving the chest, abdomen/pelvis or bony structures.     12/09/2022 -  Chemotherapy   Patient is on Treatment Plan : HEAD/NECK Cisplatin (40) q7d     01/03/2023 - 01/11/2023 Hospital Admission   Mr. Turhan Chill is a 72 year old male with non-insulin-dependent diabetes mellitus, hyperlipidemia, history of DVT on Xarelto, oropharyngeal squamous cell carcinoma stage IVa, who presents emergency department from oncology clinic for chief concerns of syncope event at home while he was in the shower. High sensitive troponin is 2925.  Telemetry showed atrial flutter, he was initially given diltiazem gtt., and quickly converted to sinus. Patient also had a significant dysphagia from oropharyngeal cancer, G tube is placed on 5/17, TF started.  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  2 drug-eluting stents were placed.  Patient is placed on Plavix.     01/07/2023 Procedure   S/p gastrostomy tube.  placement.  5/22 G tube tip was broken and Luer-Lok tip was replaced in ER    INTERVAL HISTORY CRESCENCIO JOZWIAK is a 72 y.o. male who has above history reviewed by me today presents for follow up visit for P16 + oropharyngeal squamous cell carcinoma S/p  concurrent chemotherapy with radiation.  Today he reports feeling better. Still has fatigue He tolerates tube feeding.  He has difficulties changing tube dressing  Denies nausea vomiting, diarrhea, abdominal pain.   Review of Systems  Constitutional:  Positive for malaise/fatigue. Negative for chills, fever and weight loss.  HENT:  Negative  for sore throat.   Eyes:  Negative for redness.  Respiratory:  Negative for cough, shortness of breath and wheezing.   Cardiovascular:  Negative for chest pain and palpitations.  Gastrointestinal:  Negative for abdominal pain, blood in stool, nausea and vomiting.  Genitourinary:  Negative for dysuria.  Musculoskeletal:  Negative for myalgias.  Skin:  Negative for rash.  Neurological:  Negative for dizziness, tingling and tremors.  Endo/Heme/Allergies:  Does not bruise/bleed easily.  Psychiatric/Behavioral:  Negative for hallucinations.     MEDICAL HISTORY:  Past Medical History:  Diagnosis Date   Diabetes mellitus without complication (HCC)    Hyperlipidemia    Hypertension    Oropharyngeal cancer (HCC)     SURGICAL HISTORY: Past Surgical History:  Procedure Laterality Date   arm surgery Left    CORONARY ANGIOPLASTY WITH STENT PLACEMENT     CORONARY STENT INTERVENTION N/A 01/10/2023   Procedure: CORONARY STENT INTERVENTION;  Surgeon: Iran Ouch, MD;  Location: [facility] INVASIVE CV LAB;  Service: Cardiovascular;  Laterality: N/A;   IR GASTROSTOMY TUBE MOD SED  01/07/2023   left side rotator cuff surgery     RIGHT/LEFT HEART CATH AND CORONARY ANGIOGRAPHY N/A 01/10/2023   Procedure: RIGHT/LEFT HEART CATH AND CORONARY ANGIOGRAPHY;  Surgeon: Iran Ouch, MD;  Location: [facility] INVASIVE CV LAB;  Service: Cardiovascular;  Laterality: N/A;  SHOULDER SURGERY      SOCIAL HISTORY: Social History   Socioeconomic History   Marital status: Single    Spouse name: Not on file   Number of children: Not on file   Years of education: Not on file   Highest education level: Not on file  Occupational History   Occupation: retired  Tobacco Use   Smoking status: Never   Smokeless tobacco: Current    Types: Chew  Substance and Sexual Activity   Alcohol use: No   Drug use: Never   Sexual activity: Not Currently  Other Topics Concern   Not on file  Social History Narrative   Not on  file   Social Determinants of Health   Financial Resource Strain: Low Risk  (12/27/2022)   Overall Financial Resource Strain (CARDIA)    Difficulty of Paying Living Expenses: Not hard at all  Food Insecurity: No Food Insecurity (01/06/2023)   Hunger Vital Sign    Worried About Running Out of Food in the Last Year: Never true    Ran Out of Food in the Last Year: Never true  Transportation Needs: No Transportation Needs (01/06/2023)   PRAPARE - Administrator, Civil Service (Medical): No    Lack of Transportation (Non-Medical): No  Physical Activity: Not on file  Stress: Stress Concern Present (12/27/2022)   [employer] of Occupational Health - Occupational Stress Questionnaire    Feeling of Stress : To some extent  Social Connections: Moderately Integrated (12/27/2022)   Social Connection and Isolation Panel [NHANES]    Frequency of Communication with Friends and Family: More than three times a week    Frequency of Social Gatherings with Friends and Family: More than three times a week    Attends Religious Services: More than 4 times per year    Active Member of [employer] or Organizations: Yes    Attends Banker Meetings: More than 4 times per year    Marital Status: Never married  Intimate Partner Violence: Not At Risk (01/06/2023)   Humiliation, Afraid, Rape, and Kick questionnaire    Fear of Current or Ex-Partner: No    Emotionally Abused: No    Physically Abused: No    Sexually Abused: No    FAMILY HISTORY: Family History  Problem Relation Age of Onset   Cancer Father        lung    ALLERGIES:  has No Known Allergies.  MEDICATIONS:  Current Outpatient Medications  Medication Sig Dispense Refill   amoxicillin-clavulanate (AUGMENTIN) 200-28.5 MG/5ML suspension Place 15 mLs (600 mg total) into feeding tube 2 (two) times daily. 150 mL 0   apixaban (ELIQUIS) 5 MG TABS tablet Place 1 tablet (5 mg total) into feeding tube 2 (two) times daily. 60 tablet 0    atorvastatin (LIPITOR) 80 MG tablet Place 1 tablet (80 mg total) into feeding tube daily. 30 tablet 0   clopidogrel (PLAVIX) 75 MG tablet Place 1 tablet (75 mg total) into feeding tube daily with breakfast. 30 tablet 0   empagliflozin (JARDIANCE) 10 MG TABS tablet Take 1 tablet (10 mg total) by mouth daily. 30 tablet 0   metoprolol tartrate (LOPRESSOR) 25 MG tablet Place 0.5 tablets (12.5 mg total) into feeding tube 2 (two) times daily. 60 tablet 0   Nutritional Supplements (FEEDING SUPPLEMENT, OSMOLITE 1.5 CAL,) LIQD Give 2 cartons at 8am, noon, 4pm feeding via feeding tube.  Give 1 carton at 8pm via feeding tube.  Flush with 60ml water before  and after each feeding.  Flush with additional TID between feedings to better meet hydration needs. Send bolus supplies. 1659 mL 0   Protein (FEEDING SUPPLEMENT, PROSOURCE TF20,) liquid Place 60 mLs into feeding tube 2 (two) times daily.     Water For Irrigation, Sterile (FREE WATER) SOLN Place 200 mLs into feeding tube 4 (four) times daily - after meals and at bedtime.     oxyCODONE (OXY IR/ROXICODONE) 5 MG immediate release tablet Place 1 tablet (5 mg total) into feeding tube every 6 (six) hours as needed for severe pain. (Patient not taking: Reported on 02/03/2023) 30 tablet 0   No current facility-administered medications for this visit.     PHYSICAL EXAMINATION: ECOG PERFORMANCE STATUS: 1 - Symptomatic but completely ambulatory Vitals:   02/16/23 0940  BP: 104/70  Pulse: (!) 59  Resp: 18  Temp: (!) 96.5 F (35.8 C)   Filed Weights   02/16/23 0940  Weight: 173 lb 9.6 oz (78.7 kg)    Physical Exam Constitutional:      General: He is not in acute distress. HENT:     Head: Normocephalic and atraumatic.     Mouth/Throat:     Comments: Restricted mouth opening/trismus Thrush Eyes:     General: No scleral icterus. Cardiovascular:     Rate and Rhythm: Normal rate.  Pulmonary:     Effort: Pulmonary effort is normal. No respiratory  distress.     Breath sounds: Normal breath sounds. No wheezing.  Abdominal:     General: Bowel sounds are normal. There is no distension.     Palpations: Abdomen is soft.     Comments: PEG tube site tenderness with erythematous changes  Musculoskeletal:        General: No swelling. Normal range of motion.     Cervical back: Normal range of motion and neck supple.  Lymphadenopathy:     Cervical: Cervical adenopathy present.  Skin:    General: Skin is warm and dry.     Findings: No erythema or rash.  Neurological:     Mental Status: He is alert and oriented to person, place, and time. Mental status is at baseline.     Cranial Nerves: No cranial nerve deficit.     Coordination: Coordination normal.  Psychiatric:        Mood and Affect: Mood normal.      LABORATORY DATA:  I have reviewed the data as listed     Latest Ref Rng & Units 02/16/2023    9:28 AM 02/03/2023    8:50 AM 01/27/2023    9:19 AM  CBC  WBC 4.0 - 10.5 K/uL 3.8  4.9  5.8   Hemoglobin 13.0 - 17.0 g/dL 9.2  91.4  78.2   Hematocrit 39.0 - 52.0 % 27.1  29.5  31.4   Platelets 150 - 400 K/uL 171  151  278       Latest Ref Rng & Units 02/16/2023    9:29 AM 02/03/2023    8:51 AM 01/27/2023    9:19 AM  CMP  Glucose 70 - 99 mg/dL 956  213  086   BUN 8 - 23 mg/dL 25  25  21    Creatinine 0.61 - 1.24 mg/dL 5.78  4.69  6.29   Sodium 135 - 145 mmol/L 136  137  137   Potassium 3.5 - 5.1 mmol/L 4.6  4.4  4.9   Chloride 98 - 111 mmol/L 99  99  97   CO2 22 -  32 mmol/L 29  30  29    Calcium 8.9 - 10.3 mg/dL 8.9  9.1  8.9   Total Protein 6.5 - 8.1 g/dL 6.3  6.4  6.2   Total Bilirubin 0.3 - 1.2 mg/dL 0.3  0.5  0.4   Alkaline Phos 38 - 126 U/L 74  75  67   AST 15 - 41 U/L 19  26  25    ALT 0 - 44 U/L 18  26  31

## 2023-02-17 ENCOUNTER — Encounter: Payer: Self-pay | Admitting: Physician Assistant

## 2023-02-17 ENCOUNTER — Ambulatory Visit: Payer: Medicare Other | Attending: Physician Assistant | Admitting: Physician Assistant

## 2023-02-17 VITALS — BP 108/56 | HR 75 | Ht 70.0 in | Wt 176.4 lb

## 2023-02-17 DIAGNOSIS — I2510 Atherosclerotic heart disease of native coronary artery without angina pectoris: Secondary | ICD-10-CM | POA: Diagnosis not present

## 2023-02-17 DIAGNOSIS — I513 Intracardiac thrombosis, not elsewhere classified: Secondary | ICD-10-CM | POA: Diagnosis not present

## 2023-02-17 DIAGNOSIS — I10 Essential (primary) hypertension: Secondary | ICD-10-CM

## 2023-02-17 DIAGNOSIS — D649 Anemia, unspecified: Secondary | ICD-10-CM

## 2023-02-17 DIAGNOSIS — Z86718 Personal history of other venous thrombosis and embolism: Secondary | ICD-10-CM | POA: Diagnosis not present

## 2023-02-17 DIAGNOSIS — I5020 Unspecified systolic (congestive) heart failure: Secondary | ICD-10-CM | POA: Diagnosis not present

## 2023-02-17 DIAGNOSIS — I214 Non-ST elevation (NSTEMI) myocardial infarction: Secondary | ICD-10-CM | POA: Diagnosis not present

## 2023-02-17 DIAGNOSIS — I493 Ventricular premature depolarization: Secondary | ICD-10-CM

## 2023-02-17 DIAGNOSIS — E785 Hyperlipidemia, unspecified: Secondary | ICD-10-CM

## 2023-02-17 DIAGNOSIS — Z931 Gastrostomy status: Secondary | ICD-10-CM | POA: Diagnosis not present

## 2023-02-17 DIAGNOSIS — I4892 Unspecified atrial flutter: Secondary | ICD-10-CM

## 2023-02-17 DIAGNOSIS — I255 Ischemic cardiomyopathy: Secondary | ICD-10-CM

## 2023-02-17 DIAGNOSIS — C109 Malignant neoplasm of oropharynx, unspecified: Secondary | ICD-10-CM | POA: Diagnosis not present

## 2023-02-17 NOTE — Patient Instructions (Signed)
Medication Instructions:  No changes at this time.   *If you need a refill on your cardiac medications before your next appointment, please call your pharmacy*   Lab Work: None  If you have labs (blood work) drawn today and your tests are completely normal, you will receive your results only by: MyChart Message (if you have MyChart) OR A paper copy in the mail If you have any lab test that is abnormal or we need to change your treatment, we will call you to review the results.   Testing/Procedures: Your physician has requested that you have an echocardiogram in 2 months (after 04/05/23). Echocardiography is a painless test that uses sound waves to create images of your heart. It provides your doctor with information about the size and shape of your heart and how well your heart's chambers and valves are working. This procedure takes approximately one hour. There are no restrictions for this procedure. Please do NOT wear cologne, perfume, aftershave, or lotions (deodorant is allowed). Please arrive 15 minutes prior to your appointment time.    Follow-Up: At [facility], you and your health needs are our priority.  As part of our continuing mission to provide you with exceptional heart care, we have created designated Provider Care Teams.  These Care Teams include your primary Cardiologist (physician) and Advanced Practice Providers (APPs -  Physician Assistants and Nurse Practitioners) who all work together to provide you with the care you need, when you need it.   Your next appointment:   3 month(s)  Provider:   [name], MD or Eula Listen, PA-C

## 2023-02-18 ENCOUNTER — Inpatient Hospital Stay: Payer: Medicare Other

## 2023-02-18 NOTE — Progress Notes (Signed)
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    Spoke with patient via phone for nutrition follow-up.  Patient reports that he is giving 12 oz of osmolite 1.5 QID via feeding tube (6 cartons/day).  Patient is flushing with 1 cup to 1 1/4 cups of water at each feeding.  He is giving additional 1 cup of water around noon for extra hydration. (5 cups plus little more water per day).  Reports feeling fatigued.  Had a bowel movement yesterday.  Uses stool softner as needed.     Medications: reviewed  Labs: 6/26 Na 136, glucose 230, BUN 25, creatinine 0.89  Anthropometrics:   Weight 176 lb 6.4 oz on 6/27 increased 170 lb 8 oz on 6/14 168 lb 12.8 oz on 5/31 168 lb 14.4 oz on 5/24 177 lb on 5/9 190 lb 4.8 oz on 4/25   Estimated Energy Needs  Kcals: 2150-2580 Protein: 108-129 g Fluid: 2150-2580 ml  NUTRITION DIAGNOSIS: Severe malnutrition improving   MALNUTRITION DIAGNOSIS: Severe malnutrition improving   INTERVENTION:  Continue 12 oz osmolite 1.5, 4 times per day via feeding tube.  Continue 5 cups water flush via tube daily (uses 1 cup - 1 1/4 cup water with each feeding).   Provides 2130 calories, 89 g protein and 2280 ml free water daily.  Confirmed that patient has [employer]'s phone number to re-order enteral supplies [phone]).   Patient has contact number.      MONITORING, EVALUATION, GOAL: weight trends, tube feeding   NEXT VISIT: Thursday, July 25 after follow-up with radiation  Kenneth Gilmore B. Freida Busman, RD, LDN Registered Dietitian [phone]

## 2023-02-19 ENCOUNTER — Other Ambulatory Visit: Payer: Self-pay

## 2023-02-21 ENCOUNTER — Inpatient Hospital Stay: Payer: Medicare Other | Attending: Oncology

## 2023-02-21 NOTE — Progress Notes (Signed)
[facility] CSW Progress Note  Clinical Child psychotherapist contacted patient by phone to assess needs.  RN, Ricki Rodriguez, contacted CSW regarding community resources to help patient at home.  Patient was in agreement.  CSW faxed a referral to  [facility].    Dorothey Baseman, LCSW Clinical Social Worker [facility]

## 2023-02-25 ENCOUNTER — Inpatient Hospital Stay (HOSPITAL_BASED_OUTPATIENT_CLINIC_OR_DEPARTMENT_OTHER): Payer: Medicare Other | Admitting: Nurse Practitioner

## 2023-02-25 ENCOUNTER — Telehealth: Payer: Self-pay | Admitting: Physician Assistant

## 2023-02-25 ENCOUNTER — Inpatient Hospital Stay: Payer: Medicare Other

## 2023-02-25 ENCOUNTER — Encounter: Payer: Self-pay | Admitting: Nurse Practitioner

## 2023-02-25 VITALS — BP 105/82 | HR 40 | Temp 96.2°F | Ht 70.0 in | Wt 175.0 lb

## 2023-02-25 DIAGNOSIS — Z7901 Long term (current) use of anticoagulants: Secondary | ICD-10-CM | POA: Diagnosis not present

## 2023-02-25 DIAGNOSIS — Z79899 Other long term (current) drug therapy: Secondary | ICD-10-CM | POA: Insufficient documentation

## 2023-02-25 DIAGNOSIS — E43 Unspecified severe protein-calorie malnutrition: Secondary | ICD-10-CM | POA: Insufficient documentation

## 2023-02-25 DIAGNOSIS — C109 Malignant neoplasm of oropharynx, unspecified: Secondary | ICD-10-CM

## 2023-02-25 DIAGNOSIS — R001 Bradycardia, unspecified: Secondary | ICD-10-CM

## 2023-02-25 DIAGNOSIS — Z9221 Personal history of antineoplastic chemotherapy: Secondary | ICD-10-CM | POA: Diagnosis not present

## 2023-02-25 DIAGNOSIS — F17220 Nicotine dependence, chewing tobacco, uncomplicated: Secondary | ICD-10-CM | POA: Insufficient documentation

## 2023-02-25 DIAGNOSIS — I4892 Unspecified atrial flutter: Secondary | ICD-10-CM | POA: Insufficient documentation

## 2023-02-25 DIAGNOSIS — G893 Neoplasm related pain (acute) (chronic): Secondary | ICD-10-CM | POA: Insufficient documentation

## 2023-02-25 DIAGNOSIS — Z931 Gastrostomy status: Secondary | ICD-10-CM

## 2023-02-25 DIAGNOSIS — Z923 Personal history of irradiation: Secondary | ICD-10-CM | POA: Diagnosis not present

## 2023-02-25 DIAGNOSIS — I493 Ventricular premature depolarization: Secondary | ICD-10-CM

## 2023-02-25 DIAGNOSIS — E860 Dehydration: Secondary | ICD-10-CM

## 2023-02-25 LAB — CBC WITH DIFFERENTIAL (CANCER CENTER ONLY)
Abs Immature Granulocytes: 0.04 10*3/uL (ref 0.00–0.07)
Basophils Absolute: 0 10*3/uL (ref 0.0–0.1)
Basophils Relative: 1 %
Eosinophils Absolute: 0 10*3/uL (ref 0.0–0.5)
Eosinophils Relative: 0 %
HCT: 28.9 % — ABNORMAL LOW (ref 39.0–52.0)
Hemoglobin: 9.6 g/dL — ABNORMAL LOW (ref 13.0–17.0)
Immature Granulocytes: 1 %
Lymphocytes Relative: 18 %
Lymphs Abs: 0.8 10*3/uL (ref 0.7–4.0)
MCH: 36.2 pg — ABNORMAL HIGH (ref 26.0–34.0)
MCHC: 33.2 g/dL (ref 30.0–36.0)
MCV: 109.1 fL — ABNORMAL HIGH (ref 80.0–100.0)
Monocytes Absolute: 0.9 10*3/uL (ref 0.1–1.0)
Monocytes Relative: 19 %
Neutro Abs: 2.8 10*3/uL (ref 1.7–7.7)
Neutrophils Relative %: 61 %
Platelet Count: 317 10*3/uL (ref 150–400)
RBC: 2.65 MIL/uL — ABNORMAL LOW (ref 4.22–5.81)
RDW: 22.8 % — ABNORMAL HIGH (ref 11.5–15.5)
WBC Count: 4.6 10*3/uL (ref 4.0–10.5)
nRBC: 0.6 % — ABNORMAL HIGH (ref 0.0–0.2)

## 2023-02-25 LAB — CMP (CANCER CENTER ONLY)
ALT: 19 U/L (ref 0–44)
AST: 22 U/L (ref 15–41)
Albumin: 3.5 g/dL (ref 3.5–5.0)
Alkaline Phosphatase: 65 U/L (ref 38–126)
Anion gap: 8 (ref 5–15)
BUN: 24 mg/dL — ABNORMAL HIGH (ref 8–23)
CO2: 30 mmol/L (ref 22–32)
Calcium: 9 mg/dL (ref 8.9–10.3)
Chloride: 98 mmol/L (ref 98–111)
Creatinine: 0.92 mg/dL (ref 0.61–1.24)
GFR, Estimated: >60 mL/min (ref >60)
Glucose, Bld: 263 mg/dL — ABNORMAL HIGH (ref 70–99)
Potassium: 4.9 mmol/L (ref 3.5–5.1)
Sodium: 136 mmol/L (ref 135–145)
Total Bilirubin: 0.3 mg/dL (ref 0.3–1.2)
Total Protein: 6.3 g/dL — ABNORMAL LOW (ref 6.5–8.1)

## 2023-02-25 MED ORDER — SODIUM CHLORIDE 0.9 % IV SOLN
INTRAVENOUS | Status: DC
  Filled 2023-02-25 (×2): qty 250

## 2023-02-25 NOTE — Telephone Encounter (Signed)
Contacted by oncology for heart rate in the 40s bpm.  Given patient's history of frequent PVCs and ventricular bigeminy, recommended EKG to be reviewed.  This was obtained and reviewed today which showed normal sinus rhythm, 72 bpm, frequent PVCs in a pattern of ventricular bigeminy with rare ventricular couplet and no acute ST-T changes.  Tracing was consistent with prior.  Patient was asymptomatic and hemodynamically stable.  Recommended patient be continued on metoprolol to tartrate 12.5 mg twice daily (relative hypotension precluded titration).  Most recent electrolytes at goal.  Our office will send out Zio patch to quantify PVC burden.  Pam, please mail patient ZIO XT for 3 days to quantify PVC burden, diagnosis PVCs

## 2023-02-25 NOTE — Progress Notes (Signed)
Hematology/Oncology Progress note [phone] [phone]     REASON FOR VISIT Follow up for oropharyngeal squamous cell carcinoma  HISTORY OF PRESENTING ILLNESS:  Patient presented outpatient ultrasound of his left lower extremity showing positive for DVT. Denies any recent,, surgery, travel or issue.  Denies any family history of DVT.  Reports that he developed left lower extremity swelling for a week, associated with moderate severity throbbing aching discomfort feels like pulling a muscle.  Denies any shortness of breath. Patient was started on Xarelto starting kit and advised to follow-up with heme-onc.  INTERVAL HISTORY Kenneth Gilmore ORDERS is a 72 y.o. male presents to re-establish care for oropharyngeal squamous cell carcinoma  Oncology History  Oropharyngeal cancer (HCC)  09/28/2022 Imaging   CT soft tissue neck with contrast showed infiltrating mass in the right tongue with multiple ipsilateral nodal metastases. The primary mass measures at least 4 cm and infiltrates parapharyngeal fat, visualization of tumor extent is limited by artifact from dental amalgam.   10/28/2022 Initial Diagnosis   Squamous cell carcinoma of tongue (HCC) Patient has throat pain and was evaluated by ENT Dr.Bennett.  Physical examination showed cervical lymphadenopathy.  10/15/2022 ultrasound-guided biopsy of right cervical lymph node showed fibrous tissue, detach minute salivary gland tissue.  Granular debris's and a few dysplastic squamous epithelial cells, nondiagnostic.  10/22/2022 repeat ultrasound-guided biopsy of right cervical lymph node was positive for malignancy, metastatic squamous cell carcinoma, keratinizing, in a background of dense fibrosis and tumor necrosis.   10/28/2022 Cancer Staging   Staging form: Oral Cavity, AJCC 8th Edition - Clinical: Stage IVA (cT3, cN2a, cM0) - Signed by [name], MD on 11/02/2022   11/16/2022 Imaging   PET scan showed 1. Large infiltrating right  oropharyngeal mass is markedly hypermetabolic and consistent with known squamous cell carcinoma. 2. Bilateral hypermetabolic metastatic cervical lymphadenopathy. 3. No evidence of metastatic disease involving the chest, abdomen/pelvis or bony structures.     12/09/2022 -  Chemotherapy   Patient is on Treatment Plan : HEAD/NECK Cisplatin (40) q7d     01/03/2023 - 01/11/2023 Hospital Admission   Mr. Kenneth Gilmore is a 72 year old male with non-insulin-dependent diabetes mellitus, hyperlipidemia, history of DVT on Xarelto, oropharyngeal squamous cell carcinoma stage IVa, who presents emergency department from oncology clinic for chief concerns of syncope event at home while he was in the shower. High sensitive troponin is 2925.  Telemetry showed atrial flutter, he was initially given diltiazem gtt., and quickly converted to sinus. Patient also had a significant dysphagia from oropharyngeal cancer, G tube is placed on 5/17, TF started.  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  2 drug-eluting stents were placed.  Patient is placed on Plavix.     01/07/2023 Procedure   S/p gastrostomy tube.  placement.  5/22 G tube tip was broken and Luer-Lok tip was replaced in ER    INTERVAL HISTORY DAELYN Gilmore is a 72 y.o. male with above history of P16 + oropharyngeal squamous cell carcinoma, s/p concurrent chemotherapy and radiation who returns to clinic for reevaluation of feeding tube, possible cellulitis. She continues to be fatigued and hopes to be able to take food by mouth one day. Is managing secretions. Completed augmentin. Overall is starting to feel stronger though. Worried about starting physical activity with feeding tube.   Review of Systems  Constitutional:  Positive for malaise/fatigue. Negative for chills, fever  and weight loss.  HENT:  Negative for sore throat.        Dry mouth; thick secretions  Eyes:  Negative for redness.  Respiratory:  Negative for cough, shortness of breath and wheezing.   Cardiovascular:  Negative for chest pain and palpitations.  Gastrointestinal:  Negative for abdominal pain, blood in stool, nausea and vomiting.       Peg  Genitourinary:  Negative for dysuria.  Musculoskeletal:  Negative for myalgias.  Skin:  Negative for rash.  Neurological:  Negative for dizziness, tingling, tremors and weakness.  Endo/Heme/Allergies:  Does not bruise/bleed easily.  Psychiatric/Behavioral:  Negative for depression and hallucinations. The patient is not nervous/anxious.     MEDICAL HISTORY:  Past Medical History:  Diagnosis Date   Diabetes mellitus without complication (HCC)    Hyperlipidemia    Hypertension    Oropharyngeal cancer (HCC)     SURGICAL HISTORY: Past Surgical History:  Procedure Laterality Date   arm surgery Left    CORONARY ANGIOPLASTY WITH STENT PLACEMENT     CORONARY STENT INTERVENTION N/A 01/10/2023   Procedure: CORONARY STENT INTERVENTION;  Surgeon: Iran Ouch, MD;  Location: [facility];  Service: Cardiovascular;  Laterality: N/A;   IR GASTROSTOMY TUBE MOD SED  01/07/2023   left side rotator cuff surgery     RIGHT/LEFT HEART CATH AND CORONARY ANGIOGRAPHY N/A 01/10/2023   Procedure: RIGHT/LEFT HEART CATH AND CORONARY ANGIOGRAPHY;  Surgeon: Iran Ouch, MD;  Location: [facility];  Service: Cardiovascular;  Laterality: N/A;   SHOULDER SURGERY      SOCIAL HISTORY: Social History   Socioeconomic History   Marital status: Single    Spouse name: Not on file   Number of children: Not on file   Years of education: Not on file   Highest education level: Not on file  Occupational History   Occupation: retired  Tobacco Use   Smoking status: Never   Smokeless tobacco: Current    Types: Chew  Substance and Sexual Activity    Alcohol use: No   Drug use: Never   Sexual activity: Not Currently  Other Topics Concern   Not on file  Social History Narrative   Not on file   Social Determinants of Health   Financial Resource Strain: Low Risk  (12/27/2022)   Overall Financial Resource Strain (CARDIA)    Difficulty of Paying Living Expenses: Not hard at all  Food Insecurity: No Food Insecurity (01/06/2023)   Hunger Vital Sign    Worried About Running Out of Food in the Last Year: Never true    Ran Out of Food in the Last Year: Never true  Transportation Needs: No Transportation Needs (01/06/2023)   PRAPARE - Administrator, Civil Service (Medical): No    Lack of Transportation (Non-Medical): No  Physical Activity: Not on file  Stress: Stress Concern Present (12/27/2022)   [employer] of Occupational Health - Occupational Stress Questionnaire    Feeling of Stress : To some extent  Social Connections: Moderately Integrated (12/27/2022)   Social Connection and Isolation Panel [NHANES]    Frequency of Communication with Friends and Family: More than three times a week    Frequency of Social Gatherings with Friends and Family: More than three times a week    Attends Religious Services: More than 4 times per year    Active Member of [employer] or Organizations: Yes    Attends [employer]  or Organization Meetings: More than 4 times per year    Marital Status: Never married  Intimate Partner Violence: Not At Risk (01/06/2023)   Humiliation, Afraid, Rape, and Kick questionnaire    Fear of Current or Ex-Partner: No    Emotionally Abused: No    Physically Abused: No    Sexually Abused: No    FAMILY HISTORY: Family History  Problem Relation Age of Onset   Cancer Father        lung    ALLERGIES:  has No Known Allergies.  MEDICATIONS:  Current Outpatient Medications  Medication Sig Dispense Refill   amoxicillin-clavulanate (AUGMENTIN) 200-28.5 MG/5ML suspension Place 15 mLs (600 mg total) into feeding tube 2  (two) times daily. 150 mL 0   apixaban (ELIQUIS) 5 MG TABS tablet Place 1 tablet (5 mg total) into feeding tube 2 (two) times daily. 60 tablet 0   atorvastatin (LIPITOR) 80 MG tablet Place 1 tablet (80 mg total) into feeding tube daily. 30 tablet 0   clopidogrel (PLAVIX) 75 MG tablet Place 1 tablet (75 mg total) into feeding tube daily with breakfast. 30 tablet 0   empagliflozin (JARDIANCE) 10 MG TABS tablet Take 1 tablet (10 mg total) by mouth daily. 30 tablet 0   metoprolol tartrate (LOPRESSOR) 25 MG tablet Place 0.5 tablets (12.5 mg total) into feeding tube 2 (two) times daily. 60 tablet 0   Nutritional Supplements (FEEDING SUPPLEMENT, OSMOLITE 1.5 CAL,) LIQD Give 2 cartons at 8am, noon, 4pm feeding via feeding tube.  Give 1 carton at 8pm via feeding tube.  Flush with 60ml water before and after each feeding.  Flush with additional TID between feedings to better meet hydration needs. Send bolus supplies. 1659 mL 0   Protein (FEEDING SUPPLEMENT, PROSOURCE TF20,) liquid Place 60 mLs into feeding tube 2 (two) times daily.     Water For Irrigation, Sterile (FREE WATER) SOLN Place 200 mLs into feeding tube 4 (four) times daily - after meals and at bedtime.     oxyCODONE (OXY IR/ROXICODONE) 5 MG immediate release tablet Place 1 tablet (5 mg total) into feeding tube every 6 (six) hours as needed for severe pain. (Patient not taking: Reported on 02/03/2023) 30 tablet 0   No current facility-administered medications for this visit.     PHYSICAL EXAMINATION: ECOG PERFORMANCE STATUS: 1 - Symptomatic but completely ambulatory Vitals:   02/25/23 1031  BP: 105/82  Pulse: (!) 40  Temp: (!) 96.2 F (35.7 C)  SpO2: 99%   Filed Weights   02/25/23 1031  Weight: 175 lb (79.4 kg)    Physical Exam Constitutional:      General: He is not in acute distress. HENT:     Head: Normocephalic and atraumatic.     Mouth/Throat:     Comments: Restricted mouth opening/trismus Thrush Eyes:     General:  No scleral icterus. Cardiovascular:     Rate and Rhythm: Normal rate.     Pulses: Normal pulses.  Pulmonary:     Effort: Pulmonary effort is normal. No respiratory distress.     Breath sounds: No wheezing.  Abdominal:     General: There is no distension.     Palpations: Abdomen is soft.     Tenderness: There is no abdominal tenderness. There is no guarding.     Comments: PEG tube site- erythema and tenderness has resolved. Dressing changed. Site clean.    Musculoskeletal:        General: No swelling or deformity.  Lymphadenopathy:  Cervical: Cervical adenopathy present.  Skin:    General: Skin is warm and dry.  Neurological:     Mental Status: He is alert and oriented to person, place, and time.  Psychiatric:        Mood and Affect: Mood normal.        Behavior: Behavior normal.    LABORATORY DATA:  I have reviewed the data as listed     Latest Ref Rng & Units 02/25/2023   10:16 AM 02/16/2023    9:28 AM 02/03/2023    8:50 AM  CBC  WBC 4.0 - 10.5 K/uL 4.6  3.8  4.9   Hemoglobin 13.0 - 17.0 g/dL 9.6  9.2  40.9   Hematocrit 39.0 - 52.0 % 28.9  27.1  29.5   Platelets 150 - 400 K/uL 317  171  151       Latest Ref Rng & Units 02/25/2023   10:16 AM 02/16/2023    9:29 AM 02/03/2023    8:51 AM  CMP  Glucose 70 - 99 mg/dL 811  914  782   BUN 8 - 23 mg/dL 24  25  25    Creatinine 0.61 - 1.24 mg/dL 9.56  2.13  0.86   Sodium 135 - 145 mmol/L 136  136  137   Potassium 3.5 - 5.1 mmol/L 4.9  4.6  4.4   Chloride 98 - 111 mmol/L 98  99  99   CO2 22 - 32 mmol/L 30  29  30    Calcium 8.9 - 10.3 mg/dL 9.0  8.9  9.1   Total Protein 6.5 - 8.1 g/dL 6.3  6.3  6.4   Total Bilirubin 0.3 - 1.2 mg/dL 0.3  0.3  0.5   Alkaline Phos 38 - 126 U/L 65  74  75   AST 15 - 41 U/L 22  19  26    ALT 0 - 44 U/L 19  18  26      ASSESSMENT & PLAN:   Cancer Staging  Oropharyngeal cancer (HCC) Staging form: Oral Cavity, AJCC 8th Edition - Clinical: Stage IVA (cT3, cN2a, cM0) - Signed by [name], MD on  11/02/2022  Chemotherapy follow up- completed radiation 02/04/23. continues to recover from concurrent chemotherapy and radiation. IVF 500 cc today. Overall improving. May consider adding speech eval in future, [place]. Continue f/u with nutrition.  PEG- s/p antibiotics for possible cellutlits- symptoms improved.  Bradycardia- heart rate in 40s. Hx of frequent pvcs and ventricular bigeminy. On metoprolol 12.5 mg twice a day (decreased recently d/t hypotension). Contacted cardiology, Eula Listen, PA who recommended EKG and he was able to review results. NSR, 72 bpm. Rhythm similar to previous. Patient is asymptomatic and hemodynamically stable. He recommends continuing metoprolol 12.5 BID as prescribed. Cardiology office will reach out to patient for zio patch.   Disposition:  Follow up with Dr Cathie Hoops as scheduled.   All questions were answered. The patient knows to call the clinic with any problems, questions or concerns.  Consuello Masse, DNP, AGNP-C, AOCNP [facility] [phone] (clinic)

## 2023-02-28 ENCOUNTER — Ambulatory Visit: Payer: Medicare Other | Attending: Physician Assistant

## 2023-02-28 DIAGNOSIS — I493 Ventricular premature depolarization: Secondary | ICD-10-CM

## 2023-02-28 NOTE — Telephone Encounter (Signed)
Spoke with patient and reviewed provider recommendations to wear monitor for 3 days. He reports going out of town until Friday and advised that would be fine to place it on when he returns. Advised that it will come in the mail for him to wear and then return and instructions are included. He verbalized understanding. Instructed him to call back if he should have any further questions. No further concerns or needs now.

## 2023-02-28 NOTE — Addendum Note (Signed)
Addended by: Bryna Colander on: 02/28/2023 07:46 AM   Modules accepted: Orders

## 2023-03-02 ENCOUNTER — Ambulatory Visit: Payer: Medicare Other | Admitting: Speech Pathology

## 2023-03-04 DIAGNOSIS — I493 Ventricular premature depolarization: Secondary | ICD-10-CM | POA: Diagnosis not present

## 2023-03-07 ENCOUNTER — Inpatient Hospital Stay: Payer: Medicare Other

## 2023-03-07 ENCOUNTER — Encounter: Payer: Self-pay | Admitting: Oncology

## 2023-03-07 ENCOUNTER — Inpatient Hospital Stay (HOSPITAL_BASED_OUTPATIENT_CLINIC_OR_DEPARTMENT_OTHER): Payer: Medicare Other | Admitting: Oncology

## 2023-03-07 VITALS — BP 107/69 | HR 56 | Temp 97.8°F | Resp 18 | Wt 175.1 lb

## 2023-03-07 DIAGNOSIS — G893 Neoplasm related pain (acute) (chronic): Secondary | ICD-10-CM | POA: Diagnosis not present

## 2023-03-07 DIAGNOSIS — Z79899 Other long term (current) drug therapy: Secondary | ICD-10-CM | POA: Diagnosis not present

## 2023-03-07 DIAGNOSIS — Z931 Gastrostomy status: Secondary | ICD-10-CM | POA: Diagnosis not present

## 2023-03-07 DIAGNOSIS — C109 Malignant neoplasm of oropharynx, unspecified: Secondary | ICD-10-CM

## 2023-03-07 DIAGNOSIS — I513 Intracardiac thrombosis, not elsewhere classified: Secondary | ICD-10-CM

## 2023-03-07 DIAGNOSIS — E43 Unspecified severe protein-calorie malnutrition: Secondary | ICD-10-CM | POA: Diagnosis not present

## 2023-03-07 DIAGNOSIS — Z7901 Long term (current) use of anticoagulants: Secondary | ICD-10-CM | POA: Diagnosis not present

## 2023-03-07 DIAGNOSIS — F17220 Nicotine dependence, chewing tobacco, uncomplicated: Secondary | ICD-10-CM | POA: Diagnosis not present

## 2023-03-07 DIAGNOSIS — I4892 Unspecified atrial flutter: Secondary | ICD-10-CM | POA: Diagnosis not present

## 2023-03-07 DIAGNOSIS — R001 Bradycardia, unspecified: Secondary | ICD-10-CM | POA: Diagnosis not present

## 2023-03-07 DIAGNOSIS — Z923 Personal history of irradiation: Secondary | ICD-10-CM | POA: Diagnosis not present

## 2023-03-07 DIAGNOSIS — Z9221 Personal history of antineoplastic chemotherapy: Secondary | ICD-10-CM | POA: Diagnosis not present

## 2023-03-07 LAB — CMP (CANCER CENTER ONLY)
ALT: 19 U/L (ref 0–44)
AST: 23 U/L (ref 15–41)
Albumin: 3.7 g/dL (ref 3.5–5.0)
Alkaline Phosphatase: 65 U/L (ref 38–126)
Anion gap: 8 (ref 5–15)
BUN: 33 mg/dL — ABNORMAL HIGH (ref 8–23)
CO2: 28 mmol/L (ref 22–32)
Calcium: 9.1 mg/dL (ref 8.9–10.3)
Chloride: 100 mmol/L (ref 98–111)
Creatinine: 0.88 mg/dL (ref 0.61–1.24)
GFR, Estimated: >60 mL/min (ref >60)
Glucose, Bld: 235 mg/dL — ABNORMAL HIGH (ref 70–99)
Potassium: 4.6 mmol/L (ref 3.5–5.1)
Sodium: 136 mmol/L (ref 135–145)
Total Bilirubin: 0.5 mg/dL (ref 0.3–1.2)
Total Protein: 7 g/dL (ref 6.5–8.1)

## 2023-03-07 LAB — CBC WITH DIFFERENTIAL (CANCER CENTER ONLY)
Abs Immature Granulocytes: 0.03 10*3/uL (ref 0.00–0.07)
Basophils Absolute: 0.1 10*3/uL (ref 0.0–0.1)
Basophils Relative: 1 %
Eosinophils Absolute: 0 10*3/uL (ref 0.0–0.5)
Eosinophils Relative: 0 %
HCT: 30.8 % — ABNORMAL LOW (ref 39.0–52.0)
Hemoglobin: 10.2 g/dL — ABNORMAL LOW (ref 13.0–17.0)
Immature Granulocytes: 0 %
Lymphocytes Relative: 7 %
Lymphs Abs: 0.7 10*3/uL (ref 0.7–4.0)
MCH: 37.4 pg — ABNORMAL HIGH (ref 26.0–34.0)
MCHC: 33.1 g/dL (ref 30.0–36.0)
MCV: 112.8 fL — ABNORMAL HIGH (ref 80.0–100.0)
Monocytes Absolute: 1.5 10*3/uL — ABNORMAL HIGH (ref 0.1–1.0)
Monocytes Relative: 16 %
Neutro Abs: 7 10*3/uL (ref 1.7–7.7)
Neutrophils Relative %: 76 %
Platelet Count: 284 10*3/uL (ref 150–400)
RBC: 2.73 MIL/uL — ABNORMAL LOW (ref 4.22–5.81)
RDW: 19.5 % — ABNORMAL HIGH (ref 11.5–15.5)
WBC Count: 9.2 10*3/uL (ref 4.0–10.5)
nRBC: 0 % (ref 0.0–0.2)

## 2023-03-07 LAB — MAGNESIUM: Magnesium: 2.1 mg/dL (ref 1.7–2.4)

## 2023-03-07 NOTE — Assessment & Plan Note (Signed)
oxycodone 5mg Q6h PRN pain.   

## 2023-03-07 NOTE — Progress Notes (Signed)
Pt here for follow up. No new concerns voiced. Peg tube dressing change done today. Site intact, no redness or drainage noted.

## 2023-03-07 NOTE — Assessment & Plan Note (Signed)
 stage IVA oropharyngeal squamous cell carcinoma, Patient is on concurrent chemotherapy with Radiation.  Labs are reviewed and discussed with patient. Proceed with cisplatin 40 mg/m2 He is finishing chemotherapy and radiation.  Plan PET restaging in August. .

## 2023-03-07 NOTE — Assessment & Plan Note (Signed)
LV thrombus and Atrial Flutter,ejection fraction 35 to 40%,  Continue Eliquis 5mg BID 

## 2023-03-07 NOTE — Assessment & Plan Note (Addendum)
Patient can not care for his PEG tube Dressing was changed here in the clinic.  Home care only provides education but he can not do the change by himself.  He will return to radiation oncology  clinic 2-3 times per week for dressing change.

## 2023-03-07 NOTE — Assessment & Plan Note (Addendum)
On Tube Feeding. Follow up with nutritionist. Weight is stable.  Encourage patient to increase free water flush to 36 oz divided in 3-4 times.

## 2023-03-07 NOTE — Progress Notes (Signed)
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  Labs are reviewed and discussed with patient. Proceed with cisplatin 40 mg/m2 He is finishing chemotherapy and radiation.  Plan PET restaging in August. .   Neoplasm related pain  oxycodone 5mg  Q6h PRN pain.    Protein-calorie malnutrition, severe (HCC) On Tube Feeding. Follow up with nutritionist. Weight is stable.  Encourage patient to increase free water flush to 36 oz divided in 3-4 times.    LV (left ventricular) mural thrombus LV thrombus and Atrial Flutter,ejection fraction 35 to 40%,  Continue Eliquis 5mg  BID  S/P percutaneous endoscopic gastrostomy (PEG) tube placement [facility]) Patient can not care for his PEG tube Dressing was changed here in the clinic.  Home care only provides education but he can not do the change by himself.  He will return to radiation oncology  clinic 2-3 times per week for dressing change.      Orders Placed This Encounter  Procedures   CBC with Differential (Cancer Center Only)    Standing Status:   Future    Standing Expiration Date:   03/06/2024   CMP (Cancer Center only)    Standing Status:   Future    Standing Expiration Date:   03/06/2024    Follow-up 1 week lab NP 2 weeks lab MD. .  All questions were answered. The patient knows to call the clinic with any problems, questions or concerns.  [name], MD, PhD [facility] Hematology Oncology 03/07/2023   HISTORY OF PRESENTING ILLNESS:  Patient presented outpatient ultrasound of his left lower extremity  showing positive for DVT. Denies any recent,, surgery, travel or issue.  Denies any family history of DVT.  Reports that he developed left lower extremity swelling for a week, associated with moderate severity throbbing aching discomfort feels like pulling a muscle.  Denies any shortness of breath. Patient was started on Xarelto starting kit and advised to follow-up with heme-onc.  INTERVAL HISTORY Kenneth Gilmore is a 72 y.o. male presents to re-establish care for oropharyngeal squamous cell carcinoma  Oncology History  Oropharyngeal cancer (HCC)  09/28/2022 Imaging   CT soft tissue neck with contrast showed infiltrating mass in the right tongue with multiple ipsilateral nodal metastases. The primary mass measures at least 4 cm and infiltrates parapharyngeal fat, visualization of tumor extent is limited by artifact from dental amalgam.   10/28/2022 Initial Diagnosis   Squamous cell carcinoma of tongue (HCC) Patient has throat pain and was evaluated by ENT Dr.Bennett.  Physical examination showed cervical lymphadenopathy.  10/15/2022 ultrasound-guided biopsy of right cervical lymph node showed fibrous tissue, detach minute salivary gland tissue.  Granular debris's and a few dysplastic squamous epithelial cells, nondiagnostic.  10/22/2022 repeat ultrasound-guided biopsy of right cervical lymph node was positive for malignancy, metastatic squamous cell carcinoma, keratinizing, in a background of dense fibrosis and tumor necrosis.   10/28/2022 Cancer Staging   Staging form: Oral Cavity, AJCC 8th Edition - Clinical: Stage IVA (cT3, cN2a, cM0) - Signed by [name], MD on 11/02/2022   11/16/2022 Imaging   PET scan showed 1. Large infiltrating right oropharyngeal mass  is markedly hypermetabolic and consistent with known squamous cell carcinoma. 2. Bilateral hypermetabolic metastatic cervical lymphadenopathy. 3. No evidence of metastatic disease involving the chest, abdomen/pelvis or bony structures.      12/09/2022 -  Chemotherapy   Patient is on Treatment Plan : HEAD/NECK Cisplatin (40) q7d     01/03/2023 - 01/11/2023 Hospital Admission   Kenneth Gilmore is a 72 year old male with non-insulin-dependent diabetes mellitus, hyperlipidemia, history of DVT on Xarelto, oropharyngeal squamous cell carcinoma stage IVa, who presents emergency department from oncology clinic for chief concerns of syncope event at home while he was in the shower. High sensitive troponin is 2925.  Telemetry showed atrial flutter, he was initially given diltiazem gtt., and quickly converted to sinus. Patient also had a significant dysphagia from oropharyngeal cancer, G tube is placed on 5/17, TF started.  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  2 drug-eluting stents were placed.  Patient is placed on Plavix.     01/07/2023 Procedure   S/p gastrostomy tube.  placement.  5/22 G tube tip was broken and Luer-Lok tip was replaced in ER    INTERVAL HISTORY Kenneth Gilmore is a 72 y.o. male who has above history reviewed by me today presents for follow up visit for P16 + oropharyngeal squamous cell carcinoma S/p  concurrent chemotherapy with radiation.  Today he reports feeling better. Still has fatigue He tolerates tube feeding.  Denies nausea vomiting, diarrhea, abdominal pain.  + one episode of ''passout" on 7/11, during his trip at the beach with his sisters. He denies hitting his head. He did not seek medical advise after the event. Denies any new weakness, headache, confusion.   Review of Systems  Constitutional:  Positive for malaise/fatigue. Negative for chills, fever and weight loss.  HENT:  Negative for sore throat.   Eyes:  Negative for redness.  Respiratory:  Negative for cough, shortness of breath and wheezing.    Cardiovascular:  Negative for chest pain and palpitations.  Gastrointestinal:  Negative for abdominal pain, blood in stool, nausea and vomiting.  Genitourinary:  Negative for dysuria.  Musculoskeletal:  Negative for myalgias.  Skin:  Negative for rash.  Neurological:  Negative for dizziness, tingling and tremors.  Endo/Heme/Allergies:  Does not bruise/bleed easily.  Psychiatric/Behavioral:  Negative for hallucinations.     MEDICAL HISTORY:  Past Medical History:  Diagnosis Date   Diabetes mellitus without complication (HCC)    Hyperlipidemia    Hypertension    Oropharyngeal cancer (HCC)     SURGICAL HISTORY: Past Surgical History:  Procedure Laterality Date   arm surgery Left    CORONARY ANGIOPLASTY WITH STENT PLACEMENT     CORONARY STENT INTERVENTION N/A 01/10/2023   Procedure: CORONARY STENT INTERVENTION;  Surgeon: Iran Ouch, MD;  Location: [facility] INVASIVE CV LAB;  Service: Cardiovascular;  Laterality: N/A;   IR GASTROSTOMY TUBE MOD SED  01/07/2023   left side rotator cuff surgery     RIGHT/LEFT HEART CATH AND CORONARY ANGIOGRAPHY N/A 01/10/2023   Procedure: RIGHT/LEFT HEART CATH AND CORONARY ANGIOGRAPHY;  Surgeon: Iran Ouch, MD;  Location: [facility] INVASIVE CV LAB;  Service: Cardiovascular;  Laterality: N/A;   SHOULDER SURGERY      SOCIAL HISTORY: Social History   Socioeconomic History   Marital status: Single    Spouse name: Not on file  Number of children: Not on file   Years of education: Not on file   Highest education level: Not on file  Occupational History   Occupation: retired  Tobacco Use   Smoking status: Never   Smokeless tobacco: Current    Types: Chew  Substance and Sexual Activity   Alcohol use: No   Drug use: Never   Sexual activity: Not Currently  Other Topics Concern   Not on file  Social History Narrative   Not on file   Social Determinants of Health   Financial Resource Strain: Low Risk  (12/27/2022)   Overall Financial  Resource Strain (CARDIA)    Difficulty of Paying Living Expenses: Not hard at all  Food Insecurity: No Food Insecurity (01/06/2023)   Hunger Vital Sign    Worried About Running Out of Food in the Last Year: Never true    Ran Out of Food in the Last Year: Never true  Transportation Needs: No Transportation Needs (01/06/2023)   PRAPARE - Administrator, Civil Service (Medical): No    Lack of Transportation (Non-Medical): No  Physical Activity: Not on file  Stress: Stress Concern Present (12/27/2022)   [employer] of Occupational Health - Occupational Stress Questionnaire    Feeling of Stress : To some extent  Social Connections: Moderately Integrated (12/27/2022)   Social Connection and Isolation Panel [NHANES]    Frequency of Communication with Friends and Family: More than three times a week    Frequency of Social Gatherings with Friends and Family: More than three times a week    Attends Religious Services: More than 4 times per year    Active Member of [employer] or Organizations: Yes    Attends Banker Meetings: More than 4 times per year    Marital Status: Never married  Intimate Partner Violence: Not At Risk (01/06/2023)   Humiliation, Afraid, Rape, and Kick questionnaire    Fear of Current or Ex-Partner: No    Emotionally Abused: No    Physically Abused: No    Sexually Abused: No    FAMILY HISTORY: Family History  Problem Relation Age of Onset   Cancer Father        lung    ALLERGIES:  has No Known Allergies.  MEDICATIONS:  Current Outpatient Medications  Medication Sig Dispense Refill   apixaban (ELIQUIS) 5 MG TABS tablet Place 1 tablet (5 mg total) into feeding tube 2 (two) times daily. 60 tablet 0   atorvastatin (LIPITOR) 80 MG tablet Place 1 tablet (80 mg total) into feeding tube daily. 30 tablet 0   clopidogrel (PLAVIX) 75 MG tablet Place 1 tablet (75 mg total) into feeding tube daily with breakfast. 30 tablet 0   empagliflozin (JARDIANCE) 10  MG TABS tablet Take 1 tablet (10 mg total) by mouth daily. 30 tablet 0   metoprolol tartrate (LOPRESSOR) 25 MG tablet Place 0.5 tablets (12.5 mg total) into feeding tube 2 (two) times daily. 60 tablet 0   Nutritional Supplements (FEEDING SUPPLEMENT, OSMOLITE 1.5 CAL,) LIQD Give 2 cartons at 8am, noon, 4pm feeding via feeding tube.  Give 1 carton at 8pm via feeding tube.  Flush with 60ml water before and after each feeding.  Flush with additional TID between feedings to better meet hydration needs. Send bolus supplies. 1659 mL 0   oxyCODONE (OXY IR/ROXICODONE) 5 MG immediate release tablet Place 1 tablet (5 mg total) into feeding tube every 6 (six) hours as needed for severe pain. 30 tablet  0   Protein (FEEDING SUPPLEMENT, PROSOURCE TF20,) liquid Place 60 mLs into feeding tube 2 (two) times daily.     Water For Irrigation, Sterile (FREE WATER) SOLN Place 200 mLs into feeding tube 4 (four) times daily - after meals and at bedtime.     amoxicillin-clavulanate (AUGMENTIN) 200-28.5 MG/5ML suspension Place 15 mLs (600 mg total) into feeding tube 2 (two) times daily. 150 mL 0   No current facility-administered medications for this visit.     PHYSICAL EXAMINATION: ECOG PERFORMANCE STATUS: 1 - Symptomatic but completely ambulatory Vitals:   03/07/23 1002  BP: 107/69  Pulse: (!) 56  Resp: 18  Temp: 97.8 F (36.6 C)   Filed Weights   03/07/23 1002  Weight: 175 lb 1.6 oz (79.4 kg)    Physical Exam Constitutional:      General: He is not in acute distress. HENT:     Head: Normocephalic and atraumatic.     Mouth/Throat:     Comments: Restricted mouth opening/trismus Thrush Eyes:     General: No scleral icterus. Cardiovascular:     Rate and Rhythm: Normal rate.  Pulmonary:     Effort: Pulmonary effort is normal. No respiratory distress.     Breath sounds: Normal breath sounds. No wheezing.  Abdominal:     General: Bowel sounds are normal. There is no distension.     Palpations:  Abdomen is soft.     Comments: PEG tube site tenderness with erythematous changes  Musculoskeletal:        General: No swelling. Normal range of motion.     Cervical back: Normal range of motion and neck supple.  Skin:    General: Skin is warm and dry.     Findings: No erythema or rash.  Neurological:     Mental Status: He is alert and oriented to person, place, and time. Mental status is at baseline.     Cranial Nerves: No cranial nerve deficit.     Coordination: Coordination normal.  Psychiatric:        Mood and Affect: Mood normal.      LABORATORY DATA:  I have reviewed the data as listed     Latest Ref Rng & Units 03/07/2023    9:37 AM 02/25/2023   10:16 AM 02/16/2023    9:28 AM  CBC  WBC 4.0 - 10.5 K/uL 9.2  4.6  3.8   Hemoglobin 13.0 - 17.0 g/dL 52.8  9.6  9.2   Hematocrit 39.0 - 52.0 % 30.8  28.9  27.1   Platelets 150 - 400 K/uL 284  317  171       Latest Ref Rng & Units 03/07/2023    9:37 AM 02/25/2023   10:16 AM 02/16/2023    9:29 AM  CMP  Glucose 70 - 99 mg/dL 413  244  010   BUN 8 - 23 mg/dL 33  24  25   Creatinine 0.61 - 1.24 mg/dL 2.72  5.36  6.44   Sodium 135 - 145 mmol/L 136  136  136   Potassium 3.5 - 5.1 mmol/L 4.6  4.9  4.6   Chloride 98 - 111 mmol/L 100  98  99   CO2 22 - 32 mmol/L 28  30  29    Calcium 8.9 - 10.3 mg/dL 9.1  9.0  8.9   Total Protein 6.5 - 8.1 g/dL 7.0  6.3  6.3   Total Bilirubin 0.3 - 1.2 mg/dL 0.5  0.3  0.3   Alkaline  Phos 38 - 126 U/L 65  65  74   AST 15 - 41 U/L 23  22  19    ALT 0 - 44 U/L 19  19  18

## 2023-03-08 ENCOUNTER — Ambulatory Visit: Payer: Medicare Other | Attending: Radiation Oncology | Admitting: Speech Pathology

## 2023-03-08 DIAGNOSIS — R1312 Dysphagia, oropharyngeal phase: Secondary | ICD-10-CM

## 2023-03-08 DIAGNOSIS — C109 Malignant neoplasm of oropharynx, unspecified: Secondary | ICD-10-CM | POA: Diagnosis not present

## 2023-03-08 DIAGNOSIS — R252 Cramp and spasm: Secondary | ICD-10-CM | POA: Diagnosis not present

## 2023-03-08 DIAGNOSIS — R471 Dysarthria and anarthria: Secondary | ICD-10-CM | POA: Insufficient documentation

## 2023-03-08 NOTE — Therapy (Signed)
OUTPATIENT SPEECH LANGUAGE PATHOLOGY  TREATMENT NOTE   Patient Name: Kenneth Gilmore MRN: [id number] DOB:1951-02-10, 72 y.o., male Today's Date: 03/08/2023  PCP: [facility] [place] REFERRING PROVIDER: Laurette Schimke, NP  END OF SESSION:  End of Session - 03/08/23 0917     Visit Number 6    Number of Visits 32    Date for SLP Re-Evaluation 05/03/23    Authorization Type [employer]    Progress Note Due on Visit 10    SLP Start Time 0920    SLP Stop Time  0950    SLP Time Calculation (min) 30 min    Activity Tolerance Patient tolerated treatment well             Past Medical History:  Diagnosis Date   Diabetes mellitus without complication (HCC)    Hyperlipidemia    Hypertension    Oropharyngeal cancer (HCC)    Past Surgical History:  Procedure Laterality Date   arm surgery Left    CORONARY ANGIOPLASTY WITH STENT PLACEMENT     CORONARY STENT INTERVENTION N/A 01/10/2023   Procedure: CORONARY STENT INTERVENTION;  Surgeon: Iran Ouch, MD;  Location: [facility] INVASIVE CV LAB;  Service: Cardiovascular;  Laterality: N/A;   IR GASTROSTOMY TUBE MOD SED  01/07/2023   left side rotator cuff surgery     RIGHT/LEFT HEART CATH AND CORONARY ANGIOGRAPHY N/A 01/10/2023   Procedure: RIGHT/LEFT HEART CATH AND CORONARY ANGIOGRAPHY;  Surgeon: Iran Ouch, MD;  Location: [facility] INVASIVE CV LAB;  Service: Cardiovascular;  Laterality: N/A;   SHOULDER SURGERY     Patient Active Problem List   Diagnosis Date Noted   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) 01/25/2023   Essential tremor 01/25/2023   Pure hypercholesterolemia, unspecified 01/25/2023   Benign hypertension 01/25/2023   Type 2 diabetes mellitus (HCC) 01/25/2023   Long term (current) use of anticoagulants 01/25/2023   Exposure to potentially hazardous substance 01/25/2023   Atherosclerosis of coronary artery without angina pectoris 01/25/2023   Chronic ischemic heart disease 01/25/2023   S/P  percutaneous endoscopic gastrostomy (PEG) tube placement (HCC) 01/13/2023   CAD, multiple vessel 01/13/2023   Pancytopenia (HCC) 01/07/2023   Poor nutrition 01/06/2023   Dilated cardiomyopathy (HCC) 01/06/2023   Hypomagnesemia 01/05/2023   Hypokalemia 01/05/2023   LV (left ventricular) mural thrombus 01/05/2023   Chronic systolic heart failure (HCC) 01/05/2023   Thrombocythemia 01/05/2023   Palliative care encounter 01/04/2023   NSTEMI (non-ST elevated myocardial infarction) (HCC) 01/03/2023   Diabetes mellitus type 2, noninsulin dependent (HCC) 01/03/2023   Protein-calorie malnutrition, severe (HCC) 01/03/2023   Benign essential tremor 01/03/2023   Atrial flutter (HCC) 01/03/2023   Hypotension 01/03/2023   Syncope and collapse 01/03/2023   Trismus 12/23/2022   Dysphagia 12/16/2022   Thrush 12/16/2022   Encounter for antineoplastic chemotherapy 12/09/2022   Neoplasm related pain 12/09/2022   Goals of care, counseling/discussion 11/02/2022   History of DVT (deep vein thrombosis) 11/02/2022   Oropharyngeal cancer (HCC) 10/28/2022   Hyperlipidemia 04/01/2014   Systemic primary arterial hypertension 04/01/2014    ONSET DATE: 10/28/2022 date of diagnosis;  12/14/2022 date of referral  REFERRING DIAG: C10.9 (ICD-10-CM) - Oropharyngeal cancer (HCC)   THERAPY DIAG:  Trismus  Dysphagia, oropharyngeal phase  Oropharyngeal cancer (HCC)  Rationale for Evaluation and Treatment: Rehabilitation  SUBJECTIVE:   PERTINENT HISTORY:  Pt is a 72 year old male with past medical history of DVT who is currently undergoing concurrent chemoradiation treatment for at least stage  IVA oropharyngeal squamous cell carcinoma.   DIAGNOSTIC FINDINGS:  CT Soft tissue neck - 09/30/2022 Infiltrating mass in the right tongue measuring  up to 4 cm. Prominent submucosal involvement including in the right  parapharyngeal fat. Assessment of relationship to the pterygoids and  extrinsic tongue musculature  is somewhat hindered by streak artifact  from dental amalgam.   US biopsy - 10/22/2022 Lymph node, right cervical: Positive for malignancy; Metastatic Squamous Cell Carcinoma, Keratinizing, in a background of dense fibrosis and tumor necrosis An immunohistochemical study directed against p16 is strongly and  diffusely positive, indicating an HPV driven malignancy.   PAIN:  Are you having pain? Yes: NPRS scale: 8/10 Pain location: right tongue and jaw Pain description: pt with difficulty describing Aggravating factors: none Relieving factors: heat  FALLS: Has patient fallen in last 6 months?  Yes, while at beach  LIVING ENVIRONMENT: Lives with: lives alone Lives in: House/apartment  PLOF:  Level of assistance: Independent with ADLs, Independent with IADLs Employment: Retired  PATIENT GOALS: "I want to eat solid foods again"  SUBJECTIVE STATEMENT: Pt appears overall much improved Pt accompanied by: self  OBJECTIVE:   TODAY'S TREATMENT Skilled treatment session focused on pt's dysphagia, speech intelligibility and trismus goals. SLP facilitated session by providing the following interventions:  Pt very talkative compared to previous sessions, while he had a fall and is sore while at the beach, pt's coloring, strength appears so much more improved, pt has gained weight thru PEG feeding  Water Protocol - pt instructed to brush his teeth and consume ice chips to aid in perseveration of swallow musculature and to thin oral secretions - he reports this has been going well  Trismus - pt with noted improved speech intelligibility and lingual ROM. Measurement taken with stable MIO of 25mm -   SLP further facilitated session by providing Mod A faded to Min A for active and passive mandibular ROM exercises using yellow theraband and ARK-J device. Pt able to return demonstration.   PATIENT EDUCATION: Education details: see above Person educated: Patient Education method:  Explanation Education comprehension: verbalized understanding and needs further education  Research states the risk for dysphagia increases due to radiation and/or chemotherapy treatment due to a variety of factors, so SLP educated the pt about the possibility of reduced/limited ability for PO intake during rad tx. SLP also educated pt regarding possible changes to swallowing musculature after rad tx, and why adherence to dysphagia HEP provided today and PO consumption was necessary to inhibit muscle fibrosis following rad tx and to mitigate muscle disuse atrophy. SLP informed pt why this would be detrimental to their swallowing status and to their pulmonary health. Pt demonstrated understanding of these things to SLP. SLP encouraged pt to safely eat and drink as deep into their radiation/chemotherapy as possible to provide the best possible long-term swallowing outcome for pt.    GOALS: Goals reviewed with patient? Yes  SHORT TERM GOALS: Target date: 10 sessions  Patient will participate in objective swallowing evaluation (MBSS) to identify safest diet recommendation as well as therapeutic targets. Baseline: Goal status: INITIAL  2.  The patient will achieve 90% intelligibility with use of compensatory speech intelligibility strategies while reading aloud "The Grandfather Passage" and "The Rainbow Passage" to an unfamiliar listener.  Baseline:  Goal status: INITIAL  3.  Pt will demonstrate a return to full cervical ROM and function post operatively compared to baselines and not demonstrate any signs or symptoms of lymphedema.  Baseline:  Goal status: INITIAL  LONG TERM GOALS: Target date: 02/09/2023  Pt will be able to verbalize understanding of a home exercise program for pharyngeal, trismus and cervical range of motion.  Baseline:  Goal status: INITIAL  2.  Patient will improve perception of swallowing as indicated by an improvement in EAT-10 score to 5 (Baseline = 10) by 12 weeks  from initial swallowing therapy session. Baseline:  Goal status: INITIAL  3.  The patient will increase his maximal jaw opening by 10 mm to achieve enhanced speech intelligibility as evidenced by an improved score on a standardized dysarthria assessment.  Baseline:  Goal status: INITIAL  ASSESSMENT:  CLINICAL IMPRESSION: Pt is a 72 year old male who was seen today for a swallowing treatment d/t oropharyngeal cancer resulting in moderate trismus and severe oropharyngeal dysphagia. See above treatment note for details.   Data indicate that pt's swallow ability will likely decrease over the course of radiation/chemoradiation therapy and could very well decline over time following the conclusion of that therapy due to muscle disuse atrophy and/or muscle fibrosis. Pt will continue to need to be seen by SLP in order to assess safety of PO intake, assess the need for recommending any objective swallow assessment, and ensuring pt is correctly completing the individualized HEP.   OBJECTIVE IMPAIRMENTS: include dysarthria, dysphagia, and trismus. These impairments are limiting patient from effectively communicating at home and in community and safety when swallowing. Factors affecting potential to achieve goals and functional outcome are medical prognosis and poor health literacy. Patient will benefit from skilled SLP services to address above impairments and improve overall function.  REHAB POTENTIAL: Good  PLAN:  SLP FREQUENCY: 1-2x/week  SLP DURATION: 8 weeks  PLANNED INTERVENTIONS: Aspiration precaution training, Pharyngeal strengthening exercises, Diet toleration management , Trials of upgraded texture/liquids, SLP instruction and feedback, Compensatory strategies, and Patient/family education   Keylor Rands B. Dreama Saa, M.S., CCC-SLP, Tree surgeon Certified Brain Injury Specialist [facility]  [facility] Services Office  [phone] Ascom [phone] Fax [phone]

## 2023-03-11 DIAGNOSIS — H2513 Age-related nuclear cataract, bilateral: Secondary | ICD-10-CM | POA: Diagnosis not present

## 2023-03-11 DIAGNOSIS — H5203 Hypermetropia, bilateral: Secondary | ICD-10-CM | POA: Diagnosis not present

## 2023-03-11 DIAGNOSIS — E119 Type 2 diabetes mellitus without complications: Secondary | ICD-10-CM | POA: Diagnosis not present

## 2023-03-14 DIAGNOSIS — C109 Malignant neoplasm of oropharynx, unspecified: Secondary | ICD-10-CM | POA: Diagnosis not present

## 2023-03-14 DIAGNOSIS — R1310 Dysphagia, unspecified: Secondary | ICD-10-CM | POA: Diagnosis not present

## 2023-03-14 DIAGNOSIS — R252 Cramp and spasm: Secondary | ICD-10-CM | POA: Diagnosis not present

## 2023-03-14 DIAGNOSIS — Z931 Gastrostomy status: Secondary | ICD-10-CM | POA: Diagnosis not present

## 2023-03-14 DIAGNOSIS — C770 Secondary and unspecified malignant neoplasm of lymph nodes of head, face and neck: Secondary | ICD-10-CM | POA: Diagnosis not present

## 2023-03-15 ENCOUNTER — Ambulatory Visit: Payer: Medicare Other | Admitting: Speech Pathology

## 2023-03-15 DIAGNOSIS — C109 Malignant neoplasm of oropharynx, unspecified: Secondary | ICD-10-CM | POA: Diagnosis not present

## 2023-03-15 DIAGNOSIS — R252 Cramp and spasm: Secondary | ICD-10-CM | POA: Diagnosis not present

## 2023-03-15 DIAGNOSIS — R471 Dysarthria and anarthria: Secondary | ICD-10-CM | POA: Diagnosis not present

## 2023-03-15 DIAGNOSIS — R1312 Dysphagia, oropharyngeal phase: Secondary | ICD-10-CM

## 2023-03-15 NOTE — Therapy (Signed)
OUTPATIENT SPEECH LANGUAGE PATHOLOGY  TREATMENT NOTE   Patient Name: Kenneth Gilmore MRN: [id number] DOB:04/16/1951, 72 y.o., male Today's Date: 03/15/2023  PCP: [facility], [place] REFERRING PROVIDER: Laurette Schimke, NP  END OF SESSION:  End of Session - 03/15/23 1015     Visit Number 7    Number of Visits 32    Date for SLP Re-Evaluation 05/03/23    Authorization Type [employer]    Progress Note Due on Visit 10    SLP Start Time 1015    SLP Stop Time  1100    SLP Time Calculation (min) 45 min    Activity Tolerance Patient tolerated treatment well             Past Medical History:  Diagnosis Date   Diabetes mellitus without complication (HCC)    Hyperlipidemia    Hypertension    Oropharyngeal cancer (HCC)    Past Surgical History:  Procedure Laterality Date   arm surgery Left    CORONARY ANGIOPLASTY WITH STENT PLACEMENT     CORONARY STENT INTERVENTION N/A 01/10/2023   Procedure: CORONARY STENT INTERVENTION;  Surgeon: Iran Ouch, MD;  Location: [facility] INVASIVE CV [facility];  Service: Cardiovascular;  Laterality: N/A;   IR GASTROSTOMY TUBE MOD SED  01/07/2023   left side rotator cuff surgery     RIGHT/LEFT HEART CATH AND CORONARY ANGIOGRAPHY N/A 01/10/2023   Procedure: RIGHT/LEFT HEART CATH AND CORONARY ANGIOGRAPHY;  Surgeon: Iran Ouch, MD;  Location: [facility] INVASIVE CV [facility];  Service: Cardiovascular;  Laterality: N/A;   SHOULDER SURGERY     Patient Active Problem List   Diagnosis Date Noted   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) 01/25/2023   Essential tremor 01/25/2023   Pure hypercholesterolemia, unspecified 01/25/2023   Benign hypertension 01/25/2023   Type 2 diabetes mellitus (HCC) 01/25/2023   Long term (current) use of anticoagulants 01/25/2023   Exposure to potentially hazardous substance 01/25/2023   Atherosclerosis of coronary artery without angina pectoris 01/25/2023   Chronic ischemic heart disease 01/25/2023   S/P  percutaneous endoscopic gastrostomy (PEG) tube placement (HCC) 01/13/2023   CAD, multiple vessel 01/13/2023   Pancytopenia (HCC) 01/07/2023   Poor nutrition 01/06/2023   Dilated cardiomyopathy (HCC) 01/06/2023   Hypomagnesemia 01/05/2023   Hypokalemia 01/05/2023   LV (left ventricular) mural thrombus 01/05/2023   Chronic systolic heart failure (HCC) 01/05/2023   Thrombocythemia 01/05/2023   Palliative care encounter 01/04/2023   NSTEMI (non-ST elevated myocardial infarction) (HCC) 01/03/2023   Diabetes mellitus type 2, noninsulin dependent (HCC) 01/03/2023   Protein-calorie malnutrition, severe (HCC) 01/03/2023   Benign essential tremor 01/03/2023   Atrial flutter (HCC) 01/03/2023   Hypotension 01/03/2023   Syncope and collapse 01/03/2023   Trismus 12/23/2022   Dysphagia 12/16/2022   Thrush 12/16/2022   Encounter for antineoplastic chemotherapy 12/09/2022   Neoplasm related pain 12/09/2022   Goals of care, counseling/discussion 11/02/2022   History of DVT (deep vein thrombosis) 11/02/2022   Oropharyngeal cancer (HCC) 10/28/2022   Hyperlipidemia 04/01/2014   Systemic primary arterial hypertension 04/01/2014    ONSET DATE: 10/28/2022 date of diagnosis;  12/14/2022 date of referral  REFERRING DIAG: C10.9 (ICD-10-CM) - Oropharyngeal cancer (HCC)   THERAPY DIAG:  Dysphagia, oropharyngeal phase  Dysarthria and anarthria  Oropharyngeal cancer (HCC)  Trismus  Rationale for Evaluation and Treatment: Rehabilitation  SUBJECTIVE:   PERTINENT HISTORY:  Pt is a 72 year old male with past medical history of DVT who is currently undergoing concurrent chemoradiation treatment  for at 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   DIAGNOSTIC FINDINGS:  CT Soft tissue neck - 09/30/2022 Infiltrating mass in the right tongue measuring  up to 4 cm. Prominent submucosal involvement including in the right  parapharyngeal fat. Assessment of relationship to the pterygoids and   extrinsic tongue musculature is somewhat hindered by streak artifact  from dental amalgam.   US biopsy - 10/22/2022 Lymph node, right cervical: Positive for malignancy; Metastatic Squamous Cell Carcinoma, Keratinizing, in a background of dense fibrosis and tumor necrosis An immunohistochemical study directed against p16 is strongly and  diffusely positive, indicating an HPV driven malignancy.   PAIN:  Are you having pain? Yes: NPRS scale: 8/10 Pain location: right tongue and jaw Pain description: pt with difficulty describing Aggravating factors: none Relieving factors: heat  FALLS: Has patient fallen in last 6 months?  Yes, while at beach  LIVING ENVIRONMENT: Lives with: lives alone Lives in: House/apartment  PLOF:  Level of assistance: Independent with ADLs, Independent with IADLs Employment: Retired  PATIENT GOALS: "I want to eat solid foods again"  SUBJECTIVE STATEMENT: Pt much more talkative today, even joking with this writer  Pt accompanied by: self  OBJECTIVE:   TODAY'S TREATMENT Skilled treatment session focused on pt's dysphagia, speech intelligibility and trismus goals. SLP facilitated session by providing the following interventions:  Skilled observation of pt consuming thin water via cup was provided. Pt with intermittent to consistent s/s of aspiration (throat clearing with swallow). Pt with documented silent aspiration of thin liquids.   SLP further facilitated session by providing skilled example both visual, verbal and written on [employer]. Pt able to complete with Mod I x 8 reps.  ARK-J device created with increased resistance. Pt with increased report of stretch using new device. Pt able to perform 8 repetitions of holding for 8 seconds allowing for passive ROM. Pt also able to engage in active ROM by depressing ARK-J device.   PATIENT EDUCATION: Education details: see above Person educated: Patient Education method: Explanation Education comprehension:  verbalized understanding and needs further education  Research states the risk for dysphagia increases due to radiation and/or chemotherapy treatment due to a variety of factors, so SLP educated the pt about the possibility of reduced/limited ability for PO intake during rad tx. SLP also educated pt regarding possible changes to swallowing musculature after rad tx, and why adherence to dysphagia HEP provided today and PO consumption was necessary to inhibit muscle fibrosis following rad tx and to mitigate muscle disuse atrophy. SLP informed pt why this would be detrimental to their swallowing status and to their pulmonary health. Pt demonstrated understanding of these things to SLP. SLP encouraged pt to safely eat and drink as deep into their radiation/chemotherapy as possible to provide the best possible long-term swallowing outcome for pt.    GOALS: Goals reviewed with patient? Yes  SHORT TERM GOALS: Target date: 10 sessions  Patient will participate in objective swallowing evaluation (MBSS) to identify safest diet recommendation as well as therapeutic targets. Baseline: Goal status: INITIAL  2.  The patient will achieve 90% intelligibility with use of compensatory speech intelligibility strategies while reading aloud "The [place]father Passage" and "The Rainbow Passage" to an unfamiliar listener.  Baseline:  Goal status: INITIAL  3.  Pt will demonstrate a return to full cervical ROM and function post operatively compared to baselines and not demonstrate any signs or symptoms of lymphedema.  Baseline:  Goal status: INITIAL  LONG TERM GOALS: Target date: 02/09/2023  Pt  will be able to verbalize understanding of a home exercise program for pharyngeal, trismus and cervical range of motion.  Baseline:  Goal status: INITIAL  2.  Patient will improve perception of swallowing as indicated by an improvement in EAT-10 score to 5 (Baseline = 10) by 12 weeks from initial swallowing therapy  session. Baseline:  Goal status: INITIAL  3.  The patient will increase his maximal jaw opening by 10 mm to achieve enhanced speech intelligibility as evidenced by an improved score on a standardized dysarthria assessment.  Baseline:  Goal status: INITIAL  ASSESSMENT:  CLINICAL IMPRESSION: Pt is a 72 year old male who was seen today for a swallowing treatment d/t oropharyngeal cancer resulting in moderate trismus and severe oropharyngeal dysphagia. See above treatment note for details.   Data indicate that pt's swallow ability will likely decrease over the course of radiation/chemoradiation therapy and could very well decline over time following the conclusion of that therapy due to muscle disuse atrophy and/or muscle fibrosis. Pt will continue to need to be seen by SLP in order to assess safety of PO intake, assess the need for recommending any objective swallow assessment, and ensuring pt is correctly completing the individualized HEP.   OBJECTIVE IMPAIRMENTS: include dysarthria, dysphagia, and trismus. These impairments are limiting patient from effectively communicating at home and in community and safety when swallowing. Factors affecting potential to achieve goals and functional outcome are medical prognosis and poor health literacy. Patient will benefit from skilled SLP services to address above impairments and improve overall function.  REHAB POTENTIAL: Good  PLAN:  SLP FREQUENCY: 1-2x/week  SLP DURATION: 8 weeks  PLANNED INTERVENTIONS: Aspiration precaution training, Pharyngeal strengthening exercises, Diet toleration management , Trials of upgraded texture/liquids, SLP instruction and feedback, Compensatory strategies, and Patient/family education   Mishael Krysiak B. Dreama Saa, M.S., CCC-SLP, Tree surgeon Certified Brain Injury Specialist [facility]  [facility] Office [phone] Ascom [phone] Fax  [phone]

## 2023-03-16 DIAGNOSIS — I493 Ventricular premature depolarization: Secondary | ICD-10-CM | POA: Diagnosis not present

## 2023-03-17 ENCOUNTER — Ambulatory Visit: Payer: Medicare Other | Admitting: Radiation Oncology

## 2023-03-18 ENCOUNTER — Other Ambulatory Visit: Payer: Self-pay

## 2023-03-18 ENCOUNTER — Inpatient Hospital Stay: Payer: Medicare Other

## 2023-03-18 ENCOUNTER — Telehealth: Payer: Self-pay | Admitting: Physician Assistant

## 2023-03-18 DIAGNOSIS — I493 Ventricular premature depolarization: Secondary | ICD-10-CM

## 2023-03-18 NOTE — Telephone Encounter (Signed)
Patient is returning call to discuss monitor results.

## 2023-03-18 NOTE — Telephone Encounter (Signed)
The patient has been notified of the result along with recommendations. Pt verbalized understanding. All questions (if any) were answered   EP referral placed     Sondra Barges, PA-C 03/18/2023  2:32 PM EDT     Heart monitor showed a predominant rhythm of sinus with an average rate of 77 bpm (range 68 to 174 bpm), 82 episodes of NSVT occurred with the longest episode lasting just 6 beats, frequent PVCs with a burden of 40%.  BP precludes escalation of metoprolol. Please refer patient to EP for management of frequent PVCs (MD only).

## 2023-03-18 NOTE — Progress Notes (Signed)
Nutrition Follow-up:   Patient with stage IV oropharyngeal SCC that has completed chemotherapy and radiation.    Spoke with patient via phone.  Patient has been seeing SLP and completing exercises at home.  Recovering from a fall he had at the beach.  MD aware of fall.   Has been taking sips of water orally.   Continues to give 12 oz of osmolite 1.5 QID (6 cartons/day).  Usually flushes with 60-12ml water before feeding and 1 1/4 cups of water after each feeding.  Takes stool softner if needed.  Denies nausea.    Reports recently receiving a shipment of formula and syringes.      Medications: reviewed  Labs: glucose 235, BUN 33, creatinine 0.88  Anthropometrics:   Weight 175 lb 1.6 oz on 7/15 176 lb 6.4 oz on 6/27 170 lb 8 oz on 6/14 168 lb 12.8 oz on 5/31 168 lb 14.4 oz on 5/24 177 lb on 5/9 190 lb 4.8 oz on 4/25   Estimated Energy Needs  Kcals: 2150-2580 Protein: 108-129 g Fluid: 2150-2580 ml  NUTRITION DIAGNOSIS: Severe malnutrition improving   MALNUTRITION DIAGNOSIS: Severe malnutrition improving   INTERVENTION:  Continue 6 cartons of osmolite per day (12 oz QID) via feeding tube.  Flush with 60-168ml water before feeding and 1 1/4 cup after each feeding.    Provides 2130 calories, 89 g protein and 2520-2760 ml free water (including from formula) Patient receiving supplies from [employer]    MONITORING, EVALUATION, GOAL: weight trends, tube feeding tolerance   NEXT VISIT: Friday, August 23rd phone call  Liberty Stead B. Freida Busman, RD, LDN Registered Dietitian [phone]

## 2023-03-21 ENCOUNTER — Other Ambulatory Visit: Payer: Self-pay | Admitting: *Deleted

## 2023-03-21 ENCOUNTER — Encounter: Payer: Self-pay | Admitting: Nurse Practitioner

## 2023-03-21 ENCOUNTER — Encounter: Payer: Self-pay | Admitting: Radiation Oncology

## 2023-03-21 ENCOUNTER — Inpatient Hospital Stay (HOSPITAL_BASED_OUTPATIENT_CLINIC_OR_DEPARTMENT_OTHER): Payer: Medicare Other | Admitting: Nurse Practitioner

## 2023-03-21 ENCOUNTER — Ambulatory Visit
Admission: RE | Admit: 2023-03-21 | Discharge: 2023-03-21 | Disposition: A | Discharge status: Home / Self Care | Payer: Medicare Other | Source: Ambulatory Visit | Attending: Radiation Oncology | Admitting: Radiation Oncology

## 2023-03-21 ENCOUNTER — Inpatient Hospital Stay: Payer: Medicare Other

## 2023-03-21 VITALS — BP 107/58 | HR 35 | Temp 97.9°F | Wt 175.0 lb

## 2023-03-21 VITALS — Temp 96.8°F | Resp 16 | Ht 70.0 in | Wt 176.8 lb

## 2023-03-21 DIAGNOSIS — C109 Malignant neoplasm of oropharynx, unspecified: Secondary | ICD-10-CM | POA: Diagnosis not present

## 2023-03-21 DIAGNOSIS — T85848D Pain due to other internal prosthetic devices, implants and grafts, subsequent encounter: Secondary | ICD-10-CM | POA: Diagnosis not present

## 2023-03-21 DIAGNOSIS — R1311 Dysphagia, oral phase: Secondary | ICD-10-CM

## 2023-03-21 DIAGNOSIS — Z931 Gastrostomy status: Secondary | ICD-10-CM

## 2023-03-21 LAB — CMP (CANCER CENTER ONLY)
ALT: 26 U/L (ref 0–44)
AST: 28 U/L (ref 15–41)
Albumin: 4 g/dL (ref 3.5–5.0)
Alkaline Phosphatase: 83 U/L (ref 38–126)
Anion gap: 11 (ref 5–15)
BUN: 27 mg/dL — ABNORMAL HIGH (ref 8–23)
CO2: 27 mmol/L (ref 22–32)
Calcium: 9.2 mg/dL (ref 8.9–10.3)
Chloride: 100 mmol/L (ref 98–111)
Creatinine: 0.79 mg/dL (ref 0.61–1.24)
GFR, Estimated: >60 mL/min (ref >60)
Glucose, Bld: 199 mg/dL — ABNORMAL HIGH (ref 70–99)
Potassium: 4.3 mmol/L (ref 3.5–5.1)
Sodium: 138 mmol/L (ref 135–145)
Total Bilirubin: 0.4 mg/dL (ref 0.3–1.2)
Total Protein: 7.2 g/dL (ref 6.5–8.1)

## 2023-03-21 LAB — CBC WITH DIFFERENTIAL (CANCER CENTER ONLY)
Abs Immature Granulocytes: 0.02 10*3/uL (ref 0.00–0.07)
Basophils Absolute: 0.1 10*3/uL (ref 0.0–0.1)
Basophils Relative: 1 %
Eosinophils Absolute: 0.1 10*3/uL (ref 0.0–0.5)
Eosinophils Relative: 2 %
HCT: 34.1 % — ABNORMAL LOW (ref 39.0–52.0)
Hemoglobin: 11 g/dL — ABNORMAL LOW (ref 13.0–17.0)
Immature Granulocytes: 0 %
Lymphocytes Relative: 13 %
Lymphs Abs: 0.9 10*3/uL (ref 0.7–4.0)
MCH: 36.2 pg — ABNORMAL HIGH (ref 26.0–34.0)
MCHC: 32.3 g/dL (ref 30.0–36.0)
MCV: 112.2 fL — ABNORMAL HIGH (ref 80.0–100.0)
Monocytes Absolute: 0.8 10*3/uL (ref 0.1–1.0)
Monocytes Relative: 12 %
Neutro Abs: 5 10*3/uL (ref 1.7–7.7)
Neutrophils Relative %: 72 %
Platelet Count: 248 10*3/uL (ref 150–400)
RBC: 3.04 MIL/uL — ABNORMAL LOW (ref 4.22–5.81)
RDW: 15.5 % (ref 11.5–15.5)
WBC Count: 6.8 10*3/uL (ref 4.0–10.5)
nRBC: 0 % (ref 0.0–0.2)

## 2023-03-21 MED ORDER — FLUCONAZOLE 100 MG PO TABS: 100.0000 mg | ORAL_TABLET | Freq: Every day | ORAL | 0 refills | Status: AC

## 2023-03-21 NOTE — Progress Notes (Signed)
Radiation Oncology Follow up Note  Name: Kenneth Gilmore   Date:   03/21/2023 MRN:  [id number] DOB: Jul 15, 1951    This 72 y.o. male presents to the clinic today for 1 month follow-up status post concurrent chemoradiation therapy.  For stage IVa (cT3 cN2a M0) squamous cell carcinoma the oropharynx  REFERRING PROVIDER: [facility]  HPI: Patient is a 72 year old male now out 1 month completed concurrent chemoradiation therapy for stage IVa squamous cell carcinoma the oropharynx.  He continues to have problems with dysphagia although his feeding is through PEG tube.  He has significant oral pain.  On examination he does have oral candidiasis..  COMPLICATIONS OF TREATMENT: none  FOLLOW UP COMPLIANCE: keeps appointments   PHYSICAL EXAM:  Temp (!) 96.8 F (36 C)   Resp 16   Ht 5\' 10"  (1.778 m)   Wt 176 lb 12.8 oz (80.2 kg)   BMI 25.37 kg/m  Oral cavity shows oral candidiasis.  He has moderate oral mucositis.  Also some hyperpigmentation and dry desquamation of skin in the neck region.  No evidence of cervical adenopathy is identified.  Well-developed well-nourished patient in NAD. HEENT reveals PERLA, EOMI, discs not visualized.  Oral cavity is clear. No oral mucosal lesions are identified. Neck is clear without evidence of cervical or supraclavicular adenopathy. Lungs are clear to A&P. Cardiac examination is essentially unremarkable with regular rate and rhythm without murmur rub or thrill. Abdomen is benign with no organomegaly or masses noted. Motor sensory and DTR levels are equal and symmetric in the upper and lower extremities. Cranial nerves II through XII are grossly intact. Proprioception is intact. No peripheral adenopathy or edema is identified. No motor or sensory levels are noted. Crude visual fields are within normal range.  RADIOLOGY RESULTS: PET CT scan has been ordered  PLAN: Present time I have started patient on Diflucan.  Of asked to see him back in 3 to 4 months  for follow-up.  He is already established follow-up care with ENT as well as medical oncology.  Continues feeding with PEG tube he is also receiving physical therapy.  Patient knows to call with any concerns.  I would like to take this opportunity to thank you for allowing me to participate in the care of your patient.Carmina Miller, MD

## 2023-03-21 NOTE — Progress Notes (Unsigned)
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  Labs are reviewed and discussed with patient. Proceed with cisplatin 40 mg/m2 He is finishing chemotherapy and radiation.  Plan PET restaging in August. .    Neoplasm related pain  oxycodone 5mg  Q6h PRN pain.      Protein-calorie malnutrition, severe (HCC) On Tube Feeding. Follow up with nutritionist. Weight is stable.  Encourage patient to increase free water flush to 36 oz divided in 3-4 times.      LV (left ventricular) mural thrombus LV thrombus and Atrial Flutter,ejection fraction 35 to 40%,  Continue Eliquis 5mg  BID   S/P percutaneous endoscopic gastrostomy (PEG) tube placement [facility]) Patient can not care for his PEG tube Dressing was changed here in the clinic.  Home care only provides education but he can not do the change by himself.  He will return to radiation oncology  clinic 2-3 times per week for dressing change.   No problem-specific Assessment & Plan notes found for this encounter.     No orders of the defined types were placed in this encounter.   Follow-up 1 week lab NP 2 weeks lab MD. .  All questions were answered. The patient knows to call the clinic with any problems, questions or concerns.  Kenneth Patience, MD, PhD [facility] Hematology Oncology 03/21/2023   HISTORY OF PRESENTING ILLNESS:  Patient presented outpatient ultrasound of his left lower extremity showing positive for DVT. Denies any recent,, surgery, travel or issue.  Denies any family history of DVT.  Reports that he  developed left lower extremity swelling for a week, associated with moderate severity throbbing aching discomfort feels like pulling a muscle.  Denies any shortness of breath. Patient was started on Xarelto starting kit and advised to follow-up with heme-onc.  INTERVAL HISTORY Kenneth Gilmore is a 72 y.o. male presents to re-establish care for oropharyngeal squamous cell carcinoma  Oncology History  Oropharyngeal cancer (HCC)  09/28/2022 Imaging   CT soft tissue neck with contrast showed infiltrating mass in the right tongue with multiple ipsilateral nodal metastases. The primary mass measures at least 4 cm and infiltrates parapharyngeal fat, visualization of tumor extent is limited by artifact from dental amalgam.   10/28/2022 Initial Diagnosis   Squamous cell carcinoma of tongue (HCC) Patient has throat pain and was evaluated by ENT Dr.Bennett.  Physical examination showed cervical lymphadenopathy.  10/15/2022 ultrasound-guided biopsy of right cervical lymph node showed fibrous tissue, detach minute salivary gland tissue.  Granular debris's and a few dysplastic squamous epithelial cells, nondiagnostic.  10/22/2022 repeat ultrasound-guided biopsy of right cervical lymph node was positive for malignancy, metastatic squamous cell carcinoma, keratinizing, in a background of dense fibrosis and tumor necrosis.   10/28/2022 Cancer Staging   Staging form: Oral Cavity, AJCC 8th Edition - Clinical: Stage IVA (cT3, cN2a, cM0) - Signed by Kenneth Patience, MD on 11/02/2022   11/16/2022 Imaging   PET scan showed 1. Large infiltrating right oropharyngeal mass is markedly hypermetabolic and consistent with known squamous cell carcinoma. 2. Bilateral hypermetabolic metastatic cervical lymphadenopathy. 3. No evidence of metastatic disease involving the chest, abdomen/pelvis or bony  structures.     12/09/2022 -  Chemotherapy   Patient is on Treatment Plan : HEAD/NECK Cisplatin (40) q7d     01/03/2023 - 01/11/2023  Hospital Admission   Kenneth Gilmore is a 72 year old male with non-insulin-dependent diabetes mellitus, hyperlipidemia, history of DVT on Xarelto, oropharyngeal squamous cell carcinoma stage IVa, who presents emergency department from oncology clinic for chief concerns of syncope event at home while he was in the shower. High sensitive troponin is 2925.  Telemetry showed atrial flutter, he was initially given diltiazem gtt., and quickly converted to sinus. Patient also had a significant dysphagia from oropharyngeal cancer, G tube is placed on 5/17, TF started.  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  2 drug-eluting stents were placed.  Patient is placed on Plavix.     01/07/2023 Procedure   S/p gastrostomy tube.  placement.  5/22 G tube tip was broken and Luer-Lok tip was replaced in ER    INTERVAL HISTORY Kenneth Gilmore is a 72 y.o. male who has above history reviewed by me today presents for follow up visit for P16 + oropharyngeal squamous cell carcinoma S/p  concurrent chemotherapy with radiation.  Today he reports feeling better. Still has fatigue He tolerates tube feeding.  Denies nausea vomiting, diarrhea, abdominal pain.  + one episode of ''passout" on 7/11, during his trip at the beach with his sisters. He denies hitting his head. He did not seek medical advise after the event. Denies any new weakness, headache, confusion.   Review of Systems  Constitutional:  Positive for malaise/fatigue. Negative for chills, fever and weight loss.  HENT:  Negative for sore throat.   Eyes:  Negative for redness.  Respiratory:  Negative for cough, shortness of breath and wheezing.   Cardiovascular:  Negative for chest pain and palpitations.  Gastrointestinal:  Negative for abdominal pain, blood in stool, nausea and  vomiting.  Genitourinary:  Negative for dysuria.  Musculoskeletal:  Negative for myalgias.  Skin:  Negative for rash.  Neurological:  Negative for dizziness, tingling and tremors.  Endo/Heme/Allergies:  Does not bruise/bleed easily.  Psychiatric/Behavioral:  Negative for hallucinations.     MEDICAL HISTORY:  Past Medical History:  Diagnosis Date  . Diabetes mellitus without complication (HCC)   . Hyperlipidemia   . Hypertension   . Oropharyngeal cancer (HCC)     SURGICAL HISTORY: Past Surgical History:  Procedure Laterality Date  . arm surgery Left   . CORONARY ANGIOPLASTY WITH STENT PLACEMENT    . CORONARY STENT INTERVENTION N/A 01/10/2023   Procedure: CORONARY STENT INTERVENTION;  Surgeon: Iran Ouch, MD;  Location: [facility] INVASIVE CV LAB;  Service: Cardiovascular;  Laterality: N/A;  . IR GASTROSTOMY TUBE MOD SED  01/07/2023  . left side rotator cuff surgery    . RIGHT/LEFT HEART CATH AND CORONARY ANGIOGRAPHY N/A 01/10/2023   Procedure: RIGHT/LEFT HEART CATH AND CORONARY ANGIOGRAPHY;  Surgeon: Iran Ouch, MD;  Location: [facility] INVASIVE CV LAB;  Service: Cardiovascular;  Laterality: N/A;  . SHOULDER SURGERY      SOCIAL HISTORY: Social History   Socioeconomic History  . Marital status: Single    Spouse name: Not on file  . Number of children: Not on file  . Years of education: Not on file  . Highest education level: Not on file  Occupational History  . Occupation:  retired  Tobacco Use  . Smoking status: Never  . Smokeless tobacco: Current    Types: Chew  Substance and Sexual Activity  . Alcohol use: No  . Drug use: Never  . Sexual activity: Not Currently  Other Topics Concern  . Not on file  Social History Narrative  . Not on file   Social Determinants of Health   Financial Resource Strain: Low Risk  (12/27/2022)   Overall Financial Resource Strain (CARDIA)   . Difficulty of Paying Living Expenses: Not hard at all  Food Insecurity: No Food Insecurity  (01/06/2023)   Hunger Vital Sign   . Worried About Programme researcher, broadcasting/film/video in the Last Year: Never true   . Ran Out of Food in the Last Year: Never true  Transportation Needs: No Transportation Needs (01/06/2023)   PRAPARE - Transportation   . Lack of Transportation (Medical): No   . Lack of Transportation (Non-Medical): No  Physical Activity: Not on file  Stress: Stress Concern Present (12/27/2022)   [employer] of Occupational Health - Occupational Stress Questionnaire   . Feeling of Stress : To some extent  Social Connections: Moderately Integrated (12/27/2022)   Social Connection and Isolation Panel [NHANES]   . Frequency of Communication with Friends and Family: More than three times a week   . Frequency of Social Gatherings with Friends and Family: More than three times a week   . Attends Religious Services: More than 4 times per year   . Active Member of Clubs or Organizations: Yes   . Attends Banker Meetings: More than 4 times per year   . Marital Status: Never married  Intimate Partner Violence: Not At Risk (01/06/2023)   Humiliation, Afraid, Rape, and Kick questionnaire   . Fear of Current or Ex-Partner: No   . Emotionally Abused: No   . Physically Abused: No   . Sexually Abused: No    FAMILY HISTORY: Family History  Problem Relation Age of Onset  . Cancer Father        lung    ALLERGIES:  has No Known Allergies.  MEDICATIONS:  Current Outpatient Medications  Medication Sig Dispense Refill  . apixaban (ELIQUIS) 5 MG TABS tablet Place 1 tablet (5 mg total) into feeding tube 2 (two) times daily. 60 tablet 0  . atorvastatin (LIPITOR) 80 MG tablet Place 1 tablet (80 mg total) into feeding tube daily. 30 tablet 0  . clopidogrel (PLAVIX) 75 MG tablet Place 1 tablet (75 mg total) into feeding tube daily with breakfast. 30 tablet 0  . empagliflozin (JARDIANCE) 10 MG TABS tablet Take 1 tablet (10 mg total) by mouth daily. 30 tablet 0  . fluconazole (DIFLUCAN) 100  MG tablet Take 1 tablet (100 mg total) by mouth daily. 10 tablet 0  . metoprolol tartrate (LOPRESSOR) 25 MG tablet Place 0.5 tablets (12.5 mg total) into feeding tube 2 (two) times daily. 60 tablet 0  . Nutritional Supplements (FEEDING SUPPLEMENT, OSMOLITE 1.5 CAL,) LIQD Give 2 cartons at 8am, noon, 4pm feeding via feeding tube.  Give 1 carton at 8pm via feeding tube.  Flush with 60ml water before and after each feeding.  Flush with additional TID between feedings to better meet hydration needs. Send bolus supplies. 1659 mL 0  . oxyCODONE (OXY IR/ROXICODONE) 5 MG immediate release tablet Place 1 tablet (5 mg total) into feeding tube every 6 (six) hours as needed for severe pain. 30 tablet 0  . Protein (FEEDING SUPPLEMENT, PROSOURCE TF20,) liquid  Place 60 mLs into feeding tube 2 (two) times daily.    . Water For Irrigation, Sterile (FREE WATER) SOLN Place 200 mLs into feeding tube 4 (four) times daily - after meals and at bedtime.    Marland Kitchen amoxicillin-clavulanate (AUGMENTIN) 200-28.5 MG/5ML suspension Place 15 mLs (600 mg total) into feeding tube 2 (two) times daily. (Patient not taking: Reported on 03/21/2023) 150 mL 0   No current facility-administered medications for this visit.     PHYSICAL EXAMINATION: ECOG PERFORMANCE STATUS: 1 - Symptomatic but completely ambulatory Vitals:   03/21/23 0939  BP: (!) 107/58  Pulse: (!) 35  Temp: 97.9 F (36.6 C)  SpO2: 100%   Filed Weights   03/21/23 0939  Weight: 175 lb (79.4 kg)    Physical Exam Constitutional:      General: He is not in acute distress. HENT:     Head: Normocephalic and atraumatic.     Mouth/Throat:     Comments: Restricted mouth opening/trismus Thrush Eyes:     General: No scleral icterus. Cardiovascular:     Rate and Rhythm: Normal rate.  Pulmonary:     Effort: Pulmonary effort is normal. No respiratory distress.     Breath sounds: Normal breath sounds. No wheezing.  Abdominal:     General: Bowel sounds are normal.  There is no distension.     Palpations: Abdomen is soft.     Comments: PEG tube site tenderness with erythematous changes  Musculoskeletal:        General: No swelling. Normal range of motion.     Cervical back: Normal range of motion and neck supple.  Skin:    General: Skin is warm and dry.     Findings: No erythema or rash.  Neurological:     Mental Status: He is alert and oriented to person, place, and time. Mental status is at baseline.     Cranial Nerves: No cranial nerve deficit.     Coordination: Coordination normal.  Psychiatric:        Mood and Affect: Mood normal.     LABORATORY DATA:  I have reviewed the data as listed     Latest Ref Rng & Units 03/21/2023    9:02 AM 03/07/2023    9:37 AM 02/25/2023   10:16 AM  CBC  WBC 4.0 - 10.5 K/uL 6.8  9.2  4.6   Hemoglobin 13.0 - 17.0 g/dL 87.5  64.3  9.6   Hematocrit 39.0 - 52.0 % 34.1  30.8  28.9   Platelets 150 - 400 K/uL 248  284  317       Latest Ref Rng & Units 03/07/2023    9:37 AM 02/25/2023   10:16 AM 02/16/2023    9:29 AM  CMP  Glucose 70 - 99 mg/dL 329  518  841   BUN 8 - 23 mg/dL 33  24  25   Creatinine 0.61 - 1.24 mg/dL 6.60  6.30  1.60   Sodium 135 - 145 mmol/L 136  136  136   Potassium 3.5 - 5.1 mmol/L 4.6  4.9  4.6   Chloride 98 - 111 mmol/L 100  98  99   CO2 22 - 32 mmol/L 28  30  29    Calcium 8.9 - 10.3 mg/dL 9.1  9.0  8.9   Total Protein 6.5 - 8.1 g/dL 7.0  6.3  6.3   Total Bilirubin 0.3 - 1.2 mg/dL 0.5  0.3  0.3   Alkaline Phos 38 - 126 U/L  65  65  74   AST 15 - 41 U/L 23  22  19    ALT 0 - 44 U/L 19  19  18

## 2023-03-22 ENCOUNTER — Ambulatory Visit: Payer: Medicare Other | Admitting: Speech Pathology

## 2023-03-22 ENCOUNTER — Other Ambulatory Visit: Payer: Self-pay

## 2023-03-22 ENCOUNTER — Encounter: Payer: Self-pay | Admitting: Oncology

## 2023-03-22 DIAGNOSIS — R252 Cramp and spasm: Secondary | ICD-10-CM

## 2023-03-22 DIAGNOSIS — C109 Malignant neoplasm of oropharynx, unspecified: Secondary | ICD-10-CM | POA: Diagnosis not present

## 2023-03-22 DIAGNOSIS — R1312 Dysphagia, oropharyngeal phase: Secondary | ICD-10-CM | POA: Diagnosis not present

## 2023-03-22 DIAGNOSIS — R471 Dysarthria and anarthria: Secondary | ICD-10-CM | POA: Diagnosis not present

## 2023-03-22 NOTE — Therapy (Unsigned)
OUTPATIENT SPEECH LANGUAGE PATHOLOGY  TREATMENT NOTE   Patient Name: Kenneth Gilmore MRN: [id number] DOB:[birth year], 72 y.o., male Today's Date: 03/22/2023  PCP: [facility], [place] REFERRING PROVIDER: Laurette Schimke, NP  END OF SESSION:  End of Session - 03/22/23 1110     Visit Number 8    Number of Visits 32    Date for SLP Re-Evaluation 05/03/23    Authorization Type [employer]    Progress Note Due on Visit 10    SLP Start Time 1100    SLP Stop Time  1145    SLP Time Calculation (min) 45 min    Activity Tolerance Patient tolerated treatment well             Past Medical History:  Diagnosis Date   Diabetes mellitus without complication (HCC)    Hyperlipidemia    Hypertension    Oropharyngeal cancer (HCC)    Past Surgical History:  Procedure Laterality Date   arm surgery Left    CORONARY ANGIOPLASTY WITH STENT PLACEMENT     CORONARY STENT INTERVENTION N/A 01/10/2023   Procedure: CORONARY STENT INTERVENTION;  Surgeon: Iran Ouch, MD;  Location: [facility] INVASIVE CV LAB;  Service: Cardiovascular;  Laterality: N/A;   IR GASTROSTOMY TUBE MOD SED  01/07/2023   left side rotator cuff surgery     RIGHT/LEFT HEART CATH AND CORONARY ANGIOGRAPHY N/A 01/10/2023   Procedure: RIGHT/LEFT HEART CATH AND CORONARY ANGIOGRAPHY;  Surgeon: Iran Ouch, MD;  Location: [facility] INVASIVE CV LAB;  Service: Cardiovascular;  Laterality: N/A;   SHOULDER SURGERY     Patient Active Problem List   Diagnosis Date Noted   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) 01/25/2023   Essential tremor 01/25/2023   Pure hypercholesterolemia, unspecified 01/25/2023   Benign hypertension 01/25/2023   Type 2 diabetes mellitus (HCC) 01/25/2023   Long term (current) use of anticoagulants 01/25/2023   Exposure to potentially hazardous substance 01/25/2023   Atherosclerosis of coronary artery without angina pectoris 01/25/2023   Chronic ischemic heart disease 01/25/2023   S/P  percutaneous endoscopic gastrostomy (PEG) tube placement (HCC) 01/13/2023   CAD, multiple vessel 01/13/2023   Pancytopenia (HCC) 01/07/2023   Poor nutrition 01/06/2023   Dilated cardiomyopathy (HCC) 01/06/2023   Hypomagnesemia 01/05/2023   Hypokalemia 01/05/2023   LV (left ventricular) mural thrombus 01/05/2023   Chronic systolic heart failure (HCC) 01/05/2023   Thrombocythemia 01/05/2023   Palliative care encounter 01/04/2023   NSTEMI (non-ST elevated myocardial infarction) (HCC) 01/03/2023   Diabetes mellitus type 2, noninsulin dependent (HCC) 01/03/2023   Protein-calorie malnutrition, severe (HCC) 01/03/2023   Benign essential tremor 01/03/2023   Atrial flutter (HCC) 01/03/2023   Hypotension 01/03/2023   Syncope and collapse 01/03/2023   Trismus 12/23/2022   Dysphagia 12/16/2022   Thrush 12/16/2022   Encounter for antineoplastic chemotherapy 12/09/2022   Neoplasm related pain 12/09/2022   Goals of care, counseling/discussion 11/02/2022   History of DVT (deep vein thrombosis) 11/02/2022   Oropharyngeal cancer (HCC) 10/28/2022   Hyperlipidemia 04/01/2014   Systemic primary arterial hypertension 04/01/2014    ONSET DATE: 10/28/2022 date of diagnosis;  12/14/2022 date of referral  REFERRING DIAG: C10.9 (ICD-10-CM) - Oropharyngeal cancer (HCC)   THERAPY DIAG:  Dysphagia, oropharyngeal phase  Oropharyngeal cancer (HCC)  Trismus  Rationale for Evaluation and Treatment: Rehabilitation  SUBJECTIVE:   PERTINENT HISTORY:  Pt is a 72 year old male with past medical history of DVT who is currently undergoing concurrent chemoradiation treatment for at least stage  IVA oropharyngeal squamous cell carcinoma.   DIAGNOSTIC FINDINGS:  CT Soft tissue neck - 09/30/2022 Infiltrating mass in the right tongue measuring  up to 4 cm. Prominent submucosal involvement including in the right  parapharyngeal fat. Assessment of relationship to the pterygoids and  extrinsic tongue musculature  is somewhat hindered by streak artifact  from dental amalgam.   US biopsy - 10/22/2022 Lymph node, right cervical: Positive for malignancy; Metastatic Squamous Cell Carcinoma, Keratinizing, in a background of dense fibrosis and tumor necrosis An immunohistochemical study directed against p16 is strongly and  diffusely positive, indicating an HPV driven malignancy.   PAIN:  Are you having pain? Yes: NPRS scale: 8/10 Pain location: right tongue and jaw Pain description: pt with difficulty describing Aggravating factors: none Relieving factors: heat  FALLS: Has patient fallen in last 6 months?  Yes, while at beach  LIVING ENVIRONMENT: Lives with: lives alone Lives in: House/apartment  PLOF:  Level of assistance: Independent with ADLs, Independent with IADLs Employment: Retired  PATIENT GOALS: "I want to eat solid foods again"  SUBJECTIVE STATEMENT: Pt much more talkative today, even joking with this writer  Pt accompanied by: self  OBJECTIVE:   TODAY'S TREATMENT Skilled treatment session focused on pt's dysphagia, speech intelligibility and trismus goals. SLP facilitated session by providing the following interventions:  Skilled observation of pt consuming thin water via cup was provided. Pt with consistent s/s of aspiration (throat clearing with swallow). Pt with documented silent aspiration of thin liquids. Hopeful this is indication of increased pharyngeal ability.   SLP further facilitated session by providing head to pt's jaw and neck area bilaterally.   Extensive education provided that Neck range of motion exercises should be done to the point of feeling a GENTLE, TOLERABLE stretch only. Demonstration provided with pt able to imitate for the following stretches:  Head Tilt: Forward and Back - Gently bow your head and try to touch your chin to your chest. Raise your chin back to the starting position. Tilt your head back as far as possible so you are looking up at the  ceiling. Return your head to the starting position. - MAXIMAL  Head Tilt: Side to Side: Tilt your head to the side, bringing your ear toward your shoulder. Do not raise your shoulder to your ear. Keep your shoulder still. Return your head to the starting position. MAXIMAL  Head turns: Turn your head to look over your shoulder. Tilt your chin down and try to touch it to your shoulder. Do not raise your shoulder to your chin. Face forward again -MAXIMAL  Heat also applied but little improvement noted in ROM.   Rare Min A provided for Masako and passive/active jaw ROM.    PATIENT EDUCATION: Education details: see above Person educated: Patient Education method: Explanation Education comprehension: verbalized understanding and needs further education  Research states the risk for dysphagia increases due to radiation and/or chemotherapy treatment due to a variety of factors, so SLP educated the pt about the possibility of reduced/limited ability for PO intake during rad tx. SLP also educated pt regarding possible changes to swallowing musculature after rad tx, and why adherence to dysphagia HEP provided today and PO consumption was necessary to inhibit muscle fibrosis following rad tx and to mitigate muscle disuse atrophy. SLP informed pt why this would be detrimental to their swallowing status and to their pulmonary health. Pt demonstrated understanding of these things to SLP. SLP encouraged pt to safely eat and drink as deep into their  radiation/chemotherapy as possible to provide the best possible long-term swallowing outcome for pt.    HOMEWORK:   Passive/Active ROM for jaw   Pharyngeal strengthening exercises   Neck stretches   GOALS: Goals reviewed with patient? Yes  SHORT TERM GOALS: Target date: 10 sessions  Patient will participate in objective swallowing evaluation (MBSS) to identify safest diet recommendation as well as therapeutic targets. Baseline: Goal status: INITIAL  2.  The  patient will achieve 90% intelligibility with use of compensatory speech intelligibility strategies while reading aloud "The Grandfather Passage" and "The Rainbow Passage" to an unfamiliar listener.  Baseline:  Goal status: INITIAL  3.  Pt will demonstrate a return to full cervical ROM and function post operatively compared to baselines and not demonstrate any signs or symptoms of lymphedema.  Baseline:  Goal status: INITIAL  LONG TERM GOALS: Target date: 02/09/2023  Pt will be able to verbalize understanding of a home exercise program for pharyngeal, trismus and cervical range of motion.  Baseline:  Goal status: INITIAL  2.  Patient will improve perception of swallowing as indicated by an improvement in EAT-10 score to 5 (Baseline = 10) by 12 weeks from initial swallowing therapy session. Baseline:  Goal status: INITIAL  3.  The patient will increase his maximal jaw opening by 10 mm to achieve enhanced speech intelligibility as evidenced by an improved score on a standardized dysarthria assessment.  Baseline:  Goal status: INITIAL  ASSESSMENT:  CLINICAL IMPRESSION: Pt is a 72 year old male who was seen today for a swallowing treatment d/t oropharyngeal cancer resulting in moderate trismus and severe oropharyngeal dysphagia. Pt with 25 mm MIO (moderate trismus). See above treatment note for details.   Data indicate that pt's swallow ability will likely decrease over the course of radiation/chemoradiation therapy and could very well decline over time following the conclusion of that therapy due to muscle disuse atrophy and/or muscle fibrosis. Pt will continue to need to be seen by SLP in order to assess safety of PO intake, assess the need for recommending any objective swallow assessment, and ensuring pt is correctly completing the individualized HEP.   OBJECTIVE IMPAIRMENTS: include dysarthria, dysphagia, and trismus. These impairments are limiting patient from effectively communicating  at home and in community and safety when swallowing. Factors affecting potential to achieve goals and functional outcome are medical prognosis and poor health literacy. Patient will benefit from skilled SLP services to address above impairments and improve overall function.  REHAB POTENTIAL: Good  PLAN:  SLP FREQUENCY: 1-2x/week  SLP DURATION: 8 weeks  PLANNED INTERVENTIONS: Aspiration precaution training, Pharyngeal strengthening exercises, Diet toleration management , Trials of upgraded texture/liquids, SLP instruction and feedback, Compensatory strategies, and Patient/family education   Toniyah Dilmore B. Dreama Saa, M.S., CCC-SLP, Tree surgeon Certified Brain Injury Specialist [facility]  [facility] Office [phone] Ascom [phone] Fax [phone]

## 2023-03-23 ENCOUNTER — Ambulatory Visit: Payer: Medicare Other | Admitting: Radiation Oncology

## 2023-03-24 ENCOUNTER — Telehealth: Payer: Self-pay | Admitting: Cardiovascular Disease

## 2023-03-24 NOTE — Telephone Encounter (Signed)
Called and spoke with patient. Patient says that he is supposed to be scheduled with EP based on his heart monitor results. Message sent to scheduling.

## 2023-03-24 NOTE — Telephone Encounter (Signed)
Caller stated patient had a syncopal episode and wants to follow-up.

## 2023-03-28 ENCOUNTER — Ambulatory Visit: Payer: Medicare Other | Admitting: Speech Pathology

## 2023-03-30 ENCOUNTER — Encounter: Payer: Medicare Other | Admitting: Speech Pathology

## 2023-04-04 ENCOUNTER — Other Ambulatory Visit: Payer: Self-pay | Admitting: Physician Assistant

## 2023-04-04 ENCOUNTER — Telehealth: Payer: Self-pay | Admitting: Speech Pathology

## 2023-04-04 ENCOUNTER — Ambulatory Visit: Payer: Medicare Other | Attending: Radiation Oncology | Admitting: Speech Pathology

## 2023-04-04 DIAGNOSIS — I5020 Unspecified systolic (congestive) heart failure: Secondary | ICD-10-CM

## 2023-04-04 DIAGNOSIS — I255 Ischemic cardiomyopathy: Secondary | ICD-10-CM

## 2023-04-04 DIAGNOSIS — I4892 Unspecified atrial flutter: Secondary | ICD-10-CM

## 2023-04-04 DIAGNOSIS — Z931 Gastrostomy status: Secondary | ICD-10-CM

## 2023-04-04 DIAGNOSIS — I513 Intracardiac thrombosis, not elsewhere classified: Secondary | ICD-10-CM

## 2023-04-04 DIAGNOSIS — I10 Essential (primary) hypertension: Secondary | ICD-10-CM

## 2023-04-04 DIAGNOSIS — I493 Ventricular premature depolarization: Secondary | ICD-10-CM

## 2023-04-04 DIAGNOSIS — E785 Hyperlipidemia, unspecified: Secondary | ICD-10-CM

## 2023-04-04 DIAGNOSIS — C109 Malignant neoplasm of oropharynx, unspecified: Secondary | ICD-10-CM

## 2023-04-04 DIAGNOSIS — D649 Anemia, unspecified: Secondary | ICD-10-CM

## 2023-04-04 DIAGNOSIS — I214 Non-ST elevation (NSTEMI) myocardial infarction: Secondary | ICD-10-CM

## 2023-04-04 DIAGNOSIS — Z86718 Personal history of other venous thrombosis and embolism: Secondary | ICD-10-CM

## 2023-04-04 DIAGNOSIS — I2510 Atherosclerotic heart disease of native coronary artery without angina pectoris: Secondary | ICD-10-CM

## 2023-04-04 NOTE — Telephone Encounter (Signed)
Pt called this Clinical research associate providing information that he was recently seen at the [place] where there was concern for potential neck fracture. ST committed to reviewing pt's chart to assist with developing appropriate POC.    B. Dreama Saa, M.S., CCC-SLP, Tree surgeon Certified Brain Injury Specialist [facility]  [facility] Services Office [phone] Ascom [phone] Fax [phone]

## 2023-04-05 ENCOUNTER — Ambulatory Visit
Admission: RE | Admit: 2023-04-05 | Discharge: 2023-04-05 | Disposition: A | Discharge status: Home / Self Care | Payer: Medicare Other | Source: Ambulatory Visit | Attending: Oncology | Admitting: Oncology

## 2023-04-05 ENCOUNTER — Inpatient Hospital Stay: Payer: Medicare Other

## 2023-04-05 DIAGNOSIS — C109 Malignant neoplasm of oropharynx, unspecified: Secondary | ICD-10-CM

## 2023-04-06 ENCOUNTER — Inpatient Hospital Stay: Payer: Medicare Other | Attending: Oncology

## 2023-04-06 DIAGNOSIS — Z931 Gastrostomy status: Secondary | ICD-10-CM | POA: Diagnosis not present

## 2023-04-06 DIAGNOSIS — E43 Unspecified severe protein-calorie malnutrition: Secondary | ICD-10-CM | POA: Diagnosis not present

## 2023-04-06 DIAGNOSIS — Z9221 Personal history of antineoplastic chemotherapy: Secondary | ICD-10-CM | POA: Diagnosis not present

## 2023-04-06 DIAGNOSIS — Z7901 Long term (current) use of anticoagulants: Secondary | ICD-10-CM | POA: Diagnosis not present

## 2023-04-06 DIAGNOSIS — I219 Acute myocardial infarction, unspecified: Secondary | ICD-10-CM | POA: Diagnosis not present

## 2023-04-06 DIAGNOSIS — Z923 Personal history of irradiation: Secondary | ICD-10-CM | POA: Insufficient documentation

## 2023-04-06 DIAGNOSIS — C109 Malignant neoplasm of oropharynx, unspecified: Secondary | ICD-10-CM | POA: Diagnosis not present

## 2023-04-06 LAB — CBC WITH DIFFERENTIAL (CANCER CENTER ONLY)
Abs Immature Granulocytes: 0.02 10*3/uL (ref 0.00–0.07)
Basophils Absolute: 0 10*3/uL (ref 0.0–0.1)
Basophils Relative: 0 %
Eosinophils Absolute: 0.1 10*3/uL (ref 0.0–0.5)
Eosinophils Relative: 1 %
HCT: 36.6 % — ABNORMAL LOW (ref 39.0–52.0)
Hemoglobin: 11.9 g/dL — ABNORMAL LOW (ref 13.0–17.0)
Immature Granulocytes: 0 %
Lymphocytes Relative: 18 %
Lymphs Abs: 1.2 10*3/uL (ref 0.7–4.0)
MCH: 35.2 pg — ABNORMAL HIGH (ref 26.0–34.0)
MCHC: 32.5 g/dL (ref 30.0–36.0)
MCV: 108.3 fL — ABNORMAL HIGH (ref 80.0–100.0)
Monocytes Absolute: 0.8 10*3/uL (ref 0.1–1.0)
Monocytes Relative: 12 %
Neutro Abs: 4.7 10*3/uL (ref 1.7–7.7)
Neutrophils Relative %: 69 %
Platelet Count: 190 10*3/uL (ref 150–400)
RBC: 3.38 MIL/uL — ABNORMAL LOW (ref 4.22–5.81)
RDW: 13.3 % (ref 11.5–15.5)
WBC Count: 6.9 10*3/uL (ref 4.0–10.5)
nRBC: 0 % (ref 0.0–0.2)

## 2023-04-06 LAB — CMP (CANCER CENTER ONLY)
ALT: 24 U/L (ref 0–44)
AST: 30 U/L (ref 15–41)
Albumin: 4 g/dL (ref 3.5–5.0)
Alkaline Phosphatase: 80 U/L (ref 38–126)
Anion gap: 11 (ref 5–15)
BUN: 26 mg/dL — ABNORMAL HIGH (ref 8–23)
CO2: 29 mmol/L (ref 22–32)
Calcium: 9.7 mg/dL (ref 8.9–10.3)
Chloride: 99 mmol/L (ref 98–111)
Creatinine: 0.76 mg/dL (ref 0.61–1.24)
GFR, Estimated: >60 mL/min (ref >60)
Glucose, Bld: 149 mg/dL — ABNORMAL HIGH (ref 70–99)
Potassium: 4.9 mmol/L (ref 3.5–5.1)
Sodium: 139 mmol/L (ref 135–145)
Total Bilirubin: 0.6 mg/dL (ref 0.3–1.2)
Total Protein: 7.4 g/dL (ref 6.5–8.1)

## 2023-04-06 LAB — MAGNESIUM: Magnesium: 2.1 mg/dL (ref 1.7–2.4)

## 2023-04-11 ENCOUNTER — Ambulatory Visit
Admission: RE | Admit: 2023-04-11 | Discharge: 2023-04-11 | Disposition: A | Discharge status: Home / Self Care | Payer: Medicare Other | Source: Ambulatory Visit | Attending: Oncology | Admitting: Oncology

## 2023-04-11 DIAGNOSIS — E119 Type 2 diabetes mellitus without complications: Secondary | ICD-10-CM | POA: Insufficient documentation

## 2023-04-11 DIAGNOSIS — I2510 Atherosclerotic heart disease of native coronary artery without angina pectoris: Secondary | ICD-10-CM | POA: Diagnosis not present

## 2023-04-11 DIAGNOSIS — M8580 Other specified disorders of bone density and structure, unspecified site: Secondary | ICD-10-CM | POA: Diagnosis not present

## 2023-04-11 DIAGNOSIS — C109 Malignant neoplasm of oropharynx, unspecified: Secondary | ICD-10-CM | POA: Diagnosis not present

## 2023-04-11 DIAGNOSIS — I700 Atherosclerosis of aorta: Secondary | ICD-10-CM | POA: Diagnosis not present

## 2023-04-11 DIAGNOSIS — N400 Enlarged prostate without lower urinary tract symptoms: Secondary | ICD-10-CM | POA: Diagnosis not present

## 2023-04-11 DIAGNOSIS — K8020 Calculus of gallbladder without cholecystitis without obstruction: Secondary | ICD-10-CM | POA: Diagnosis not present

## 2023-04-11 DIAGNOSIS — C779 Secondary and unspecified malignant neoplasm of lymph node, unspecified: Secondary | ICD-10-CM | POA: Diagnosis not present

## 2023-04-11 DIAGNOSIS — M47819 Spondylosis without myelopathy or radiculopathy, site unspecified: Secondary | ICD-10-CM | POA: Diagnosis not present

## 2023-04-11 DIAGNOSIS — C029 Malignant neoplasm of tongue, unspecified: Secondary | ICD-10-CM | POA: Diagnosis not present

## 2023-04-11 LAB — GLUCOSE, CAPILLARY: Glucose-Capillary: 81 mg/dL (ref 70–99)

## 2023-04-11 MED ORDER — FLUDEOXYGLUCOSE F - 18 (FDG) INJECTION
9.5300 | Freq: Once | INTRAVENOUS | Status: AC | PRN
  Administered 2023-04-11: 9.53 via INTRAVENOUS

## 2023-04-12 ENCOUNTER — Telehealth: Payer: Self-pay | Admitting: *Deleted

## 2023-04-12 NOTE — Telephone Encounter (Signed)
Called report  IMPRESSION: 1. Response to therapy of right tongue base primary and cervical nodal metastasis. 2. New hypermetabolism within the right side of the epiglottis is nonspecific and without CT correlate. Consider direct visualization. 3. No distant metastasis identified. 4. New hypermetabolism about the C6-7 level. Possible fractures in the region of the pars, suboptimally and incompletely evaluated. If clinical concern of fracture (trauma by report on on 01/03/2023) repeat cervical spine CT recommended. If metastasis or infection are clinically suspected (felt less likely) pre and postcontrast cervical spine MRI could be performed. 5. Cholelithiasis with possible pericholecystic edema. Correlate with right upper quadrant symptoms and consider ultrasound. 6. Incidental findings, including: Coronary artery atherosclerosis. Aortic Atherosclerosis (ICD10-I70.0). Prostatomegaly. 7. Aortic valvular calcifications. Consider echocardiography to evaluate for valvular dysfunction.   These results will be called to the ordering clinician or representative by the Radiologist Assistant, and communication documented in the PACS or [employer].     Electronically Signed   By: Jeronimo Greaves M.D.   On: 04/12/2023 15:35

## 2023-04-13 ENCOUNTER — Encounter: Payer: Self-pay | Admitting: Oncology

## 2023-04-13 ENCOUNTER — Inpatient Hospital Stay: Payer: Medicare Other

## 2023-04-13 ENCOUNTER — Inpatient Hospital Stay: Payer: Medicare Other | Admitting: Oncology

## 2023-04-13 VITALS — BP 95/66 | HR 71 | Temp 97.1°F | Resp 18 | Wt 176.0 lb

## 2023-04-13 DIAGNOSIS — I513 Intracardiac thrombosis, not elsewhere classified: Secondary | ICD-10-CM

## 2023-04-13 DIAGNOSIS — E43 Unspecified severe protein-calorie malnutrition: Secondary | ICD-10-CM

## 2023-04-13 DIAGNOSIS — C109 Malignant neoplasm of oropharynx, unspecified: Secondary | ICD-10-CM

## 2023-04-13 DIAGNOSIS — Z7901 Long term (current) use of anticoagulants: Secondary | ICD-10-CM | POA: Diagnosis not present

## 2023-04-13 DIAGNOSIS — Z931 Gastrostomy status: Secondary | ICD-10-CM | POA: Diagnosis not present

## 2023-04-13 DIAGNOSIS — I219 Acute myocardial infarction, unspecified: Secondary | ICD-10-CM | POA: Diagnosis not present

## 2023-04-13 DIAGNOSIS — Z923 Personal history of irradiation: Secondary | ICD-10-CM | POA: Diagnosis not present

## 2023-04-13 DIAGNOSIS — Z9221 Personal history of antineoplastic chemotherapy: Secondary | ICD-10-CM | POA: Diagnosis not present

## 2023-04-13 MED ORDER — APIXABAN 5 MG PO TABS: 5.0000 mg | ORAL_TABLET | Freq: Two times a day (BID) | ORAL | 3 refills | Status: DC

## 2023-04-13 NOTE — Assessment & Plan Note (Signed)
Patient can not care for his PEG tube Dressing was changed here in the clinic.  Home care only provides education but he can not do the change by himself.  He will return to radiation oncology  clinic 2-3 times per week for dressing change.

## 2023-04-13 NOTE — Assessment & Plan Note (Addendum)
stage IVA oropharyngeal squamous cell carcinoma, S/p concurrent cisplatin with Radiation.  Labs are reviewed and discussed with patient. PET showed good response, still has residual hypermetabolic activity residual soft tissue fullness towards the palatine tonsil SUV 5.7 Small right-sided level 2 nodes 6 mm with S.U.V. max of 3.1 New focus of hypermetabolism at the right side of the epiglottis without CT correlate - SUV of 6.5  C6-7 level hypermetabolism is likely due to recent fracture- [facility]   I recommend patient to see ENT Dr. Willeen Cass for direct visualization, and possible biopsy if suspicious lesion, he reports having upcoming appt with him.  Possible residual disease vs inflammation. PET is about 9-10 weeks after last treatment, he is very anxious and PET scan was done a bit earlier than planned 12 weeks.  Repeat PET in 3 months.

## 2023-04-13 NOTE — Progress Notes (Signed)
Hematology/Oncology Progress note [phone] [phone]      REASON FOR VISIT Follow up for oropharyngeal squamous cell carcinoma  ASSESSMENT & PLAN:   Cancer Staging  Oropharyngeal cancer (HCC) Staging form: Oral Cavity, AJCC 8th Edition - Clinical: Stage IVA (cT3, cN2a, cM0) - Signed by Kenneth Patience, MD on 11/02/2022   LV (left ventricular) mural thrombus LV thrombus and Atrial Flutter,ejection fraction 35 to 40%, It is unclear whether he is taking Eliquis. Recommend patient to continue Eliquis 5mg  BID, refills sent.   S/P percutaneous endoscopic gastrostomy (PEG) tube placement [facility]) Patient can not care for his PEG tube Dressing was changed here in the clinic.  Home care only provides education but he can not do the change by himself.  He will return to radiation oncology  clinic 2-3 times per week for dressing change.    Protein-calorie malnutrition, severe (HCC) On Tube Feeding. Follow up with nutritionist. Weight is stable.  Encourage patient to increase free water flush to 36 oz divided in 3-4 times.    Oropharyngeal cancer (HCC) stage IVA oropharyngeal squamous cell carcinoma, S/p concurrent cisplatin with Radiation.  Labs are reviewed and discussed with patient. PET showed good response, still has residual hypermetabolic activity residual soft tissue fullness towards the palatine tonsil SUV 5.7 Small right-sided level 2 nodes 6 mm with S.U.V. max of 3.1 New focus of hypermetabolism at the right side of the epiglottis without CT correlate - SUV of 6.5  C6-7 level hypermetabolism is likely due to recent fracture- [facility] and [facility].   I recommend patient to see ENT Dr. Willeen Cass for direct visualization, and possible biopsy if suspicious lesion, he reports having upcoming appt with him.  Possible residual disease vs inflammation. PET is about 9-10 weeks after last treatment, he is very anxious and PET scan was done a  bit earlier than planned 12 weeks.  Repeat PET in 3 months.      Orders Placed This Encounter  Procedures   NM PET Image Restag (PS) Skull Base To Thigh    Standing Status:   Future    Standing Expiration Date:   04/12/2024    Order Specific Question:   If indicated for the ordered procedure, I authorize the administration of a radiopharmaceutical per Radiology protocol    Answer:   Yes    Order Specific Question:   Preferred imaging location?    Answer:   [facility]   CBC with Differential (Cancer Center Only)    Standing Status:   Future    Standing Expiration Date:   04/12/2024   CMP (Cancer Center only)    Standing Status:   Future    Standing Expiration Date:   04/12/2024   Vitamin B12    Standing Status:   Future    Standing Expiration Date:   04/12/2024   Folate    Standing Status:   Future    Standing Expiration Date:   04/12/2024   TSH    Standing Status:   Future    Standing Expiration Date:   04/12/2024    Follow-up 3 months lab MD. .  All questions were answered. The patient knows to call the clinic with any problems, questions or concerns.  Kenneth Patience, MD, PhD [facility] Hematology Oncology 04/13/2023   HISTORY OF PRESENTING ILLNESS:  Patient presented outpatient ultrasound of his left lower extremity showing positive for DVT. Denies any recent,, surgery, travel or issue.  Denies any  family history of DVT.  Reports that he developed left lower extremity swelling for a week, associated with moderate severity throbbing aching discomfort feels like pulling a muscle.  Denies any shortness of breath. Patient was started on Xarelto starting kit and advised to follow-up with heme-onc.  INTERVAL HISTORY Kenneth Gilmore is a 72 y.o. male presents to re-establish care for oropharyngeal squamous cell carcinoma  Oncology History  Oropharyngeal cancer (HCC)  09/28/2022 Imaging   CT soft tissue neck with contrast showed infiltrating mass in the right tongue with multiple  ipsilateral nodal metastases. The primary mass measures at least 4 cm and infiltrates parapharyngeal fat, visualization of tumor extent is limited by artifact from dental amalgam.   10/28/2022 Initial Diagnosis   Squamous cell carcinoma of tongue (HCC) Patient has throat pain and was evaluated by ENT Dr.Bennett.  Physical examination showed cervical lymphadenopathy.  10/15/2022 ultrasound-guided biopsy of right cervical lymph node showed fibrous tissue, detach minute salivary gland tissue.  Granular debris's and a few dysplastic squamous epithelial cells, nondiagnostic.  10/22/2022 repeat ultrasound-guided biopsy of right cervical lymph node was positive for malignancy, metastatic squamous cell carcinoma, keratinizing, in a background of dense fibrosis and tumor necrosis.   10/28/2022 Cancer Staging   Staging form: Oral Cavity, AJCC 8th Edition - Clinical: Stage IVA (cT3, cN2a, cM0) - Signed by Kenneth Patience, MD on 11/02/2022   11/16/2022 Imaging   PET scan showed 1. Large infiltrating right oropharyngeal mass is markedly hypermetabolic and consistent with known squamous cell carcinoma. 2. Bilateral hypermetabolic metastatic cervical lymphadenopathy. 3. No evidence of metastatic disease involving the chest, abdomen/pelvis or bony structures.     12/09/2022 -  Chemotherapy   Patient is on Treatment Plan : HEAD/NECK Cisplatin (40) q7d     01/03/2023 - 01/11/2023 Hospital Admission   Mr. Kenneth Gilmore is a 72 year old male with non-insulin-dependent diabetes mellitus, hyperlipidemia, history of DVT on Xarelto, oropharyngeal squamous cell carcinoma stage IVa, who presents emergency department from oncology clinic for chief concerns of syncope event at home while he was in the shower. High sensitive troponin is 2925.  Telemetry showed atrial flutter, he was initially given diltiazem gtt., and quickly converted to sinus. Patient also had a significant dysphagia from oropharyngeal cancer, G tube is placed on  5/17, TF started.  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  2 drug-eluting stents were placed.  Patient is placed on Plavix.     01/07/2023 Procedure   S/p gastrostomy tube.  placement.  5/22 G tube tip was broken and Luer-Lok tip was replaced in ER    INTERVAL HISTORY SEVAN FINSETH is a 72 y.o. male who has above history reviewed by me today presents for follow up visit for P16 + oropharyngeal squamous cell carcinoma S/p  concurrent chemotherapy with radiation.  Today he reports feeling better. He comes to Radonc department to get his tube feeding dressing changed.  + DysphagiaHe tolerates tube feeding.  Denies nausea vomiting, diarrhea, abdominal pain.  + Cervical fracture after his fall on 7/11, he sees [place] and plan to have surgery.  Denies any new weakness, headache, confusion.   Review of Systems  Constitutional:  Positive for malaise/fatigue. Negative for chills, fever and weight loss.  HENT:  Negative for sore throat.   Eyes:  Negative for redness.  Respiratory:  Negative for cough,  shortness of breath and wheezing.   Cardiovascular:  Negative for chest pain and palpitations.  Gastrointestinal:  Negative for abdominal pain, blood in stool, nausea and vomiting.       Dysphagia  Genitourinary:  Negative for dysuria.  Musculoskeletal:  Negative for myalgias.  Skin:  Negative for rash.  Neurological:  Negative for dizziness, tingling and tremors.  Endo/Heme/Allergies:  Does not bruise/bleed easily.  Psychiatric/Behavioral:  Negative for hallucinations.     MEDICAL HISTORY:  Past Medical History:  Diagnosis Date   Diabetes mellitus without complication (HCC)    Hyperlipidemia    Hypertension    Oropharyngeal cancer (HCC)     SURGICAL HISTORY: Past Surgical History:  Procedure  Laterality Date   arm surgery Left    CORONARY ANGIOPLASTY WITH STENT PLACEMENT     CORONARY STENT INTERVENTION N/A 01/10/2023   Procedure: CORONARY STENT INTERVENTION;  Surgeon: Iran Ouch, MD;  Location: [facility] INVASIVE CV LAB;  Service: Cardiovascular;  Laterality: N/A;   IR GASTROSTOMY TUBE MOD SED  01/07/2023   left side rotator cuff surgery     RIGHT/LEFT HEART CATH AND CORONARY ANGIOGRAPHY N/A 01/10/2023   Procedure: RIGHT/LEFT HEART CATH AND CORONARY ANGIOGRAPHY;  Surgeon: Iran Ouch, MD;  Location: [facility] INVASIVE CV LAB;  Service: Cardiovascular;  Laterality: N/A;   SHOULDER SURGERY      SOCIAL HISTORY: Social History   Socioeconomic History   Marital status: Single    Spouse name: Not on file   Number of children: Not on file   Years of education: Not on file   Highest education level: Not on file  Occupational History   Occupation: retired  Tobacco Use   Smoking status: Never   Smokeless tobacco: Current    Types: Chew  Substance and Sexual Activity   Alcohol use: No   Drug use: Never   Sexual activity: Not Currently  Other Topics Concern   Not on file  Social History Narrative   Not on file   Social Determinants of Health   Financial Resource Strain: Low Risk  (12/27/2022)   Overall Financial Resource Strain (CARDIA)    Difficulty of Paying Living Expenses: Not hard at all  Food Insecurity: No Food Insecurity (01/06/2023)   Hunger Vital Sign    Worried About Running Out of Food in the Last Year: Never true    Ran Out of Food in the Last Year: Never true  Transportation Needs: No Transportation Needs (01/06/2023)   PRAPARE - Administrator, Civil Service (Medical): No    Lack of Transportation (Non-Medical): No  Physical Activity: Not on file  Stress: Stress Concern Present (12/27/2022)   [employer] of Occupational Health - Occupational Stress Questionnaire    Feeling of Stress : To some extent  Social Connections: Moderately  Integrated (12/27/2022)   Social Connection and Isolation Panel [NHANES]    Frequency of Communication with Friends and Family: More than three times a week    Frequency of Social Gatherings with Friends and Family: More than three times a week    Attends Religious Services: More than 4 times per year    Active Member of [employer] or Organizations: Yes    Attends Banker Meetings: More than 4 times per year    Marital Status: Never married  Intimate Partner Violence: Not At Risk (01/06/2023)   Humiliation, Afraid, Rape, and Kick questionnaire    Fear of Current or Ex-Partner: No    Emotionally Abused: No  Physically Abused: No    Sexually Abused: No    FAMILY HISTORY: Family History  Problem Relation Age of Onset   Cancer Father        lung    ALLERGIES:  has No Known Allergies.  MEDICATIONS:  Current Outpatient Medications  Medication Sig Dispense Refill   atorvastatin (LIPITOR) 80 MG tablet Place 1 tablet (80 mg total) into feeding tube daily. 30 tablet 0   clopidogrel (PLAVIX) 75 MG tablet Place 1 tablet (75 mg total) into feeding tube daily with breakfast. 30 tablet 0   empagliflozin (JARDIANCE) 10 MG TABS tablet Take 1 tablet (10 mg total) by mouth daily. 30 tablet 0   fluconazole (DIFLUCAN) 100 MG tablet Take 1 tablet (100 mg total) by mouth daily. 10 tablet 0   metoprolol tartrate (LOPRESSOR) 25 MG tablet Place 0.5 tablets (12.5 mg total) into feeding tube 2 (two) times daily. 60 tablet 0   Nutritional Supplements (FEEDING SUPPLEMENT, OSMOLITE 1.5 CAL,) LIQD Give 2 cartons at 8am, noon, 4pm feeding via feeding tube.  Give 1 carton at 8pm via feeding tube.  Flush with 60ml water before and after each feeding.  Flush with additional TID between feedings to better meet hydration needs. Send bolus supplies. 1659 mL 0   oxyCODONE (OXY IR/ROXICODONE) 5 MG immediate release tablet Place 1 tablet (5 mg total) into feeding tube every 6 (six) hours as needed for severe  pain. 30 tablet 0   Protein (FEEDING SUPPLEMENT, PROSOURCE TF20,) liquid Place 60 mLs into feeding tube 2 (two) times daily.     Water For Irrigation, Sterile (FREE WATER) SOLN Place 200 mLs into feeding tube 4 (four) times daily - after meals and at bedtime.     amoxicillin-clavulanate (AUGMENTIN) 200-28.5 MG/5ML suspension Place 15 mLs (600 mg total) into feeding tube 2 (two) times daily. (Patient not taking: Reported on 03/21/2023) 150 mL 0   apixaban (ELIQUIS) 5 MG TABS tablet Place 1 tablet (5 mg total) into feeding tube 2 (two) times daily. 60 tablet 3   No current facility-administered medications for this visit.     PHYSICAL EXAMINATION: ECOG PERFORMANCE STATUS: 1 - Symptomatic but completely ambulatory Vitals:   04/13/23 1005  BP: 95/66  Pulse: 71  Resp: 18  Temp: (!) 97.1 F (36.2 C)   Filed Weights   04/13/23 1005  Weight: 176 lb (79.8 kg)    Physical Exam Constitutional:      General: He is not in acute distress. HENT:     Head: Normocephalic and atraumatic.     Mouth/Throat:     Comments: Restricted mouth opening/trismus Thrush Eyes:     General: No scleral icterus. Neck:     Comments: Collar immobilizer Cardiovascular:     Rate and Rhythm: Normal rate.  Pulmonary:     Effort: Respiratory distress present.  Abdominal:     General: There is no distension.     Palpations: Abdomen is soft.     Comments: PEG tube site tenderness with erythematous changes  Musculoskeletal:        General: No swelling. Normal range of motion.  Skin:    Findings: No erythema or rash.  Neurological:     Mental Status: He is alert and oriented to person, place, and time. Mental status is at baseline.     Cranial Nerves: No cranial nerve deficit.  Psychiatric:        Mood and Affect: Mood normal.      LABORATORY DATA:  I  have reviewed the data as listed     Latest Ref Rng & Units 04/06/2023    8:55 AM 03/21/2023    9:02 AM 03/07/2023    9:37 AM  CBC  WBC 4.0 - 10.5  K/uL 6.9  6.8  9.2   Hemoglobin 13.0 - 17.0 g/dL 16.1  09.6  04.5   Hematocrit 39.0 - 52.0 % 36.6  34.1  30.8   Platelets 150 - 400 K/uL 190  248  284       Latest Ref Rng & Units 04/06/2023    8:55 AM 03/21/2023    9:02 AM 03/07/2023    9:37 AM  CMP  Glucose 70 - 99 mg/dL 409  811  914   BUN 8 - 23 mg/dL 26  27  33   Creatinine 0.61 - 1.24 mg/dL 7.82  9.56  2.13   Sodium 135 - 145 mmol/L 139  138  136   Potassium 3.5 - 5.1 mmol/L 4.9  4.3  4.6   Chloride 98 - 111 mmol/L 99  100  100   CO2 22 - 32 mmol/L 29  27  28    Calcium 8.9 - 10.3 mg/dL 9.7  9.2  9.1   Total Protein 6.5 - 8.1 g/dL 7.4  7.2  7.0   Total Bilirubin 0.3 - 1.2 mg/dL 0.6  0.4  0.5   Alkaline Phos 38 - 126 U/L 80  83  65   AST 15 - 41 U/L 30  28  23    ALT 0 - 44 U/L 24  26  19

## 2023-04-13 NOTE — Assessment & Plan Note (Addendum)
LV thrombus and Atrial Flutter,ejection fraction 35 to 40%, It is unclear whether he is taking Eliquis. Recommend patient to continue Eliquis 5mg  BID, refills sent.

## 2023-04-13 NOTE — Assessment & Plan Note (Signed)
On Tube Feeding. Follow up with nutritionist. Weight is stable.  Encourage patient to increase free water flush to 36 oz divided in 3-4 times.

## 2023-04-13 NOTE — Progress Notes (Signed)
Pt here for follow up. [place].

## 2023-04-14 ENCOUNTER — Other Ambulatory Visit: Payer: Self-pay

## 2023-04-15 ENCOUNTER — Inpatient Hospital Stay: Payer: Medicare Other

## 2023-04-15 NOTE — Progress Notes (Signed)
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    Spoke with patient via phone.  Reports that [place] has resulted in him wearing a neck brace and having to have surgery on 9/25 at the [place].  SLP is on hold at this time due to neck injury.   Continues to give 12 oz of osmolite 1.5 QID (6 cartons/day) via PEG tube.  Usually flushes with 60-155ml water before feeding and 1 1/4 cups after each feeding.    Medications: reviewed  Labs: 8/14 glucose 149, BUN 26, creatinine 0.76, Hgb 11.9  Anthropometrics:   Weight 176 lb on 8/21 176 lb on 6/27 168 lb on 5/31 190 lb on 4/25   Estimated Energy Needs  Kcals: 2150-2580 Protein: 108-129 g Fluid: 2150-2580 ml  NUTRITION DIAGNOSIS: Severe malnutrition improved   MALNUTRITION DIAGNOSIS: Severe malnutrition improved   INTERVENTION:  Continue 6 cartons of osmolite 1.5 daily via feeding tube (12 oz QID).  Flush with 60-165ml water before and 1 1/4 cup water after each feeding.   Provides 2130 calories, 89 g protein, and 2520-2790ml free water.   Patient receiving supplies from [employer]    MONITORING, EVALUATION, GOAL: weight trends, intake   NEXT VISIT: phone call Friday, Oct 11th  Archie Atilano B. Freida Busman, RD, LDN Registered Dietitian [phone]

## 2023-04-18 ENCOUNTER — Ambulatory Visit: Payer: Medicare Other

## 2023-04-18 DIAGNOSIS — I513 Intracardiac thrombosis, not elsewhere classified: Secondary | ICD-10-CM | POA: Diagnosis not present

## 2023-04-18 DIAGNOSIS — I214 Non-ST elevation (NSTEMI) myocardial infarction: Secondary | ICD-10-CM

## 2023-04-18 DIAGNOSIS — I2510 Atherosclerotic heart disease of native coronary artery without angina pectoris: Secondary | ICD-10-CM | POA: Diagnosis not present

## 2023-04-18 LAB — ECHOCARDIOGRAM LIMITED

## 2023-04-18 MED ORDER — PERFLUTREN LIPID MICROSPHERE
1.0000 mL | INTRAVENOUS | Status: AC | PRN
Start: 2023-04-18 — End: 2023-04-18
  Administered 2023-04-18: 2 mL via INTRAVENOUS

## 2023-04-22 DIAGNOSIS — Z931 Gastrostomy status: Secondary | ICD-10-CM | POA: Diagnosis not present

## 2023-04-22 DIAGNOSIS — R252 Cramp and spasm: Secondary | ICD-10-CM | POA: Diagnosis not present

## 2023-04-22 DIAGNOSIS — C109 Malignant neoplasm of oropharynx, unspecified: Secondary | ICD-10-CM | POA: Diagnosis not present

## 2023-04-22 DIAGNOSIS — R1310 Dysphagia, unspecified: Secondary | ICD-10-CM | POA: Diagnosis not present

## 2023-04-26 DIAGNOSIS — E119 Type 2 diabetes mellitus without complications: Secondary | ICD-10-CM | POA: Diagnosis not present

## 2023-04-26 DIAGNOSIS — G250 Essential tremor: Secondary | ICD-10-CM | POA: Diagnosis not present

## 2023-04-27 ENCOUNTER — Encounter: Payer: Self-pay | Admitting: Oncology

## 2023-04-27 ENCOUNTER — Ambulatory Visit: Payer: Medicare Other | Attending: Cardiology | Admitting: Cardiology

## 2023-04-27 ENCOUNTER — Encounter: Payer: Self-pay | Admitting: Cardiology

## 2023-04-27 VITALS — BP 100/60 | HR 69 | Ht 69.0 in | Wt 173.1 lb

## 2023-04-27 DIAGNOSIS — I5020 Unspecified systolic (congestive) heart failure: Secondary | ICD-10-CM

## 2023-04-27 DIAGNOSIS — I513 Intracardiac thrombosis, not elsewhere classified: Secondary | ICD-10-CM

## 2023-04-27 DIAGNOSIS — I493 Ventricular premature depolarization: Secondary | ICD-10-CM | POA: Diagnosis not present

## 2023-04-27 DIAGNOSIS — I2510 Atherosclerotic heart disease of native coronary artery without angina pectoris: Secondary | ICD-10-CM

## 2023-04-27 DIAGNOSIS — I255 Ischemic cardiomyopathy: Secondary | ICD-10-CM | POA: Diagnosis not present

## 2023-04-27 DIAGNOSIS — I4892 Unspecified atrial flutter: Secondary | ICD-10-CM | POA: Diagnosis not present

## 2023-04-27 MED ORDER — AMIODARONE HCL 200 MG PO TABS: ORAL_TABLET | ORAL | 3 refills | Status: DC

## 2023-04-27 NOTE — Progress Notes (Signed)
Electrophysiology Office Note:    Date:  04/27/2023   ID:  ELRY SLUYTER, DOB 1950/11/22, MRN [id number]  [facility] Cardiologist:  [name], MD  [facility] Electrophysiologist:  Lanier Prude, MD   Referring MD: Sondra Barges, PA-C   Chief Complaint: Frequent PVCs  History of Present Illness:    Kenneth Gilmore is a 72 y.o. malewho I am seeing today for an evaluation of frequent PVCs at the request of Eula Listen.  The patient was last seen by Alycia Rossetti on February 17, 2023.  The patient has a medical history that includes coronary artery disease with multiple PCI's, chronic systolic heart failure with mural thrombus, atrial flutter, diabetes, hypertension, hyperlipidemia, DVT on Xarelto previously (now off because of chemotherapy associated thrombocytopenia), PVCs, stage IVa oropharyngeal cancer with PEG tube.  He was admitted to [facility] in May of this year with a syncopal episode while in the shower.  He was found to have an NSTEMI and acute systolic heart failure complicated by mural thrombus.  He converted to sinus rhythm during the admission.  During that hospitalization he underwent complex PCI to the circumflex.  When the patient saw Alycia Rossetti in clinic he was maintaining sinus rhythm.  Relative hypotension was preventing safe escalation of beta-blocker dose.      Their past medical, social and family history was reveiwed.   ROS:   Please see the history of present illness.    All other systems reviewed and are negative.  EKGs/Labs/Other Studies Reviewed:    The following studies were reviewed today:  April 18, 2023 Limited echo personally reviewed EF 30 to 35% Normal RV Mild MR Contrast swirling in the left ventricular apex but no clear thrombus  Jan 03, 2023 echo personally reviewed Large LV thrombus EF 35-40  February 25, 2023 EKG shows sinus rhythm with frequent monomorphic PVCs.  PVCs have a steeply inferior axis and are positive throughout the precordium.   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    EKG Interpretation Date/Time:  Wednesday April 27 2023 11:31:55 EDT Ventricular Rate:  69 PR Interval:  136 QRS Duration:  78 QT Interval:  416 QTC Calculation: 445 R Axis:   9  Text Interpretation: Sinus rhythm with frequent Premature ventricular complexes in a pattern of bigeminy Low voltage QRS Confirmed by Steffanie Dunn [phone]) on 04/27/2023 11:44:31 AM    Physical Exam:    VS:  BP 100/60 (BP Location: Left Arm, Patient Position: Sitting, Cuff Size: Normal)   Pulse 69   Ht 5\' 9"  (1.753 m)   Wt 173 lb 2 oz (78.5 kg)   SpO2 98%   BMI 25.57 kg/m     Wt Readings from Last 3 Encounters:  04/27/23 173 lb 2 oz (78.5 kg)  04/13/23 176 lb (79.8 kg)  03/21/23 176 lb 12.8 oz (80.2 kg)     GEN: Chronically ill-appearing.  C-collar in place.  Poor dentition. CARDIAC: RRR, no murmurs, rubs, gallops RESPIRATORY:  Clear to auscultation without rales, wheezing or rhonchi       ASSESSMENT AND PLAN:    1. Frequent PVCs   2. Coronary artery disease involving native coronary artery of native heart without angina pectoris   3. Ischemic cardiomyopathy   4. Atrial flutter, unspecified type (HCC)   5. Mural thrombus of heart   6. HFrEF (heart failure with reduced ejection fraction) (HCC)     #Chronic systolic heart failure #Ischemic cardiomyopathy #Coronary artery disease The patient has NYHA class  II-III heart failure symptoms.  Severe coronary artery disease.  Persistently reduced EF despite revascularization and maximally tolerated GDMT.  I have discussed his cardiomyopathy and risk for sudden cardiac death associated with a cardiomyopathy during today's appointment.  I discussed the role of ICD therapy and its role in primary prevention of sudden cardiac death.  His situation is complex given his advanced head neck cancer, severe coronary artery disease with mural thrombus, malnutrition receiving tube feeds.  At  this time, I do not think he is an appropriate candidate for ICD therapy for these reasons.  I have recommended optimizing his GDMT.  #LV mural thrombus Apparent resolution on Eliquis Continue Eliquis  #Frequent PVCs/NSVT Patient with frequent monomorphic PVCs on prior EKG.  Focus appears to be at the aorto mitral continuity.  On maximally tolerated beta-blocker at this time.  His PVC burden was 40% on recent ZIO monitor. I have recommended adding amiodarone for suppression of his PVCs which may also help alleviate some of his lightheadedness/dizziness/presyncope.  There is also possible that by suppressing his PVC his EF may improve. He will start amiodarone 200 mg by mouth twice daily for 10 days followed by 200 mg by mouth daily.  I have discussed the risks associated with amiodarone in detail the patient and he wishes to proceed. Check TSH and free T4 today.  #Atrial flutter Has maintained sinus rhythm after atrial flutter diagnosed in hospital for NSTEMI Continue metoprolol and Eliquis    Follow-up 3 months with APP.  CMP, TSH and free T4 at that appointment.    Signed, Rossie Muskrat. Lalla Brothers, MD, [facility], [facility] 04/27/2023 11:44 AM    Electrophysiology  [facility]

## 2023-04-27 NOTE — Patient Instructions (Addendum)
Medication Instructions:  Your physician has recommended you make the following change in your medication:  1) START taking amiodarone 200 mg twice daily for 10 days, then decrease to 200 mg once daily thereafer *If you need a refill on your cardiac medications before your next appointment, please call your pharmacy*  Lab Work: TODAY: TSH, T4  Your provider would like for you to return in 8 weeks (around 10/04) to have the following labs drawn: .   Please go to [facility] [address] ([facility] [address], [place] [address] You do not need an appointment.  They are open from 7:30 am-4 pm.  Lunch from 1:00 pm- 2:00 pm You do NOT need to be fasting.  You may also go to any of these [facility] locations:  [employer]  - [address] [employer] - [address] ([facility])  Follow-Up: At [facility], you and your health needs are our priority.  As part of our continuing mission to provide you with exceptional heart care, we have created designated Provider Care Teams.  These Care Teams include your primary Cardiologist (physician) and Advanced Practice Providers (APPs -  Physician Assistants and Nurse Practitioners) who all work together to provide you with the care you need, when you need it.  Your next appointment:   3 months  Provider:   Sherie Don, NP

## 2023-04-28 DIAGNOSIS — Z85818 Personal history of malignant neoplasm of other sites of lip, oral cavity, and pharynx: Secondary | ICD-10-CM | POA: Diagnosis not present

## 2023-04-28 DIAGNOSIS — R1314 Dysphagia, pharyngoesophageal phase: Secondary | ICD-10-CM | POA: Diagnosis not present

## 2023-04-28 LAB — TSH: TSH: 1.43 u[IU]/mL (ref 0.450–4.500)

## 2023-04-28 LAB — T4, FREE: Free T4: 1.37 ng/dL (ref 0.82–1.77)

## 2023-04-29 ENCOUNTER — Other Ambulatory Visit: Payer: Self-pay

## 2023-04-30 ENCOUNTER — Other Ambulatory Visit: Payer: Self-pay

## 2023-05-18 HISTORY — PX: NECK SURGERY: SHX720

## 2023-05-20 ENCOUNTER — Ambulatory Visit: Payer: Medicare Other | Admitting: Physician Assistant

## 2023-05-27 ENCOUNTER — Other Ambulatory Visit: Payer: Self-pay

## 2023-05-27 DIAGNOSIS — I255 Ischemic cardiomyopathy: Secondary | ICD-10-CM | POA: Diagnosis not present

## 2023-05-27 DIAGNOSIS — I5020 Unspecified systolic (congestive) heart failure: Secondary | ICD-10-CM

## 2023-05-27 DIAGNOSIS — I493 Ventricular premature depolarization: Secondary | ICD-10-CM

## 2023-05-27 DIAGNOSIS — I2510 Atherosclerotic heart disease of native coronary artery without angina pectoris: Secondary | ICD-10-CM | POA: Diagnosis not present

## 2023-05-27 DIAGNOSIS — I513 Intracardiac thrombosis, not elsewhere classified: Secondary | ICD-10-CM

## 2023-05-27 DIAGNOSIS — I4892 Unspecified atrial flutter: Secondary | ICD-10-CM

## 2023-05-28 ENCOUNTER — Encounter: Payer: Self-pay | Admitting: Oncology

## 2023-05-28 LAB — COMPREHENSIVE METABOLIC PANEL
ALT: 172 [IU]/L — ABNORMAL HIGH (ref 0–44)
AST: 43 [IU]/L — ABNORMAL HIGH (ref 0–40)
Albumin: 3.4 g/dL — ABNORMAL LOW (ref 3.8–4.8)
Alkaline Phosphatase: 250 [IU]/L — ABNORMAL HIGH (ref 44–121)
BUN/Creatinine Ratio: 31 — ABNORMAL HIGH (ref 10–24)
BUN: 24 mg/dL (ref 8–27)
Bilirubin Total: 0.4 mg/dL (ref 0.0–1.2)
CO2: 25 mmol/L (ref 20–29)
Calcium: 9 mg/dL (ref 8.6–10.2)
Chloride: 99 mmol/L (ref 96–106)
Creatinine, Ser: 0.77 mg/dL (ref 0.76–1.27)
Globulin, Total: 2.6 g/dL (ref 1.5–4.5)
Glucose: 191 mg/dL — ABNORMAL HIGH (ref 70–99)
Potassium: 4.6 mmol/L (ref 3.5–5.2)
Sodium: 139 mmol/L (ref 134–144)
Total Protein: 6 g/dL (ref 6.0–8.5)
eGFR: 95 mL/min/{1.73_m2} (ref >59)

## 2023-05-28 LAB — TSH: TSH: 1.45 u[IU]/mL (ref 0.450–4.500)

## 2023-05-28 LAB — T4, FREE: Free T4: 1.62 ng/dL (ref 0.82–1.77)

## 2023-05-30 NOTE — Progress Notes (Unsigned)
Cardiology Office Note    Date:  05/31/2023   ID:  Kenneth Gilmore, DOB Jan 29, 1951, MRN [id number]  PCP:  [facility]  Cardiologist:  [name], MD  Electrophysiologist:  Lanier Prude, MD   Chief Complaint: Follow-up  History of Present Illness:   Kenneth Gilmore is a 72 y.o. 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  He was previously followed by Dr. Lady Gary, last seeing him in 04/2022.   He was admitted to [facility] in 12/2022 with a syncopal episode while in the shower, found to have NSTEMI, acute HFrEF complicated by mural thrombus, and new onset atrial flutter with RVR.  Episode of syncope was felt to be secondary to significant tachycardia in the setting of borderline low blood pressure with inability to exclude an arrhythmic event.  He converted to sinus rhythm during the admission.  On telemetry, he was having supraventricular and ventricular ectopy as well as runs of NSVT following conversion to sinus rhythm.  High-sensitivity troponin peaked at 3672.  Echo showed an EF of 35 to 40% with a 3.3 x 1.7 mural thrombus near the left ventricular apex, akinesis of mid apical anterolateral, anterior wall, apical, and apical segments, grade 1 diastolic dysfunction, normal RV systolic function with mildly large ventricular cavity size and normal PASP, trivial mitral regurgitation, indeterminate number of aortic valve cusps with  trivial insufficiency and sclerosis without evidence of stenosis, estimated right atrial pressure 3 mmHg.  R/LHC on 01/10/2023 showed heavily calcified coronary arteries with severe two-vessel CAD.  Chronically occluded mid LAD stent with faint left to left collaterals.  Severe in-stent restenosis of the mid LCx as well as severe calcified stenosis proximally.  The RCA had moderate nonobstructive disease.  RHC showed low filling pressures, normal pulmonary pressure and normal cardiac output.  He underwent successful complex angioplasty and 2 overlapping drug-eluting stents to the LCx extending into the ostium.  The procedure was overall difficult due to diffuse calcified disease and difficulty delivering stents.  An extension guide catheter had to be used with predilatation to high-pressure with a score flex balloon.  It was recommended to treat the remaining CAD medically.  During the admission, gastrostomy tube was placed.  Escalation of GDMT has been limited by relative hypotension.  He was seen in hospital follow-up in 01/2023 and was taking Lopressor 25 mg once daily rather than recommended to 12.5 twice daily.  Relative hypotension continue to preclude escalation of GDMT.  He remained on apixaban for mural thrombus and atrial flutter.  He was noted to have frequent PVCs on EKG.  He was advised to take Lopressor 12.5 mg twice daily.  We were contacted by oncology with reported heart rates in the 40s bpm.  EKG at that time showed sinus rhythm with a rate of 72 bpm with frequent PVCs in a pattern of ventricular bigeminy.  In this setting, he underwent a 3-day Zio patch which showed a predominant rhythm of sinus with an average rate of 77  bpm with 82 runs of NSVT lasting up to 6 beats, and very frequent PVCs with a burden of 40%.  In this setting, he was evaluated by Dr. Lalla Brothers on 04/27/2023 for evaluation of frequent PVCs in the context of relative hypotension preventing escalation of beta-blocker.  He was not felt  to be a candidate for ICD in the context of his comorbid conditions including advanced head/neck cancer, malnutrition receiving feeding tubes, and severe CAD with mural thrombus.  With regards to his PVC burden of 40% EP recommended addition of amiodarone.  Follow-up LFTs abnormal as outlined below.  He comes in today reporting a syncopal episode in 02/2023 at [facility].  He indicates he stood up quickly with associated sweating and took 1 step with subsequent syncopal episode.  He indicates he was out for 1 to 2 seconds.  He did not seek medical attention at that time.  [facility] to have a fractured C6 vertebrae.  He reports intracranial imaging showed no evidence of bleed.  He reports her carotid arteries were also evaluated and found to be without significant stenosis.  He has subsequently undergone surgical repair of C6 fracture.  He has been without further syncopal episodes.  This episode was not associated with chest pain, dyspnea, or palpitations.  No loss of bowel or bladder function.  No postictal state.  Not currently driving.  He reports adherence to cardiac medications and denies any symptoms concerning for bleeding.  Weight stable with mild stable lower extremity swelling.   Labs independently reviewed: 05/2023 - BUN 24, serum creatinine 0.77, potassium 4.6, albumin 3.4, AST 43, ALT 172, alk phos 250, TSH normal 03/2023 - Hgb 11.9, PLT 190, magnesium 2.1 12/2022 - TC 138, TG 84, HDL 29, LDL 92, A1c 7.5   Past Medical History:  Diagnosis Date   Diabetes mellitus without complication (HCC)    Hyperlipidemia    Hypertension    Oropharyngeal cancer (HCC)     Past Surgical History:  Procedure Laterality Date   arm surgery Left    CORONARY ANGIOPLASTY WITH STENT PLACEMENT     CORONARY STENT INTERVENTION N/A 01/10/2023   Procedure: CORONARY STENT INTERVENTION;  Surgeon: Iran Ouch, MD;  Location: [facility] INVASIVE CV LAB;  Service:  Cardiovascular;  Laterality: N/A;   IR GASTROSTOMY TUBE MOD SED  01/07/2023   left side rotator cuff surgery     NECK SURGERY  05/18/2023   RIGHT/LEFT HEART CATH AND CORONARY ANGIOGRAPHY N/A 01/10/2023   Procedure: RIGHT/LEFT HEART CATH AND CORONARY ANGIOGRAPHY;  Surgeon: Iran Ouch, MD;  Location: [facility] INVASIVE CV LAB;  Service: Cardiovascular;  Laterality: N/A;   SHOULDER SURGERY      Current Medications: Current Meds  Medication Sig   amiodarone (PACERONE) 200 MG tablet Take 1 tablet (200 mg total) by mouth 2 (two) times daily for 10 days, THEN 1 tablet (200 mg total) daily.   amoxicillin-clavulanate (AUGMENTIN) 200-28.5 MG/5ML suspension Place 15 mLs (600 mg total) into feeding tube 2 (two) times daily.   apixaban (ELIQUIS) 5 MG TABS tablet Place 1 tablet (5 mg total) into feeding tube 2 (two) times daily.   atorvastatin (LIPITOR) 80 MG tablet Place 1 tablet (80 mg total) into feeding tube daily.   clopidogrel (PLAVIX) 75 MG tablet Place 1 tablet (75 mg total) into feeding tube daily with breakfast.   empagliflozin (JARDIANCE) 10 MG TABS tablet Take 1 tablet (10 mg total) by mouth daily.  fluconazole (DIFLUCAN) 100 MG tablet Take 1 tablet (100 mg total) by mouth daily.   metFORMIN (GLUCOPHAGE) 500 MG tablet Take 500 mg by mouth 2 (two) times daily with a meal.   metoprolol tartrate (LOPRESSOR) 25 MG tablet Place 0.5 tablets (12.5 mg total) into feeding tube 2 (two) times daily.   Nutritional Supplements (FEEDING SUPPLEMENT, OSMOLITE 1.5 CAL,) LIQD Give 2 cartons at 8am, noon, 4pm feeding via feeding tube.  Give 1 carton at 8pm via feeding tube.  Flush with 60ml water before and after each feeding.  Flush with additional TID between feedings to better meet hydration needs. Send bolus supplies.   oxyCODONE (OXY IR/ROXICODONE) 5 MG immediate release tablet Place 1 tablet (5 mg total) into feeding tube every 6 (six) hours as needed for severe pain.   Protein (FEEDING SUPPLEMENT,  PROSOURCE TF20,) liquid Place 60 mLs into feeding tube 2 (two) times daily.   Water For Irrigation, Sterile (FREE WATER) SOLN Place 200 mLs into feeding tube 4 (four) times daily - after meals and at bedtime.    Allergies:   Patient has no known allergies.   Social History   Socioeconomic History   Marital status: Single    Spouse name: Not on file   Number of children: Not on file   Years of education: Not on file   Highest education level: Not on file  Occupational History   Occupation: retired  Tobacco Use   Smoking status: Former    Types: Cigarettes   Smokeless tobacco: Current    Types: Engineer, drilling   Vaping status: Never Used  Substance and Sexual Activity   Alcohol use: No   Drug use: Never   Sexual activity: Not Currently  Other Topics Concern   Not on file  Social History Narrative   Not on file   Social Determinants of Health   Financial Resource Strain: Low Risk  (12/27/2022)   Overall Financial Resource Strain (CARDIA)    Difficulty of Paying Living Expenses: Not hard at all  Food Insecurity: No Food Insecurity (01/06/2023)   Hunger Vital Sign    Worried About Running Out of Food in the Last Year: Never true    Ran Out of Food in the Last Year: Never true  Transportation Needs: No Transportation Needs (01/06/2023)   PRAPARE - Administrator, Civil Service (Medical): No    Lack of Transportation (Non-Medical): No  Physical Activity: Not on file  Stress: Stress Concern Present (12/27/2022)   [employer] of Occupational Health - Occupational Stress Questionnaire    Feeling of Stress : To some extent  Social Connections: Moderately Integrated (12/27/2022)   Social Connection and Isolation Panel [NHANES]    Frequency of Communication with Friends and Family: More than three times a week    Frequency of Social Gatherings with Friends and Family: More than three times a week    Attends Religious Services: More than 4 times per year    Active  Member of [employer] or Organizations: Yes    Attends Engineer, structural: More than 4 times per year    Marital Status: Never married     Family History:  The patient's family history includes Cancer in his father.  ROS:   12-point review of systems is negative unless otherwise noted in the HPI.   EKGs/Labs/Other Studies Reviewed:    Studies reviewed were summarized above. The additional studies were reviewed today:  Limited echo 04/18/2023: 1. Left ventricular  ejection fraction, by estimation, is 30 to 35%. The  left ventricle has moderate to severely decreased function. The left  ventricle demonstrates regional wall motion abnormalities (see scoring  diagram/findings for description). There  is severe hypokinesis of the left ventricular, mid-apical anteroseptal  wall, anterior wall and apical segment.   2. Right ventricular systolic function is normal.   3. The mitral valve is normal in structure. Mild mitral valve  regurgitation.  __________  [facility] patch 02/2023: Patient had a min HR of 68 bpm, max HR of 174 bpm, and avg HR of 77 bpm. Predominant underlying rhythm was Sinus Rhythm. 82 Ventricular Tachycardia runs occurred, the run with the fastest interval lasting 5 beats with a max rate of 174 bpm, the longest lasting 6 beats with an avg rate of 107 bpm.  Very frequent PVCs with a burden of 40%.  PVC count might be overestimated due to motion artifact. __________  [facility] 01/10/2023:   Ost RCA to Prox RCA lesion is 40% stenosed.   Mid RCA lesion is 40% stenosed.   RPAV lesion is 30% stenosed.   Mid LM to Dist LM lesion is 20% stenosed.   Mid LAD-2 lesion is 100% stenosed.   Mid LAD-1 lesion is 100% stenosed.   Lat 1st Diag lesion is 90% stenosed.   Prox Cx to Mid Cx lesion is 99% stenosed.   Ost Cx to Prox Cx lesion is 70% stenosed.   A drug-eluting stent was successfully placed using a STENT ONYX FRONTIER 2.25X38.   A drug-eluting stent was successfully placed using a  STENT ONYX FRONTIER 2.5X12.   Post intervention, there is a 0% residual stenosis.   Post intervention, there is a 0% residual stenosis.   1.  Heavily calcified coronary arteries with severe two-vessel coronary artery disease.  Chronically occluded mid LAD stent with faint left to left collaterals.  Severe in-stent restenosis of the mid left circumflex as well as severe calcified stenosis proximally.  The RCA has moderate nonobstructive disease. 2.  Left ventricular angiography was not performed.  EF was moderately reduced by echo. 3.  Right heart catheterization showed low filling pressures, normal pulmonary pressure and normal cardiac output. 4.  Successful complex angioplasty and 2 overlapped drug-eluting stent placement to the left circumflex extending to the ostium.  Difficult procedure due to diffuse calcified disease and difficulty delivering stents.  An extension guide catheter had to be used with predilation to high-pressure with a score flex balloon.   Recommendations: Treat the rest of coronary artery disease medically. The patient was loaded with clopidogrel which should be continued for at least 1 year.  Aspirin can be discontinued once the patient is started on oral anticoagulation for LV thrombus and atrial flutter.  For now, will resume heparin drip in 4 hours. __________   2D echo 01/03/2023: 1. There is a 3.3 x 1.7 cm mural thrombus near the left ventricular apex.  Left ventricular ejection fraction, by estimation, is 35 to 40%. The left  ventricle has moderately decreased function. The left ventricle  demonstrates regional wall motion  abnormalities (see scoring diagram/findings for description). Left  ventricular diastolic parameters are consistent with Grade I diastolic  dysfunction (impaired relaxation). There is akinesis of the left  ventricular, mid-apical anterior wall and  anteroseptal wall. There is akinesis of the left ventricular, apical  inferior segment. There is  akinesis of the left ventricular, apical  segment.   2. Right ventricular systolic function is normal. The right ventricular  size  is mildly enlarged. There is normal pulmonary artery systolic  pressure.   3. The mitral valve is abnormal. Trivial mitral valve regurgitation. No  evidence of mitral stenosis.   4. The aortic valve has an indeterminant number of cusps. There is  moderate thickening of the aortic valve. Aortic valve regurgitation is  trivial. Aortic valve sclerosis/calcification is present, without any  evidence of aortic stenosis.   5. The inferior vena cava is normal in size with greater than 50%  respiratory variability, suggesting right atrial pressure of 3 mmHg.    EKG:  EKG is ordered today.  The EKG ordered today demonstrates NSR, 70 bpm, occasional PVCs with rare ventricular couplets, low voltage QRS, prior inferior infarct, nonspecific ST-T changes, when compared to prior tracing PVC burden is slightly improved  Recent Labs: 04/06/2023: Hemoglobin 11.9; Magnesium 2.1; Platelet Count 190 05/27/2023: ALT 172; BUN 24; Creatinine, Ser 0.77; Potassium 4.6; Sodium 139; TSH 1.450  Recent Lipid Panel    Component Value Date/Time   CHOL 138 01/05/2023 0530   TRIG 84 01/05/2023 0530   HDL 29 (L) 01/05/2023 0530   CHOLHDL 4.8 01/05/2023 0530   VLDL 17 01/05/2023 0530   LDLCALC 92 01/05/2023 0530    PHYSICAL EXAM:    VS:  BP 110/60 (BP Location: Left Arm, Patient Position: Sitting, Cuff Size: Normal)   Pulse 70   Ht 5\' 9"  (1.753 m)   Wt 179 lb 6.4 oz (81.4 kg)   BMI 26.49 kg/m   BMI: Body mass index is 26.49 kg/m.  Physical Exam Vitals reviewed.  Constitutional:      Appearance: He is well-developed.  HENT:     Head: Normocephalic and atraumatic.  Eyes:     General:        Right eye: No discharge.        Left eye: No discharge.  Neck:     Vascular: No JVD.  Cardiovascular:     Rate and Rhythm: Normal rate and regular rhythm. Occasional Extrasystoles are  present.    Heart sounds: Normal heart sounds, S1 normal and S2 normal. Heart sounds not distant. No midsystolic click and no opening snap. No murmur heard.    No friction rub.  Pulmonary:     Effort: Pulmonary effort is normal. No respiratory distress.     Breath sounds: Normal breath sounds. No decreased breath sounds, wheezing, rhonchi or rales.  Chest:     Chest wall: No tenderness.  Abdominal:     General: There is no distension.  Musculoskeletal:     Cervical back: Normal range of motion.     Right lower leg: Edema present.     Left lower leg: Edema present.     Comments: Trivial bilateral pretibial edema to the midshin.  Skin:    General: Skin is warm and dry.     Nails: There is no clubbing.  Neurological:     Mental Status: He is alert and oriented to person, place, and time.  Psychiatric:        Speech: Speech normal.        Behavior: Behavior normal.        Thought Content: Thought content normal.        Judgment: Judgment normal.     Wt Readings from Last 3 Encounters:  05/31/23 179 lb 6.4 oz (81.4 kg)  04/27/23 173 lb 2 oz (78.5 kg)  04/13/23 176 lb (79.8 kg)     ASSESSMENT & PLAN:   Syncope: He reports  an episode of frank [place] after [place] up quickly leading to fractured C6 vertebrae requiring surgical repair.  It appears this episode may have been in the setting of orthostasis, though cannot exclude malignant arrhythmia.  Given the patient's cardiomyopathy, frequent PVC burden with runs of NSVT, we will have him evaluated by EP to assess if further intervention is indicated at this time.  For now, no driving.  Patient's sister drove him to today's appointment.  He reports neurovascular imaging was completed at the [facility] health system as part of his syncope workup.  We do not have these available for review at this time.  CAD involving the native coronary arteries without angina: He is without symptoms of angina or cardiac  decompensation.  Continue clopidogrel with apixaban (not on aspirin in an effort to avoid triple therapy given need for [facility] with atrial flutter and mural thrombus) for at least 12 months, dating back to date of PCI (01/10/2023).  Continue aggressive risk factor modification and secondary prevention including metoprolol and atorvastatin.  Previously referred to cardiac rehab.  HFrEF secondary to ICM: Euvolemic and well compensated with NYHA class difficult to assess secondary to limited functional status in the setting of his comorbid conditions.  Relative hypotension continues to preclude escalation of GDMT.  He has been managed with Lopressor over carvedilol given relative hypotension and over Toprol-XL given G-tube.  Not requiring a standing loop diuretic.  Remains on Jardiance.  Anticipate scheduling echo in follow-up to evaluate for improvement in his cardiomyopathy following PCI and maximally tolerated GDMT.  Mural thrombus: Remains on apixaban 5 mg twice daily.  Though this is off label use of DOAC, it has been felt that warfarin is not a good option for anticoagulation given his underlying comorbid conditions with active malignancy and baseline anemia/thrombocytopenia.  Anticipate repeating echo at next visit to evaluate for resolution of LV thrombus.  Nonetheless, he will need indefinite anticoagulation given atrial flutter.  Monitoring of hemoglobin and platelet count is being done closely through hematology/oncology.  Atrial flutter: Maintaining sinus rhythm on Lopressor 12.5 mg twice daily, now on amiodarone as outlined below.  CHA2DS2-VASc at least 5.  Remains on apixaban 5 mg twice daily and does not meet reduced dosing criteria.  No symptoms concerning for bleeding.  Labs monitored through oncology/hematology.  Frequent PVCs: Very frequent PVC burden of 40% on recent outpatient cardiac monitoring.  Placed on amiodarone by EP.  Follow-up surveillance labs showed abnormal LFT with normal TSH.   Patient scheduled to see EP on 10/9 for further recommendations of this and syncope.  Potassium and magnesium at goal.  Abnormal LFT: Possibly in the setting of recent amiodarone usage.  Patient to follow-up with EP this week.  HTN: Blood pressure stable.  Medical therapy as outlined above.  HLD: LDL 92 in 12/2022 with goal being less than 55.  Anticipate fasting lipid panel at next visit with recommendation to escalate lipid-lowering therapy as indicated to achieve target LDL.  History of DVT: Remains on apixaban as outlined above.  Oropharyngeal cancer status post gastrostomy tube following chemoradiation with concurrent anemia and intermittent thrombocytopenia: Ongoing management per oncology.     Disposition: F/u with Dr. Kirke Corin or an APP after EP.   Medication Adjustments/Labs and Tests Ordered: Current medicines are reviewed at length with the patient today.  Concerns regarding medicines are outlined above. Medication changes, Labs and Tests ordered today are summarized above and listed in the Patient Instructions accessible in Encounters.  Signed, Eula Listen, PA-C 05/31/2023 5:10 PM     [facility] [place] [address] [place], [place] [address] [phone]

## 2023-05-31 ENCOUNTER — Ambulatory Visit: Payer: Medicare Other | Attending: Physician Assistant | Admitting: Physician Assistant

## 2023-05-31 ENCOUNTER — Encounter: Payer: Self-pay | Admitting: Physician Assistant

## 2023-05-31 VITALS — BP 110/60 | HR 70 | Ht 69.0 in | Wt 179.4 lb

## 2023-05-31 DIAGNOSIS — R7989 Other specified abnormal findings of blood chemistry: Secondary | ICD-10-CM

## 2023-05-31 DIAGNOSIS — I255 Ischemic cardiomyopathy: Secondary | ICD-10-CM

## 2023-05-31 DIAGNOSIS — I513 Intracardiac thrombosis, not elsewhere classified: Secondary | ICD-10-CM | POA: Diagnosis not present

## 2023-05-31 DIAGNOSIS — I493 Ventricular premature depolarization: Secondary | ICD-10-CM | POA: Diagnosis not present

## 2023-05-31 DIAGNOSIS — I10 Essential (primary) hypertension: Secondary | ICD-10-CM

## 2023-05-31 DIAGNOSIS — I5020 Unspecified systolic (congestive) heart failure: Secondary | ICD-10-CM

## 2023-05-31 DIAGNOSIS — I2510 Atherosclerotic heart disease of native coronary artery without angina pectoris: Secondary | ICD-10-CM

## 2023-05-31 DIAGNOSIS — C109 Malignant neoplasm of oropharynx, unspecified: Secondary | ICD-10-CM

## 2023-05-31 DIAGNOSIS — R55 Syncope and collapse: Secondary | ICD-10-CM | POA: Diagnosis not present

## 2023-05-31 DIAGNOSIS — Z86718 Personal history of other venous thrombosis and embolism: Secondary | ICD-10-CM | POA: Diagnosis not present

## 2023-05-31 DIAGNOSIS — E785 Hyperlipidemia, unspecified: Secondary | ICD-10-CM | POA: Diagnosis not present

## 2023-05-31 DIAGNOSIS — D649 Anemia, unspecified: Secondary | ICD-10-CM

## 2023-05-31 DIAGNOSIS — I4892 Unspecified atrial flutter: Secondary | ICD-10-CM | POA: Diagnosis not present

## 2023-05-31 NOTE — Patient Instructions (Signed)
Medication Instructions:  Your Physician recommend you continue on your current medication as directed.    *If you need a refill on your cardiac medications before your next appointment, please call your pharmacy*   Follow-Up: At [facility], you and your health needs are our priority.  As part of our continuing mission to provide you with exceptional heart care, we have created designated Provider Care Teams.  These Care Teams include your primary Cardiologist (physician) and Advanced Practice Providers (APPs -  Physician Assistants and Nurse Practitioners) who all work together to provide you with the care you need, when you need it.  We recommend signing up for the patient portal called "MyChart".  Sign up information is provided on this After Visit Summary.  MyChart is used to connect with patients for Virtual Visits (Telemedicine).  Patients are able to view lab/test results, encounter notes, upcoming appointments, etc.  Non-urgent messages can be sent to your provider as well.   To learn more about what you can do with MyChart, go to [email or web address].    Your next appointment:   1 month(s)  Provider:   You may see [name], MD or one of the following Advanced Practice Providers on your designated Care Team:   Nicolasa Ducking, NP Eula Listen, PA-C Cadence Fransico Michael, PA-C Charlsie Quest, NP

## 2023-06-01 ENCOUNTER — Ambulatory Visit: Payer: Medicare Other | Attending: Cardiology | Admitting: Cardiology

## 2023-06-01 ENCOUNTER — Encounter: Payer: Self-pay | Admitting: Cardiology

## 2023-06-01 VITALS — BP 120/70 | HR 66 | Ht 69.0 in | Wt 178.1 lb

## 2023-06-01 DIAGNOSIS — R7989 Other specified abnormal findings of blood chemistry: Secondary | ICD-10-CM

## 2023-06-01 DIAGNOSIS — I493 Ventricular premature depolarization: Secondary | ICD-10-CM | POA: Diagnosis not present

## 2023-06-01 DIAGNOSIS — I513 Intracardiac thrombosis, not elsewhere classified: Secondary | ICD-10-CM | POA: Diagnosis not present

## 2023-06-01 DIAGNOSIS — I255 Ischemic cardiomyopathy: Secondary | ICD-10-CM

## 2023-06-01 DIAGNOSIS — I5020 Unspecified systolic (congestive) heart failure: Secondary | ICD-10-CM | POA: Diagnosis not present

## 2023-06-01 DIAGNOSIS — I4892 Unspecified atrial flutter: Secondary | ICD-10-CM | POA: Diagnosis not present

## 2023-06-01 NOTE — Patient Instructions (Signed)
Medication Instructions:  Your physician has recommended you make the following change in your medication:  1) STOP taking amiodarone  *If you need a refill on your cardiac medications before your next appointment, please call your pharmacy*  Lab Work: CMET prior to your next appointment  Follow-Up: At [facility], you and your health needs are our priority.  As part of our continuing mission to provide you with exceptional heart care, we have created designated Provider Care Teams.  These Care Teams include your primary Cardiologist (physician) and Advanced Practice Providers (APPs -  Physician Assistants and Nurse Practitioners) who all work together to provide you with the care you need, when you need it.  Your next appointment:   8 weeks  Provider:   Sherie Don, NP

## 2023-06-01 NOTE — Progress Notes (Signed)
Electrophysiology Office Follow up Visit Note:    Date:  06/01/2023   ID:  AAHAAN JOSEY, DOB [birth year], MRN [id number]  PCP:  [facility]  [facility] Cardiologist:  [name], MD  [facility] Electrophysiologist:  Lanier Prude, MD    Interval History:    Kenneth Gilmore is a 72 y.o. male who presents for a follow up visit.   I last saw the patient April 27, 2023 for frequent PVCs.  The patient has systolic heart failure, ischemic cardiomyopathy, coronary artery disease, LV mural thrombus, PVCs/NSVT on monitoring and atrial flutter.  At the last appointment amiodarone was started for PVC/NSVT suppression and he was to follow-up in 3 months. He was seen by Eula Listen in clinic May 31, 2023.      Past medical, surgical, social and family history were reviewed.  ROS:   Please see the history of present illness.    All other systems reviewed and are negative.  EKGs/Labs/Other Studies Reviewed:    The following studies were reviewed today:  He is doing well today.  He is out of his c-collar.  He gets his stitches removed from his neck incision tomorrow.  No syncopal episodes.  No abdominal pain.  No nausea or vomiting.       Physical Exam:    VS:  BP 120/70 (BP Location: Left Arm, Patient Position: Sitting, Cuff Size: Normal)   Pulse 66   Ht 5\' 9"  (1.753 m)   Wt 178 lb 2 oz (80.8 kg)   SpO2 97%   BMI 26.30 kg/m     Wt Readings from Last 3 Encounters:  06/01/23 178 lb 2 oz (80.8 kg)  05/31/23 179 lb 6.4 oz (81.4 kg)  04/27/23 173 lb 2 oz (78.5 kg)     GEN:  Well nourished, well developed in no acute distress CARDIAC: RRR, no murmurs, rubs, gallops RESPIRATORY:  Clear to auscultation without rales, wheezing or rhonchi       ASSESSMENT:    1. HFrEF (heart failure with reduced ejection fraction) (HCC)   2. Ischemic cardiomyopathy   3. Atrial flutter, unspecified type (HCC)   4. Abnormal LFTs   5. Mural thrombus of heart   6.  Frequent PVCs    PLAN:    In order of problems listed above:   #Chronic systolic heart failure #Ischemic cardiomyopathy #Coronary artery disease NYHA class II-III.  Persistently reduced EF despite revascularization.  He is not a candidate for ICD.  This has been discussed during previous appointments.  #Frequent PVCs/NSVT. Burden 40% on recent ZIO monitor.  Amiodarone was started at the September 4 appointment but unfortunately, lab work showed elevated ALT and alkaline phosphatase.  Unclear if this is related to the amiodarone.  I will have him stop the amiodarone and follow-up with his primary care physician/GI.  I will recheck CMP and 8 weeks on the send is an appointment with Rosalita Chessman in clinic.  #LV mural thrombus Continue Eliquis  #Atrial flutter Continue Eliquis   Follow-up with primary care in the next 1 to 2 weeks  Follow-up with EP APP in 8 weeks  Signed, Steffanie Dunn, MD, [facility], [facility] 06/01/2023 4:05 PM    Electrophysiology [facility]

## 2023-06-03 ENCOUNTER — Inpatient Hospital Stay: Payer: Medicare Other | Attending: Oncology

## 2023-06-03 NOTE — Progress Notes (Signed)
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    Spoke with patient via phone.  Reports that he is wearing brace around neck following surgery 4 weeks ago.  Continues to give 12 oz of osmolite 1.5 4 times a day.  Giving 1 3/4 -2 cups water with each feeding (QID).   Having bowel movement about every other day.  Denies problems tolerating tube feeding.     Medications: reviewed  Labs: Na 133, BUN 24, creatinine 0.77, glucose 191 (10/4).  Anthropometrics:   Weight 178 lb 2 oz on 10/9 176 lb on 8/21 176 lb on 6/27 168 lb on 5/31 190 lb on 4/25   Estimated Energy Needs  Kcals: 2150-2580 Protein: [phone] g Fluid: 2150-2580 ml  NUTRITION DIAGNOSIS: Severe malnutrition improved   MALNUTRITION DIAGNOSIS: Severe malnutrition improved   INTERVENTION:  Continue osmolite 1.5, 6 cartons a day via feeding tube.  Flush with 1 3/4-2 cups water at each feeding (QID)  Provides 2130 calories, 89 g protein and 3000 ml free water.   Patient states that his tube feeding supplies are now coming from the [place].    MONITORING, EVALUATION, GOAL: weight trends, tube feeding tolerance   NEXT VISIT: Wednesday, Dec 4 after MD visit  Jawanna Dykman B. Freida Busman, RD, LDN Registered Dietitian [phone]

## 2023-06-09 DIAGNOSIS — R1314 Dysphagia, pharyngoesophageal phase: Secondary | ICD-10-CM | POA: Diagnosis not present

## 2023-06-09 DIAGNOSIS — Z85818 Personal history of malignant neoplasm of other sites of lip, oral cavity, and pharynx: Secondary | ICD-10-CM | POA: Diagnosis not present

## 2023-06-18 DIAGNOSIS — C109 Malignant neoplasm of oropharynx, unspecified: Secondary | ICD-10-CM | POA: Diagnosis not present

## 2023-06-18 DIAGNOSIS — R1310 Dysphagia, unspecified: Secondary | ICD-10-CM | POA: Diagnosis not present

## 2023-06-18 DIAGNOSIS — R252 Cramp and spasm: Secondary | ICD-10-CM | POA: Diagnosis not present

## 2023-06-18 DIAGNOSIS — Z931 Gastrostomy status: Secondary | ICD-10-CM | POA: Diagnosis not present

## 2023-06-24 NOTE — Progress Notes (Signed)
Cardiology Office Note    Date:  07/01/2023   ID:  Kenneth Gilmore, DOB 03-Nov-1950, MRN [id number]  PCP:  [facility]  Cardiologist:  [name], MD  Electrophysiologist:  Lanier Prude, MD   Chief Complaint: Follow up  History of Present Illness:   Kenneth Gilmore is a 72 y.o. 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   He was previously followed by Dr. Lady Gary, last seeing him in 04/2022.   He was admitted to [facility] in 12/2022 with a syncopal episode while in the shower, found to have NSTEMI, acute HFrEF complicated by mural thrombus, and new onset atrial flutter with RVR.  Episode of syncope was felt to be secondary to significant tachycardia in the setting of borderline low blood pressure with inability to exclude an arrhythmic event.  He converted to sinus rhythm during the admission.  On telemetry, he was having supraventricular and ventricular ectopy as well as runs of NSVT following conversion to sinus rhythm.  High-sensitivity troponin peaked at 3672.  Echo showed an EF of 35 to 40% with a 3.3 x 1.7 mural thrombus near the left ventricular apex, akinesis of mid apical anterolateral, anterior wall, apical, and apical segments, grade 1 diastolic dysfunction, normal RV systolic function with mildly large ventricular cavity size and normal PASP, trivial mitral regurgitation, indeterminate number of aortic valve cusps with  trivial insufficiency and sclerosis without evidence of stenosis, estimated right atrial pressure 3 mmHg.  R/LHC on 01/10/2023 showed heavily calcified coronary arteries with severe two-vessel CAD.  Chronically occluded mid LAD stent with faint left to left collaterals.  Severe in-stent restenosis of the mid LCx as well as severe calcified stenosis proximally.  The RCA had moderate nonobstructive disease.  RHC showed low filling pressures, normal pulmonary pressure and normal cardiac output.  He underwent successful complex angioplasty and 2 overlapping drug-eluting stents to the LCx extending into the ostium.  The procedure was overall difficult due to diffuse calcified disease and difficulty delivering stents.  An extension guide catheter had to be used with predilatation to high-pressure with a score flex balloon.  It was recommended to treat the remaining CAD medically.  During the admission, gastrostomy tube was placed.  Escalation of GDMT has been limited by relative hypotension.  He was seen in hospital follow-up in 01/2023 and was taking Lopressor 25 mg once daily rather than recommended to 12.5 twice daily.  Relative hypotension continued to preclude escalation of GDMT.  He remained on apixaban for mural thrombus and atrial flutter.  He was noted to have frequent PVCs on EKG.  He was advised to take Lopressor 12.5 mg twice daily.  We were contacted by oncology with reported heart rates in the 40s bpm.  EKG at that time showed sinus rhythm with a rate of 72 bpm with frequent PVCs in a pattern of ventricular bigeminy.  In this setting, he underwent a 3-day Zio patch which showed a predominant rhythm of sinus with an average rate  of 77 bpm with 82 runs of NSVT lasting up to 6 beats, and very frequent PVCs with a burden of 40%.  In this setting, he was evaluated by Dr. Lalla Brothers on 04/27/2023 for evaluation of frequent PVCs in the context of relative hypotension preventing escalation of beta-blocker.  He was not felt  to be a candidate for ICD in the context of his comorbid conditions including advanced head/neck cancer, malnutrition receiving feeding tubes, and severe CAD with mural thrombus.  With regards to his PVC burden of 40% EP recommended addition of amiodarone.  Follow-up LFTs abnormal as outlined below.  He was seen by general cardiology on 05/31/2023 and reported a syncopal episode in 02/2023 while at [facility].  He indicated he stood up quickly with associated sweating, took 1 step, and suffered LOC.  He indicates he was without consciousness for 1 to 2 seconds.  He did not seek medical attention at that time.  [facility] to have a [facility] C6 vertebrae with subsequent surgical repair.  He reported intracranial imaging showed no evidence of bleed.  He also reported carotid arteries were evaluated and found to be without significant stenosis.  He was without further syncopal episodes.  The episode was without symptoms of chest pain, palpitations, or dyspnea.  No loss of bowel or bladder function.  Relative hypotension continued to preclude escalation of GDMT.  In the setting of the above syncopal episode, it was recommended he be reevaluated by EP.  He was seen by Dr. Lalla Brothers on 06/01/2023 who was again not felt to be a candidate for ICD.  Amiodarone was discontinued given abnormal LFTs with recommendation for patient to follow-up with PCP/GI and for the patient to follow up with EP in 07/2023.   He comes in doing well from a cardiac perspective and is without symptoms of angina or cardiac decompensation.  No dyspnea or palpitations.  No dizziness, presyncope, or syncope.  No lower extremity swelling, abdominal distention, or progressive orthopnea.  Continues to wear a hard cervical collar and is hopeful that he will be able to come out of this later next week.  No falls or symptoms concerning for bleeding.  Adherent and tolerating cardiac medications.  His weight is  down 6 pounds today by our scale when compared to his visit on 05/31/2023.  Overall, he feels well from a cardiac perspective and does not have any acute cardiac concerns at this time.   Labs independently reviewed: 05/2023 - BUN 24, serum creatinine 0.77, potassium 4.6, albumin 3.4, AST 43, ALT 172, alk phos 250, TSH normal 03/2023 - Hgb 11.9, PLT 190, magnesium 2.1 12/2022 - TC 138, TG 84, HDL 29, LDL 92, A1c 7.5   Past Medical History:  Diagnosis Date   CAD (coronary artery disease)    Diabetes mellitus without complication (HCC)    DVT (deep venous thrombosis) (HCC)    Frequent PVCs    HFrEF (heart failure with reduced ejection fraction) (HCC)    Hyperlipidemia    Hypertension    Mural thrombus of cardiac apex    NSVT (nonsustained ventricular tachycardia) (HCC)    Oropharyngeal cancer (HCC)     Past Surgical History:  Procedure Laterality Date   arm surgery Left    CORONARY ANGIOPLASTY WITH STENT PLACEMENT     CORONARY STENT INTERVENTION N/A 01/10/2023   Procedure: CORONARY STENT INTERVENTION;  Surgeon: Iran Ouch, MD;  Location: [facility] INVASIVE CV LAB;  Service: Cardiovascular;  Laterality: N/A;   IR GASTROSTOMY TUBE MOD SED  01/07/2023   left side rotator cuff surgery     NECK SURGERY  05/18/2023   RIGHT/LEFT HEART CATH AND CORONARY ANGIOGRAPHY N/A 01/10/2023   Procedure: RIGHT/LEFT HEART CATH AND CORONARY ANGIOGRAPHY;  Surgeon: Iran Ouch, MD;  Location: [facility] INVASIVE CV LAB;  Service: Cardiovascular;  Laterality: N/A;   SHOULDER SURGERY      Current Medications: Current Meds  Medication Sig   apixaban (ELIQUIS) 5 MG TABS tablet Place 1 tablet (5 mg total) into feeding tube 2 (two) times daily.   atorvastatin (LIPITOR) 80 MG tablet Place 1 tablet (80 mg total) into feeding tube daily.   clopidogrel (PLAVIX) 75 MG tablet Place 1 tablet (75 mg total) into feeding tube daily with breakfast.   empagliflozin (JARDIANCE) 10 MG TABS tablet Take 1 tablet (10 mg  total) by mouth daily.   Nutritional Supplements (FEEDING SUPPLEMENT, OSMOLITE 1.5 CAL,) LIQD Give 2 cartons at 8am, noon, 4pm feeding via feeding tube.  Give 1 carton at 8pm via feeding tube.  Flush with 60ml water before and after each feeding.  Flush with additional TID between feedings to better meet hydration needs. Send bolus supplies.   Protein (FEEDING SUPPLEMENT, PROSOURCE TF20,) liquid Place 60 mLs into feeding tube 2 (two) times daily.   Water For Irrigation, Sterile (FREE WATER) SOLN Place 200 mLs into feeding tube 4 (four) times daily - after meals and at bedtime.    Allergies:   Patient has no known allergies.   Social History   Socioeconomic History   Marital status: Single    Spouse name: Not on file   Number of children: Not on file   Years of education: Not on file   Highest education level: Not on file  Occupational History   Occupation: retired  Tobacco Use   Smoking status: Former    Types: Cigarettes   Smokeless tobacco: Current    Types: Engineer, drilling   Vaping status: Never Used  Substance and Sexual Activity   Alcohol use: No   Drug use: Never   Sexual activity: Not Currently  Other Topics Concern   Not on file  Social History Narrative   Not on file   Social Determinants of Health   Financial Resource Strain: Low Risk  (12/27/2022)   Overall Financial Resource Strain (CARDIA)    Difficulty of Paying Living Expenses: Not hard at all  Food Insecurity: No Food Insecurity (01/06/2023)   Hunger Vital Sign    Worried About Running Out of Food in the Last Year: Never true    Ran Out of Food in the Last Year: Never true  Transportation Needs: No Transportation Needs (01/06/2023)   PRAPARE - Administrator, Civil Service (Medical): No    Lack of Transportation (Non-Medical): No  Physical Activity: Not on file  Stress: Stress Concern Present (12/27/2022)   [employer] of Occupational Health - Occupational Stress Questionnaire     Feeling of Stress : To some extent  Social Connections: Moderately Integrated (12/27/2022)   Social Connection and Isolation Panel [NHANES]    Frequency of Communication with Friends and Family: More than three times a week    Frequency of Social Gatherings with Friends and Family: More than three times a week    Attends Religious Services: More than 4 times per year    Active Member of [employer] or Organizations: Yes    Attends Banker Meetings:  More than 4 times per year    Marital Status: Never married     Family History:  The patient's family history includes Cancer in his father.  ROS:   12-point review of systems is negative unless otherwise noted in the HPI.   EKGs/Labs/Other Studies Reviewed:    Studies reviewed were summarized above. The additional studies were reviewed today:  Limited echo 04/18/2023: 1. Left ventricular ejection fraction, by estimation, is 30 to 35%. The  left ventricle has moderate to severely decreased function. The left  ventricle demonstrates regional wall motion abnormalities (see scoring  diagram/findings for description). There  is severe hypokinesis of the left ventricular, mid-apical anteroseptal  wall, anterior wall and apical segment.   2. Right ventricular systolic function is normal.   3. The mitral valve is normal in structure. Mild mitral valve  regurgitation.  __________   [facility] patch 02/2023: Patient had a min HR of 68 bpm, max HR of 174 bpm, and avg HR of 77 bpm. Predominant underlying rhythm was Sinus Rhythm. 82 Ventricular Tachycardia runs occurred, the run with the fastest interval lasting 5 beats with a max rate of 174 bpm, the longest lasting 6 beats with an avg rate of 107 bpm.  Very frequent PVCs with a burden of 40%.  PVC count might be overestimated due to motion artifact. __________   [facility] 01/10/2023:   Ost RCA to Prox RCA lesion is 40% stenosed.   Mid RCA lesion is 40% stenosed.   RPAV lesion is 30% stenosed.   Mid  LM to Dist LM lesion is 20% stenosed.   Mid LAD-2 lesion is 100% stenosed.   Mid LAD-1 lesion is 100% stenosed.   Lat 1st Diag lesion is 90% stenosed.   Prox Cx to Mid Cx lesion is 99% stenosed.   Ost Cx to Prox Cx lesion is 70% stenosed.   A drug-eluting stent was successfully placed using a STENT ONYX FRONTIER 2.25X38.   A drug-eluting stent was successfully placed using a STENT ONYX FRONTIER 2.5X12.   Post intervention, there is a 0% residual stenosis.   Post intervention, there is a 0% residual stenosis.   1.  Heavily calcified coronary arteries with severe two-vessel coronary artery disease.  Chronically occluded mid LAD stent with faint left to left collaterals.  Severe in-stent restenosis of the mid left circumflex as well as severe calcified stenosis proximally.  The RCA has moderate nonobstructive disease. 2.  Left ventricular angiography was not performed.  EF was moderately reduced by echo. 3.  Right heart catheterization showed low filling pressures, normal pulmonary pressure and normal cardiac output. 4.  Successful complex angioplasty and 2 overlapped drug-eluting stent placement to the left circumflex extending to the ostium.  Difficult procedure due to diffuse calcified disease and difficulty delivering stents.  An extension guide catheter had to be used with predilation to high-pressure with a score flex balloon.   Recommendations: Treat the rest of coronary artery disease medically. The patient was loaded with clopidogrel which should be continued for at least 1 year.  Aspirin can be discontinued once the patient is started on oral anticoagulation for LV thrombus and atrial flutter.  For now, will resume heparin drip in 4 hours. __________   2D echo 01/03/2023: 1. There is a 3.3 x 1.7 cm mural thrombus near the left ventricular apex.  Left ventricular ejection fraction, by estimation, is 35 to 40%. The left  ventricle has moderately decreased function. The left ventricle   demonstrates regional wall  motion  abnormalities (see scoring diagram/findings for description). Left  ventricular diastolic parameters are consistent with Grade I diastolic  dysfunction (impaired relaxation). There is akinesis of the left  ventricular, mid-apical anterior wall and  anteroseptal wall. There is akinesis of the left ventricular, apical  inferior segment. There is akinesis of the left ventricular, apical  segment.   2. Right ventricular systolic function is normal. The right ventricular  size is mildly enlarged. There is normal pulmonary artery systolic  pressure.   3. The mitral valve is abnormal. Trivial mitral valve regurgitation. No  evidence of mitral stenosis.   4. The aortic valve has an indeterminant number of cusps. There is  moderate thickening of the aortic valve. Aortic valve regurgitation is  trivial. Aortic valve sclerosis/calcification is present, without any  evidence of aortic stenosis.   5. The inferior vena cava is normal in size with greater than 50%  respiratory variability, suggesting right atrial pressure of 3 mmHg.    EKG:  EKG is ordered today.  The EKG ordered today demonstrates NSR, 66 bpm, frequent PVCs in a pattern of ventricular bigeminy, and no acute ST-T changes, consistent with prior tracing  Recent Labs: 04/06/2023: Hemoglobin 11.9; Magnesium 2.1; Platelet Count 190 05/27/2023: ALT 172; BUN 24; Creatinine, Ser 0.77; Potassium 4.6; Sodium 139; TSH 1.450  Recent Lipid Panel    Component Value Date/Time   CHOL 138 01/05/2023 0530   TRIG 84 01/05/2023 0530   HDL 29 (L) 01/05/2023 0530   CHOLHDL 4.8 01/05/2023 0530   VLDL 17 01/05/2023 0530   LDLCALC 92 01/05/2023 0530    PHYSICAL EXAM:    VS:  BP 106/60 (BP Location: Left Arm, Patient Position: Sitting, Cuff Size: Normal)   Pulse 66   Ht 5\' 9"  (1.753 m)   Wt 165 lb 9.6 oz (75.1 kg)   SpO2 98%   BMI 24.45 kg/m   BMI: Body mass index is 24.45 kg/m.  Physical Exam  Wt Readings  from Last 3 Encounters:  07/01/23 165 lb 9.6 oz (75.1 kg)  06/01/23 178 lb 2 oz (80.8 kg)  05/31/23 179 lb 6.4 oz (81.4 kg)     ASSESSMENT & PLAN:   CAD involving the native coronary arteries without angina: He is without symptoms of angina or cardiac decompensation.  Continue aggressive risk factor modification and secondary prevention including clopidogrel with apixaban (not on aspirin in an effort to avoid triple therapy given need for [facility] with atrial flutter and mural thrombus) for at least 12 months dating back to date of PCI (01/10/2023).  He will otherwise continue metoprolol to tartrate 12.5 mg twice daily and atorvastatin 80 mg.  No indication for further ischemic testing at this time.  HFrEF secondary to ICM: Euvolemic and well compensated with NYHA class difficult to assess secondary to limited functional status in the setting of his comorbid conditions.  Relative hypotension continues to preclude escalation of GDMT.  He has been managed with Lopressor over carvedilol or Toprol-XL in the context of G-tube and frequent PVCs.  Not requiring a standing loop diuretic.  Remains on Jardiance 10 mg.  Patient will need a follow-up echo moving forward once his PVC burden is adequately managed following PCI and on maximally tolerated GDMT.  Mural thrombus: Remains on apixaban 5 mg twice daily.  Though this is off label use of DOAC, it has been felt that warfarin is not a good option for anticoagulation given his underlying comorbid conditions with active malignancy and baseline anemia/thrombocytopenia.  Monitoring of hemoglobin and platelet count is being done closely through hematology/oncology.  Atrial flutter: Remains in sinus rhythm with frequent PVCs in a pattern of ventricular bigeminy.  Remains on Lopressor 12.5 mg twice daily.  CHA2DS2-VASc at least 5.  Continue apixaban 5 mg twice daily and does not meet reduced dosing criteria.  No symptoms concerning for bleeding or falls.  Labs monitored  through oncology/hematology.  PVCs: Very frequent PVC burden of 40% on recent outpatient cardiac monitoring. Placed on amiodarone by EP. Follow-up surveillance labs showed abnormal LFT with normal TSH leading amiodarone to be discontinued in 05/2023 by EP.  Relative hypotension precludes escalation of Lopressor which will be continued at 12.5 mg twice daily.  Recommend he keep appointment with EP next month to discuss further management of frequent PVC burden, may need to consider mexiletine.  Tell patient is a PVC ablation candidate given his comorbidities.  It is unclear at this time if his PVC burden is contributing to his cardiomyopathy or in the context of his cardiomyopathy despite PCI.  Fortunately, he is asymptomatic.  Maintain potassium and magnesium at goal.  History of syncope: Reevaluated by EP on 06/01/2023 given persistent cardiomyopathy with frequent PVCs and runs of NSVT.  Not felt to be a candidate for ICD.  No further episodes.  HTN: Blood pressure is stable.  Medical therapy as outlined above.  HLD: LDL 92 in 12/2022 with goal being less than 55.  Remains on atorvastatin 80 mg.  Will need to consider fasting lipids at next visit.  Abnormal LFT: Possibly in the setting of amiodarone which has subsequently been discontinued.  EP recommends following up LFT at next visit.  History of DVT: Remains on apixaban as outlined above.  Oropharyngeal cancer status postgastrostomy tube following chemoradiation with concurrent anemia and intermittent thrombocytopenia: Management per oncology.    Disposition: F/u with Dr. Kirke Corin or an APP in 2 months, and EP as directed next month.   Medication Adjustments/Labs and Tests Ordered: Current medicines are reviewed at length with the patient today.  Concerns regarding medicines are outlined above. Medication changes, Labs and Tests ordered today are summarized above and listed in the Patient Instructions accessible in Encounters.   Signed, Eula Listen, PA-C 07/01/2023 1:07 PM     [facility] [place] [address] [place], [place] [address] [phone]

## 2023-07-01 ENCOUNTER — Encounter: Payer: Self-pay | Admitting: Physician Assistant

## 2023-07-01 ENCOUNTER — Ambulatory Visit: Payer: Medicare Other | Attending: Physician Assistant | Admitting: Physician Assistant

## 2023-07-01 VITALS — BP 106/60 | HR 66 | Ht 69.0 in | Wt 165.6 lb

## 2023-07-01 DIAGNOSIS — Z931 Gastrostomy status: Secondary | ICD-10-CM

## 2023-07-01 DIAGNOSIS — I513 Intracardiac thrombosis, not elsewhere classified: Secondary | ICD-10-CM | POA: Diagnosis not present

## 2023-07-01 DIAGNOSIS — E785 Hyperlipidemia, unspecified: Secondary | ICD-10-CM | POA: Diagnosis not present

## 2023-07-01 DIAGNOSIS — I4892 Unspecified atrial flutter: Secondary | ICD-10-CM

## 2023-07-01 DIAGNOSIS — I493 Ventricular premature depolarization: Secondary | ICD-10-CM | POA: Diagnosis not present

## 2023-07-01 DIAGNOSIS — C109 Malignant neoplasm of oropharynx, unspecified: Secondary | ICD-10-CM

## 2023-07-01 DIAGNOSIS — I255 Ischemic cardiomyopathy: Secondary | ICD-10-CM

## 2023-07-01 DIAGNOSIS — I2510 Atherosclerotic heart disease of native coronary artery without angina pectoris: Secondary | ICD-10-CM | POA: Diagnosis not present

## 2023-07-01 DIAGNOSIS — I5020 Unspecified systolic (congestive) heart failure: Secondary | ICD-10-CM | POA: Diagnosis not present

## 2023-07-01 DIAGNOSIS — Z86718 Personal history of other venous thrombosis and embolism: Secondary | ICD-10-CM | POA: Diagnosis not present

## 2023-07-01 DIAGNOSIS — Z87898 Personal history of other specified conditions: Secondary | ICD-10-CM

## 2023-07-01 DIAGNOSIS — I10 Essential (primary) hypertension: Secondary | ICD-10-CM | POA: Diagnosis not present

## 2023-07-01 DIAGNOSIS — R7989 Other specified abnormal findings of blood chemistry: Secondary | ICD-10-CM | POA: Diagnosis not present

## 2023-07-01 DIAGNOSIS — D649 Anemia, unspecified: Secondary | ICD-10-CM

## 2023-07-01 NOTE — Patient Instructions (Signed)
Medication Instructions: Your physician recommends that you continue on your current medications as directed. Please refer to the Current Medication list given to you today.   *If you need a refill on your cardiac medications before your next appointment, please call your pharmacy*   Lab Work: none ordered  If you have labs (blood work) drawn today and your tests are completely normal, you will receive your results only by: MyChart Message (if you have MyChart) OR A paper copy in the mail If you have any lab test that is abnormal or we need to change your treatment, we will call you to review the results.   Testing/Procedures: none ordered  Follow-Up: At [facility], you and your health needs are our priority.  As part of our continuing mission to provide you with exceptional heart care, we have created designated Provider Care Teams.  These Care Teams include your primary Cardiologist (physician) and Advanced Practice Providers (APPs -  Physician Assistants and Nurse Practitioners) who all work together to provide you with the care you need, when you need it.  We recommend signing up for the patient portal called "MyChart".  Sign up information is provided on this After Visit Summary.  MyChart is used to connect with patients for Virtual Visits (Telemedicine).  Patients are able to view lab/test results, encounter notes, upcoming appointments, etc.  Non-urgent messages can be sent to your provider as well.   To learn more about what you can do with MyChart, go to [email or web address].    Your next appointment:  July 29, 2023  2 pm. Please arrive at 1:45 pm   Provider:   Sherie Don, NP

## 2023-07-13 ENCOUNTER — Ambulatory Visit
Admission: RE | Admit: 2023-07-13 | Discharge: 2023-07-13 | Disposition: A | Discharge status: Home / Self Care | Payer: Medicare Other | Source: Ambulatory Visit | Attending: Oncology | Admitting: Oncology

## 2023-07-13 DIAGNOSIS — I2510 Atherosclerotic heart disease of native coronary artery without angina pectoris: Secondary | ICD-10-CM | POA: Diagnosis not present

## 2023-07-13 DIAGNOSIS — N400 Enlarged prostate without lower urinary tract symptoms: Secondary | ICD-10-CM | POA: Diagnosis not present

## 2023-07-13 DIAGNOSIS — I700 Atherosclerosis of aorta: Secondary | ICD-10-CM | POA: Diagnosis not present

## 2023-07-13 DIAGNOSIS — C109 Malignant neoplasm of oropharynx, unspecified: Secondary | ICD-10-CM | POA: Diagnosis not present

## 2023-07-13 LAB — GLUCOSE, CAPILLARY: Glucose-Capillary: 82 mg/dL (ref 70–99)

## 2023-07-13 MED ORDER — FLUDEOXYGLUCOSE F - 18 (FDG) INJECTION
8.6000 | Freq: Once | INTRAVENOUS | Status: AC | PRN
  Administered 2023-07-13: 9.15 via INTRAVENOUS

## 2023-07-25 ENCOUNTER — Ambulatory Visit
Admission: RE | Admit: 2023-07-25 | Discharge: 2023-07-25 | Disposition: A | Discharge status: Home / Self Care | Payer: Medicare Other | Source: Ambulatory Visit | Attending: Radiation Oncology | Admitting: Radiation Oncology

## 2023-07-25 VITALS — BP 95/69 | HR 56 | Temp 96.2°F | Resp 16 | Ht 69.0 in | Wt 167.9 lb

## 2023-07-25 DIAGNOSIS — C059 Malignant neoplasm of palate, unspecified: Secondary | ICD-10-CM | POA: Diagnosis not present

## 2023-07-25 DIAGNOSIS — F17220 Nicotine dependence, chewing tobacco, uncomplicated: Secondary | ICD-10-CM | POA: Diagnosis not present

## 2023-07-25 DIAGNOSIS — Z85818 Personal history of malignant neoplasm of other sites of lip, oral cavity, and pharynx: Secondary | ICD-10-CM | POA: Diagnosis not present

## 2023-07-25 DIAGNOSIS — R1314 Dysphagia, pharyngoesophageal phase: Secondary | ICD-10-CM | POA: Diagnosis not present

## 2023-07-25 DIAGNOSIS — C109 Malignant neoplasm of oropharynx, unspecified: Secondary | ICD-10-CM | POA: Diagnosis not present

## 2023-07-25 DIAGNOSIS — C069 Malignant neoplasm of mouth, unspecified: Secondary | ICD-10-CM | POA: Diagnosis not present

## 2023-07-25 DIAGNOSIS — C051 Malignant neoplasm of soft palate: Secondary | ICD-10-CM | POA: Diagnosis not present

## 2023-07-25 NOTE — Progress Notes (Signed)
Radiation Oncology Follow up Note  Name: Kenneth Gilmore   Date:   07/25/2023 MRN:  [id number] DOB: October 24, 1950    This 72 y.o. 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 He reports persistent difficulty swallowing, which has necessitated tube feeding for nutrition. Despite this, he reports managing well with the tube feeding, which he does four times a day. He has not been able to maintain contact with physical therapy due to a recent neck operation. He is scheduled to see an ear, nose, and throat specialist, who he anticipates will refer him to a specialist for his swallowing issue. The patient also has a history of frequent PVCs, systolic heart failure, ischemic cardiomyopathy, coronary artery disease, and LV mural thrombus..  He had a PET CT scan which I have reviewed showing on my interpretation no evidence of persistent or recurrent disease.  COMPLICATIONS OF TREATMENT: none  FOLLOW UP COMPLIANCE: keeps appointments   PHYSICAL EXAM:  BP 95/69   Pulse (!) 56   Temp (!) 96.2 F (35.7 C)   Resp 16   Ht 5\' 9"  (1.753 m)   Wt 167 lb 14.4 oz (76.2 kg)   BMI 24.79 kg/m  Oral cavity is clear no oral mucositis is noted.  Neck is clear without evidence of cervical or supraclavicular adenopathy.  Does have a feeding tube that is working well.  Well-developed well-nourished patient in NAD. HEENT reveals PERLA, EOMI, discs not visualized.  Oral cavity is clear. No oral mucosal lesions are identified. Neck is clear without evidence of cervical or supraclavicular adenopathy. Lungs are clear to A&P. Cardiac examination is essentially unremarkable with regular rate and rhythm without  murmur rub or thrill. Abdomen is benign with no organomegaly or masses noted. Motor sensory and DTR levels are equal and symmetric in the upper and lower extremities. Cranial nerves II through XII are grossly intact. Proprioception is intact. No peripheral adenopathy or edema is identified. No motor or sensory levels are noted. Crude visual fields are within normal range.  RADIOLOGY RESULTS: RADIOLOGY PET CT: No evidence of persistent or recurrent disease  PLAN: Stage IV Squamous Cell Carcinoma of the Oral Pharynx (CT3 N2A M0) Completed concurrent chemo radiation therapy 6 months ago. Recent PET CT scan shows no evidence of persistent or recurrent disease. -Continue follow-up with medical oncologist, Dr. Cathie Hoops. -Return for follow-up in 6 months.  Dysphagia Unable to swallow, currently on tube feeding. Recent neck operation interrupted swallowing therapy. -Continue tube feeding. -Consider resuming swallowing therapy after consultation with ENT specialist.  Cardiac Conditions Frequent PVCs, systolic heart failure, ischemic cardiomyopathy, coronary artery disease, LV mural thrombus. -Continue follow-up with cardiology.    Carmina Miller, MD

## 2023-07-26 ENCOUNTER — Other Ambulatory Visit: Payer: Self-pay

## 2023-07-27 ENCOUNTER — Inpatient Hospital Stay (HOSPITAL_BASED_OUTPATIENT_CLINIC_OR_DEPARTMENT_OTHER): Payer: Medicare Other | Admitting: Oncology

## 2023-07-27 ENCOUNTER — Inpatient Hospital Stay: Payer: Medicare Other

## 2023-07-27 ENCOUNTER — Encounter: Payer: Self-pay | Admitting: Oncology

## 2023-07-27 ENCOUNTER — Ambulatory Visit: Payer: Medicare Other | Admitting: Cardiology

## 2023-07-27 ENCOUNTER — Inpatient Hospital Stay: Payer: Medicare Other | Attending: Oncology

## 2023-07-27 VITALS — BP 111/57 | HR 91 | Temp 96.0°F | Resp 18 | Wt 167.8 lb

## 2023-07-27 DIAGNOSIS — Z79899 Other long term (current) drug therapy: Secondary | ICD-10-CM | POA: Diagnosis not present

## 2023-07-27 DIAGNOSIS — Z931 Gastrostomy status: Secondary | ICD-10-CM | POA: Diagnosis not present

## 2023-07-27 DIAGNOSIS — R1310 Dysphagia, unspecified: Secondary | ICD-10-CM | POA: Insufficient documentation

## 2023-07-27 DIAGNOSIS — I513 Intracardiac thrombosis, not elsewhere classified: Secondary | ICD-10-CM | POA: Diagnosis not present

## 2023-07-27 DIAGNOSIS — Z86718 Personal history of other venous thrombosis and embolism: Secondary | ICD-10-CM | POA: Diagnosis not present

## 2023-07-27 DIAGNOSIS — R1319 Other dysphagia: Secondary | ICD-10-CM | POA: Diagnosis not present

## 2023-07-27 DIAGNOSIS — Z7901 Long term (current) use of anticoagulants: Secondary | ICD-10-CM | POA: Insufficient documentation

## 2023-07-27 DIAGNOSIS — Z7902 Long term (current) use of antithrombotics/antiplatelets: Secondary | ICD-10-CM | POA: Diagnosis not present

## 2023-07-27 DIAGNOSIS — C109 Malignant neoplasm of oropharynx, unspecified: Secondary | ICD-10-CM

## 2023-07-27 DIAGNOSIS — E43 Unspecified severe protein-calorie malnutrition: Secondary | ICD-10-CM

## 2023-07-27 DIAGNOSIS — Z87891 Personal history of nicotine dependence: Secondary | ICD-10-CM | POA: Diagnosis not present

## 2023-07-27 LAB — CBC WITH DIFFERENTIAL (CANCER CENTER ONLY)
Abs Immature Granulocytes: 0.01 10*3/uL (ref 0.00–0.07)
Basophils Absolute: 0 10*3/uL (ref 0.0–0.1)
Basophils Relative: 1 %
Eosinophils Absolute: 0 10*3/uL (ref 0.0–0.5)
Eosinophils Relative: 1 %
HCT: 36.2 % — ABNORMAL LOW (ref 39.0–52.0)
Hemoglobin: 11.6 g/dL — ABNORMAL LOW (ref 13.0–17.0)
Immature Granulocytes: 0 %
Lymphocytes Relative: 24 %
Lymphs Abs: 1.2 10*3/uL (ref 0.7–4.0)
MCH: 31.5 pg (ref 26.0–34.0)
MCHC: 32 g/dL (ref 30.0–36.0)
MCV: 98.4 fL (ref 80.0–100.0)
Monocytes Absolute: 0.7 10*3/uL (ref 0.1–1.0)
Monocytes Relative: 15 %
Neutro Abs: 3 10*3/uL (ref 1.7–7.7)
Neutrophils Relative %: 59 %
Platelet Count: 207 10*3/uL (ref 150–400)
RBC: 3.68 MIL/uL — ABNORMAL LOW (ref 4.22–5.81)
RDW: 16 % — ABNORMAL HIGH (ref 11.5–15.5)
WBC Count: 5 10*3/uL (ref 4.0–10.5)
nRBC: 0 % (ref 0.0–0.2)

## 2023-07-27 LAB — CMP (CANCER CENTER ONLY)
ALT: 28 U/L (ref 0–44)
AST: 27 U/L (ref 15–41)
Albumin: 3.9 g/dL (ref 3.5–5.0)
Alkaline Phosphatase: 80 U/L (ref 38–126)
Anion gap: 11 (ref 5–15)
BUN: 26 mg/dL — ABNORMAL HIGH (ref 8–23)
CO2: 29 mmol/L (ref 22–32)
Calcium: 9.5 mg/dL (ref 8.9–10.3)
Chloride: 100 mmol/L (ref 98–111)
Creatinine: 0.8 mg/dL (ref 0.61–1.24)
GFR, Estimated: >60 mL/min (ref >60)
Glucose, Bld: 95 mg/dL (ref 70–99)
Potassium: 4.5 mmol/L (ref 3.5–5.1)
Sodium: 140 mmol/L (ref 135–145)
Total Bilirubin: 0.3 mg/dL (ref <1.2)
Total Protein: 7.4 g/dL (ref 6.5–8.1)

## 2023-07-27 LAB — TSH: TSH: 1.287 u[IU]/mL (ref 0.350–4.500)

## 2023-07-27 LAB — FOLATE: Folate: 11.4 ng/mL (ref >5.9)

## 2023-07-27 LAB — VITAMIN B12: Vitamin B-12: 661 pg/mL (ref 180–914)

## 2023-07-27 MED ORDER — APIXABAN 5 MG PO TABS: 5.0000 mg | ORAL_TABLET | Freq: Two times a day (BID) | ORAL | 3 refills | Status: AC

## 2023-07-27 MED ORDER — NYSTATIN 100000 UNIT/ML MT SUSP: 5.0000 mL | Freq: Four times a day (QID) | OROMUCOSAL | 0 refills | Status: AC

## 2023-07-27 NOTE — Assessment & Plan Note (Signed)
I referred him to reestablish care with speech evaluation. Refer to GI for evaluation. Prophylactic/empiric nystatin mouthwash swish and swallow.

## 2023-07-27 NOTE — Progress Notes (Signed)
Hematology/Oncology Progress note [phone] [phone]      REASON FOR VISIT Follow up for oropharyngeal squamous cell carcinoma  ASSESSMENT & PLAN:   Cancer Staging  Oropharyngeal cancer (HCC) Staging form: Oral Cavity, AJCC 8th Edition - Clinical: Stage IVA (cT3, cN2a, cM0) - Signed by [name], MD on 11/02/2022   Oropharyngeal cancer (HCC) stage IVA oropharyngeal squamous cell carcinoma, S/p concurrent cisplatin with Radiation.  Labs are reviewed and discussed with patient. Post concurrent chemoradiation PET in Aug 2024 showed good response, residual hypermetabolic activity residual soft tissue fullness towards the palatine tonsil SUV 5.7 Small right-sided level 2 nodes 6 mm with S.U.V. max of 3.1 New focus of hypermetabolism at the right side of the epiglottis without CT correlate - SUV of 6.5  C6-7 level hypermetabolism is likely due to recent fracture- [facility] and fusion of C2-T2  Continue follows up with ENT Dr. Willeen Cass for direct visualization, and possible biopsy if suspicious lesion, he reports having upcoming appt with him.  Repeat PET scan shows resolution of previous hypermetabolic area likely due to inflammation. New right mandibular angle activity, will review his images on tumor board.  Future plan to be determined.  LV (left ventricular) mural thrombus LV thrombus and Atrial Flutter,ejection fraction 35 to 40%, It is unclear whether he is taking Eliquis. Recommend patient to continue Eliquis 5mg  BID, refills sent.   S/P percutaneous endoscopic gastrostomy (PEG) tube placement [facility]) Patient can not care for his PEG tube Dressing was changed here at cancer center.  Home care only provides education but he can not do the change by himself.  He will return to radiation oncology  clinic once per week for dressing change.    Dysphagia I referred him to reestablish care with speech evaluation. Refer to GI for  evaluation. Prophylactic/empiric nystatin mouthwash swish and swallow.      Orders Placed This Encounter  Procedures   Ambulatory referral to Speech Therapy    Referral Priority:   Routine    Referral Type:   Speech Therapy    Referral Reason:   Specialty Services Required    Requested Specialty:   Speech Pathology    Number of Visits Requested:   10   Ambulatory referral to Gastroenterology    Referral Priority:   Routine    Referral Type:   Consultation    Referral Reason:   Specialty Services Required    Number of Visits Requested:   1    Follow-up 3 months lab MD. .  All questions were answered. The patient knows to call the clinic with any problems, questions or concerns.  [name], MD, PhD [facility] Hematology Oncology 07/27/2023   HISTORY OF PRESENTING ILLNESS:  Patient presented outpatient ultrasound of his left lower extremity showing positive for DVT. Denies any recent,, surgery, travel or issue.  Denies any family history of DVT.  Reports that he developed left lower extremity swelling for a week, associated with moderate severity throbbing aching discomfort feels like pulling a muscle.  Denies any shortness of breath. Patient was started on Xarelto starting kit and advised to follow-up with heme-onc.  INTERVAL HISTORY Kenneth Gilmore is a 72 y.o. male presents to re-establish care for oropharyngeal squamous cell carcinoma  Oncology History  Oropharyngeal cancer (HCC)  09/28/2022 Imaging   CT soft tissue neck with contrast showed infiltrating mass in the right tongue with multiple ipsilateral nodal metastases. The primary mass measures at least 4 cm  and infiltrates parapharyngeal fat, visualization of tumor extent is limited by artifact from dental amalgam.   10/28/2022 Initial Diagnosis   Squamous cell carcinoma of tongue (HCC) Patient has throat pain and was evaluated by ENT Dr.Bennett.  Physical examination showed cervical lymphadenopathy.  10/15/2022  ultrasound-guided biopsy of right cervical lymph node showed fibrous tissue, detach minute salivary gland tissue.  Granular debris's and a few dysplastic squamous epithelial cells, nondiagnostic.  10/22/2022 repeat ultrasound-guided biopsy of right cervical lymph node was positive for malignancy, metastatic squamous cell carcinoma, keratinizing, in a background of dense fibrosis and tumor necrosis.   10/28/2022 Cancer Staging   Staging form: Oral Cavity, AJCC 8th Edition - Clinical: Stage IVA (cT3, cN2a, cM0) - Signed by [name], MD on 11/02/2022   11/16/2022 Imaging   PET scan showed 1. Large infiltrating right oropharyngeal mass is markedly hypermetabolic and consistent with known squamous cell carcinoma. 2. Bilateral hypermetabolic metastatic cervical lymphadenopathy. 3. No evidence of metastatic disease involving the chest, abdomen/pelvis or bony structures.     12/09/2022 -  Chemotherapy   Patient is on Treatment Plan : HEAD/NECK Cisplatin (40) q7d     01/03/2023 - 01/11/2023 Hospital Admission   Kenneth Gilmore is a 72 year old male with non-insulin-dependent diabetes mellitus, hyperlipidemia, history of DVT on Xarelto, oropharyngeal squamous cell carcinoma stage IVa, who presents emergency department from oncology clinic for chief concerns of syncope event at home while he was in the shower. High sensitive troponin is 2925.  Telemetry showed atrial flutter, he was initially given diltiazem gtt., and quickly converted to sinus. Patient also had a significant dysphagia from oropharyngeal cancer, G tube is placed on 5/17, TF started.  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  2 drug-eluting stents were placed.  Patient is placed on Plavix.     01/07/2023 Procedure   S/p gastrostomy tube.  placement.  5/22 G  tube tip was broken and Luer-Lok tip was replaced in ER   04/12/2023 Imaging   PET scan 1. Response to therapy of right tongue base primary and cervical nodal metastasis. 2. New hypermetabolism within the right side of the epiglottis is nonspecific and without CT correlate. Consider direct visualization. 3. No distant metastasis identified. 4. New hypermetabolism about the C6-7 level. Possible fractures in the region of the pars, suboptimally and incompletely evaluated. If clinical concern of fracture (trauma by report on on 01/03/2023) repeat cervical spine CT recommended. If metastasis or infection are clinically suspected (felt less likely) pre and postcontrast cervical spine MRI could be performed. 5. Cholelithiasis with possible pericholecystic edema. Correlate with right upper quadrant symptoms and consider ultrasound. 6. Incidental findings, including: Coronary artery atherosclerosis.Aortic Atherosclerosis (ICD10-I70.0). Prostatomegaly. 7. Aortic valvular calcifications. Consider echocardiography to evaluate for valvular dysfunction.   07/25/2023 Imaging   PET scan restaging showed 1. New focal area of hypermetabolism in thickened soft tissues deep to the right mandibular angle. Correlation with CT is challenging without IV contrast. Consider CT neck with contrast in further evaluation, as clinically indicated. 2. Stable residual mild uptake along the medial wall of the right oropharynx. No evidence of distant metastatic disease. 3. Enlarged prostate. 4. Aortic atherosclerosis (ICD10-I70.0). Coronary artery calcification.       INTERVAL HISTORY Kenneth Gilmore is a 72 y.o. male who has above history reviewed by me today presents for follow up visit  for P16 + oropharyngeal squamous cell carcinoma S/p  concurrent chemotherapy with radiation.  Today he reports feeling better. He comes to Radonc department to get his tube feeding dressing changed.  + Dysphagia, he continues to have  difficulty swallowing.  On tube feeding. Denies nausea vomiting, diarrhea, abdominal pain.  + Cervical fracture after his fall on 03/03/23, he sees [place] and patient is status post decompression and fusion of C2-T2 Denies any new weakness, headache, confusion.   Review of Systems  Constitutional:  Positive for malaise/fatigue. Negative for chills, fever and weight loss.  HENT:  Negative for sore throat.   Eyes:  Negative for redness.  Respiratory:  Negative for cough, shortness of breath and wheezing.   Cardiovascular:  Negative for chest pain and palpitations.  Gastrointestinal:  Negative for abdominal pain, blood in stool, nausea and vomiting.       Dysphagia  Genitourinary:  Negative for dysuria.  Musculoskeletal:  Negative for myalgias.  Skin:  Negative for rash.  Neurological:  Negative for dizziness, tingling and tremors.  Endo/Heme/Allergies:  Does not bruise/bleed easily.  Psychiatric/Behavioral:  Negative for hallucinations.     MEDICAL HISTORY:  Past Medical History:  Diagnosis Date   CAD (coronary artery disease)    Diabetes mellitus without complication (HCC)    DVT (deep venous thrombosis) (HCC)    Frequent PVCs    HFrEF (heart failure with reduced ejection fraction) (HCC)    Hyperlipidemia    Hypertension    Mural thrombus of cardiac apex    NSVT (nonsustained ventricular tachycardia) (HCC)    Oropharyngeal cancer (HCC)     SURGICAL HISTORY: Past Surgical History:  Procedure Laterality Date   arm surgery Left    CORONARY ANGIOPLASTY WITH STENT PLACEMENT     CORONARY STENT INTERVENTION N/A 01/10/2023   Procedure: CORONARY STENT INTERVENTION;  Surgeon: Iran Ouch, MD;  Location: [facility] INVASIVE CV LAB;  Service: Cardiovascular;  Laterality: N/A;   IR GASTROSTOMY TUBE MOD SED  01/07/2023   left side rotator cuff surgery     NECK SURGERY  05/18/2023   RIGHT/LEFT HEART CATH AND CORONARY ANGIOGRAPHY N/A 01/10/2023   Procedure: RIGHT/LEFT HEART CATH AND  CORONARY ANGIOGRAPHY;  Surgeon: Iran Ouch, MD;  Location: [facility] INVASIVE CV LAB;  Service: Cardiovascular;  Laterality: N/A;   SHOULDER SURGERY      SOCIAL HISTORY: Social History   Socioeconomic History   Marital status: Single    Spouse name: Not on file   Number of children: Not on file   Years of education: Not on file   Highest education level: Not on file  Occupational History   Occupation: retired  Tobacco Use   Smoking status: Former    Types: Cigarettes   Smokeless tobacco: Current    Types: Designer, multimedia Use   Vaping status: Never Used  Substance and Sexual Activity   Alcohol use: No   Drug use: Never   Sexual activity: Not Currently  Other Topics Concern   Not on file  Social History Narrative   Not on file   Social Determinants of Health   Financial Resource Strain: Low Risk  (12/27/2022)   Overall Financial Resource Strain (CARDIA)    Difficulty of Paying Living Expenses: Not hard at all  Food Insecurity: No Food Insecurity (01/06/2023)   Hunger Vital Sign    Worried About Running Out of Food in the Last Year: Never true    Ran Out of Food in the Last Year: Never  true  Transportation Needs: No Transportation Needs (01/06/2023)   PRAPARE - Administrator, Civil Service (Medical): No    Lack of Transportation (Non-Medical): No  Physical Activity: Not on file  Stress: Stress Concern Present (12/27/2022)   [employer] of Occupational Health - Occupational Stress Questionnaire    Feeling of Stress : To some extent  Social Connections: Moderately Integrated (12/27/2022)   Social Connection and Isolation Panel [NHANES]    Frequency of Communication with Friends and Family: More than three times a week    Frequency of Social Gatherings with Friends and Family: More than three times a week    Attends Religious Services: More than 4 times per year    Active Member of [employer] or Organizations: Yes    Attends Banker Meetings: More than  4 times per year    Marital Status: Never married  Intimate Partner Violence: Not At Risk (01/06/2023)   Humiliation, Afraid, Rape, and Kick questionnaire    Fear of Current or Ex-Partner: No    Emotionally Abused: No    Physically Abused: No    Sexually Abused: No    FAMILY HISTORY: Family History  Problem Relation Age of Onset   Cancer Father        lung    ALLERGIES:  has No Known Allergies.  MEDICATIONS:  Current Outpatient Medications  Medication Sig Dispense Refill   atorvastatin (LIPITOR) 80 MG tablet Place 1 tablet (80 mg total) into feeding tube daily. 30 tablet 0   clopidogrel (PLAVIX) 75 MG tablet Place 1 tablet (75 mg total) into feeding tube daily with breakfast. 30 tablet 0   empagliflozin (JARDIANCE) 10 MG TABS tablet Take 1 tablet (10 mg total) by mouth daily. 30 tablet 0   metFORMIN (GLUCOPHAGE) 500 MG tablet Take 500 mg by mouth 2 (two) times daily with a meal.     Nutritional Supplements (FEEDING SUPPLEMENT, OSMOLITE 1.5 CAL,) LIQD Give 2 cartons at 8am, noon, 4pm feeding via feeding tube.  Give 1 carton at 8pm via feeding tube.  Flush with 60ml water before and after each feeding.  Flush with additional TID between feedings to better meet hydration needs. Send bolus supplies. 1659 mL 0   nystatin (MYCOSTATIN) 100000 UNIT/ML suspension Take 5 mLs (500,000 Units total) by mouth 4 (four) times daily. Swish and swallow 60 mL 0   Protein (FEEDING SUPPLEMENT, PROSOURCE TF20,) liquid Place 60 mLs into feeding tube 2 (two) times daily.     Water For Irrigation, Sterile (FREE WATER) SOLN Place 200 mLs into feeding tube 4 (four) times daily - after meals and at bedtime.     apixaban (ELIQUIS) 5 MG TABS tablet Place 1 tablet (5 mg total) into feeding tube 2 (two) times daily. 60 tablet 3   fluconazole (DIFLUCAN) 100 MG tablet Take 1 tablet (100 mg total) by mouth daily. (Patient not taking: Reported on 07/01/2023) 10 tablet 0   metoprolol tartrate (LOPRESSOR) 25 MG tablet  Place 0.5 tablets (12.5 mg total) into feeding tube 2 (two) times daily. (Patient not taking: Reported on 07/01/2023) 60 tablet 0   oxyCODONE (OXY IR/ROXICODONE) 5 MG immediate release tablet Place 1 tablet (5 mg total) into feeding tube every 6 (six) hours as needed for severe pain. (Patient not taking: Reported on 07/01/2023) 30 tablet 0   No current facility-administered medications for this visit.     PHYSICAL EXAMINATION: ECOG PERFORMANCE STATUS: 1 - Symptomatic but completely ambulatory Vitals:   07/27/23  1047  BP: (!) 111/57  Pulse: 91  Resp: 18  Temp: (!) 96 F (35.6 C)  SpO2: 100%   Filed Weights   07/27/23 1047  Weight: 167 lb 12.8 oz (76.1 kg)    Physical Exam Constitutional:      General: He is not in acute distress. HENT:     Head: Normocephalic and atraumatic.     Mouth/Throat:     Comments: Restricted mouth opening/trismus Thrush Eyes:     General: No scleral icterus. Neck:     Comments: Collar immobilizer Cardiovascular:     Rate and Rhythm: Normal rate.  Pulmonary:     Effort: Respiratory distress present.  Abdominal:     General: There is no distension.     Palpations: Abdomen is soft.     Comments: PEG tube site tenderness with erythematous changes  Musculoskeletal:        General: No swelling. Normal range of motion.  Skin:    Findings: No erythema or rash.  Neurological:     Mental Status: He is alert and oriented to person, place, and time. Mental status is at baseline.     Cranial Nerves: No cranial nerve deficit.  Psychiatric:        Mood and Affect: Mood normal.      LABORATORY DATA:  I have reviewed the data as listed     Latest Ref Rng & Units 07/27/2023   10:35 AM 04/06/2023    8:55 AM 03/21/2023    9:02 AM  CBC  WBC 4.0 - 10.5 K/uL 5.0  6.9  6.8   Hemoglobin 13.0 - 17.0 g/dL 78.4  69.6  29.5   Hematocrit 39.0 - 52.0 % 36.2  36.6  34.1   Platelets 150 - 400 K/uL 207  190  248       Latest Ref Rng & Units 07/27/2023   10:36  AM 05/27/2023    9:23 AM 04/06/2023    8:55 AM  CMP  Glucose 70 - 99 mg/dL 95  284  132   BUN 8 - 23 mg/dL 26  24  26    Creatinine 0.61 - 1.24 mg/dL 4.40  1.02  7.25   Sodium 135 - 145 mmol/L 140  139  139   Potassium 3.5 - 5.1 mmol/L 4.5  4.6  4.9   Chloride 98 - 111 mmol/L 100  99  99   CO2 22 - 32 mmol/L 29  25  29    Calcium 8.9 - 10.3 mg/dL 9.5  9.0  9.7   Total Protein 6.5 - 8.1 g/dL 7.4  6.0  7.4   Total Bilirubin <1.2 mg/dL 0.3  0.4  0.6   Alkaline Phos 38 - 126 U/L 80  250  80   AST 15 - 41 U/L 27  43  30   ALT 0 - 44 U/L 28  172  24

## 2023-07-27 NOTE — Assessment & Plan Note (Signed)
LV thrombus and Atrial Flutter,ejection fraction 35 to 40%, It is unclear whether he is taking Eliquis. Recommend patient to continue Eliquis 5mg  BID, refills sent.

## 2023-07-27 NOTE — Assessment & Plan Note (Addendum)
stage IVA oropharyngeal squamous cell carcinoma, S/p concurrent cisplatin with Radiation.  Labs are reviewed and discussed with patient. Post concurrent chemoradiation PET in Aug 2024 showed good response, residual hypermetabolic activity residual soft tissue fullness towards the palatine tonsil SUV 5.7 Small right-sided level 2 nodes 6 mm with S.U.V. max of 3.1 New focus of hypermetabolism at the right side of the epiglottis without CT correlate - SUV of 6.5  C6-7 level hypermetabolism is likely due to recent fracture- [facility] and fusion of C2-T2  Continue follows up with ENT Dr. Willeen Cass for direct visualization, and possible biopsy if suspicious lesion, he reports having upcoming appt with him.  Repeat PET scan shows resolution of previous hypermetabolic area likely due to inflammation. New right mandibular angle activity, will review his images on tumor board.  Future plan to be determined.

## 2023-07-27 NOTE — Progress Notes (Signed)
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    Spoke with patient following MD visit.  Reports that he continues to give 12 oz of osmolite 1.5 4 times a day (6 cartons/day) via feeding tube.  Usually gives 1 3/4 cup water at each feeding (flush and medication flush).  Reports tolerating tube feeding without difficulty.  Saying that he wants to eat orally.  Has been able to stop wearing neck brace for the last week.  SLP was placed on hold during neck surgery.    Reports that he is getting enteral supplies from the [place] now and has a virtual appointment with the RD on 12/9  Medications: reviewed  Labs: Na 140, BUN 26, creatinine 0.80  Anthropometrics:   Weight 167 lb 12.8 oz today  178 lb 2 oz on 10/9 176 lb on 8/21 176 lb on 6/27 168 lb on 5/31 190 lb on 4/25  Says he has not missed any doses of tube feeding   Estimated Energy Needs  Kcals: 2150-2580 Protein: 108-121 g Fluid: > 2.1 L  NUTRITION DIAGNOSIS: Severe malnutrition improved    INTERVENTION:  Continue osmolite 1.5, 6 cartons daily.  Discussed if weight continues to decline would recommend adding another carton of tube feeding.   Provides 2130 calories, 89 g protein and 2760 ml free water (including from formula) Ask patient to have RD from [place] call to coordinate care.   RD will reach out to SLP to give patient a call and discuss if he is appropriate to resume therapy.  Patient agreeable Contact information given    MONITORING, EVALUATION, GOAL: weight trends, intake   NEXT VISIT: Wed, Jan 8 phone call  Emilija Bohman B. Freida Busman, RD, LDN Registered Dietitian [phone]

## 2023-07-27 NOTE — Assessment & Plan Note (Addendum)
Patient can not care for his PEG tube Dressing was changed here at cancer center.  Home care only provides education but he can not do the change by himself.  He will return to radiation oncology  clinic once per week for dressing change.

## 2023-07-28 ENCOUNTER — Ambulatory Visit: Payer: Medicare Other | Admitting: Cardiology

## 2023-07-28 NOTE — Progress Notes (Signed)
Cardiology Clinic Note    Date:  07/29/2023  Patient ID:  Kenneth Gilmore, Kenneth Gilmore [birth year], MRN [id number] PCP:  [facility]  Cardiologist:  [name], MD Electrophysiologist: Lanier Prude, MD   Patient Profile    Chief Complaint: PVC follow-up  History of Present Illness: Kenneth Gilmore is a 72 y.o. male with PMH notable for PVCs, NSVT, atrial flutter, HFrEF, ICM, CAD s/p PCI (2003, 2024), LV mural thrombus, stage 4 oropharyngeal cancer; seen today for Lanier Prude, MD for routine electrophysiology followup.  He first saw Dr. Lalla Brothers 04/2023 for EP consult for ICD with persistently reduced LVEF on maximally tolerated GDMT. Given his complex, multiple co-morbidities, did not recommend an ICD. They also discussed his PVCs and NSVT, and started amiodarone. He had routine labwork in follow-up that showed elevated ALT and alk phos, and amiodarone was stopped though unsure whether amiodarone was contributing to the abnormal LFTs.  Updated labwork 12/4 with resolution of abnormal LFTs, normal TSH  On follow-up today, he feels about the same. He denies chest pain, chest pressure, palpitations. His main complaint / request is that he wants to be able to swallow. He limits his activities for fear of catching or snagging his feeding tube.  He diligently takes eliquis BID, no bleeding concerns He denies dizziness or presyncope.     AAD History: Amiodarone, stopped d/t abnormal LFTs    ROS:  Please see the history of present illness. All other systems are reviewed and otherwise negative.    Physical Exam    VS:  BP 110/60 (BP Location: Left Arm, Patient Position: Sitting, Cuff Size: Normal)   Pulse 63   Ht 5\' 9"  (1.753 m)   Wt 169 lb 2 oz (76.7 kg)   BMI 24.98 kg/m  BMI: Body mass index is 24.98 kg/m.  Wt Readings from Last 3 Encounters:  07/29/23 169 lb 2 oz (76.7 kg)  07/27/23 167 lb 12.8 oz (76.1 kg)  07/25/23 167 lb 14.4 oz (76.2 kg)     GEN- The  patient is well appearing, alert and oriented x 3 today.   Lungs- Clear to ausculation bilaterally, normal work of breathing.  Heart- Irregularly irregular rate and rhythm, no murmurs, rubs or gallops Extremities- 1-2+ peripheral edema, warm, dry    Studies Reviewed   Previous EP, cardiology notes.    EKG is not ordered. Personal review of EKG from  07/01/2023  shows:  SR with bigeminal, monomorphic PVCs Rate 66bpm   EKG Interpretation Date/Time:  Friday July 29 2023 13:37:17 EST Ventricular Rate:  63 PR Interval:  146 QRS Duration:  78 QT Interval:  442 QTC Calculation: 452 R Axis:   11  Text Interpretation: Sinus rhythm with frequent Premature ventricular complexes in a pattern of bigeminy Low voltage QRS Confirmed by Sherie Don [phone]) on 07/29/2023 1:41:35 PM    Limited TTE, 04/18/2023  1. Left ventricular ejection fraction, by estimation, is 30 to 35%. The left ventricle has moderate to severely decreased function. The left ventricle demonstrates regional wall motion abnormalities (see scoring diagram/findings for description). There is severe hypokinesis of the left ventricular, mid-apical anteroseptal wall, anterior wall and apical segment.   2. Right ventricular systolic function is normal.   3. The mitral valve is normal in structure. Mild mitral valve regurgitation.   Long term monitor, 03/16/2023 Patient had a min HR of 68 bpm, max HR of 174 bpm, and avg HR of 77 bpm. Predominant underlying rhythm  was Sinus Rhythm. 82 Ventricular Tachycardia runs occurred, the run with the fastest interval lasting 5 beats with a max rate of 174 bpm, the longest lasting 6 beats with an avg rate of 107 bpm.  Very frequent PVCs with a burden of 40%.  PVC count might be overestimated due to motion artifact.  R/LHC, 01/10/2023   Ost RCA to Prox RCA lesion is 40% stenosed.   Mid RCA lesion is 40% stenosed.   RPAV lesion is 30% stenosed.   Mid LM to Dist LM lesion is 20% stenosed.   Mid  LAD-2 lesion is 100% stenosed.   Mid LAD-1 lesion is 100% stenosed.   Lat 1st Diag lesion is 90% stenosed.   Prox Cx to Mid Cx lesion is 99% stenosed.   Ost Cx to Prox Cx lesion is 70% stenosed.   A drug-eluting stent was successfully placed using a STENT ONYX FRONTIER 2.25X38.   A drug-eluting stent was successfully placed using a STENT ONYX FRONTIER 2.5X12.   Post intervention, there is a 0% residual stenosis.   Post intervention, there is a 0% residual stenosis.   1.  Heavily calcified coronary arteries with severe two-vessel coronary artery disease.  Chronically occluded mid LAD stent with faint left to left collaterals.  Severe in-stent restenosis of the mid left circumflex as well as severe calcified stenosis proximally.  The RCA has moderate nonobstructive disease. 2.  Left ventricular angiography was not performed.  EF was moderately reduced by echo. 3.  Right heart catheterization showed low filling pressures, normal pulmonary pressure and normal cardiac output. 4.  Successful complex angioplasty and 2 overlapped drug-eluting stent placement to the left circumflex extending to the ostium.  Difficult procedure due to diffuse calcified disease and difficulty delivering stents.  An extension guide catheter had to be used with predilation to high-pressure with a score flex balloon.   Recommendations: Treat the rest of coronary artery disease medically. The patient was loaded with clopidogrel which should be continued for at least 1 year.  Aspirin can be discontinued once the patient is started on oral anticoagulation for LV thrombus and atrial flutter.  For now, will resume heparin drip in 4 hours.   Assessment and Plan    #) PVCs #) NSVT 40% PVC burden on recent Zio monitor Briefly on amiodarone, was stopped d/t abnormal LFTs that have since normalized Start mexiletine 150mg  TID Start magnesium 400mg  daily  #) HFrEF #) ICM #) CAD s/p PCI LVEF 30-35% on maximally tolerated  GDMT Not ICD candidate per Dr. Lalla Brothers Denies ischemic s/s  #) aflutter Appreciated on tele while hospitalized for NSTEMI, no reoccurrence on recent zio Continue eliquis for CHA2DS2-VASc Score = 5 [CHF History: 1, HTN History: 1, Diabetes History: 1, Stroke History: 0, Vascular Disease History: 1, Age Score: 1, Gender Score: 0].  Therefore, the patient's annual risk of stroke is 7.2 %.      #) LV mural thrombus Resolution on eliquis, continue eliquis as above      Current medicines are reviewed at length with the patient today.   The patient does not have concerns regarding his medicines.  The following changes were made today:   START mexiletine 150mg  TID START mag-ox 400mg  daily  Labs/ tests ordered today include:  Orders Placed This Encounter  Procedures   EKG 12-Lead     Disposition: Follow up with Dr. Lalla Brothers or EP APP  in 2 months    Signed, Sherie Don, NP  07/29/23  2:33 PM  Electrophysiology [facility]

## 2023-07-29 ENCOUNTER — Encounter: Payer: Self-pay | Admitting: Cardiology

## 2023-07-29 ENCOUNTER — Ambulatory Visit: Payer: Medicare Other | Attending: Cardiology | Admitting: Cardiology

## 2023-07-29 VITALS — BP 110/60 | HR 63 | Ht 69.0 in | Wt 169.1 lb

## 2023-07-29 DIAGNOSIS — I493 Ventricular premature depolarization: Secondary | ICD-10-CM

## 2023-07-29 MED ORDER — MAGNESIUM OXIDE 400 MG PO CAPS: 400.0000 mg | ORAL_CAPSULE | Freq: Every day | ORAL | 3 refills | Status: AC

## 2023-07-29 MED ORDER — MEXILETINE HCL 150 MG PO CAPS: 150.0000 mg | ORAL_CAPSULE | Freq: Three times a day (TID) | ORAL | 3 refills | Status: AC

## 2023-07-29 NOTE — Patient Instructions (Signed)
Medication Instructions:  Your physician has recommended you make the following change in your medication:   START Mexiletine 150 mg three times a day START Magnesium oxide 400 mg once daily   *If you need a refill on your cardiac medications before your next appointment, please call your pharmacy*   Lab Work: None  If you have labs (blood work) drawn today and your tests are completely normal, you will receive your results only by: MyChart Message (if you have MyChart) OR A paper copy in the mail If you have any lab test that is abnormal or we need to change your treatment, we will call you to review the results.   Testing/Procedures: None   Follow-Up: At [facility], you and your health needs are our priority.  As part of our continuing mission to provide you with exceptional heart care, we have created designated Provider Care Teams.  These Care Teams include your primary Cardiologist (physician) and Advanced Practice Providers (APPs -  Physician Assistants and Nurse Practitioners) who all work together to provide you with the care you need, when you need it.    Your next appointment:   2 month(s)  Provider:   Sherie Don, NP

## 2023-08-02 ENCOUNTER — Ambulatory Visit: Payer: Medicare Other | Admitting: Cardiology

## 2023-08-03 ENCOUNTER — Ambulatory Visit: Payer: Medicare Other | Attending: Oncology | Admitting: Speech Pathology

## 2023-08-03 DIAGNOSIS — C109 Malignant neoplasm of oropharynx, unspecified: Secondary | ICD-10-CM | POA: Insufficient documentation

## 2023-08-03 DIAGNOSIS — R252 Cramp and spasm: Secondary | ICD-10-CM | POA: Insufficient documentation

## 2023-08-03 DIAGNOSIS — R1312 Dysphagia, oropharyngeal phase: Secondary | ICD-10-CM | POA: Diagnosis not present

## 2023-08-03 DIAGNOSIS — R1319 Other dysphagia: Secondary | ICD-10-CM | POA: Diagnosis not present

## 2023-08-03 NOTE — Therapy (Signed)
OUTPATIENT SPEECH LANGUAGE PATHOLOGY  TREATMENT NOTE   Patient Name: Kenneth Gilmore MRN: [id number] DOB:April 25, 1951, 72 y.o., male Today's Date: 08/03/2023  PCP: [facility] [place] REFERRING PROVIDER: Laurette Schimke, NP  END OF SESSION:  End of Session - 08/03/23 1102     Visit Number 9    Number of Visits 21    Date for SLP Re-Evaluation 10/26/23    Authorization Type [employer]    Progress Note Due on Visit 10    SLP Start Time 1030    SLP Stop Time  1100    SLP Time Calculation (min) 30 min    Activity Tolerance Patient tolerated treatment well             Past Medical History:  Diagnosis Date   CAD (coronary artery disease)    Diabetes mellitus without complication (HCC)    DVT (deep venous thrombosis) (HCC)    Frequent PVCs    HFrEF (heart failure with reduced ejection fraction) (HCC)    Hyperlipidemia    Hypertension    Mural thrombus of cardiac apex    NSVT (nonsustained ventricular tachycardia) (HCC)    Oropharyngeal cancer (HCC)    Past Surgical History:  Procedure Laterality Date   arm surgery Left    CORONARY ANGIOPLASTY WITH STENT PLACEMENT     CORONARY STENT INTERVENTION N/A 01/10/2023   Procedure: CORONARY STENT INTERVENTION;  Surgeon: Iran Ouch, MD;  Location: [facility] INVASIVE CV LAB;  Service: Cardiovascular;  Laterality: N/A;   IR GASTROSTOMY TUBE MOD SED  01/07/2023   left side rotator cuff surgery     NECK SURGERY  05/18/2023   RIGHT/LEFT HEART CATH AND CORONARY ANGIOGRAPHY N/A 01/10/2023   Procedure: RIGHT/LEFT HEART CATH AND CORONARY ANGIOGRAPHY;  Surgeon: Iran Ouch, MD;  Location: [facility] INVASIVE CV LAB;  Service: Cardiovascular;  Laterality: N/A;   SHOULDER SURGERY     Patient Active Problem List   Diagnosis Date Noted   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) 01/25/2023   Essential tremor 01/25/2023   Pure hypercholesterolemia, unspecified 01/25/2023   Benign hypertension 01/25/2023   Type 2  diabetes mellitus (HCC) 01/25/2023   Long term (current) use of anticoagulants 01/25/2023   Exposure to potentially hazardous substance 01/25/2023   Atherosclerosis of coronary artery without angina pectoris 01/25/2023   Chronic ischemic heart disease 01/25/2023   S/P percutaneous endoscopic gastrostomy (PEG) tube placement (HCC) 01/13/2023   CAD, multiple vessel 01/13/2023   Pancytopenia (HCC) 01/07/2023   Poor nutrition 01/06/2023   Dilated cardiomyopathy (HCC) 01/06/2023   Hypomagnesemia 01/05/2023   Hypokalemia 01/05/2023   LV (left ventricular) mural thrombus 01/05/2023   Chronic systolic heart failure (HCC) 01/05/2023   Thrombocythemia 01/05/2023   Palliative care encounter 01/04/2023   NSTEMI (non-ST elevated myocardial infarction) (HCC) 01/03/2023   Diabetes mellitus type 2, noninsulin dependent (HCC) 01/03/2023   Protein-calorie malnutrition, severe (HCC) 01/03/2023   Benign essential tremor 01/03/2023   Atrial flutter (HCC) 01/03/2023   Hypotension 01/03/2023   Syncope and collapse 01/03/2023   Trismus 12/23/2022   Dysphagia 12/16/2022   Thrush 12/16/2022   Encounter for antineoplastic chemotherapy 12/09/2022   Neoplasm related pain 12/09/2022   Goals of care, counseling/discussion 11/02/2022   History of DVT (deep vein thrombosis) 11/02/2022   Oropharyngeal cancer (HCC) 10/28/2022   Hyperlipidemia 04/01/2014   Systemic primary arterial hypertension 04/01/2014    ONSET DATE: 10/28/2022 date of diagnosis;  12/14/2022 date of referral  REFERRING DIAG: C10.9 (ICD-10-CM) - Oropharyngeal cancer (  HCC)   THERAPY DIAG:  Dysphagia, oropharyngeal phase  Trismus  Rationale for Evaluation and Treatment: Rehabilitation  SUBJECTIVE:   PERTINENT HISTORY:  Pt is a 72 year old male with past medical history of DVT who is currently undergoing concurrent chemoradiation treatment for at 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   DIAGNOSTIC FINDINGS:  CT Soft  tissue neck - 09/30/2022 Infiltrating mass in the right tongue measuring  up to 4 cm. Prominent submucosal involvement including in the right  parapharyngeal fat. Assessment of relationship to the pterygoids and  extrinsic tongue musculature is somewhat hindered by streak artifact  from dental amalgam.   US biopsy - 10/22/2022 Lymph node, right cervical: Positive for malignancy; Metastatic Squamous Cell Carcinoma, Keratinizing, in a background of dense fibrosis and tumor necrosis An immunohistochemical study directed against p16 is strongly and  diffusely positive, indicating an HPV driven malignancy.   PAIN:  Are you having pain? Yes: NPRS scale: 8/10 Pain location: right tongue and jaw Pain description: pt with difficulty describing Aggravating factors: none Relieving factors: heat  FALLS: Has patient fallen in last 6 months?  Yes, while at beach  LIVING ENVIRONMENT: Lives with: lives alone Lives in: House/apartment  PLOF:  Level of assistance: Independent with ADLs, Independent with IADLs Employment: Retired  PATIENT GOALS: "I want to eat solid foods again"  SUBJECTIVE STATEMENT: "I got bad news the other day" Pt accompanied by: self  OBJECTIVE:   TODAY'S TREATMENT Skilled treatment session focused on pt's dysphagia, speech intelligibility and trismus goals. SLP facilitated session by providing the following interventions:  Pt discussed recent biopsy that revealed additional cancer.   PET scan 07/13/2023 revealed 1. New focal area of hypermetabolism in thickened soft tissues deep to the right mandibular angle. Correlation with CT is challenging without IV contrast. Consider CT neck with contrast in further evaluation, as clinically indicated. 2. Stable residual mild uptake along the medial wall of the right oropharynx. No evidence of distant metastatic disease.  Pt to met with surgeon at [facility].   Skilled observation of pt consuming thin water via cup was  provided. Pt with decreased s/s of aspiration (throat clearing with swallow). Pt with documented silent aspiration of thin liquids. Hopeful this is indication of increased pharyngeal ability. Education provided on oral care using Biotene.   Pt didn't participate in neck stretches d/t recent surgery on his neck.   Rare Min A provided for Masako and passive/active jaw ROM.    PATIENT EDUCATION: Education details: see above Person educated: Patient Education method: Explanation Education comprehension: verbalized understanding and needs further education  Research states the risk for dysphagia increases due to radiation and/or chemotherapy treatment due to a variety of factors, so SLP educated the pt about the possibility of reduced/limited ability for PO intake during rad tx. SLP also educated pt regarding possible changes to swallowing musculature after rad tx, and why adherence to dysphagia HEP provided today and PO consumption was necessary to inhibit muscle fibrosis following rad tx and to mitigate muscle disuse atrophy. SLP informed pt why this would be detrimental to their swallowing status and to their pulmonary health. Pt demonstrated understanding of these things to SLP. SLP encouraged pt to safely eat and drink as deep into their radiation/chemotherapy as possible to provide the best possible long-term swallowing outcome for pt.    HOMEWORK:   Passive/Active ROM for jaw   Pharyngeal strengthening exercises   Neck stretches   GOALS: Goals reviewed with patient? Yes  SHORT TERM  GOALS: Target date: 10 sessions  Patient will participate in objective swallowing evaluation (MBSS) to identify safest diet recommendation as well as therapeutic targets. Baseline: Goal status: INITIAL - MET  2.  The patient will achieve 90% intelligibility with use of compensatory speech intelligibility strategies while reading aloud "The [place] Passage" and "The Rainbow Passage" to an unfamiliar  listener.  Baseline:  Goal status: INITIAL - MET  3.  Pt will demonstrate a return to full cervical ROM and function post operatively compared to baselines and not demonstrate any signs or symptoms of lymphedema.  Baseline:  Goal status: INITIAL - progressing  LONG TERM GOALS: Target date: 10/26/2023  Pt will be able to verbalize understanding of a home exercise program for pharyngeal, trismus and cervical range of motion.  Baseline:  Goal status: INITIAL - progressing  2.  Patient will improve perception of swallowing as indicated by an improvement in EAT-10 score to 5 (Baseline = 10) by 12 weeks from initial swallowing therapy session. Baseline:  Goal status: INITIAL - not met  3.  The patient will increase his maximal jaw opening by 10 mm to achieve enhanced speech intelligibility as evidenced by an improved score on a standardized dysarthria assessment.  Baseline:  Goal status: INITIAL - progressing  ASSESSMENT:  CLINICAL IMPRESSION: Pt is a 72 year old male who was seen today for re-evaluation of pt's trismus and dysphagia. He currently presents with improved trismus with MIO of 35 mm improved from 25mm. As a result his speech intelligibility is improved to > 95% at the simple conversation level with unknown information.   At this time, will defer possible instrumental swallow study until after he has consult with surgeon. Pt to engage in water protocol following oral care.   Data indicate that pt's swallow ability will likely decrease over the course of radiation/chemoradiation therapy and could very well decline over time following the conclusion of that therapy due to muscle disuse atrophy and/or muscle fibrosis. Pt will continue to need to be seen by SLP in order to assess safety of PO intake, assess the need for recommending any objective swallow assessment, and ensuring pt is correctly completing the individualized HEP.   OBJECTIVE IMPAIRMENTS: include dysarthria, dysphagia,  and trismus. These impairments are limiting patient from effectively communicating at home and in community and safety when swallowing. Factors affecting potential to achieve goals and functional outcome are medical prognosis and poor health literacy. Patient will benefit from skilled SLP services to address above impairments and improve overall function.  REHAB POTENTIAL: Good  PLAN:  SLP FREQUENCY: 1-2x/week  SLP DURATION: 12 weeks  PLANNED INTERVENTIONS: Aspiration precaution training, Pharyngeal strengthening exercises, Diet toleration management , Trials of upgraded texture/liquids, SLP instruction and feedback, Compensatory strategies, and Patient/family education   Rangel Echeverri B. Dreama Saa, M.S., CCC-SLP, Tree surgeon Certified Brain Injury Specialist [facility]  [facility] Office [phone] Ascom [phone] Fax [phone]

## 2023-08-04 ENCOUNTER — Other Ambulatory Visit: Payer: Medicare Other

## 2023-08-09 ENCOUNTER — Telehealth: Payer: Self-pay | Admitting: Internal Medicine

## 2023-08-09 DIAGNOSIS — Z981 Arthrodesis status: Secondary | ICD-10-CM | POA: Diagnosis not present

## 2023-08-09 DIAGNOSIS — Z931 Gastrostomy status: Secondary | ICD-10-CM | POA: Diagnosis not present

## 2023-08-09 DIAGNOSIS — Z7902 Long term (current) use of antithrombotics/antiplatelets: Secondary | ICD-10-CM | POA: Diagnosis not present

## 2023-08-09 DIAGNOSIS — B370 Candidal stomatitis: Secondary | ICD-10-CM | POA: Diagnosis not present

## 2023-08-09 DIAGNOSIS — Z923 Personal history of irradiation: Secondary | ICD-10-CM | POA: Diagnosis not present

## 2023-08-09 DIAGNOSIS — I2510 Atherosclerotic heart disease of native coronary artery without angina pectoris: Secondary | ICD-10-CM | POA: Diagnosis not present

## 2023-08-09 DIAGNOSIS — C059 Malignant neoplasm of palate, unspecified: Secondary | ICD-10-CM | POA: Diagnosis not present

## 2023-08-09 DIAGNOSIS — C109 Malignant neoplasm of oropharynx, unspecified: Secondary | ICD-10-CM | POA: Diagnosis not present

## 2023-08-09 DIAGNOSIS — G523 Disorders of hypoglossal nerve: Secondary | ICD-10-CM | POA: Diagnosis not present

## 2023-08-09 DIAGNOSIS — C770 Secondary and unspecified malignant neoplasm of lymph nodes of head, face and neck: Secondary | ICD-10-CM | POA: Diagnosis not present

## 2023-08-09 DIAGNOSIS — R252 Cramp and spasm: Secondary | ICD-10-CM | POA: Diagnosis not present

## 2023-08-09 DIAGNOSIS — Z955 Presence of coronary angioplasty implant and graft: Secondary | ICD-10-CM | POA: Diagnosis not present

## 2023-08-09 DIAGNOSIS — Z87891 Personal history of nicotine dependence: Secondary | ICD-10-CM | POA: Diagnosis not present

## 2023-08-09 DIAGNOSIS — Z7901 Long term (current) use of anticoagulants: Secondary | ICD-10-CM | POA: Diagnosis not present

## 2023-08-09 NOTE — Telephone Encounter (Signed)
   Pre-operative Risk Assessment    Patient Name: Kenneth Gilmore  DOB: Jun 27, 1951 MRN: [id number]      Request for Surgical Clearance    Procedure:   Lip split mandibulotomy, rt oralpharyncotomy, right neck dissection, free tissue transfer and possible skin graft  Date of Surgery:  Clearance TBD                                 Surgeon:  Dr. Karie Soda Group or Practice Name:  [facility] Otol[facility] Neck Surgery Phone number:  [phone] Fax number:  [phone]   Type of Clearance Requested:   - Pharmacy:  Hold Clopidogrel (Plavix) Xarelto and Eliquis   Type of Anesthesia:  Not Indicated   Additional requests/questions:    [name]   08/09/2023, 2:09 PM

## 2023-08-11 ENCOUNTER — Other Ambulatory Visit: Payer: Self-pay

## 2023-08-11 ENCOUNTER — Other Ambulatory Visit: Payer: Self-pay | Admitting: Hospice and Palliative Medicine

## 2023-08-11 ENCOUNTER — Telehealth: Payer: Self-pay

## 2023-08-11 DIAGNOSIS — C109 Malignant neoplasm of oropharynx, unspecified: Secondary | ICD-10-CM

## 2023-08-11 NOTE — Telephone Encounter (Signed)
Patient with diagnosis of aflutter on Eliquis for anticoagulation.    Procedure:  Lip split mandibulotomy, rt oralpharyncotomy, right neck dissection, free tissue transfer and possible skin graft  Date of procedure: TBD   CHA2DS2-VASc Score = 5   This indicates a 7.2% annual risk of stroke. The patient's score is based upon: CHF History: 1 HTN History: 1 Diabetes History: 1 Stroke History: 0 Vascular Disease History: 1 Age Score: 1 Gender Score: 0      Patient with hx of mural thrombus that has resolved  CrCl 90.55 ml/min  Per office protocol, patient can hold Eliquis for 2 days prior to procedure.    **This guidance is not considered finalized until pre-operative APP has relayed final recommendations.**

## 2023-08-11 NOTE — Telephone Encounter (Signed)
-----   Message from [name] sent at 08/11/2023 12:46 PM EST ----- Please let pt know that I have discussed the case on tumor board.  Recommend to repeat PET in 3 months prior to lab MD cbc cmp TSH. Thanks. Please arrange.   zy

## 2023-08-11 NOTE — Telephone Encounter (Signed)
Dr. Kirke Corin  We have received a surgical clearance request for Kenneth Gilmore for split mandibulotomy and oral pharyncotomy with radical neck dissection.  He has a PMH of PMH notable for PVCs, NSVT, atrial flutter, HFrEF, ICM, CAD s/p PCI (2003, 2024 with severe in-stent restenosis of the mid left circumflex and severe calcified stenosis proximally with CTO of mid LAD stent with left to left collaterals), LV mural thrombus, stage 4 oropharyngeal cancer.Can you please offer guidance on holding his Plavix for scheduled procedure. Please forward you guidance and recommendations to P CV DIV PREOP   Thanks, Robin Searing, NP

## 2023-08-11 NOTE — Progress Notes (Signed)
Discussed with SLP. Patient reportedly emotional after meeting with [facility]. Will place referral to [facility].

## 2023-08-11 NOTE — Telephone Encounter (Signed)
Called and spoke to pt. Pt reported that he saw Dr. Willeen Cass and was referred to [facility] oncology. He had  a scan on Tuesday 12/17. Dr. Cathie Hoops has reviewed ENT/ onc notes and recommends for follow up in 3 months. No need for PET, for now.   Pt informed of plan and verbalized understanding. He is aware of appt details.

## 2023-08-11 NOTE — Telephone Encounter (Deleted)
   Patient Name: Kenneth Gilmore  DOB: 1951-01-07 MRN: [id number]  Primary Cardiologist: [name], MD  Clinical pharmacists have reviewed the patient's past medical history, labs, and current medications as part of preoperative protocol coverage. The following recommendations have been made:   Per office protocol, patient can hold Eliquis for 2 days prior to procedure.    I will route this recommendation to the requesting party via Epic fax function and remove from pre-op pool.  Please call with questions.  Napoleon Form, Leodis Rains, NP 08/11/2023, 3:15 PM

## 2023-08-12 NOTE — Telephone Encounter (Signed)
   Patient Name: Kenneth Gilmore  DOB: 1951-06-19 MRN: [id number]  Primary Cardiologist: [name], MD  Clinical pharmacists have reviewed the patient's past medical history, labs, and current medications as part of preoperative protocol coverage. The following recommendations have been made:  Please see guidance below per Dr.  Kirke Corin regarding holding Plavix for patient's procedure.  Unfortunately, given extensive stenting, he is at high risk for stent thrombosis without antiplatelet medications. If the surgery is not urgent, I recommend waiting until May of 2025 before holding Plavix. If the surgery is urgent, Plavix can be held 5 days before but should initiate aspirin 81 mg once daily once Plavix is held.    Per office protocol, patient can hold Eliquis for 2 days prior to procedure.     I will route this recommendation to the requesting party via Epic fax function and remove from pre-op pool.  Please call with questions.  Napoleon Form, Leodis Rains, NP 08/12/2023, 10:50 AM

## 2023-08-12 NOTE — Telephone Encounter (Signed)
Unfortunately, given extensive stenting, he is at high risk for stent thrombosis without antiplatelet medications.  If the surgery is not urgent, I recommend waiting until May of 2025 before holding Plavix.  If the surgery is urgent, Plavix can be held 5 days before but should initiate aspirin 81 mg once daily once Plavix is held.

## 2023-08-14 DIAGNOSIS — C051 Malignant neoplasm of soft palate: Secondary | ICD-10-CM | POA: Diagnosis not present

## 2023-08-14 DIAGNOSIS — R591 Generalized enlarged lymph nodes: Secondary | ICD-10-CM | POA: Diagnosis not present

## 2023-08-14 DIAGNOSIS — C059 Malignant neoplasm of palate, unspecified: Secondary | ICD-10-CM | POA: Diagnosis not present

## 2023-08-19 DIAGNOSIS — R600 Localized edema: Secondary | ICD-10-CM | POA: Diagnosis not present

## 2023-08-19 DIAGNOSIS — K5730 Diverticulosis of large intestine without perforation or abscess without bleeding: Secondary | ICD-10-CM | POA: Diagnosis not present

## 2023-08-19 DIAGNOSIS — C059 Malignant neoplasm of palate, unspecified: Secondary | ICD-10-CM | POA: Diagnosis not present

## 2023-08-19 DIAGNOSIS — I70203 Unspecified atherosclerosis of native arteries of extremities, bilateral legs: Secondary | ICD-10-CM | POA: Diagnosis not present

## 2023-08-19 DIAGNOSIS — K4090 Unilateral inguinal hernia, without obstruction or gangrene, not specified as recurrent: Secondary | ICD-10-CM | POA: Diagnosis not present

## 2023-08-29 ENCOUNTER — Inpatient Hospital Stay: Payer: Medicare Other | Attending: Oncology | Admitting: Hospice and Palliative Medicine

## 2023-08-29 ENCOUNTER — Encounter: Payer: Self-pay | Admitting: Hospice and Palliative Medicine

## 2023-08-29 VITALS — BP 107/73 | HR 65 | Temp 98.6°F | Resp 20 | Wt 167.0 lb

## 2023-08-29 DIAGNOSIS — Z923 Personal history of irradiation: Secondary | ICD-10-CM | POA: Diagnosis not present

## 2023-08-29 DIAGNOSIS — Z9221 Personal history of antineoplastic chemotherapy: Secondary | ICD-10-CM | POA: Diagnosis not present

## 2023-08-29 DIAGNOSIS — C109 Malignant neoplasm of oropharynx, unspecified: Secondary | ICD-10-CM | POA: Diagnosis not present

## 2023-08-29 DIAGNOSIS — C770 Secondary and unspecified malignant neoplasm of lymph nodes of head, face and neck: Secondary | ICD-10-CM | POA: Insufficient documentation

## 2023-08-29 DIAGNOSIS — K9429 Other complications of gastrostomy: Secondary | ICD-10-CM

## 2023-08-29 MED ORDER — MUPIROCIN 2 % EX OINT: TOPICAL_OINTMENT | Freq: Every day | CUTANEOUS | 0 refills | Status: AC

## 2023-08-29 NOTE — Progress Notes (Signed)
 Symptom Management Clinic [facility] at [facility] [phone] [phone]  Patient Care Team: Center, [facility] as PCP - General (General Practice) Darron Deatrice LABOR, MD as PCP - Cardiology (Cardiology) Cindie Ole DASEN, MD as PCP - Electrophysiology (Cardiology) Babara Call, MD as Consulting Physician (Oncology) Lenn Aran, MD as Consulting Physician (Radiation Oncology)   NAME OF PATIENT: Kenneth Gilmore  [id number]  1950/11/03   DATE OF VISIT: 08/29/23  REASON FOR CONSULT: [name] is a 73 y.o. 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  INTERVAL HISTORY: Patient status post PEG.  Has had excoriation around the PEG site and has been receiving weekly dressing changes by radiation oncology nurse.  Patient was sent to [facility] today to evaluate PEG site.  Patient says he has had difficulty self managing but uses the PEG 4 times daily for feedings.  He is still having difficulty swallowing.  He denies fever or chills.  Has had some bloody drainage around the PEG site.  Denies any neurologic complaints. Denies recent fevers or illnesses. Denies any easy bleeding or bruising. Denies chest pain. Denies any nausea, vomiting, constipation, or diarrhea. Denies urinary complaints. Patient offers no further specific complaints today.   PAST MEDICAL HISTORY: Past Medical History:  Diagnosis Date   CAD (coronary artery disease)    Diabetes mellitus without complication (HCC)    DVT (deep venous thrombosis) (HCC)    Frequent PVCs    HFrEF (heart failure with reduced ejection fraction) (HCC)    Hyperlipidemia    Hypertension    Mural thrombus of cardiac apex    NSVT (nonsustained ventricular tachycardia) (HCC)    Oropharyngeal cancer (HCC)     PAST SURGICAL HISTORY:  Past Surgical History:  Procedure Laterality Date   arm surgery Left     CORONARY ANGIOPLASTY WITH STENT PLACEMENT     CORONARY STENT INTERVENTION N/A 01/10/2023   Procedure: CORONARY STENT INTERVENTION;  Surgeon: Darron Deatrice LABOR, MD;  Location: [facility] INVASIVE CV LAB;  Service: Cardiovascular;  Laterality: N/A;   IR GASTROSTOMY TUBE MOD SED  01/07/2023   left side rotator cuff surgery     NECK SURGERY  05/18/2023   RIGHT/LEFT HEART CATH AND CORONARY ANGIOGRAPHY N/A 01/10/2023   Procedure: RIGHT/LEFT HEART CATH AND CORONARY ANGIOGRAPHY;  Surgeon: Darron Deatrice LABOR, MD;  Location: [facility] INVASIVE CV LAB;  Service: Cardiovascular;  Laterality: N/A;   SHOULDER SURGERY      HEMATOLOGY/ONCOLOGY HISTORY:  Oncology History  Oropharyngeal cancer (HCC)  09/28/2022 Imaging   CT soft tissue neck with contrast showed infiltrating mass in the right tongue with multiple ipsilateral nodal metastases. The primary mass measures at least 4 cm and infiltrates parapharyngeal fat, visualization of tumor extent is limited by artifact from dental amalgam.   10/28/2022 Initial Diagnosis   Squamous cell carcinoma of tongue (HCC) Patient has throat pain and was evaluated by ENT Dr.Bennett.  Physical examination showed cervical lymphadenopathy.  10/15/2022 ultrasound-guided biopsy of right cervical lymph node showed fibrous tissue, detach minute salivary gland tissue.  Granular debris's and a few dysplastic squamous epithelial cells, nondiagnostic.  10/22/2022 repeat ultrasound-guided biopsy of right cervical lymph node was positive for malignancy, metastatic squamous cell carcinoma, keratinizing, in a background of dense fibrosis and tumor necrosis.   10/28/2022 Cancer Staging   Staging form: Oral Cavity, AJCC 8th Edition - Clinical: Stage IVA (cT3, cN2a, cM0) - Signed by Babara Call, MD on 11/02/2022  11/16/2022 Imaging   PET scan showed 1. Large infiltrating right oropharyngeal mass is markedly hypermetabolic and consistent with known squamous cell carcinoma. 2. Bilateral hypermetabolic  metastatic cervical lymphadenopathy. 3. No evidence of metastatic disease involving the chest, abdomen/pelvis or bony structures.     12/09/2022 -  Chemotherapy   Patient is on Treatment Plan : HEAD/NECK Cisplatin  (40) q7d     01/03/2023 - 01/11/2023 Hospital Admission   Mr. [name] is a 73 year old male with non-insulin -dependent diabetes mellitus, hyperlipidemia, history of DVT on Xarelto , oropharyngeal squamous cell carcinoma stage IVa, who presents emergency department from oncology clinic for chief concerns of syncope event at home while he was in the shower. High sensitive troponin is 2925.  Telemetry showed atrial flutter, he was initially given diltiazem  gtt., and quickly converted to sinus. Patient also had a significant dysphagia from oropharyngeal cancer, G tube is placed on 5/17, TF started.  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  IV heparin  started, transitioned to Eliquis  5mg  BID Patient had a heart cath performed on 5/20, showed multivessel disease.  2 drug-eluting stents were placed.  Patient is placed on Plavix .     01/07/2023 Procedure   S/p gastrostomy tube.  placement.  5/22 G tube tip was broken and Luer-Lok tip was replaced in ER   04/12/2023 Imaging   PET scan 1. Response to therapy of right tongue base primary and cervical nodal metastasis. 2. New hypermetabolism within the right side of the epiglottis is nonspecific and without CT correlate. Consider direct visualization. 3. No distant metastasis identified. 4. New hypermetabolism about the C6-7 level. Possible fractures in the region of the pars, suboptimally and incompletely evaluated. If clinical concern of fracture (trauma by report on on 01/03/2023) repeat cervical spine CT recommended. If metastasis or infection are clinically suspected (felt less likely) pre and postcontrast cervical spine MRI could be performed. 5. Cholelithiasis with possible  pericholecystic edema. Correlate with right upper quadrant symptoms and consider ultrasound. 6. Incidental findings, including: Coronary artery atherosclerosis.Aortic Atherosclerosis (ICD10-I70.0). Prostatomegaly. 7. Aortic valvular calcifications. Consider echocardiography to evaluate for valvular dysfunction.   07/25/2023 Imaging   PET scan restaging showed 1. New focal area of hypermetabolism in thickened soft tissues deep to the right mandibular angle. Correlation with CT is challenging without IV contrast. Consider CT neck with contrast in further evaluation, as clinically indicated. 2. Stable residual mild uptake along the medial wall of the right oropharynx. No evidence of distant metastatic disease. 3. Enlarged prostate. 4. Aortic atherosclerosis (ICD10-I70.0). Coronary artery calcification.      ALLERGIES:  has no known allergies.  MEDICATIONS:  Current Outpatient Medications  Medication Sig Dispense Refill   apixaban  (ELIQUIS ) 5 MG TABS tablet Place 1 tablet (5 mg total) into feeding tube 2 (two) times daily. 60 tablet 3   atorvastatin  (LIPITOR ) 80 MG tablet Place 1 tablet (80 mg total) into feeding tube daily. 30 tablet 0   clopidogrel  (PLAVIX ) 75 MG tablet Place 1 tablet (75 mg total) into feeding tube daily with breakfast. 30 tablet 0   empagliflozin  (JARDIANCE ) 10 MG TABS tablet Take 1 tablet (10 mg total) by mouth daily. 30 tablet 0   fluconazole  (DIFLUCAN ) 100 MG tablet Take 1 tablet (100 mg total) by mouth daily. (Patient not taking: Reported on 07/01/2023) 10 tablet 0   Magnesium  Oxide 400 MG CAPS Place 1 capsule (400 mg total) into feeding tube daily. 90 capsule 3   metFORMIN (GLUCOPHAGE) 500 MG tablet  Take 500 mg by mouth 2 (two) times daily with a meal.     metoprolol  tartrate (LOPRESSOR ) 25 MG tablet Place 0.5 tablets (12.5 mg total) into feeding tube 2 (two) times daily. (Patient not taking: Reported on 07/01/2023) 60 tablet 0   mexiletine (MEXITIL) 150 MG capsule  Place 1 capsule (150 mg total) into feeding tube 3 (three) times daily. 270 capsule 3   Nutritional Supplements (FEEDING SUPPLEMENT, OSMOLITE 1.5 CAL,) LIQD Give 2 cartons at 8am, noon, 4pm feeding via feeding tube.  Give 1 carton at 8pm via feeding tube.  Flush with 60ml water  before and after each feeding.  Flush with additional 240ml TID between feedings to better meet hydration needs. Send bolus supplies. 1659 mL 0   nystatin  (MYCOSTATIN ) 100000 UNIT/ML suspension Take 5 mLs (500,000 Units total) by mouth 4 (four) times daily. Swish and swallow 60 mL 0   oxyCODONE  (OXY IR/ROXICODONE ) 5 MG immediate release tablet Place 1 tablet (5 mg total) into feeding tube every 6 (six) hours as needed for severe pain. (Patient not taking: Reported on 07/01/2023) 30 tablet 0   Protein (FEEDING SUPPLEMENT, PROSOURCE TF20,) liquid Place 60 mLs into feeding tube 2 (two) times daily.     Water  For Irrigation, Sterile (FREE WATER ) SOLN Place 200 mLs into feeding tube 4 (four) times daily - after meals and at bedtime.     No current facility-administered medications for this visit.    VITAL SIGNS: There were no vitals taken for this visit. There were no vitals filed for this visit.   Estimated body mass index is 24.98 kg/m as calculated from the following:   Height as of 07/29/23: 5' 9 (1.753 m).   Weight as of 07/29/23: 169 lb 2 oz (76.7 kg).  LABS: CBC:    Component Value Date/Time   WBC 5.0 07/27/2023 1035   WBC 2.7 (L) 01/11/2023 0550   HGB 11.6 (L) 07/27/2023 1035   HCT 36.2 (L) 07/27/2023 1035   PLT 207 07/27/2023 1035   MCV 98.4 07/27/2023 1035   NEUTROABS 3.0 07/27/2023 1035   LYMPHSABS 1.2 07/27/2023 1035   MONOABS 0.7 07/27/2023 1035   EOSABS 0.0 07/27/2023 1035   BASOSABS 0.0 07/27/2023 1035   Comprehensive Metabolic Panel:    Component Value Date/Time   NA 140 07/27/2023 1036   NA 139 05/27/2023 0923   K 4.5 07/27/2023 1036   CL 100 07/27/2023 1036   CO2 29 07/27/2023 1036   BUN  26 (H) 07/27/2023 1036   BUN 24 05/27/2023 0923   CREATININE 0.80 07/27/2023 1036   GLUCOSE 95 07/27/2023 1036   CALCIUM  9.5 07/27/2023 1036   AST 27 07/27/2023 1036   ALT 28 07/27/2023 1036   ALKPHOS 80 07/27/2023 1036   BILITOT 0.3 07/27/2023 1036   PROT 7.4 07/27/2023 1036   PROT 6.0 05/27/2023 0923   ALBUMIN 3.9 07/27/2023 1036   ALBUMIN 3.4 (L) 05/27/2023 0923    RADIOGRAPHIC STUDIES: No results found.  PERFORMANCE STATUS (ECOG) : 1 - Symptomatic but completely ambulatory  Review of Systems Unless otherwise noted, a complete review of systems is negative.  Physical Exam General: NAD Cardiovascular: regular rate and rhythm Pulmonary: clear ant fields Abdomen: soft, nontender, + bowel sounds GU: no suprapubic tenderness Extremities: no edema, no joint deformities Skin: Excoriation inferior to the PEG site.  Some areas of clots. Neurological: Nonfocal       IMPRESSION/PLAN: Stage IVa oropharyngeal cancer -s/p concurrent chemoradiation.  Pending surgical evaluation at [facility].  PEG site-area excoriated from PEG clip.  Area cleaned.  No clear evidence of infection but will start empirically on Bactroban  for several days.  Recommended use of fenestrated dressing to relieve pressure.  Patient has limited social support at home and has had difficulty managing his PEG historically.  Will send referrals to home health and wound center for further evaluation and management.  Case and plan discussed with Dr. Babara  Patient expressed understanding and was in agreement with this plan. He also understands that He can call clinic at any time with any questions, concerns, or complaints.   Thank you for allowing me to participate in the care of this very pleasant patient.   Time Total: 20 minutes  Visit consisted of counseling and education dealing with the complex and emotionally intense issues of symptom management in the setting of serious illness.Greater than 50%  of this time was  spent counseling and coordinating care related to the above assessment and plan.  Signed by: Fonda Mower, PhD, NP-C

## 2023-08-30 ENCOUNTER — Encounter: Payer: Self-pay | Admitting: Oncology

## 2023-08-31 ENCOUNTER — Inpatient Hospital Stay: Payer: Medicare Other | Attending: Oncology

## 2023-08-31 NOTE — Progress Notes (Signed)
 Nutrition Follow-up:  Patient with stage IV oropharyngeal SCC s/p chemotherapy and radiation.  Now with recurrent disease and planning surgery at [facility] on 1/16 (tracheostomy, modified radical neck dissection, free skin flap with microvascular anastomosis.    Spoke with patient via phone for nutrition follow-up.  Patient reports that he continues to give osmolite 1.5, 12 oz 4 times a day via PEG tube and about 7 cups of water  or more daily (flush, with medications).  Not taking foods by mouth.   Receiving formula and supplies from the [place].  Received legacy syringes vs enfit and is requesting enfit syringes.     Medications: reviewed  Labs: reviewed  Anthropometrics:   Weight 167 lb noted on 1/6 166 lb on 1/3 at [facility] 167 lb 12.8 oz on 12/4 178 lb 2 oz on 10/9 176 lb on 8/21 176 lb on 8/21 176 lb on 6/27 168 lb on 5/31 190 lb on 4/25  6% weight loss in the last 2 months, concerning  Estimated Energy Needs  Kcals: 2150-2580 Protein: 108-121 g Fluid: 2100-2521ml  NUTRITION DIAGNOSIS: Unintentional weight loss related to cancer progression as evidenced by 6% weight loss in the last  2 months.   INTERVENTION:  Recommend increasing osmolite 1.5 to 7 cartons per day via feeding tube (2 cartons at first 3 feedings of the day and 1 carton at last feeding).  Will give 1 1/4 cup water  QID at feedings (flush, medication).   Provides 2485 calories, 104 g protein, 2460 ml free water .   Will send message to [facility] RD regarding needing enfit syringes.  Will leave enfit syringes at front desk for patient to pick up today.  If continues on 7 cartons of osmolite 1.5 following surgery will need to [facility].  Patient verbalizes understanding.  Continue coordination of care with [facility]    MONITORING, EVALUATION, GOAL: weight trends, tube feeding   NEXT VISIT: Wed, March 19 in clinic following MD visit  [name] B. Dasie, RD, LDN Registered Dietitian [phone]

## 2023-09-21 ENCOUNTER — Telehealth: Payer: Self-pay | Admitting: Gastroenterology

## 2023-09-21 NOTE — Telephone Encounter (Signed)
The patient sister Jennette Kettle) called in and left a voicemail to cancel her bother appointment because he is in the hospital. I called her back to let her know that I received her message, and I asked her if she wanted to reschedule now since she is booked until March. She said no because he has surgery, and they have more test to do.

## 2023-09-26 ENCOUNTER — Ambulatory Visit: Payer: Medicare Other | Admitting: Physician Assistant

## 2023-09-29 ENCOUNTER — Ambulatory Visit: Payer: Medicare Other | Admitting: Cardiology

## 2023-10-06 ENCOUNTER — Ambulatory Visit: Payer: Medicare Other | Admitting: Physician Assistant

## 2023-10-27 ENCOUNTER — Telehealth: Payer: Self-pay | Admitting: Oncology

## 2023-10-27 NOTE — Telephone Encounter (Signed)
 Patients sister called to cancel appointments. He is in ICU at [facility]. She states if he gets to come home they will call to reschedule.  Appointments cancelled as requested

## 2023-11-09 ENCOUNTER — Other Ambulatory Visit: Payer: Medicare Other

## 2023-11-09 ENCOUNTER — Ambulatory Visit: Payer: Medicare Other | Admitting: Oncology

## 2024-01-09 NOTE — Nursing Note (Signed)
 Patient has been discharged to [facility] as ordered. Transported out in no acute distress. Vital signs HR 69 BP 106/85, Temp at 98.50F. IV access to right AC discontinued. PEG tube stopped and line flushed.

## 2024-01-09 NOTE — Nursing Note (Signed)
 Patient is resting on bed in no distress. Has Discharge order. AVS printed and is awaiting [facility] to go to [place]. Vitals stable. . Patient was educated, counselled and all questions answered. Patient updated.

## 2024-01-25 ENCOUNTER — Encounter: Payer: Self-pay | Admitting: Oncology

## 2024-01-25 NOTE — Progress Notes (Signed)
 After multiple attempts, we have been unable to reach this patient to enroll in services.  Referral closed on 01/23/2024.  [facility] remains available should the patient express interest in the future.

## 2024-01-30 ENCOUNTER — Ambulatory Visit: Payer: Medicare Other | Admitting: Radiation Oncology

## 2024-03-03 ENCOUNTER — Other Ambulatory Visit: Payer: Self-pay

## 2024-03-10 ENCOUNTER — Other Ambulatory Visit: Payer: Self-pay

## 2024-03-26 NOTE — Discharge Summary (Signed)
 ------------------------------------------------------------------------------- Attestation signed by Deatrice Pamula Olmsted, MD at 03/26/2024  4:13 PM I attest that I have seen and examined the patient.  I have discussed the patient's presentation and plan of care with the resident. I agree with the history, physical, medical decision making, and plan as documented. I have reviewed their documentation and agree with the plan of care.  Code: GC modifier  PRIMARY DISCHARGE DIAGNOSIS #.  Bleeding from tracheostomy site, resolved  SECONDARY DISCHARGE DIAGNOSIS #. Squamous cell carcinoma of the oropharynx s/p radiation therapy status post PEG tube placement  #. Paroxysmal atrial fibrillation  #.  Coronary artery disease #.  Heart failure with reduced ejection fraction #.  Hypertension #.  Type 2 diabetes mellitus  BRIEF HOSPITAL COURSE Kenneth Gilmore is a 73 y.o. male who is a long term resident [address] of [place] with history of atrial fibrillation on Eliquis , oropharyngeal cancer, s/p tracheostomy and PEG tube, diabetes, HTN, CAD, HFrEF (EF 30%) presenting with pink tinged secretion for the past couple days.   CT neck reports submucosal soft tissue thickening/edema of the right oropharynx/retropharynx. No evidence of abscess or fluid collection. Tracheostomy is appropriately positioned. NT consulted and plan for flexible laryngoscope in ED, tracheostomy exchanged. Copious mucus without obstruction or lesions noted on laryngoscopy. Labs not consistent with bacterial infection or pneumonia as procalcitonin nonelevated, no leukocytosis or fever. Biofire additionally negative. Initiated on hypertonic saline and supportive care to assist with mucus clearance, but no indication for antibiotics at this time, and he continues to saturate well on 28% FiO2.  Respiratory therapist evaluated the patient, patient on 6 L / 28% trach collar, trach was changed by ENT and now patient has size 7 Portex  trach.  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] Blood-tinged sputum [R04.2] Discharge Date: 03/26/2024  Problem List: Principal Problem: Tracheostomy site secretions Active Problems:   Tracheostomy dependent (CMS/HCC)   PEG (percutaneous endoscopic gastrostomy) status (CMS/HCC)     Discharge Diagnosis: Tracheostomy blood mixed secretions, infection ruled out Squamous cell carcinoma of oropharynx status post radiation therapy Status post PEG tube placement Paroxysmal atrial fibrillation (CMS/HCC) Coronary artery disease Diabetes mellitus (CMS/HCC) Hypertension  Home Health needed: No Patient Condition: Stable to be discharged back to [facility] Course:  Kenneth Gilmore is a 73 year old male, long-term resident of [facility] [place] with history of atrial fibrillation on Eliquis , oropharyngeal cancer status post tracheostomy and PEG tube, diabetes, hypertension, coronary artery disease, HFrEF presented with pink-tinged secretions for the past couple of days.  He also endorsed productive cough.  The tracheostomy site did not look infected.  CT neck showed submucosal soft tissue thickening/edema of the right oropharynx/retropharynx with no evidence of abscess or fluid collection on admission.  Tracheostomy was appropriately positioned.  ENT was consulted and a flexible laryngoscope was done in the ED which showed no further signs of bleeding.  He initially had lactic acidosis  which improved with fluids.  Lab work were not consistent with any infection.  No concerns for fever, leukocytosis, no elevation in procalcitonin.  Bio fire was negative.  Patient received nebulization treatment and suctioning with RT.  He received hypertonic saline nebulization to help improve mucus clearance.  During his hospital stay his PEG tube feeding was continued.  He is stable to be discharged to [facility]   Procedures Performed: Fiberoptic laryngoscope, tracheostomy tube was changed    Discharge Medications:   Your medication list     CONTINUE taking these medications      Instructions Last Dose Given Next Dose Due  acetaminophen  325 mg tablet Commonly known as: TYLENOL     TWO tablets by g-tube route every 6 (six) hours as needed for mild pain (1-3).     apixaban  5 mg tablet Commonly known as: ELIQUIS     ONE tablet by g-tube route 2 (two) times a day.     atorvastatin  80 mg tablet Commonly known as: LIPITOR     ONE tablet by g-tube route every night.     clopidogreL  75 mg tablet Commonly known as: PLAVIX     ONE tablet by g-tube route 1 (one) time each day.     dapagliflozin propanediol 10 mg tablet tablet Commonly known as: FARXIGA    Take ONE tablet by mouth 1 (one) time each day.     furosemide 40 mg tablet Commonly known as: LASIX    ONE tablet by g-tube route 1 (one) time each day.     glycopyrrolate 1 mg tablet Commonly known as: ROBINUL    Take ONE tablet by mouth 3 (three) times a day.     hyoscyamine 0.125 mg tablet Commonly known as: LEVSIN    ONE tablet by g-tube route every 12 (twelve) hours.     lisinopriL 5 mg tablet Commonly known as: ZESTRIL    Take ONE tablet by mouth 1 (one) time each day.     magnesium  oxide 400 mg magnesium  tablet tablet Commonly known as: MAG-OX    Take ONE tablet by mouth 1 (one) time each day.     melatonin 5 mg tablet    ONE tablet by g-tube route every night.     metoprolol  tartrate 25 mg  tablet Commonly known as: LOPRESSOR     Take HALF tablet by mouth 2 (two) times a day for 85 doses.     mexiletine 200 mg capsule Commonly known as: MEXITIL    ONE capsule by g-tube route every 8 (eight) hours. Hold for heart rate less than 60     sertraline 25 mg tablet Commonly known as: ZOLOFT    Take ONE tablet by mouth 1 (one) time each day.         Discharge Medications   None     Last Recorded Vitals Blood pressure 100/52, pulse 65, temperature 36.3 C (97.3 F), temperature source Oral, resp. rate 13, height 1.778 m (5' 10), weight 70.9 kg (156 lb 4.9 oz), SpO2 99%.  Pertinent Physical Exam At Time of Discharge: Physical Exam Physical Exam Constitutional:      Appearance: Normal appearance.     Comments: Tracheostomy and PEG tube in place  HENT:     Head: Normocephalic and atraumatic.     Nose: Nose normal.     Mouth/Throat:     Pharynx: Oropharynx is clear.  Eyes:     Extraocular Movements: Extraocular movements intact.     Pupils: Pupils are equal, round, and reactive to light.  Neck:     Comments: Tracheostomy in place with mild secretions Cardiovascular:     Rate and Rhythm: Normal rate and regular rhythm.     Pulses: Normal pulses.  Heart sounds: Normal heart sounds.  Pulmonary:     Effort: Pulmonary effort is normal.     Breath sounds: Normal breath sounds.  Abdominal:     General: Bowel sounds are normal.     Palpations: Abdomen is soft.     Comments: PEG tube in place, site appears clean, dry   Musculoskeletal:        General: Normal range of motion.     Cervical back: Normal range of motion and neck supple.  Skin:    General: Skin is warm.     Capillary Refill: Capillary refill takes less than 2 seconds.  Neurological:     General: No focal deficit present.     Mental Status: He is alert and oriented to person, place, and time.  Psychiatric:        Mood and Affect: Mood normal.        Behavior: Behavior normal.    Issues Requiring  Follow-Up:  Squamous cell carcinoma of oropharynx status post radiation therapy  Status post tracheostomy and PEG tube -Patient did not have any concerns for tracheostomy site infection this admission. - His tracheostomy tube was changed from size 6 [place]iley to size 7 Portex. - Will be discharged back to [facility] with tracheostomy supplies - Follow-up with [facility] ENT as scheduled  Paroxysmal atrial fibrillation - Chronic established diagnosis -Continue home medication Eliquis  and mexiletine   Coronary artery disease Heart failure with reduced ejection fraction Hypertension - Continue home medication Lasix, Plavix , Farxiga, metoprolol , lisinopril, atorvastatin    Type 2 diabetes mellitus - A1c 7.3 -Home medication Farxiga -Sliding scale insulin  while inpatient, hypoglycemia protocol in place  Follow-up with primary care physician for further management of chronic medical conditions   Outpatient Follow-Up:  Contact information for after-discharge care     Destination     [facility] .   Service: Skilled Nursing Contact information: [address] [place]  [id number] [phone]                      Total time spent on discharge: 35 minutes

## 2024-03-27 NOTE — Care Plan (Signed)
  Problem: Skin/Tissue Integrity - Adult Goal: Skin integrity remains intact Outcome: Completed Goal: Incisions, wounds, or drain sites healing without S/S of infection Outcome: Completed Goal: Oral mucous membranes remain intact Outcome: Completed   Problem: Psychosocial Needs Goal: Demonstrates ability to cope with hospitalization/illness Outcome: Completed Goal: Collaborate with patient/family/caregiver to identify patient specific goals for this hospitalization Outcome: Completed   Problem: Pain - Adult Goal: Verbalizes/displays pain/discomfort to be at an acceptable level and exhibits diminished signs/symptoms of pain Outcome: Completed   Problem: Infection - Adult Goal: To reduce/correct existing risk factors Outcome: Completed   Problem: Safety Adult - Fall Goal: Patient will be free from fall injury Outcome: Completed   Problem: Discharge Planning Goal: Patient will be discharged to home or other facility with appropriate resources Outcome: Completed   Problem: Chronic Conditions and Co-morbidities Goal: Patient's chronic conditions and co-morbidity symptoms are monitored and maintained or improved Outcome: Completed   Problem: Respiratory - Adult Goal: Achieves optimal ventilation and oxygenation Outcome: Completed   Problem: Knowledge Deficit Goal: Patient/family/caregiver demonstrates understanding of disease process, treatment plan, medications, and discharge instructions Outcome: Completed   Problem: Potential for Compromised Skin Integrity Goal: Skin Integrity is Maintained or Improved Outcome: Completed Goal: Nutritional status is improving Outcome: Completed   Problem: Urinary Incontinence Goal: Perineal skin integrity is maintained or improved Outcome: Completed

## 2024-04-20 ENCOUNTER — Other Ambulatory Visit: Payer: Self-pay

## 2024-05-09 ENCOUNTER — Other Ambulatory Visit: Payer: Self-pay

## 2024-07-13 ENCOUNTER — Other Ambulatory Visit: Payer: Self-pay

## 2024-08-23 DEATH — deceased
